# Patient Record
Sex: Male | Born: 1940 | Race: White | Hispanic: No | Smoking: Former smoker
Health system: Southern US, Community
[De-identification: ages and names within clinical notes are randomized; demographics above are authoritative.]

## PROBLEM LIST (undated history)

## (undated) DIAGNOSIS — M8580 Other specified disorders of bone density and structure, unspecified site: Secondary | ICD-10-CM

## (undated) DIAGNOSIS — I10 Essential (primary) hypertension: Secondary | ICD-10-CM

## (undated) DIAGNOSIS — K635 Polyp of colon: Secondary | ICD-10-CM

## (undated) DIAGNOSIS — K219 Gastro-esophageal reflux disease without esophagitis: Secondary | ICD-10-CM

## (undated) DIAGNOSIS — I714 Abdominal aortic aneurysm, without rupture, unspecified: Secondary | ICD-10-CM

## (undated) DIAGNOSIS — T7840XA Allergy, unspecified, initial encounter: Secondary | ICD-10-CM

## (undated) DIAGNOSIS — I2584 Coronary atherosclerosis due to calcified coronary lesion: Secondary | ICD-10-CM

## (undated) DIAGNOSIS — I771 Stricture of artery: Secondary | ICD-10-CM

## (undated) DIAGNOSIS — I2510 Atherosclerotic heart disease of native coronary artery without angina pectoris: Secondary | ICD-10-CM

## (undated) DIAGNOSIS — M1990 Unspecified osteoarthritis, unspecified site: Secondary | ICD-10-CM

## (undated) DIAGNOSIS — L309 Dermatitis, unspecified: Secondary | ICD-10-CM

## (undated) HISTORY — PX: TRANSURETHRAL RESECTION OF PROSTATE: SHX73

## (undated) HISTORY — DX: Dermatitis, unspecified: L30.9

## (undated) HISTORY — DX: Abdominal aortic aneurysm, without rupture, unspecified: I71.40

## (undated) HISTORY — DX: Abdominal aortic aneurysm, without rupture: I71.4

## (undated) HISTORY — DX: Polyp of colon: K63.5

## (undated) HISTORY — DX: Other specified disorders of bone density and structure, unspecified site: M85.80

## (undated) HISTORY — PX: TONSILLECTOMY: SUR1361

## (undated) HISTORY — DX: Gastro-esophageal reflux disease without esophagitis: K21.9

## (undated) HISTORY — DX: Allergy, unspecified, initial encounter: T78.40XA

## (undated) HISTORY — DX: Atherosclerotic heart disease of native coronary artery without angina pectoris: I25.10

## (undated) HISTORY — PX: MASS EXCISION: SHX2000

## (undated) HISTORY — DX: Unspecified osteoarthritis, unspecified site: M19.90

## (undated) HISTORY — DX: Stricture of artery: I77.1

## (undated) HISTORY — PX: CATARACT EXTRACTION: SUR2

## (undated) HISTORY — DX: Essential (primary) hypertension: I10

## (undated) HISTORY — DX: Coronary atherosclerosis due to calcified coronary lesion: I25.84

---

## 2006-06-08 ENCOUNTER — Ambulatory Visit: Payer: Self-pay | Admitting: Gastroenterology

## 2006-06-28 ENCOUNTER — Ambulatory Visit: Payer: Self-pay | Admitting: Gastroenterology

## 2006-06-28 ENCOUNTER — Encounter (INDEPENDENT_AMBULATORY_CARE_PROVIDER_SITE_OTHER): Payer: Self-pay | Admitting: Specialist

## 2009-06-22 ENCOUNTER — Encounter (INDEPENDENT_AMBULATORY_CARE_PROVIDER_SITE_OTHER): Payer: Self-pay | Admitting: *Deleted

## 2010-01-07 ENCOUNTER — Encounter (INDEPENDENT_AMBULATORY_CARE_PROVIDER_SITE_OTHER): Payer: Self-pay | Admitting: *Deleted

## 2010-01-13 ENCOUNTER — Encounter (INDEPENDENT_AMBULATORY_CARE_PROVIDER_SITE_OTHER): Payer: Self-pay

## 2010-01-15 ENCOUNTER — Ambulatory Visit: Payer: Self-pay | Admitting: Gastroenterology

## 2010-01-22 ENCOUNTER — Ambulatory Visit: Payer: Self-pay | Admitting: Gastroenterology

## 2010-01-27 ENCOUNTER — Encounter: Payer: Self-pay | Admitting: Gastroenterology

## 2010-06-20 HISTORY — PX: OTHER SURGICAL HISTORY: SHX169

## 2010-07-20 NOTE — Miscellaneous (Signed)
Summary: Lec previsit  Clinical Lists Changes  Medications: Added new medication of MOVIPREP 100 GM  SOLR (PEG-KCL-NACL-NASULF-NA ASC-C) As per prep instructions. - Signed Rx of MOVIPREP 100 GM  SOLR (PEG-KCL-NACL-NASULF-NA ASC-C) As per prep instructions.;  #1 x 0;  Signed;  Entered by: Ulis Rias RN;  Authorized by: Meryl Dare MD [facility];  Method used: [facility]  [id number]*, [address], [place], [place]  [address], Ph: [phone], Fax: [phone] Observations: Added new observation of NKA: T (01/15/2010 12:54)    Prescriptions: MOVIPREP 100 GM  SOLR (PEG-KCL-NACL-NASULF-NA ASC-C) As per prep instructions.  #1 x 0   Entered by:   Ulis Rias RN   Authorized by:   Meryl Dare MD [facility]   Signed by:   Ulis Rias RN on 01/15/2010   Method used:   [facility]ronically to        [facility]  [phone]* (retail)       [address] [place], [place]  [address]       Ph: [phone]       Fax: [phone]   RxID:   [id number]

## 2010-07-20 NOTE — Procedures (Signed)
Summary: Colonoscopy  Patient: [name] Note: All result statuses are Final unless otherwise noted.  Tests: (1) Colonoscopy (COL)   COL Colonoscopy           DONE     [facility]     [address]     [place], [place]  [address]           COLONOSCOPY PROCEDURE REPORT           PATIENT:  Jeffrey [name], Jeffrey [name]  MR#:  [id number]     BIRTHDATE:  09/17/1940, 68 yrs. old  GENDER:  male     ENDOSCOPIST:  Judie Petit T. 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     MEDICATIONS:   Fentanyl 75 mcg IV, Versed 6 mg IV     DESCRIPTION OF PROCEDURE:   After the risks benefits and     alternatives of the procedure were thoroughly explained, informed     consent was obtained.  Digital rectal exam was performed and     revealed no abnormalities.   The LB PCF-H180AL [id number] endoscope     was introduced through the anus and advanced to the cecum, which     was identified by both the appendix and ileocecal valve, without     limitations.  The quality of the prep was excellent, using     MoviPrep.  The instrument was then slowly withdrawn as the colon     was fully examined.     <<PROCEDUREIMAGES>>     FINDINGS:  A sessile polyp was found in the cecum. It was 5 mm in     size. Polyp was snared without cautery. Retrieval was successful.     A sessile polyp was found in the mid transverse colon. It was 4 mm     in size. The polyp was removed using cold biopsy forceps.  Mild     diverticulosis was found transverse colon to sigmoid colon. This     was otherwise a normal examination of the colon. Retroflexed views     in the rectum revealed no abnormalities.    The time to cecum =     1.75  minutes. The scope was then withdrawn (time =  9.33  min)     from the patient and the procedure  completed.           COMPLICATIONS:  None           ENDOSCOPIC IMPRESSION:     1) 5 mm sessile polyp in the cecum     2) 4 mm sessile polyp in the mid transverse colon     3) Mild diverticulosis transverse colon to sigmoid colon           RECOMMENDATIONS:     1) High fiber diet with liberal     fluid intake     2) Repeat colonoscopy in 5 years     pending pathology review           Malcolm T. Russella Dar, MD, [facility]           n.     eSIGNED:   Venita Lick. Stark at 01/22/2010 10:56 AM  Maikel, [place], [id number]  Note: An exclamation mark (!) indicates a result that was not dispersed into the flowsheet. Document Creation Date: 01/22/2010 10:58 AM _______________________________________________________________________  (1) Order result status: Final Collection or observation date-time: 01/22/2010 10:48 Requested date-time:  Receipt date-time:  Reported date-time:  Referring Physician:   Ordering Physician: Claudette Head [phone]) Specimen Source:  Source: Launa Grill Order Number: [id number] Lab site:   Appended Document: Colonoscopy     Procedures Next Due Date:    Colonoscopy: 01/2015

## 2010-07-20 NOTE — Letter (Signed)
Summary: Patient Notice- Polyp Results  [facility]  [address] [place], [place] [address]   Phone: [phone]  Fax: [phone]        January 27, 2010 MRN: [id number]    [facility] [address] [place], [place]  [address]    Dear Mr. Wimbish,  I am pleased to inform you that the colon polyp(s) removed during your recent colonoscopy was (were) found to be benign (no cancer detected) upon pathologic examination.  I recommend you have a repeat colonoscopy examination in 5 years to look for recurrent polyps, as having colon polyps increases your risk for having recurrent polyps or even colon cancer in the future.  Should you develop new or worsening symptoms of abdominal pain, bowel habit changes or bleeding from the rectum or bowels, please schedule an evaluation with either your primary care physician or with me.  Continue treatment plan as outlined the day of your exam.  Please call us if you are having persistent problems or have questions about your condition that have not been fully answered at this time.  Sincerely,  Meryl Dare MD [facility]  This letter has been electronically signed by your physician.  Appended Document: Patient Notice- Polyp Results letter mailed

## 2010-07-20 NOTE — Letter (Signed)
Summary: Previsit letter  [facility]  [address] [place], [place] [address]   Phone: [phone]  Fax: [phone]       01/07/2010 MRN: [id number]  Jeffrey [name] [address] [place], [place]  [address]  Dear Mr. [name],  Welcome to the Gastroenterology Division at [facility].    You are scheduled to see a nurse for your pre-procedure visit on 01-15-10 at 1:30pm on the 3rd floor at [facility], [address]  We ask that you try to arrive at our office 15 minutes prior to your appointment time to allow for check-in.  Your nurse visit will consist of discussing your medical and surgical history, your immediate family medical history, and your medications.    Please bring a complete list of all your medications or, if you prefer, bring the medication bottles and we will list them.  We will need to be aware of both prescribed and over the counter drugs.  We will need to know exact dosage information as well.  If you are on blood thinners (Coumadin, Plavix, Aggrenox, Ticlid, etc.) please call our office today/prior to your appointment, as we need to consult with your physician about holding your medication.   Please be prepared to read and sign documents such as consent forms, a financial agreement, and acknowledgement forms.  If necessary, and with your consent, a friend or relative is welcome to sit-in on the nurse visit with you.  Please bring your insurance card so that we may make a copy of it.  If your insurance requires a referral to see a specialist, please bring your referral form from your primary care physician.  No co-pay is required for this nurse visit.     If you cannot keep your appointment, please call [phone] to cancel or reschedule prior to your appointment date.  This allows [place] the opportunity to schedule an appointment for another patient in need of care.    Thank you for choosing [facility] for your medical needs.  We  appreciate the opportunity to care for you.  Please visit [place] at our website  to learn more about our practice.                     Sincerely.                                                                                                                   The Gastroenterology Division

## 2010-07-20 NOTE — Letter (Signed)
Summary: [facility] Instructions  [facility]  [address] [place], [place] [address]   Phone: [phone]  Fax: [phone]       JASMON GRAFFAM    [birth year]    MRN: [id number]        Procedure Day /Date: Friday 01-22-10     Arrival Time: 10:00 a.m.     Procedure Time: 11:00 a.m.     Location of Procedure:                    _x_  [facility] (4th Floor)   PREPARATION FOR COLONOSCOPY WITH MOVIPREP   Starting 5 days prior to your procedure  01-17-10 do not eat nuts, seeds, popcorn, corn, beans, peas,  salads, or any raw vegetables.  Do not take any fiber supplements (e.g. Metamucil, Citrucel, and Benefiber).  THE DAY BEFORE YOUR PROCEDURE         DATE:  01-21-10  DAY: Thursday  1.  Drink clear liquids the entire day-NO SOLID FOOD  2.  Do not drink anything colored red or purple.  Avoid juices with pulp.  No orange juice.  3.  Drink at least 64 oz. (8 glasses) of fluid/clear liquids during the day to prevent dehydration and help the prep work efficiently.  CLEAR LIQUIDS INCLUDE: Water Jello Ice Popsicles Tea (sugar ok, no milk/cream) Powdered fruit flavored drinks Coffee (sugar ok, no milk/cream) Gatorade Juice: apple, white grape, white cranberry  Lemonade Clear bullion, consomm, broth Carbonated beverages (any kind) Strained chicken noodle soup Hard Candy                             4.  In the morning, mix first dose of MoviPrep solution:    Empty 1 Pouch A and 1 Pouch B into the disposable container    Add lukewarm drinking water to the top line of the container. Mix to dissolve    Refrigerate (mixed solution should be used within 24 hrs)  5.  Begin drinking the prep at 5:00 p.m. The MoviPrep container is divided by 4 marks.   Every 15 minutes drink the solution down to the next mark (approximately 8 oz) until the full liter is complete.   6.  Follow completed prep with 16 oz of clear liquid of your choice (Nothing red or purple).   Continue to drink clear liquids until bedtime.  7.  Before going to bed, mix second dose of MoviPrep solution:    Empty 1 Pouch A and 1 Pouch B into the disposable container    Add lukewarm drinking water to the top line of the container. Mix to dissolve    Refrigerate  THE DAY OF YOUR PROCEDURE      DATE:  01-22-10  DAY: Friday  Beginning at  6:00 a.m. (5 hours before procedure):         1. Every 15 minutes, drink the solution down to the next mark (approx 8 oz) until the full liter is complete.  2. Follow completed prep with 16 oz. of clear liquid of your choice.    3. You may drink clear liquids until  9:00 a.m. (2 HOURS BEFORE PROCEDURE).   MEDICATION INSTRUCTIONS  Unless otherwise instructed, you should take regular prescription medications with a small sip of water   as early as possible the morning of your procedure.         OTHER INSTRUCTIONS  You will need  a responsible adult at least 70 years of age to accompany you and drive you home.   This person must remain in the waiting room during your procedure.  Wear loose fitting clothing that is easily removed.  Leave jewelry and other valuables at home.  However, you may wish to bring a book to read or  an iPod/MP3 player to listen to music as you wait for your procedure to start.  Remove all body piercing jewelry and leave at home.  Total time from sign-in until discharge is approximately 2-3 hours.  You should go home directly after your procedure and rest.  You can resume normal activities the  day after your procedure.  The day of your procedure you should not:   Drive   Make legal decisions   Operate machinery   Drink alcohol   Return to work  You will receive specific instructions about eating, activities and medications before you leave.    The above instructions have been reviewed and explained to me by   Ulis Rias RN  January 15, 2010 2:04 PM _    I fully understand and can verbalize  these instructions _____________________________ Date _________

## 2010-07-20 NOTE — Letter (Signed)
Summary: Colonoscopy Letter  [facility]  [address] [place], [place] [address]   Phone: [phone]  Fax: [phone]      June 22, 2009 MRN: [id number]   Jeffrey [name] [address] [place], [place]  [address]   Dear [name],   According to your medical record, it is time for you to schedule a Colonoscopy. The American Cancer Society recommends this procedure as a method to detect early colon cancer. Patients with a family history of colon cancer, or a personal history of colon polyps or inflammatory bowel disease are at increased risk.  This letter has beeen generated based on the recommendations made at the time of your procedure. If you feel that in your particular situation this may no longer apply, please contact our office.  Please call our office at [phone] to schedule this appointment or to update your records at your earliest convenience.  Thank you for cooperating with [place] to provide you with the very best care possible.   Sincerely,  Iva Boop, M.D.  [facility] Gastroenterology Division [phone]

## 2015-02-16 ENCOUNTER — Encounter: Payer: Self-pay | Admitting: Gastroenterology

## 2015-07-06 DIAGNOSIS — E785 Hyperlipidemia, unspecified: Secondary | ICD-10-CM | POA: Insufficient documentation

## 2015-07-06 DIAGNOSIS — I10 Essential (primary) hypertension: Secondary | ICD-10-CM | POA: Insufficient documentation

## 2015-07-06 DIAGNOSIS — I714 Abdominal aortic aneurysm, without rupture, unspecified: Secondary | ICD-10-CM | POA: Insufficient documentation

## 2015-07-06 DIAGNOSIS — C4491 Basal cell carcinoma of skin, unspecified: Secondary | ICD-10-CM | POA: Insufficient documentation

## 2015-07-06 DIAGNOSIS — Z86010 Personal history of colonic polyps: Secondary | ICD-10-CM | POA: Insufficient documentation

## 2015-07-17 ENCOUNTER — Other Ambulatory Visit: Payer: Self-pay | Admitting: *Deleted

## 2015-08-03 DIAGNOSIS — L853 Xerosis cutis: Secondary | ICD-10-CM | POA: Diagnosis not present

## 2015-08-03 DIAGNOSIS — L570 Actinic keratosis: Secondary | ICD-10-CM | POA: Diagnosis not present

## 2015-08-03 DIAGNOSIS — Z85828 Personal history of other malignant neoplasm of skin: Secondary | ICD-10-CM | POA: Diagnosis not present

## 2015-08-03 DIAGNOSIS — Z08 Encounter for follow-up examination after completed treatment for malignant neoplasm: Secondary | ICD-10-CM | POA: Diagnosis not present

## 2015-08-07 ENCOUNTER — Encounter: Payer: Self-pay | Admitting: Vascular Surgery

## 2015-08-18 ENCOUNTER — Ambulatory Visit (INDEPENDENT_AMBULATORY_CARE_PROVIDER_SITE_OTHER): Payer: Medicare Other | Admitting: Vascular Surgery

## 2015-08-18 ENCOUNTER — Encounter: Payer: Self-pay | Admitting: Vascular Surgery

## 2015-08-18 VITALS — BP 145/95 | HR 56 | Temp 96.8°F | Resp 16 | Ht 70.0 in | Wt 208.0 lb

## 2015-08-18 DIAGNOSIS — I714 Abdominal aortic aneurysm, without rupture, unspecified: Secondary | ICD-10-CM

## 2015-08-18 NOTE — Progress Notes (Signed)
[facility]ist [facility]  Patient name: Jeffrey [name] MRN: [id number] DOB: 1941-06-19 Sex: male  REASON FOR CONSULT:  followup of abdominal aortic aneurysm  HPI: Jeffrey [name] is a 75 y.o. male, who is  Seen today for evaluation of abdominal aortic aneurysm. He is a very pleasant 75 year old gentleman who is just relocated to [place] area. He was in [facility]. He had the diagnosis of infrarenal abdominal aortic aneurysm made around 2012 with the chest x-ray. Has been followed with serial ultrasounds since that time. I have his study from April 2016 and October 2016. His April 2016 revealed maximal diameter of 4.4 cm in October 2016 maximal diameter 4.6 cm. He has no symptoms referable to his aneurysm. Has no history of cardiac disease. His prior studies suggested some left iliac artery stenosis but he has no claudication symptoms and is very active walking program. Fortunately quit smoking years ago in 1992  Past Medical History  Diagnosis Date  . AAA (abdominal aortic aneurysm) ([facility])   . Arthritis   . Osteopenia   . Eczematous dermatitis   . Coronary artery calcification   . Colon polyps   . Iliac artery stenosis, left ([facility])     History reviewed. No pertinent family history.  SOCIAL HISTORY: Social History   Social History  . Marital Status: Divorced    Spouse Name: N/A  . Number of Children: N/A  . Years of Education: N/A   Occupational History  . Not on file.   Social History Main Topics  . Smoking status: Former Smoker    Types: Cigarettes    Quit date: 08/17/1990  . Smokeless tobacco: Never Used  . Alcohol Use: 0.0 oz/week    0 Standard drinks or equivalent per week  . Drug Use: No  . Sexual Activity: Not on file   Other Topics Concern  . Not on file   Social History Narrative  . No narrative on file    Allergies  Allergen Reactions  . Ketotifen Fumarate Swelling    Eye swelling  . Triamcinolone Rash    HIVES     Current Outpatient Prescriptions  Medication Sig Dispense Refill  . alendronate (FOSAMAX) 70 MG tablet Take by mouth.    . Ascorbic Acid (VITAMIN C) 1000 MG tablet Take 1,000 mg by mouth.    Marland Kitchen aspirin EC 81 MG tablet Take 81 mg by mouth.    Marland Kitchen atorvastatin (LIPITOR) 20 MG tablet Take by mouth.    . Calcium Carb-Cholecalciferol (CALCIUM 500/D) 500-400 MG-UNIT CHEW Chew by mouth.    . Cyanocobalamin (RA VITAMIN B-12 TR) 1000 MCG TBCR Take by mouth.    . fexofenadine (ALLEGRA) 180 MG tablet Take 180 mg by mouth.    . Glucosamine-Chondroitin 500-400 MG CAPS Take 1,000 mg by mouth.    Marland Kitchen ibuprofen (ADVIL,MOTRIN) 200 MG tablet Take 200 mg by mouth.    . losartan (COZAAR) 25 MG tablet Take by mouth.    . Multiple Vitamins-Minerals (CENTRUM ADULTS PO) Take by mouth daily.    . Omega-3 1000 MG CAPS Take 1 g by mouth.    . Prenatal Vit-Fe Fumarate-FA (M-VIT) tablet Take by mouth. Reported on 08/18/2015     No current facility-administered medications for this visit.    REVIEW OF SYSTEMS:  [X]  denotes positive finding, [ ]  denotes negative finding Cardiac  Comments:  Chest pain or chest pressure:    Shortness of breath upon exertion:    Short of breath when lying  flat:    Irregular heart rhythm:        Vascular    Pain in calf, thigh, or hip brought on by ambulation:    Pain in feet at night that wakes you up from your sleep:     Blood clot in your veins: ?x   Leg swelling:         Pulmonary    Oxygen at home:    Productive cough:     Wheezing:         Neurologic    Sudden weakness in arms or legs:     Sudden numbness in arms or legs:     Sudden onset of difficulty speaking or slurred speech:    Temporary loss of vision in one eye:     Problems with dizziness:         Gastrointestinal    Blood in stool:     Vomited blood:         Genitourinary    Burning when urinating:     Blood in urine:        Psychiatric    Major depression:         Hematologic    Bleeding  problems:    Problems with blood clotting too easily:        Skin    Rashes or ulcers: x       Constitutional    Fever or chills:      PHYSICAL EXAM: Filed Vitals:   08/18/15 1311 08/18/15 1315  BP: 154/88 145/95  Pulse: 56 56  Temp: 96.8 F (36 C)   Resp: 16   Height: 5\' 10"  (1.778 m)   Weight: 208 lb (94.348 kg)   SpO2: 99%     GENERAL: The patient is a well-nourished male, in no acute distress. The vital signs are documented above. CARDIAC: There is a regular rate and rhythm.  VASCULAR:  Carotid arteries without bruits bilaterally. 2+ radial 2+ femoral and 2+ posterior tibial pulses. I do not feel popliteal artery aneurysms. PULMONARY: There is good air exchange bilaterally without wheezing or rales. ABDOMEN: Soft and non-tender with normal pitched bowel sounds.  Moderate obesity. No aneurysm palpable MUSCULOSKELETAL: There are no major deformities or cyanosis. NEUROLOGIC: No focal weakness or paresthesias are detected. SKIN: There are no ulcers or rashes noted. PSYCHIATRIC: The patient has a normal affect.  DATA:   reviewed his duplex ultrasound from April and October 2016 with a maximal diameter and October a 4.6 cm AAA  MEDICAL ISSUES:  discussed the significance of this with patient. Explain symptoms of leaking aneurysm. Also explained the recommendation for ongoing 6 month surveillance follow-up. Explained that if he reached the 5-5 and half centimeter range would consider elective repair. We will see him again in 2 months with ultrasound follow-up. He knows  Call 911 and report immediately to [facility] hospital should he have any symptoms   Jeania Nater [facility] [employer]: [phone]

## 2015-08-18 NOTE — Progress Notes (Signed)
Filed Vitals:   08/18/15 1311 08/18/15 1315  BP: 154/88 145/95  Pulse: 56 56  Temp: 96.8 F (36 C)   Resp: 16   Height: 5\' 10"  (1.778 m)   Weight: 208 lb (94.348 kg)   SpO2: 99%

## 2015-08-18 NOTE — Addendum Note (Signed)
Addended by: Dorthula Rue L on: 08/18/2015 02:58 PM   Modules accepted: Orders

## 2015-08-20 ENCOUNTER — Encounter: Payer: Self-pay | Admitting: Internal Medicine

## 2015-08-31 DIAGNOSIS — I10 Essential (primary) hypertension: Secondary | ICD-10-CM | POA: Diagnosis not present

## 2015-08-31 DIAGNOSIS — E785 Hyperlipidemia, unspecified: Secondary | ICD-10-CM | POA: Diagnosis not present

## 2015-08-31 DIAGNOSIS — I714 Abdominal aortic aneurysm, without rupture: Secondary | ICD-10-CM | POA: Diagnosis not present

## 2015-09-14 DIAGNOSIS — K621 Rectal polyp: Secondary | ICD-10-CM | POA: Diagnosis not present

## 2015-09-14 DIAGNOSIS — K5730 Diverticulosis of large intestine without perforation or abscess without bleeding: Secondary | ICD-10-CM | POA: Diagnosis not present

## 2015-09-14 DIAGNOSIS — K649 Unspecified hemorrhoids: Secondary | ICD-10-CM | POA: Diagnosis not present

## 2015-09-14 DIAGNOSIS — D128 Benign neoplasm of rectum: Secondary | ICD-10-CM | POA: Diagnosis not present

## 2015-09-14 DIAGNOSIS — D122 Benign neoplasm of ascending colon: Secondary | ICD-10-CM | POA: Diagnosis not present

## 2015-09-14 DIAGNOSIS — Z86010 Personal history of colonic polyps: Secondary | ICD-10-CM | POA: Diagnosis not present

## 2015-09-30 DIAGNOSIS — Z86010 Personal history of colonic polyps: Secondary | ICD-10-CM | POA: Diagnosis not present

## 2015-09-30 DIAGNOSIS — E785 Hyperlipidemia, unspecified: Secondary | ICD-10-CM | POA: Diagnosis not present

## 2015-09-30 DIAGNOSIS — I10 Essential (primary) hypertension: Secondary | ICD-10-CM | POA: Diagnosis not present

## 2015-09-30 DIAGNOSIS — I714 Abdominal aortic aneurysm, without rupture: Secondary | ICD-10-CM | POA: Diagnosis not present

## 2015-10-22 ENCOUNTER — Encounter: Payer: Self-pay | Admitting: Vascular Surgery

## 2015-10-27 ENCOUNTER — Encounter: Payer: Self-pay | Admitting: Vascular Surgery

## 2015-10-27 ENCOUNTER — Ambulatory Visit (HOSPITAL_COMMUNITY)
Admission: RE | Admit: 2015-10-27 | Discharge: 2015-10-27 | Disposition: A | Discharge status: Home / Self Care | Payer: Medicare Other | Source: Ambulatory Visit | Attending: Vascular Surgery | Admitting: Vascular Surgery

## 2015-10-27 ENCOUNTER — Ambulatory Visit (INDEPENDENT_AMBULATORY_CARE_PROVIDER_SITE_OTHER): Payer: Medicare Other | Admitting: Vascular Surgery

## 2015-10-27 VITALS — BP 166/104 | HR 53 | Temp 97.1°F | Resp 18 | Ht 70.0 in | Wt 210.5 lb

## 2015-10-27 DIAGNOSIS — I714 Abdominal aortic aneurysm, without rupture, unspecified: Secondary | ICD-10-CM

## 2015-10-27 NOTE — Progress Notes (Signed)
Filed Vitals:   10/27/15 1105 10/27/15 1109  BP: 167/96 166/104  Pulse: 53   Temp: 97.1 F (36.2 C)   TempSrc: Oral   Resp: 18   Height: 5\' 10"  (1.778 m)   Weight: 210 lb 8 oz (95.482 kg)   SpO2: 100%

## 2015-10-27 NOTE — Progress Notes (Signed)
**Note Jeffrey-Identified via Obfuscation** Vascular and Vein Specialist of [facility]  Patient name: Jeffrey [name] MRN: [id number] DOB: [birth year] Sex: male  REASON FOR VISIT: Follow-up abdominal aortic aneurysm  HPI: Jeffrey [name] is a 75 y.o. male here today for follow-up of asymptomatic abdominal aortic aneurysm. Seen him several months ago. He had been following his aneurysm and [facility] prior to moving to this area. His last ultrasound was October 2016 which time his aneurysm was 4.6 cm in diameter. He is here today for repeat ultrasound. He has no change in his medical status and has no symptoms to his aneurysm  Past Medical History  Diagnosis Date  . AAA (abdominal aortic aneurysm) ([place])   . Arthritis   . Osteopenia   . Eczematous dermatitis   . Coronary artery calcification   . Colon polyps   . Iliac artery stenosis, left (HCC)     History reviewed. No pertinent family history.  SOCIAL HISTORY: Social History  Substance Use Topics  . Smoking status: Former Smoker    Types: Cigarettes    Quit date: 08/17/1990  . Smokeless tobacco: Never Used  . Alcohol Use: 0.0 oz/week    0 Standard drinks or equivalent per week    Allergies  Allergen Reactions  . Ketotifen Fumarate Swelling    Eye swelling  . Triamcinolone Rash    HIVES    Current Outpatient Prescriptions  Medication Sig Dispense Refill  . alendronate (FOSAMAX) 70 MG tablet Take by mouth.    . Ascorbic Acid (VITAMIN C) 1000 MG tablet Take 1,000 mg by mouth.    Marland Kitchen aspirin EC 81 MG tablet Take 81 mg by mouth.    Marland Kitchen atorvastatin (LIPITOR) 20 MG tablet Take by mouth.    . Calcium Carb-Cholecalciferol (CALCIUM 500/D) 500-400 MG-UNIT CHEW Chew by mouth.    . Cyanocobalamin (RA VITAMIN B-12 TR) 1000 MCG TBCR Take by mouth.    . fexofenadine (ALLEGRA) 180 MG tablet Take 180 mg by mouth.    . Glucosamine-Chondroitin 500-400 MG CAPS Take 1,000 mg by mouth.    Marland Kitchen ibuprofen (ADVIL,MOTRIN) 200 MG tablet Take 200 mg by mouth.    . losartan  (COZAAR) 25 MG tablet Take by mouth.    . Multiple Vitamins-Minerals (CENTRUM ADULTS PO) Take by mouth daily.    . Omega-3 1000 MG CAPS Take 1 g by mouth.    . Prenatal Vit-Fe Fumarate-FA (M-VIT) tablet Take by mouth. Reported on 08/18/2015     No current facility-administered medications for this visit.    REVIEW OF SYSTEMS:  [X]  denotes positive finding, [ ]  denotes negative finding Cardiac  Comments:  Chest pain or chest pressure:    Shortness of breath upon exertion:    Short of breath when lying flat:    Irregular heart rhythm:        Vascular    Pain in calf, thigh, or hip brought on by ambulation:    Pain in feet at night that wakes you up from your sleep:     Blood clot in your veins:    Leg swelling:         Pulmonary    Oxygen at home:    Productive cough:     Wheezing:         Neurologic    Sudden weakness in arms or legs:     Sudden numbness in arms or legs:     Sudden onset of difficulty speaking or slurred speech:    Temporary loss of vision in  one eye:     Problems with dizziness:         Gastrointestinal    Blood in stool:     Vomited blood:         Genitourinary    Burning when urinating:     Blood in urine:        Psychiatric    Major depression:         Hematologic    Bleeding problems:    Problems with blood clotting too easily:        Skin    Rashes or ulcers: x       Constitutional    Fever or chills:      PHYSICAL EXAM: Filed Vitals:   10/27/15 1105 10/27/15 1109  BP: 167/96 166/104  Pulse: 53   Temp: 97.1 F (36.2 C)   TempSrc: Oral   Resp: 18   Height: 5\' 10"  (1.778 m)   Weight: 210 lb 8 oz (95.482 kg)   SpO2: 100%     GENERAL: The patient is a well-nourished male, in no acute distress. The vital signs are documented above. CARDIAC: There is a regular rate and rhythm.  VASCULAR: 2+ radial 2+ posterior tibial pulses bilaterally. Palpable popliteal pulses with no evidence of popliteal or femoral artery aneurysms PULMONARY:  There is good air exchange bilaterally without wheezing or rales. ABDOMEN: Soft and non-tender with normal pitched bowel sounds. Do not palpate an aneurysm MUSCULOSKELETAL: There are no major deformities or cyanosis. NEUROLOGIC: No focal weakness or paresthesias are detected. SKIN: There are no ulcers or rashes noted. PSYCHIATRIC: The patient has a normal affect.  DATA:  Ultrasound today reveals maximal diameter 5.0 cm of his infrarenal aorta  MEDICAL ISSUES: Had long discussion with the patient. Explained that he has had typical slow expansion of his aneurysm size. Explained that our current recommendation would be repair in the 5.5 cm range. Explained that at that point his annual risk of rupture would be approximate 5%. Explained that we could entertain the operative repair currently but feel comfortable waiting for an additional 6 months with repeat study. He is comfortable with this as well. We will see him in 6 months with CT angiogram to further evaluate his aneurysm size and determine if he would be a stent graft candidate. I again reviewed symptoms of aneurysmal disease. He will call [phone] and repeat report immediately to the emergency room should this    Mychal Durio [facility] of [place]: [phone]

## 2015-11-13 NOTE — Addendum Note (Signed)
Addended by: Thresa Ross C on: 11/13/2015 12:06 PM   Modules accepted: Orders

## 2016-03-31 DIAGNOSIS — I714 Abdominal aortic aneurysm, without rupture: Secondary | ICD-10-CM | POA: Diagnosis not present

## 2016-03-31 DIAGNOSIS — I2510 Atherosclerotic heart disease of native coronary artery without angina pectoris: Secondary | ICD-10-CM | POA: Diagnosis not present

## 2016-03-31 DIAGNOSIS — I10 Essential (primary) hypertension: Secondary | ICD-10-CM | POA: Diagnosis not present

## 2016-03-31 DIAGNOSIS — E785 Hyperlipidemia, unspecified: Secondary | ICD-10-CM | POA: Diagnosis not present

## 2016-04-12 ENCOUNTER — Encounter: Payer: Self-pay | Admitting: Vascular Surgery

## 2016-04-26 ENCOUNTER — Ambulatory Visit
Admission: RE | Admit: 2016-04-26 | Discharge: 2016-04-26 | Disposition: A | Discharge status: Home / Self Care | Payer: Medicare Other | Source: Ambulatory Visit | Attending: Vascular Surgery | Admitting: Vascular Surgery

## 2016-04-26 DIAGNOSIS — I714 Abdominal aortic aneurysm, without rupture, unspecified: Secondary | ICD-10-CM

## 2016-04-26 MED ORDER — IOPAMIDOL (ISOVUE-370) INJECTION 76%
75.0000 mL | Freq: Once | INTRAVENOUS | Status: AC | PRN
  Administered 2016-04-26: 75 mL via INTRAVENOUS

## 2016-05-03 ENCOUNTER — Encounter: Payer: Self-pay | Admitting: Vascular Surgery

## 2016-05-03 ENCOUNTER — Ambulatory Visit: Payer: Medicare Other | Admitting: Vascular Surgery

## 2016-05-04 ENCOUNTER — Encounter: Payer: Self-pay | Admitting: Vascular Surgery

## 2016-05-10 ENCOUNTER — Encounter: Payer: Self-pay | Admitting: Vascular Surgery

## 2016-05-10 ENCOUNTER — Ambulatory Visit (INDEPENDENT_AMBULATORY_CARE_PROVIDER_SITE_OTHER): Payer: Medicare Other | Admitting: Vascular Surgery

## 2016-05-10 VITALS — BP 140/82 | HR 56 | Temp 97.2°F | Resp 16 | Ht 70.0 in | Wt 212.0 lb

## 2016-05-10 DIAGNOSIS — I714 Abdominal aortic aneurysm, without rupture, unspecified: Secondary | ICD-10-CM

## 2016-05-10 NOTE — Progress Notes (Signed)
HISTORY AND PHYSICAL     CC:  F/u AAA with CT Referring Provider:  Red Christians, MD  HPI: This is a 75 y.o. male who moved to [place] from [place].  Prior to his move, his aneurysm was being followed there.  He saw Dr. Donnetta Hutching in May 2017 at which time the aneurysm measured 5.0cm. At the last visit, he was scheduled for a baseline CTA in 6 months.  He states he has not had any abdominal pain or any new back pain.  His medical status is unchanged from his previous visit.    He is on a statin for cholesterol management.  He takes a daily aspirin.  He is on an ARB for blood pressure control.   Past Medical History:  Diagnosis Date  . AAA (abdominal aortic aneurysm) ([place])   . Arthritis   . Colon polyps   . Coronary artery calcification   . Eczematous dermatitis   . Iliac artery stenosis, left ([place])   . Osteopenia     Past Surgical History:  Procedure Laterality Date  . CATARACT EXTRACTION Bilateral   . TONSILLECTOMY    . TRANSURETHRAL RESECTION OF PROSTATE      Allergies  Allergen Reactions  . Ketotifen Fumarate Swelling    Eye swelling  . Triamcinolone Rash    HIVES    Current Outpatient Prescriptions  Medication Sig Dispense Refill  . alendronate (FOSAMAX) 70 MG tablet Take by mouth.    . Ascorbic Acid (VITAMIN C) 1000 MG tablet Take 1,000 mg by mouth.    Marland Kitchen aspirin EC 81 MG tablet Take 81 mg by mouth.    Marland Kitchen atorvastatin (LIPITOR) 20 MG tablet Take by mouth.    . Calcium Carb-Cholecalciferol (CALCIUM 500/D) 500-400 MG-UNIT CHEW Chew by mouth.    . Cyanocobalamin (RA VITAMIN B-12 TR) 1000 MCG TBCR Take by mouth.    . fexofenadine (ALLEGRA) 180 MG tablet Take 180 mg by mouth.    . Glucosamine-Chondroitin 500-400 MG CAPS Take 1,000 mg by mouth.    Marland Kitchen ibuprofen (ADVIL,MOTRIN) 200 MG tablet Take 200 mg by mouth.    . losartan (COZAAR) 25 MG tablet Take by mouth.    . Multiple Vitamins-Minerals (CENTRUM ADULTS PO) Take by mouth daily.    . Omega-3 1000 MG CAPS Take 1  g by mouth.     No current facility-administered medications for this visit.     No family history on file.  Social History   Social History  . Marital status: Divorced    Spouse name: N/A  . Number of children: N/A  . Years of education: N/A   Occupational History  . Not on file.   Social History Main Topics  . Smoking status: Former Smoker    Types: Cigarettes    Quit date: 08/17/1990  . Smokeless tobacco: Never Used  . Alcohol use 0.0 oz/week  . Drug use: No  . Sexual activity: Not on file   Other Topics Concern  . Not on file   Social History Narrative  . No narrative on file     REVIEW OF SYSTEMS:   [X]  denotes positive finding, [ ]  denotes negative finding Cardiac  Comments:  Chest pain or chest pressure:    Shortness of breath upon exertion:    Short of breath when lying flat:    Irregular heart rhythm:        Vascular    Pain in calf, thigh, or hip brought on by ambulation:  Pain in feet at night that wakes you up from your sleep:     Blood clot in your veins:    Leg swelling:         Pulmonary    Oxygen at home:    Productive cough:     Wheezing:         Neurologic    Sudden weakness in arms or legs:     Sudden numbness in arms or legs:     Sudden onset of difficulty speaking or slurred speech:    Temporary loss of vision in one eye:     Problems with dizziness:         Gastrointestinal    Blood in stool:     Vomited blood:         Genitourinary    Burning when urinating:     Blood in urine:        Psychiatric    Major depression:         Hematologic    Bleeding problems:    Problems with blood clotting too easily:        Skin    Rashes or ulcers:        Constitutional    Fever or chills:      PHYSICAL EXAMINATION:  Vitals:   05/10/16 1524  BP: 140/82  Pulse: (!) 56  Resp: 16  Temp: 97.2 F (36.2 C)   Body mass index is 30.42 kg/m.  General:  WDWN in NAD; vital signs documented above Gait: Not observed HENT:  WNL, normocephalic Pulmonary: normal non-labored breathing , without Rales, rhonchi,  wheezing Cardiac: regular HR, without  Murmurs, rubs or gallops; without carotid bruits Abdomen: soft, NT, no masses Skin: without rashes Vascular Exam/Pulses:  Right Left  Radial 2+ (normal) 2+ (normal)  Ulnar Unable to palpate  Unable to palpate   Femoral 2+ (normal) 2+ (normal)  DP 2+ (normal) 2+ (normal)  PT Unable to palpate  Unable to palpate    Extremities: without ischemic changes, without Gangrene , without cellulitis; without open wounds;  Musculoskeletal: no muscle wasting or atrophy  Neurologic: A&O X 3;  No focal weakness or paresthesias are detected Psychiatric:  The pt has Normal affect.   Non-Invasive Vascular Imaging:   CTA 04/26/16: IMPRESSION: VASCULAR  1. Fusiform bilobed infrarenal abdominal aortic aneurysm measuring up to 4.9 cm in maximal diameter. 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 Aortic aneurysm NOS (ICD10-I71.9); Aortic Atherosclerosis (ICD10-170.0). Recommend followup by abdomen and pelvis CTA in 6 months, and vascular surgery referral/consultation if not already obtained. This recommendation follows ACR consensus guidelines: White Paper of the ACR Incidental Findings Committee II on Vascular Findings. J Am Coll Radiol 2013; 10:789-794. 2. Variant hepatic arterial anatomy incidentally noted.  NON-VASCULAR  1. No acute abnormality within the abdomen or pelvis. 2. Colonic diverticular disease without CT evidence of active inflammation. 3. Incidentally noted duodenum diverticulum at the junction of the D3 and D4 segments.   Pt meds includes: Statin:  Yes.   Beta Blocker:  No. Aspirin:  Yes.   ACEI:  No. ARB:  Yes.   CCB use:  No Other Antiplatelet/Anticoagulant:  No   ASSESSMENT/PLAN:: 75 y.o. male with asymptomatic AAA   -the size of the aneurysm is essentially unchanged.  He is asymptomatic.   -Dr. Donnetta Hutching  discussed with the pt that with the size of the aneurysm, his risk of rupture is ~ 5%. -by CT scan, the pt is not an  optimal candidate for endovascular repair of his aneurysm and would most likely require an open repair.  This was discussed with the pt in detail by Dr. Donnetta Hutching.   -the pt understands that if he gets sharp abdominal or back pain that is sudden, he should call 911 and be transferred to [facility] immediately.   -we will see him again in 6 months with an ultrasound.  He will call us sooner if he needs.   Leontine Locket, PA-C [facility] [phone]  Clinic MD:  Pt seen and examined in conjunction with Dr. Donnetta Hutching..  I have examined the patient, reviewed and agree with above. Patient has a borderline length of aortic neck below his lowest renal which is left renal for stent grafting. Would not recommend treatment at this time anyway with is 4.9 cm aneurysm. Discussed this in great deal with patient. He does have a lot of questions regarding his options and treatment and all these were answered to his satisfaction. We will see him again in 6 months with ultrasound follow-up  Curt Jews, MD 05/10/2016 4:59 PM

## 2016-05-11 NOTE — Addendum Note (Signed)
Addended by: Lianne Cure A on: 05/11/2016 02:33 PM   Modules accepted: Orders

## 2016-08-04 DIAGNOSIS — D485 Neoplasm of uncertain behavior of skin: Secondary | ICD-10-CM | POA: Diagnosis not present

## 2016-08-04 DIAGNOSIS — L820 Inflamed seborrheic keratosis: Secondary | ICD-10-CM | POA: Diagnosis not present

## 2016-08-04 DIAGNOSIS — L570 Actinic keratosis: Secondary | ICD-10-CM | POA: Diagnosis not present

## 2016-08-04 DIAGNOSIS — L821 Other seborrheic keratosis: Secondary | ICD-10-CM | POA: Diagnosis not present

## 2016-09-29 DIAGNOSIS — I10 Essential (primary) hypertension: Secondary | ICD-10-CM | POA: Diagnosis not present

## 2016-09-29 DIAGNOSIS — E785 Hyperlipidemia, unspecified: Secondary | ICD-10-CM | POA: Diagnosis not present

## 2016-09-29 DIAGNOSIS — I714 Abdominal aortic aneurysm, without rupture: Secondary | ICD-10-CM | POA: Diagnosis not present

## 2016-11-04 ENCOUNTER — Encounter: Payer: Self-pay | Admitting: Vascular Surgery

## 2016-11-15 ENCOUNTER — Ambulatory Visit (HOSPITAL_COMMUNITY)
Admission: RE | Admit: 2016-11-15 | Discharge: 2016-11-15 | Disposition: A | Discharge status: Home / Self Care | Payer: Medicare Other | Source: Ambulatory Visit | Attending: Vascular Surgery | Admitting: Vascular Surgery

## 2016-11-15 ENCOUNTER — Encounter: Payer: Self-pay | Admitting: Vascular Surgery

## 2016-11-15 ENCOUNTER — Ambulatory Visit (INDEPENDENT_AMBULATORY_CARE_PROVIDER_SITE_OTHER): Payer: Medicare Other | Admitting: Vascular Surgery

## 2016-11-15 VITALS — BP 152/90 | HR 97 | Temp 97.0°F | Resp 16 | Ht 70.0 in | Wt 214.0 lb

## 2016-11-15 DIAGNOSIS — I714 Abdominal aortic aneurysm, without rupture, unspecified: Secondary | ICD-10-CM

## 2016-11-15 NOTE — Progress Notes (Signed)
Vascular and Vein Specialist of [facility]  Patient name: Jeffrey [name] MRN: [id number] DOB: Oct 29, 1940 Sex: male  REASON FOR VISIT: Follow-up abdominal aortic aneurysm  HPI: Jeffrey [name] is a 76 y.o. male here today for follow-up. He continues to be in good health. He has had no new major medical difficulties. Specifically no cardiac issues and no symptoms referable to his aneurysm.  Past Medical History:  Diagnosis Date  . AAA (abdominal aortic aneurysm) ([place])   . Arthritis   . Colon polyps   . Coronary artery calcification   . Eczematous dermatitis   . Iliac artery stenosis, left ([place])   . Osteopenia     History reviewed. No pertinent family history.  SOCIAL HISTORY: Social History  Substance Use Topics  . Smoking status: Former Smoker    Types: Cigarettes    Quit date: 08/17/1990  . Smokeless tobacco: Never Used  . Alcohol use 0.0 oz/week    Allergies  Allergen Reactions  . Ketotifen Fumarate Swelling    Eye swelling  . Triamcinolone Rash    HIVES    Current Outpatient Prescriptions  Medication Sig Dispense Refill  . alendronate (FOSAMAX) 70 MG tablet Take by mouth.    . Ascorbic Acid (VITAMIN C) 1000 MG tablet Take 1,000 mg by mouth.    Marland Kitchen aspirin EC 81 MG tablet Take 81 mg by mouth.    Marland Kitchen atorvastatin (LIPITOR) 20 MG tablet Take by mouth.    . Calcium Carb-Cholecalciferol (CALCIUM 500/D) 500-400 MG-UNIT CHEW Chew by mouth.    . Cyanocobalamin (RA VITAMIN B-12 TR) 1000 MCG TBCR Take by mouth.    . fexofenadine (ALLEGRA) 180 MG tablet Take 180 mg by mouth.    . Glucosamine-Chondroitin 500-400 MG CAPS Take 1,000 mg by mouth.    Marland Kitchen ibuprofen (ADVIL,MOTRIN) 200 MG tablet Take 200 mg by mouth.    . losartan (COZAAR) 25 MG tablet Take by mouth.    . Multiple Vitamins-Minerals (CENTRUM ADULTS PO) Take by mouth daily.    . Omega-3 1000 MG CAPS Take 1 g by mouth.     No current facility-administered medications for this visit.      REVIEW OF SYSTEMS:  [X]  denotes positive finding, [ ]  denotes negative finding Cardiac  Comments:  Chest pain or chest pressure:    Shortness of breath upon exertion:    Short of breath when lying flat:    Irregular heart rhythm:        Vascular    Pain in calf, thigh, or hip brought on by ambulation:    Pain in feet at night that wakes you up from your sleep:     Blood clot in your veins:    Leg swelling:           PHYSICAL EXAM: Vitals:   11/15/16 1052 11/15/16 1053  BP: (!) 156/94 (!) 152/90  Pulse: 97   Resp: 16   Temp: 97 F (36.1 C)   SpO2: 97%   Weight: 214 lb (97.1 kg)   Height: 5\' 10"  (1.778 m)     GENERAL: The patient is a well-nourished male, in no acute distress. The vital signs are documented above. CARDIOVASCULAR: 2+ radial 2+ femoral 2+ popliteal and 2+ dorsalis pedis pulses with no evidence of peripheral artery aneurysms. Carotid arteries without bruits bilaterally Abdomen soft with moderate obesity and no aneurysm palpable PULMONARY: There is good air exchange  MUSCULOSKELETAL: There are no major deformities or cyanosis. NEUROLOGIC: No focal weakness or paresthesias are  detected. SKIN: There are no ulcers or rashes noted. PSYCHIATRIC: The patient has a normal affect.  DATA:  Ultrasound today reveals maximal diameter of 4.74 cm. This is comparable to a CT scan showing 4.9 cm in November 2017.  MEDICAL ISSUES: Stable aneurysm size with no evidence of growth. Have recommended 6 month surveillance with ultrasound. Did review symptoms of leaking aneurysm he knows to report immediately to the emergency room should this occur. Explained this is very unlikely over 6 month interval with the size of his aneurysm. By CT scan he does not appear to be a candidate for standard stent graft repair. We will continue surveillance.    Rosetta Posner, MD FACS [facility] of [facility] Tel [phone] Pager [phone]

## 2016-11-22 ENCOUNTER — Encounter: Payer: Self-pay | Admitting: Vascular Surgery

## 2016-11-25 NOTE — Addendum Note (Signed)
Addended by: Lianne Cure A on: 11/25/2016 09:40 AM   Modules accepted: Orders

## 2017-02-24 ENCOUNTER — Encounter: Payer: Self-pay | Admitting: Family Medicine

## 2017-02-24 ENCOUNTER — Ambulatory Visit (INDEPENDENT_AMBULATORY_CARE_PROVIDER_SITE_OTHER): Payer: Medicare Other | Admitting: Family Medicine

## 2017-02-24 VITALS — BP 150/100 | HR 105 | Ht 68.25 in | Wt 213.0 lb

## 2017-02-24 DIAGNOSIS — I10 Essential (primary) hypertension: Secondary | ICD-10-CM | POA: Diagnosis not present

## 2017-02-24 DIAGNOSIS — M8580 Other specified disorders of bone density and structure, unspecified site: Secondary | ICD-10-CM

## 2017-02-24 DIAGNOSIS — Z114 Encounter for screening for human immunodeficiency virus [HIV]: Secondary | ICD-10-CM | POA: Diagnosis not present

## 2017-02-24 DIAGNOSIS — E785 Hyperlipidemia, unspecified: Secondary | ICD-10-CM | POA: Diagnosis not present

## 2017-02-24 DIAGNOSIS — H6121 Impacted cerumen, right ear: Secondary | ICD-10-CM

## 2017-02-24 MED ORDER — ALENDRONATE SODIUM 70 MG PO TABS: 70.0000 mg | ORAL_TABLET | ORAL | 1 refills | Status: DC

## 2017-02-24 NOTE — Progress Notes (Signed)
Subjective:   Jeffrey [name] is a 76 y.o. male who presents for Medicare Annual/Subsequent preventive examination.  Review of Systems:  No ROS.  Medicare Wellness Visit. Additional risk factors are reflected in the social history.  Cardiac Risk Factors include: advanced age (>16men, >30 women);male gender;hypertension;dyslipidemia     Objective:    Vitals: BP (!) 160/90 (BP Location: Left Arm, Patient Position: Sitting, Cuff Size: Normal)   Pulse (!) 55   Resp 16   Ht 5\' 10"  (1.778 m)   Wt 216 lb 6.4 oz (98.2 kg)   SpO2 96%   BMI 31.05 kg/m   Body mass index is 31.05 kg/m.  Tobacco History  Smoking Status  . Former Smoker  . Packs/day: 1.00  . Years: 32.00  . Types: Cigarettes  . Quit date: 08/17/1990  Smokeless Tobacco  . Never Used     Counseling given: Not Answered   Past Medical History:  Diagnosis Date  . AAA (abdominal aortic aneurysm) ([place])   . Allergy   . Arthritis   . Colon polyps   . Coronary artery calcification   . Eczematous dermatitis   . GERD (gastroesophageal reflux disease)   . Hypertension   . Iliac artery stenosis, left ([place])   . Osteopenia    Past Surgical History:  Procedure Laterality Date  . Basal Skin Cancer  2012  . CATARACT EXTRACTION Bilateral   . TONSILLECTOMY    . TRANSURETHRAL RESECTION OF PROSTATE     Family History  Problem Relation Age of Onset  . Diabetes Mother   . Hypertension Mother   . Stroke Mother   . Alcohol abuse Father   . Heart attack Father   . Heart disease Father   . Hypertension Father   . Cancer Sister   . Drug abuse Sister   . Hyperlipidemia Sister   . Cancer Brother   . Asthma Brother   . Arthritis Brother   . Hypertension Son    History  Sexual Activity  . Sexual activity: Yes  . Partners: Female    Outpatient Encounter Prescriptions as of 02/27/2017  Medication Sig  . alendronate (FOSAMAX) 70 MG tablet Take 1 tablet (70 mg total) by mouth once a week. (Patient taking differently:  Take 70 mg by mouth once a week. )  . Ascorbic Acid (VITAMIN C) 500 MG CAPS Take 500 mg by mouth daily.   Marland Kitchen aspirin EC 81 MG tablet Take 81 mg by mouth daily.   Marland Kitchen atorvastatin (LIPITOR) 20 MG tablet Take 20 mg by mouth daily at 6 PM.   . Calcium Carbonate-Vitamin D (CALCIUM PLUS VITAMIN D PO) Take 600 mg by mouth every other day. Take 315 mg of calcium and Vitamin d 250 units dialy   . Cyanocobalamin (RA VITAMIN B-12 TR) 1000 MCG TBCR Take 1,000 mcg by mouth daily.   . fexofenadine (ALLEGRA) 180 MG tablet Take 180 mg by mouth as needed.   . Glucosamine-Chondroitin 500-400 MG CAPS Take 2,000 mg by mouth daily.   Marland Kitchen ibuprofen (ADVIL,MOTRIN) 200 MG tablet Take 200 mg by mouth daily.   Marland Kitchen losartan (COZAAR) 25 MG tablet Take 25 mg by mouth daily.   . Multiple Vitamins-Minerals (CENTRUM ADULTS PO) Take by mouth daily.  . Omega-3 1000 MG CAPS Take 3,600 mg by mouth daily.    No facility-administered encounter medications on file as of 02/27/2017.     Activities of Daily Living In your present state of health, do you have any difficulty performing  the following activities: 02/27/2017  Hearing? N  Vision? N  Difficulty concentrating or making decisions? N  Walking or climbing stairs? N  Dressing or bathing? N  Doing errands, shopping? N  Preparing Food and eating ? N  Using the Toilet? N  In the past six months, have you accidently leaked urine? N  Do you have problems with loss of bowel control? N  Managing your Medications? N  Managing your Finances? N  Housekeeping or managing your Housekeeping? N  Some recent data might be hidden    Patient Care Team: Vivi Barrack, MD as PCP - General (Family Medicine) Rolley Sims as Consulting Physician (Ophthalmology) Chevis Pretty as Consulting Physician (Dentistry)   Assessment:    Physical assessment deferred to PCP.  Exercise Activities and Dietary recommendations Current Exercise Habits: Home exercise routine, Type of exercise: walking,  Time (Minutes): 60, Frequency (Times/Week): 7, Weekly Exercise (Minutes/Week): 420, Intensity: Mild, Exercise limited by: None identified  Eats once/day. Eats salad with olives, beets and crackers 3-4 x/week. 2-4x/week he eats pinto beans and tortilla.Snacks on apples throughout day. 2x/week he eats out. Drinks 4 glasses water/day. 2 glasses iced tea. Discussed increasing number of meals/day and increasing water intake.   Goals    . Weight (lb) < 200 lb (90.7 kg)      Fall Risk Fall Risk  02/27/2017 02/24/2017  Falls in the past year? Yes -  Number falls in past yr: 1 1  Injury with Fall? No No  Follow up Education provided -   Depression Screen PHQ 2/9 Scores 02/27/2017 02/24/2017  PHQ - 2 Score 0 0    Cognitive Function MMSE - Mini Mental State Exam 02/27/2017  Orientation to time 5  Orientation to Place 5  Registration 3  Attention/ Calculation 3  Recall 2  Language- name 2 objects 2  Language- repeat 1  Language- follow 3 step command 3  Language- read & follow direction 1  Write a sentence 1  Copy design 1  Total score 27        Immunization History  Administered Date(s) Administered  . Pneumococcal Conjugate-13 09/24/2014   Screening Tests Health Maintenance  Topic Date Due  . TETANUS/TDAP  02/08/1960  . PNA vac Low Risk Adult (2 of 2 - PPSV23) 09/24/2015  . INFLUENZA VACCINE  02/24/2018 (Originally 01/18/2017)      Plan:   Follow up with PCP as directed. Pt will monitor his BP and call to make an appt with PCP if his blood pressure is remaining high.  I have personally reviewed and noted the following in the patient's chart:   . Medical and social history . Use of alcohol, tobacco or illicit drugs  . Current medications and supplements . Functional ability and status . Nutritional status . Physical activity . Advanced directives . List of other physicians . Vitals . Screenings to include cognitive, depression, and falls . Referrals and appointments  In  addition, I have reviewed and discussed with patient certain preventive protocols, quality metrics, and best practice recommendations. A written personalized care plan for preventive services as well as general preventive health recommendations were provided to patient.     Ree Edman, RN  02/27/2017

## 2017-02-24 NOTE — Progress Notes (Signed)
Pre visit review using our clinic review tool, if applicable. No additional management support is needed unless otherwise documented below in the visit note. 

## 2017-02-24 NOTE — Assessment & Plan Note (Signed)
Continue atorvastatin. Check lipid panel today.  

## 2017-02-24 NOTE — Patient Instructions (Signed)
Try using flonase for a few weeks to see if it helps with your ears.  We will check blood work today.  Come back soon for your annual wellness visit with Cassie.  Take care,  Dr [name]

## 2017-02-24 NOTE — Progress Notes (Signed)
PCP notes:   Health maintenance: PPSV23 - pt believes he is up to date on all vaccines. He is going to get in touch with [facility] and send a message through [place] with the dates of his vaccinations. Tdap  Abnormal screenings: None.   Patient concerns: None   Nurse concerns: Blood pressure was elevated during visit. Pt is going to monitor his BP over the next few days and call and schedule an appt with PCP if it remains elevated.   Next PCP appt: 03/08/2018.

## 2017-02-24 NOTE — Assessment & Plan Note (Signed)
Continue fosamax. Will need repeat DEXA soon.

## 2017-02-24 NOTE — Progress Notes (Signed)
Subjective:  Jeffrey [name] is a 76 y.o. male who presents today with a chief complaint of HTN and to establish care.  HPI:  Right Sided Hearing Loss / Ear Fullness Started several months ago. Initially thought that it had a wax impacted. Saw his PCP at the time who told him to start mucinex. He has been on this for the past several months and it has not helped. Has been prescribed allegra for eczema in the past, but has not been on it recently.   Hypertension, Chronic Problem, Uncontrolled, New Problem for this provider BP Readings from Last 3 Encounters:  02/24/17 (!) 150/100  11/15/16 (!) 152/90  05/10/16 140/82   Home BP monitoring: 120s/70-80s Current Medications: losartan 25mg  daily, compliant without side effects. Additional History: Long standing history - first diagnosed about 8 years ago. Has steadily been increasing  ROS: Denies any chest pain, shortness of breath, dyspnea on exertion, leg edema.   HLD Currently on atorvastatin 20mg  daily. Tolerating well without side effects.  Osteopenia Diagnosed 5-6 years ago. Was prescribed fosamax but is no longer taking it. Has not had a follow up DEXA scan.   ROS: Per HPI, otherwise a 14 point review of systems was performed and was negative  PMH:  The following were reviewed and entered/updated in epic: Past Medical History:  Diagnosis Date  . AAA (abdominal aortic aneurysm) ([place])   . Allergy   . Arthritis   . Colon polyps   . Coronary artery calcification   . Eczematous dermatitis   . GERD (gastroesophageal reflux disease)   . Hypertension   . Iliac artery stenosis, left ([place])   . Osteopenia    Patient Active Problem List   Diagnosis Date Noted  . Osteopenia 02/24/2017  . Abdominal aortic aneurysm (AAA) without rupture ([place]) 07/06/2015  . Basal cell carcinoma 07/06/2015  . Essential (primary) hypertension 07/06/2015  . History of colon polyps 07/06/2015  . HLD (hyperlipidemia) 07/06/2015   Past Surgical  History:  Procedure Laterality Date  . Basal Skin Cancer  2012  . CATARACT EXTRACTION Bilateral   . TONSILLECTOMY    . TRANSURETHRAL RESECTION OF PROSTATE      Family History  Problem Relation Age of Onset  . Diabetes Mother   . Hypertension Mother   . Stroke Mother   . Alcohol abuse Father   . Heart attack Father   . Heart disease Father   . Hypertension Father   . Cancer Sister   . Drug abuse Sister   . Hyperlipidemia Sister   . Cancer Brother   . Asthma Brother   . Arthritis Brother   . Hypertension Son     Medications- reviewed and updated Current Outpatient Prescriptions  Medication Sig Dispense Refill  . alendronate (FOSAMAX) 70 MG tablet Take 1 tablet (70 mg total) by mouth once a week. 30 tablet 1  . Ascorbic Acid (VITAMIN C) 1000 MG tablet Take 500 mg by mouth.     Marland Kitchen aspirin EC 81 MG tablet Take 81 mg by mouth.    Marland Kitchen atorvastatin (LIPITOR) 20 MG tablet Take 20 mg by mouth daily at 6 PM.     . Calcium Carbonate-Vitamin D (CALCIUM PLUS VITAMIN D PO) Take by mouth. Take 315 mg of calcium and Vitamin d 250 units dialy    . Cyanocobalamin (RA VITAMIN B-12 TR) 1000 MCG TBCR Take 1,000 mcg by mouth.     . fexofenadine (ALLEGRA) 180 MG tablet Take 180 mg by mouth.    Marland Kitchen  Glucosamine-Chondroitin 500-400 MG CAPS Take 2,000 mg by mouth.     Marland Kitchen ibuprofen (ADVIL,MOTRIN) 200 MG tablet Take 200 mg by mouth.    . losartan (COZAAR) 25 MG tablet Take 25 mg by mouth daily.     . Multiple Vitamins-Minerals (CENTRUM ADULTS PO) Take by mouth daily.    . Omega-3 1000 MG CAPS Take 3,600 mg by mouth.      No current facility-administered medications for this visit.     Allergies-reviewed and updated Allergies  Allergen Reactions  . Ketotifen Fumarate Swelling    Eye swelling  . Triamcinolone Rash    HIVES    Social History   Social History  . Marital status: Divorced    Spouse name: N/A  . Number of children: 1  . Years of education: N/A   Occupational History  . Retired  [employer]   Social History Main Topics  . Smoking status: Former Smoker    Types: Cigarettes    Quit date: 08/17/1990  . Smokeless tobacco: Never Used  . Alcohol use Yes     Comment: Weekly   . Drug use: No  . Sexual activity: Yes    Partners: Female   Other Topics Concern  . None   Social History Narrative  . None    Objective:  Physical Exam: BP (!) 150/100   Pulse (!) 105   Ht 5' 8.25" (1.734 m)   Wt 213 lb (96.6 kg)   SpO2 98%   BMI 32.15 kg/m   Gen: NAD, resting comfortably HEENT: TMs obscured by cerumen bilaterally.  CV: RRR with no murmurs appreciated Pulm: NWOB, CTAB with no crackles, wheezes, or rhonchi GI: Normal bowel sounds present. Soft, Nontender, Nondistended. MSK: no edema, cyanosis, or clubbing noted Skin: warm, dry Neuro: grossly normal, moves all extremities Psych: Normal affect and thought content   Cerumen Removal Cerumen was removed with irrigation successfully. TMs visualized after irrigation with clear effusion.   Assessment/Plan:  Essential (primary) hypertension Slightly above goal today. 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 His home logs are usually within range. No adjustments today. Follow up within the next month for his annual wellness visit and recheck then. Check CMET today.   HLD (hyperlipidemia) Continue atorvastatin. Check lipid panel today.   Osteopenia Continue fosamax. Will need repeat DEXA soon.   Impacted Cerumen Successfully irrigated today with resolution of symptoms. Some clear effusion noted after irrigation - recommended trial of OTC intranasal steroids.  Preventative Healthcare Flu shot declined. Check A1c today. Follow up within the next month for AWV.   Algis Greenhouse. [name], MD 02/24/2017 2:48 PM

## 2017-02-24 NOTE — Assessment & Plan Note (Addendum)
Slightly above goal today. 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 His home logs are usually within range. No adjustments today. Follow up within the next month for his annual wellness visit and recheck then. Check CMET today.

## 2017-02-25 ENCOUNTER — Encounter: Payer: Self-pay | Admitting: Family Medicine

## 2017-02-25 LAB — CBC
HCT: 38.5 % (ref 38.5–50.0)
Hemoglobin: 13.2 g/dL (ref 13.2–17.1)
MCH: 33.6 pg — ABNORMAL HIGH (ref 27.0–33.0)
MCHC: 34.3 g/dL (ref 32.0–36.0)
MCV: 98 fL (ref 80.0–100.0)
MPV: 9.8 fL (ref 7.5–12.5)
Platelets: 220 10*3/uL (ref 140–400)
RBC: 3.93 10*6/uL — AB (ref 4.20–5.80)
RDW: 13 % (ref 11.0–15.0)
WBC: 7 10*3/uL (ref 3.8–10.8)

## 2017-02-25 LAB — COMPREHENSIVE METABOLIC PANEL
AG RATIO: 1.6 (calc) (ref 1.0–2.5)
ALBUMIN MSPROF: 4.5 g/dL (ref 3.6–5.1)
ALT: 21 U/L (ref 9–46)
AST: 25 U/L (ref 10–35)
Alkaline phosphatase (APISO): 55 U/L (ref 40–115)
BILIRUBIN TOTAL: 0.8 mg/dL (ref 0.2–1.2)
BUN: 10 mg/dL (ref 7–25)
CALCIUM: 9.6 mg/dL (ref 8.6–10.3)
CO2: 27 mmol/L (ref 20–32)
Chloride: 100 mmol/L (ref 98–110)
Creat: 0.91 mg/dL (ref 0.70–1.18)
GLUCOSE: 86 mg/dL (ref 65–99)
Globulin: 2.9 g/dL (calc) (ref 1.9–3.7)
POTASSIUM: 4.4 mmol/L (ref 3.5–5.3)
Sodium: 136 mmol/L (ref 135–146)
TOTAL PROTEIN: 7.4 g/dL (ref 6.1–8.1)

## 2017-02-25 LAB — LIPID PANEL
Cholesterol: 134 mg/dL (ref <200)
HDL: 72 mg/dL (ref >40)
LDL Cholesterol (Calc): 46 mg/dL (calc)
Non-HDL Cholesterol (Calc): 62 mg/dL (calc) (ref <130)
Total CHOL/HDL Ratio: 1.9 (calc) (ref <5.0)
Triglycerides: 77 mg/dL (ref <150)

## 2017-02-25 LAB — HEMOGLOBIN A1C
EAG (MMOL/L): 5 (calc)
Hgb A1c MFr Bld: 4.8 % of total Hgb (ref <5.7)
MEAN PLASMA GLUCOSE: 91 (calc)

## 2017-02-25 LAB — HIV ANTIBODY (ROUTINE TESTING W REFLEX): HIV: NONREACTIVE

## 2017-02-27 ENCOUNTER — Encounter: Payer: Self-pay | Admitting: *Deleted

## 2017-02-27 ENCOUNTER — Ambulatory Visit (INDEPENDENT_AMBULATORY_CARE_PROVIDER_SITE_OTHER): Payer: Medicare Other | Admitting: *Deleted

## 2017-02-27 VITALS — BP 160/90 | HR 55 | Resp 16 | Ht 70.0 in | Wt 216.4 lb

## 2017-02-27 DIAGNOSIS — Z0000 Encounter for general adult medical examination without abnormal findings: Secondary | ICD-10-CM

## 2017-02-27 NOTE — Patient Instructions (Addendum)
Jeffrey [name] ,  Ask [facility] the date of your PPSV23 and Tetanus vaccinations. You can send a message through [place] with those dates.   Please schedule a visit with Dr [name] if you notice your blood pressure is trending high at home.   Thank you for taking time to come for your Medicare Wellness Visit. I appreciate your ongoing commitment to your health goals. Please review the following plan we discussed and let me know if I can assist you in the future.   These are the goals we discussed: Goals    . Weight (lb) < 200 lb (90.7 kg)       This is a list of the screening recommended for you and due dates:  Health Maintenance  Topic Date Due  . Tetanus Vaccine  02[birth year]08/1960  . Pneumonia vaccines (2 of 2 - PPSV23) 09/24/2015  . Flu Shot  02/24/2018*  *Topic was postponed. The date shown is not the original due date.   Preventive Care for Adults  A healthy lifestyle and preventive care can promote health and wellness. Preventive health guidelines for adults include the following key practices.  . A routine yearly physical is a good way to check with your health care provider about your health and preventive screening. It is a chance to share any concerns and updates on your health and to receive a thorough exam.  . Visit your dentist for a routine exam and preventive care every 6 months. Brush your teeth twice a day and floss once a day. Good oral hygiene prevents tooth decay and gum disease.  . The frequency of eye exams is based on your age, health, family medical history, use  of contact lenses, and other factors. Follow your health care provider's ecommendations for frequency of eye exams.  . Eat a healthy diet. Foods like vegetables, fruits, whole grains, low-fat dairy products, and lean protein foods contain the nutrients you need without too many calories. Decrease your intake of foods high in solid fats, added sugars, and salt. Eat the right amount of calories for you. Get  information about a proper diet from your health care provider, if necessary.  . Regular physical exercise is one of the most important things you can do for your health. Most adults should get at least 150 minutes of moderate-intensity exercise (any activity that increases your heart rate and causes you to sweat) each week. In addition, most adults need muscle-strengthening exercises on 2 or more days a week.  Silver Sneakers may be a benefit available to you. To determine eligibility, you may visit the website: www.silversneakers.com or contact program at (825)717-4007 Mon-Fri between 8AM-8PM.   . Maintain a healthy weight. The body mass index (BMI) is a screening tool to identify possible weight problems. It provides an estimate of body fat based on height and weight. Your health care provider can find your BMI and can help you achieve or maintain a healthy weight.   For adults 20 years and older: ? A BMI below 18.5 is considered underweight. ? A BMI of 18.5 to 24.9 is normal. ? A BMI of 25 to 29.9 is considered overweight. ? A BMI of 30 and above is considered obese.   . Maintain normal blood lipids and cholesterol levels by exercising and minimizing your intake of saturated fat. Eat a balanced diet with plenty of fruit and vegetables. Blood tests for lipids and cholesterol should begin at age 4 and be repeated every 5 years. If your lipid  or cholesterol levels are high, you are over 50, or you are at high risk for heart disease, you may need your cholesterol levels checked more frequently. Ongoing high lipid and cholesterol levels should be treated with medicines if diet and exercise are not working.  . If you smoke, find out from your health care provider how to quit. If you do not use tobacco, please do not start.  . If you choose to drink alcohol, please do not consume more than 2 drinks per day. One drink is considered to be 12 ounces (355 mL) of beer, 5 ounces (148 mL) of wine, or 1.5  ounces (44 mL) of liquor.  . If you are 32-46 years old, ask your health care provider if you should take aspirin to prevent strokes.  . Use sunscreen. Apply sunscreen liberally and repeatedly throughout the day. You should seek shade when your shadow is shorter than you. Protect yourself by wearing long sleeves, pants, a wide-brimmed hat, and sunglasses year round, whenever you are outdoors.  . Once a month, do a whole body skin exam, using a mirror to look at the skin on your back. Tell your health care provider of new moles, moles that have irregular borders, moles that are larger than a pencil eraser, or moles that have changed in shape or color.

## 2017-02-28 ENCOUNTER — Encounter: Payer: Self-pay | Admitting: Family Medicine

## 2017-03-02 ENCOUNTER — Other Ambulatory Visit: Payer: Self-pay

## 2017-03-02 MED ORDER — LOSARTAN POTASSIUM 25 MG PO TABS: 25.0000 mg | ORAL_TABLET | Freq: Two times a day (BID) | ORAL | 5 refills | Status: DC

## 2017-03-02 NOTE — Progress Notes (Signed)
I have reviewed the patient's Medicare Annual Wellness Visit and agree with the assessment and plan.

## 2017-03-02 NOTE — Telephone Encounter (Signed)
Please see message regarding blood pressure.

## 2017-03-06 ENCOUNTER — Telehealth: Payer: Self-pay | Admitting: Family Medicine

## 2017-03-06 NOTE — Telephone Encounter (Signed)
ROI faxed to [facility]

## 2017-03-09 DIAGNOSIS — L821 Other seborrheic keratosis: Secondary | ICD-10-CM | POA: Diagnosis not present

## 2017-03-09 DIAGNOSIS — L570 Actinic keratosis: Secondary | ICD-10-CM | POA: Diagnosis not present

## 2017-03-09 DIAGNOSIS — L814 Other melanin hyperpigmentation: Secondary | ICD-10-CM | POA: Diagnosis not present

## 2017-04-07 ENCOUNTER — Encounter: Payer: Self-pay | Admitting: Family Medicine

## 2017-04-26 ENCOUNTER — Ambulatory Visit: Payer: Medicare Other | Admitting: Family Medicine

## 2017-04-26 ENCOUNTER — Encounter: Payer: Self-pay | Admitting: Family Medicine

## 2017-04-26 VITALS — BP 160/90 | HR 63 | Ht 70.0 in | Wt 207.8 lb

## 2017-04-26 DIAGNOSIS — Z23 Encounter for immunization: Secondary | ICD-10-CM | POA: Diagnosis not present

## 2017-04-26 DIAGNOSIS — J329 Chronic sinusitis, unspecified: Secondary | ICD-10-CM | POA: Diagnosis not present

## 2017-04-26 DIAGNOSIS — I10 Essential (primary) hypertension: Secondary | ICD-10-CM | POA: Diagnosis not present

## 2017-04-26 NOTE — Patient Instructions (Signed)
No changes today.   Check your blood pressure a few times per week. Make sure you are seated for 5-10 minutes before checking.  Come back to see me in 4-6 months, or sooner as needed.  Take care,  Dr [name]

## 2017-04-26 NOTE — Assessment & Plan Note (Signed)
Doing well and improving on Flonase.  Will continue.  Also continue Allegra.

## 2017-04-26 NOTE — Assessment & Plan Note (Signed)
Again, patient's blood pressure is elevated while in office.  He has extensive logs of his home blood pressures all of which are consistently lower than 140/90.  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  Given his well-controlled pressures at home, do not need to escalate his dose today.  Continue losartan 25 mg twice daily.  Follow-up in approximately 6 months.  Return precautions reviewed.

## 2017-04-26 NOTE — Progress Notes (Signed)
    Subjective:  Jeffrey [name] is a 76 y.o. male who presents today with a chief complaint of hypertension follow-up.   HPI:  Hypertension, Chronic Problem, Stable BP Readings from Last 3 Encounters:  04/26/17 (!) 160/90  02/27/17 (!) 160/90  02/24/17 (!) 150/100   Home BP monitoring: 130s-140s over 80s Current Medications: Losartan 25 mg daily, compliant without side effects. Interim History: Patient has been checking his blood pressure at home.  Usually checks it twice daily.  States that his first reading is usually high.  He takes this immediately after walking down a flight of stairs.  ROS: Denies any chest pain, shortness of breath, dyspnea on exertion, leg edema.    Right Ear Fullness/sinusitis, chronic problem, improving Patient started on Flonase 2 months ago and has noticed significant improvement in symptoms.  No longer having to blow his nose.  Less nasal drainage.  Less sinus pressure.  Also noticed that his hearing is improved.  Is also taking Allegra as needed.  ROS: Per HPI  PMH: Smoking history reviewed.  Former smoker.  Objective:  Physical Exam: BP (!) 160/90   Pulse 63   Ht 5\' 10"  (1.778 m)   Wt 207 lb 12.8 oz (94.3 kg)   SpO2 98%   BMI 29.82 kg/m   Gen: NAD, resting comfortably HEENT: Right TM with clear effusion.  Left TM clear.  Oropharynx clear.  Nasal mucosa slightly erythematous.  No lymphadenopathy. CV: RRR with no murmurs appreciated Pulm: NWOB, CTAB with no crackles, wheezes, or rhonchi  Assessment/Plan:  Essential (primary) hypertension Again, patient's blood pressure is elevated while in office.  He has extensive logs of his home blood pressures all of which are consistently lower than 140/90.  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  Given his well-controlled pressures at home, do not need to escalate his dose today.  Continue losartan 25 mg twice daily.  Follow-up in approximately 6 months.  Return precautions reviewed.  Sinusitis Doing  well and improving on Flonase.  Will continue.  Also continue Allegra.  Preventative healthcare Flu shot given today.  Pneumonia vaccine also given.  Algis Greenhouse. [name], MD 04/27/2017 8:06 AM

## 2017-05-23 ENCOUNTER — Other Ambulatory Visit: Payer: Self-pay

## 2017-05-23 ENCOUNTER — Encounter: Payer: Self-pay | Admitting: Family

## 2017-05-23 ENCOUNTER — Ambulatory Visit (HOSPITAL_COMMUNITY)
Admission: RE | Admit: 2017-05-23 | Discharge: 2017-05-23 | Disposition: A | Discharge status: Home / Self Care | Payer: Medicare Other | Source: Ambulatory Visit | Attending: Vascular Surgery | Admitting: Vascular Surgery

## 2017-05-23 ENCOUNTER — Ambulatory Visit (INDEPENDENT_AMBULATORY_CARE_PROVIDER_SITE_OTHER): Payer: Medicare Other | Admitting: Family

## 2017-05-23 VITALS — BP 152/103 | HR 57 | Temp 97.5°F | Resp 18 | Ht 70.0 in | Wt 208.0 lb

## 2017-05-23 DIAGNOSIS — I714 Abdominal aortic aneurysm, without rupture, unspecified: Secondary | ICD-10-CM

## 2017-05-23 DIAGNOSIS — Z87891 Personal history of nicotine dependence: Secondary | ICD-10-CM | POA: Diagnosis not present

## 2017-05-23 NOTE — Progress Notes (Signed)
[facility]    [facility]: Follow up Abdominal Aortic Aneurysm  History of Present Illness  Jeffrey [name] is a 76 y.o. (1940/08/17) male here today for follow-up. He continues to be in good health. He has had no new major medical difficulties. Specifically no cardiac issues and no symptoms referable to his aneurysm.  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   Dr. Donnetta Hutching last evaluated pt on 11-15-16. At that time aneurysm size was stable with no evidence of growth; Dr. Donnetta Hutching recommended 6 month surveillance with ultrasound, reviewed symptoms of leaking aneurysm he knows to report immediately to the emergency room should this occur. Explained this is very unlikely over 6 month interval with the size of his aneurysm. By CT scan he does not appear to be a candidate for standard stent graft repair, CT scan showing 4.9 cm in November 2017. We will continue surveillance.  The patient denies claudication in legs with walking. The patient denies history of stroke or TIA symptoms.  He checks his blood pressure at home twice/week: runs about 122/80.   Pt Diabetic: No Pt smoker: former smoker, quit in 1992, smoked x 32 years   Past Medical History:  Diagnosis Date  . AAA (abdominal aortic aneurysm) ([place])   . Allergy   . Arthritis   . Colon polyps   . Coronary artery calcification   . Eczematous dermatitis   . GERD (gastroesophageal reflux disease)   . Hypertension   . Iliac artery stenosis, left ([place])   . Osteopenia    Past Surgical History:  Procedure Laterality Date  . Basal Skin Cancer  2012  . CATARACT EXTRACTION Bilateral   . TONSILLECTOMY    . TRANSURETHRAL RESECTION OF PROSTATE     Social History Social History   Socioeconomic History  . Marital status: Divorced    Spouse name: Not on file  . Number of children: 1  . Years of education: Not on  file  . Highest education level: Not on file  Social Needs  . Financial resource strain: Not on file  . Food insecurity - worry: Not on file  . Food insecurity - inability: Not on file  . Transportation needs - medical: Not on file  . Transportation needs - non-medical: Not on file  Occupational History  . Occupation: Retired    Fish farm manager: [employer]  Tobacco Use  . Smoking status: Former Smoker    Packs/day: 1.00    Years: 32.00    Pack years: 32.00    Types: Cigarettes    Last attempt to quit: 08/17/1990    Years since quitting: 26.7  . Smokeless tobacco: Never Used  Substance and Sexual Activity  . Alcohol use: Yes    Comment: Occassionally, sometimes daily sometimes weekly.   . Drug use: No  . Sexual activity: Yes    Partners: Female  Other Topics Concern  . Not on file  Social History Narrative  . Not on file   Family History Family History  Problem Relation Age of Onset  . Diabetes Mother   . Hypertension Mother   . Stroke Mother   . Alcohol abuse Father   . Heart attack Father   . Heart disease Father   . Hypertension Father   . Cancer Sister   . Drug abuse Sister   . Hyperlipidemia Sister   .  Cancer Brother   . Asthma Brother   . Arthritis Brother   . Hypertension Son     Current Outpatient Medications on File Prior to Visit  Medication Sig Dispense Refill  . alendronate (FOSAMAX) 70 MG tablet Take 1 tablet (70 mg total) by mouth once a week. (Patient taking differently: Take 70 mg by mouth once a week. ) 30 tablet 1  . Ascorbic Acid (VITAMIN C) 1000 MG tablet Take 1,000 mg by mouth.    Marland Kitchen aspirin EC 81 MG tablet Take 81 mg by mouth daily.     Marland Kitchen atorvastatin (LIPITOR) 20 MG tablet Take 20 mg by mouth daily at 6 PM.     . Calcium Carb-Cholecalciferol 600-800 MG-UNIT CHEW Chew by mouth.    . Cyanocobalamin (RA VITAMIN B-12 TR) 1000 MCG TBCR Take 1,000 mcg by mouth daily.     . fexofenadine (ALLEGRA) 180 MG tablet Take 180 mg by mouth as needed.     .  Glucosamine-Chondroitin 500-400 MG CAPS Take 2,000 mg by mouth daily.     Marland Kitchen ibuprofen (ADVIL,MOTRIN) 200 MG tablet Take 200 mg by mouth daily.     . IRON PO Take by mouth.    . losartan (COZAAR) 25 MG tablet Take 1 tablet (25 mg total) by mouth 2 (two) times daily. 60 tablet 5  . Multiple Vitamins-Minerals (MULTIVITAMIN ADULT PO) Take by mouth.    . Omega-3 1000 MG CAPS Take 3,600 mg by mouth daily.     . DOCOSAHEXAENOIC ACID PO Take 1 g by mouth.    . Multiple Vitamins-Minerals (CENTRUM ADULTS PO) Take by mouth daily.     No current facility-administered medications on file prior to visit.    Allergies  Allergen Reactions  . Ketotifen Fumarate Swelling    Eye swelling  . Triamcinolone Rash    HIVES    ROS: See HPI for pertinent positives and negatives.  Physical Examination  Vitals:   05/23/17 1045 05/23/17 1052  BP: (!) 152/96 (!) 152/103  Pulse: (!) 54 (!) 57  Resp: 18   Temp: (!) 97.5 F (36.4 C)   TempSrc: Oral   SpO2: 99%   Weight: 208 lb (94.3 kg)   Height: 5\' 10"  (1.778 m)    Body mass index is 29.84 kg/m.  General: A&O x 3, WD, male.  Pulmonary: Sym exp, respirations are non labored, good air movement in all fields, CTAB, no rales, rhonchi, or wheezing.  Cardiac: Regular rhythm, bradycardic (not on a beta blocker), no detected murmur.   Carotid Bruits Right Left   Negative Negative    Adominal aortic pulse is not palpable Radial pulses are 2+ palpable                          VASCULAR EXAM:                                                                                                         LE Pulses Right Left       FEMORAL  2+ palpable  2+ palpable        POPLITEAL  not palpable   not palpable       POSTERIOR TIBIAL  2+ palpable   2+ palpable        DORSALIS PEDIS      ANTERIOR TIBIAL 2+ palpable  2+ palpable      Gastrointestinal: soft, NTND, -G/R, - HSM, - masses palpated, - CVAT B.  Musculoskeletal: M/S 5/5 throughout, Extremities  without ischemic changes.  Skin: No rashes, no ulcers, no cellulitis.    Neurologic: CN 2-12 intact, Pain and light touch intact in extremities are intact except, Motor exam as listed above.   DATA  AAA Duplex (05/23/2017):  Previous size: 4.6 cm by CT (Date: November 2017)  Current size:  5.0 cm (Date: 05-23-17); Right CIA: 1.4 cm; Left CIA: 1.0 cm.  Medical Decision Making  The patient is a 76 y.o. male who presents with asymptomatic AAA with no signficant increase in size: from 4.9 cm by CT in November 2017 to 5.0 cm.   Based on this patient's exam and diagnostic studies, and after discussing with Dr. Donnetta Hutching, the patient will follow up in 6 months with the following studies: AAA duplex.  Consideration for repair of AAA would be made when the size is 5.5 cm, growth > 1 cm/yr, and symptomatic status.        The patient was given information about AAA including signs, symptoms, treatment, and how to minimize the risk of enlargement and rupture of aneurysms.    I emphasized the importance of maximal medical management including strict control of blood pressure, blood glucose, and lipid levels, antiplatelet agents, obtaining regular exercise, and continued cessation of smoking.   The patient was advised to call 911 should the patient experience sudden onset abdominal or back pain.   Thank you for allowing [place] to participate in this patient's care.  Clemon Chambers, RN, MSN, FNP-C [facility] [place] Office: [phone]  Clinic Physician: Early  05/23/2017, 11:17 AM

## 2017-05-23 NOTE — Patient Instructions (Signed)
Abdominal Aortic Aneurysm Blood pumps away from the heart through tubes (blood vessels) called arteries. Aneurysms are weak or damaged places in the wall of an artery. It bulges out like a balloon. An abdominal aortic aneurysm happens in the main artery of the body (aorta). It can burst or tear, causing bleeding inside the body. This is an emergency. It needs treatment right away. What are the causes? The exact cause is unknown. Things that could cause this problem include:  Fat and other substances building up in the lining of a tube.  Swelling of the walls of a blood vessel.  Certain tissue diseases.  Belly (abdominal) trauma.  An infection in the main artery of the body.  What increases the risk? There are things that make it more likely for you to have an aneurysm. These include:  Being over the age of 76 years old.  Having high blood pressure (hypertension).  Being a male.  Being white.  Being very overweight (obese).  Having a family history of aneurysm.  Using tobacco products.  What are the signs or symptoms? Symptoms depend on the size of the aneurysm and how fast it grows. There may not be symptoms. If symptoms occur, they can include:  Pain (belly, side, lower back, or groin).  Feeling full after eating a small amount of food.  Feeling sick to your stomach (nauseous), throwing up (vomiting), or both.  Feeling a lump in your belly that feels like it is beating (pulsating).  Feeling like you will pass out (faint).  How is this treated?  Medicine to control blood pressure and pain.  Imaging tests to see if the aneurysm gets bigger.  Surgery. How is this prevented? To lessen your chance of getting this condition:  Stop smoking. Stop chewing tobacco.  Limit or avoid alcohol.  Keep your blood pressure, blood sugar, and cholesterol within normal limits.  Eat less salt.  Eat foods low in saturated fats and cholesterol. These are found in animal and  whole dairy products.  Eat more fiber. Fiber is found in whole grains, vegetables, and fruits.  Keep a healthy weight.  Stay active and exercise often.  This information is not intended to replace advice given to you by your health care provider. Make sure you discuss any questions you have with your health care provider. Document Released: 10/01/2012 Document Revised: 11/12/2015 Document Reviewed: 07/06/2012 Elsevier Interactive Patient Education  2017 [employer]vier Inc.  

## 2017-05-31 ENCOUNTER — Encounter: Payer: Self-pay | Admitting: Family

## 2017-06-02 NOTE — Addendum Note (Signed)
Addended by: Lianne Cure A on: 06/02/2017 03:15 PM   Modules accepted: Orders

## 2017-07-15 ENCOUNTER — Encounter: Payer: Self-pay | Admitting: Family Medicine

## 2017-07-17 ENCOUNTER — Other Ambulatory Visit: Payer: Self-pay

## 2017-07-17 MED ORDER — ATORVASTATIN CALCIUM 20 MG PO TABS: 20.0000 mg | ORAL_TABLET | Freq: Every day | ORAL | 1 refills | Status: DC

## 2017-08-05 ENCOUNTER — Other Ambulatory Visit: Payer: Self-pay | Admitting: Family Medicine

## 2017-08-10 ENCOUNTER — Other Ambulatory Visit: Payer: Self-pay

## 2017-08-10 ENCOUNTER — Encounter: Payer: Self-pay | Admitting: Family Medicine

## 2017-08-10 DIAGNOSIS — Z85828 Personal history of other malignant neoplasm of skin: Secondary | ICD-10-CM

## 2017-09-18 ENCOUNTER — Other Ambulatory Visit: Payer: Self-pay | Admitting: Family Medicine

## 2017-09-19 DIAGNOSIS — L570 Actinic keratosis: Secondary | ICD-10-CM | POA: Diagnosis not present

## 2017-09-19 DIAGNOSIS — Z85828 Personal history of other malignant neoplasm of skin: Secondary | ICD-10-CM | POA: Diagnosis not present

## 2017-09-19 DIAGNOSIS — D229 Melanocytic nevi, unspecified: Secondary | ICD-10-CM | POA: Diagnosis not present

## 2017-10-11 ENCOUNTER — Other Ambulatory Visit: Payer: Self-pay | Admitting: Family Medicine

## 2017-11-07 ENCOUNTER — Other Ambulatory Visit: Payer: Self-pay

## 2017-11-07 ENCOUNTER — Other Ambulatory Visit: Payer: Self-pay | Admitting: Family Medicine

## 2017-11-21 ENCOUNTER — Encounter: Payer: Self-pay | Admitting: Family

## 2017-11-21 ENCOUNTER — Ambulatory Visit: Payer: Medicare Other | Admitting: Family

## 2017-11-21 ENCOUNTER — Ambulatory Visit (HOSPITAL_COMMUNITY)
Admission: RE | Admit: 2017-11-21 | Discharge: 2017-11-21 | Disposition: A | Discharge status: Home / Self Care | Payer: Medicare Other | Source: Ambulatory Visit | Attending: Family | Admitting: Family

## 2017-11-21 ENCOUNTER — Other Ambulatory Visit: Payer: Self-pay

## 2017-11-21 VITALS — BP 150/91 | HR 57 | Temp 97.5°F | Resp 20 | Ht 70.0 in | Wt 215.6 lb

## 2017-11-21 DIAGNOSIS — Z87891 Personal history of nicotine dependence: Secondary | ICD-10-CM

## 2017-11-21 DIAGNOSIS — I714 Abdominal aortic aneurysm, without rupture, unspecified: Secondary | ICD-10-CM

## 2017-11-21 DIAGNOSIS — I713 Abdominal aortic aneurysm, ruptured, unspecified: Secondary | ICD-10-CM

## 2017-11-21 NOTE — Patient Instructions (Addendum)
Abdominal Aortic Aneurysm Blood pumps away from the heart through tubes (blood vessels) called arteries. Aneurysms are weak or damaged places in the wall of an artery. It bulges out like a balloon. An abdominal aortic aneurysm happens in the main artery of the body (aorta). It can burst or tear, causing bleeding inside the body. This is an emergency. It needs treatment right away. What are the causes? The exact cause is unknown. Things that could cause this problem include:  Fat and other substances building up in the lining of a tube.  Swelling of the walls of a blood vessel.  Certain tissue diseases.  Belly (abdominal) trauma.  An infection in the main artery of the body.  What increases the risk? There are things that make it more likely for you to have an aneurysm. These include:  Being over the age of 77 years old.  Having high blood pressure (hypertension).  Being a male.  Being white.  Being very overweight (obese).  Having a family history of aneurysm.  Using tobacco products.  What are the signs or symptoms? Symptoms depend on the size of the aneurysm and how fast it grows. There may not be symptoms. If symptoms occur, they can include:  Pain (belly, side, lower back, or groin).  Feeling full after eating a small amount of food.  Feeling sick to your stomach (nauseous), throwing up (vomiting), or both.  Feeling a lump in your belly that feels like it is beating (pulsating).  Feeling like you will pass out (faint).  How is this treated?  Medicine to control blood pressure and pain.  Imaging tests to see if the aneurysm gets bigger.  Surgery. How is this prevented? To lessen your chance of getting this condition:  Stop smoking. Stop chewing tobacco.  Limit or avoid alcohol.  Keep your blood pressure, blood sugar, and cholesterol within normal limits.  Eat less salt.  Eat foods low in saturated fats and cholesterol. These are found in animal and  whole dairy products.  Eat more fiber. Fiber is found in whole grains, vegetables, and fruits.  Keep a healthy weight.  Stay active and exercise often.  This information is not intended to replace advice given to you by your health care provider. Make sure you discuss any questions you have with your health care provider. Document Released: 10/01/2012 Document Revised: 11/12/2015 Document Reviewed: 07/06/2012 Elsevier Interactive Patient Education  2017 Elsevier Inc.  

## 2017-11-21 NOTE — Progress Notes (Signed)
[facility] [place]   CC: Follow up Abdominal Aortic Aneurysm  History of Present Illness  Jeffrey [name] is a 77 y.o. ([birth year]) male here today for follow-up. He continues to be in good health. He has had no new major medical difficulties. Specifically no cardiac issues and no symptoms referable to his aneurysm.  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   Dr. Donnetta Hutching last evaluated pt on 11-15-16. At that time aneurysm size was stable with no evidence of growth; Dr. Donnetta Hutching recommended 6 month surveillance with ultrasound, reviewed symptoms of leaking aneurysm he knows to report immediately to the emergency room should this occur. Explained this is very unlikely over 6 month interval with the size of his aneurysm. By CT scan he does not appear to be a candidate for standard stent graft repair, CT scan showing 4.9 cm in November 2017. We will continue surveillance.  The patient denies claudication in legs with walking. The patient denies history of stroke or TIA symptoms.  He checks his blood pressure at home twice/week: runs about 120's/60's.   0.91 serum creatinine is last result on file, 02-24-17.  Pt Diabetic: No Pt smoker: former smoker, quit in 1992, smoked x 32 years    Past Medical History:  Diagnosis Date  . AAA (abdominal aortic aneurysm) ([place])   . Allergy   . Arthritis   . Colon polyps   . Coronary artery calcification   . Eczematous dermatitis   . GERD (gastroesophageal reflux disease)   . Hypertension   . Iliac artery stenosis, left ([place])   . Osteopenia    Past Surgical History:  Procedure Laterality Date  . Basal Skin Cancer  2012  . CATARACT EXTRACTION Bilateral   . TONSILLECTOMY    . TRANSURETHRAL RESECTION OF PROSTATE     Social History Social History   Socioeconomic History  . Marital status: Divorced    Spouse name:  Not on file  . Number of children: 1  . Years of education: Not on file  . Highest education level: Not on file  Occupational History  . Occupation: Retired    Fish farm manager: [employer]  . Financial resource strain: Not on file  . Food insecurity:    Worry: Not on file    Inability: Not on file  . Transportation needs:    Medical: Not on file    Non-medical: Not on file  Tobacco Use  . Smoking status: Former Smoker    Packs/day: 1.00    Years: 32.00    Pack years: 32.00    Types: Cigarettes    Last attempt to quit: 08/17/1990    Years since quitting: 27.2  . Smokeless tobacco: Never Used  Substance and Sexual Activity  . Alcohol use: Yes    Comment: Occassionally, sometimes daily sometimes weekly.   . Drug use: No  . Sexual activity: Yes    Partners: Female  Lifestyle  . Physical activity:    Days per week: Not on file    Minutes per session: Not on file  . Stress: Not on file  Relationships  . Social connections:    Talks on phone: Not on file    Gets together: Not on file    Attends religious service: Not on file    Active member of club or organization: Not on file  Attends meetings of clubs or organizations: Not on file    Relationship status: Not on file  . Intimate partner violence:    Fear of current or ex partner: Not on file    Emotionally abused: Not on file    Physically abused: Not on file    Forced sexual activity: Not on file  Other Topics Concern  . Not on file  Social History Narrative  . Not on file   Family History Family History  Problem Relation Age of Onset  . Diabetes Mother   . Hypertension Mother   . Stroke Mother   . Alcohol abuse Father   . Heart attack Father   . Heart disease Father   . Hypertension Father   . Cancer Sister   . Drug abuse Sister   . Hyperlipidemia Sister   . Cancer Brother   . Asthma Brother   . Arthritis Brother   . Hypertension Son     Current Outpatient Medications on File Prior to  Visit  Medication Sig Dispense Refill  . alendronate (FOSAMAX) 70 MG tablet TAKE 1 TABLET BY MOUTH EVERY WEEK 12 tablet 0  . Ascorbic Acid (VITAMIN C) 1000 MG tablet Take 1,000 mg by mouth.    Marland Kitchen aspirin EC 81 MG tablet Take 81 mg by mouth daily.     Marland Kitchen atorvastatin (LIPITOR) 20 MG tablet Take 1 tablet (20 mg total) by mouth daily at 6 PM. 90 tablet 1  . Cyanocobalamin (RA VITAMIN B-12 TR) 1000 MCG TBCR Take 1,000 mcg by mouth daily.     . DOCOSAHEXAENOIC ACID PO Take 1 g by mouth.    . Glucosamine-Chondroitin 500-400 MG CAPS Take 2,000 mg by mouth daily.     Marland Kitchen ibuprofen (ADVIL,MOTRIN) 200 MG tablet Take 200 mg by mouth daily.     . IRON PO Take by mouth.    . losartan (COZAAR) 50 MG tablet Take 100 mg by mouth 2 (two) times daily.    . Multiple Vitamins-Minerals (CENTRUM ADULTS PO) Take by mouth daily.    . Multiple Vitamins-Minerals (MULTIVITAMIN ADULT PO) Take by mouth.    . Omega-3 1000 MG CAPS Take 3,600 mg by mouth daily.     . fexofenadine (ALLEGRA) 180 MG tablet Take 180 mg by mouth as needed.      No current facility-administered medications on file prior to visit.    Allergies  Allergen Reactions  . Ketotifen Fumarate Swelling    Eye swelling  . Triamcinolone Rash    HIVES    ROS: See HPI for pertinent positives and negatives.  Physical Examination  Vitals:   11/21/17 1049 11/21/17 1051  BP: (!) 162/95 (!) 150/91  Pulse: (!) 57   Resp: 20   Temp: (!) 97.5 F (36.4 C)   TempSrc: Oral   SpO2: 100%   Weight: 215 lb 9.6 oz (97.8 kg)   Height: 5\' 10"  (1.778 m)    Body mass index is 30.94 kg/m.  General: A&O x 3, WD obese male. HEENT: WNL, no gross abnormalities  Pulmonary: Sym exp, respirations are non labored, good air movement in all fields, CTAB, no rales, rhonchi, or wheezing. Cardiac: Regular rhythm, bradycardic (not on a beta blocker), no detected murmur.  Carotid Bruits Right Left   Negative Negative    Adominal aortic pulse is not palpable Radial  pulses are 2+ palpable  VASCULAR EXAM:                                                                                                                                           LE Pulses Right Left       FEMORAL  2+ palpable  2+ palpable        POPLITEAL  not palpable   not palpable       POSTERIOR TIBIAL  2+ palpable   2+ palpable        DORSALIS PEDIS      ANTERIOR TIBIAL 2+ palpable  2+ palpable     Gastrointestinal: soft, NTND, -G/R, - HSM, - masses palpated, - CVAT B. Musculoskeletal: M/S 5/5 throughout, Extremities without ischemic changes. Skin: No rashes, no ulcers, no cellulitis.   Neurologic: CN 2-12 intact, Pain and light touch intact in extremities are intact except, Motor exam as listed above. Psychiatric: Normal thought content, mood appropriate to clinical situation.    DATA  AAA Duplex:  Previous size: :5.0 cm  (Date: 05-23-17); Right CIA: 1.4 cm; Left CIA: 1.0 cm.   Current size:  5.3 cm( 11/21/17); Right CIA: 1.3 cm; Left CIA: 1.2 cm. 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   Technically difficult exam: air/bowel gas/obesity.   Medical Decision Making  The patient is a 77 y.o. male who presents with asymptomatic AAA with increase in size to 5.3 cm today from 5.0 cm on 05-23-17, based on limited visualization.  0.91 serum creatinine is last result on file, 02-24-17.    Based on this patient's exam and diagnostic studies, the patient will follow up in 2-4 weeks with the following studies: CTA abd/pelvis, see Dr. Donnetta Hutching afterward.  Consideration for repair of AAA would be made when the size is 5.0 cm, growth > 1 cm/yr, and symptomatic status.        The patient was given information about AAA including signs, symptoms, treatment, and how to minimize the risk of enlargement and rupture of aneurysms.    I emphasized the importance of maximal medical management including strict control of blood pressure, blood glucose,  and lipid levels, antiplatelet agents, obtaining regular exercise, and continued  cessation of smoking.   The patient was advised to call 911 should the patient experience sudden onset abdominal or back pain.   Thank you for allowing [place] to participate in this patient's care.  Clemon Chambers, RN, MSN, FNP-C [facility] [place] Office: [phone]  Clinic Physician: Scot Dock on call  11/21/2017, 11:07 AM

## 2017-12-05 ENCOUNTER — Other Ambulatory Visit (HOSPITAL_COMMUNITY): Payer: Medicare Other

## 2017-12-05 ENCOUNTER — Ambulatory Visit: Payer: Medicare Other | Admitting: Family

## 2017-12-26 ENCOUNTER — Encounter: Payer: Self-pay | Admitting: Vascular Surgery

## 2017-12-26 ENCOUNTER — Ambulatory Visit (INDEPENDENT_AMBULATORY_CARE_PROVIDER_SITE_OTHER): Payer: Medicare Other | Admitting: Vascular Surgery

## 2017-12-26 ENCOUNTER — Other Ambulatory Visit: Payer: Self-pay

## 2017-12-26 ENCOUNTER — Ambulatory Visit
Admission: RE | Admit: 2017-12-26 | Discharge: 2017-12-26 | Disposition: A | Discharge status: Home / Self Care | Payer: Medicare Other | Source: Ambulatory Visit | Attending: Vascular Surgery | Admitting: Vascular Surgery

## 2017-12-26 VITALS — BP 147/91 | HR 63 | Temp 97.7°F | Resp 20 | Ht 70.0 in | Wt 215.0 lb

## 2017-12-26 DIAGNOSIS — I713 Abdominal aortic aneurysm, ruptured, unspecified: Secondary | ICD-10-CM

## 2017-12-26 DIAGNOSIS — I714 Abdominal aortic aneurysm, without rupture: Secondary | ICD-10-CM | POA: Diagnosis not present

## 2017-12-26 MED ORDER — IOPAMIDOL (ISOVUE-370) INJECTION 76%
75.0000 mL | Freq: Once | INTRAVENOUS | Status: AC | PRN
  Administered 2017-12-26: 75 mL via INTRAVENOUS

## 2017-12-26 NOTE — Progress Notes (Signed)
[facility]  Patient name: Jeffrey [name] MRN: [id number] DOB: [birth year] Sex: male  REASON FOR VISIT: Need follow-up of infrarenal abdominal aortic aneurysm  HPI: Jeffrey [name] is a 77 y.o. male today for continued follow-up of abdominal aortic aneurysm.  His recent ultrasound showed some progression in size and he is here today for CT follow-up.  He has no symptoms referable to his aneurysm.  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  No coronary disease.  Past Medical History:  Diagnosis Date  . AAA (abdominal aortic aneurysm) ([place])   . Allergy   . Arthritis   . Colon polyps   . Coronary artery calcification   . Eczematous dermatitis   . GERD (gastroesophageal reflux disease)   . Hypertension   . Iliac artery stenosis, left ([place])   . Osteopenia     Family History  Problem Relation Age of Onset  . Diabetes Mother   . Hypertension Mother   . Stroke Mother   . Alcohol abuse Father   . Heart attack Father   . Heart disease Father   . Hypertension Father   . Cancer Sister   . Drug abuse Sister   . Hyperlipidemia Sister   . Cancer Brother   . Asthma Brother   . Arthritis Brother   . Hypertension Son     SOCIAL HISTORY: Social History   Tobacco Use  . Smoking status: Former Smoker    Packs/day: 1.00    Years: 32.00    Pack years: 32.00    Types: Cigarettes    Last attempt to quit: 08/17/1990    Years since quitting: 27.3  . Smokeless tobacco: Never Used  Substance Use Topics  . Alcohol use: Yes    Comment: Occassionally, sometimes daily sometimes weekly.     Allergies  Allergen Reactions  . Ketotifen Fumarate Swelling    Eye swelling  . Triamcinolone Rash    HIVES    Current Outpatient Medications  Medication Sig Dispense Refill  . alendronate (FOSAMAX) 70 MG tablet TAKE 1 TABLET BY MOUTH EVERY WEEK 12 tablet 0  . Ascorbic Acid (VITAMIN C)  1000 MG tablet Take 1,000 mg by mouth.    Marland Kitchen aspirin EC 81 MG tablet Take 81 mg by mouth daily.     Marland Kitchen atorvastatin (LIPITOR) 20 MG tablet Take 1 tablet (20 mg total) by mouth daily at 6 PM. 90 tablet 1  . Cyanocobalamin (RA VITAMIN B-12 TR) 1000 MCG TBCR Take 1,000 mcg by mouth daily.     . DOCOSAHEXAENOIC ACID PO Take 1 g by mouth.    . fexofenadine (ALLEGRA) 180 MG tablet Take 180 mg by mouth as needed.     . Glucosamine-Chondroitin 500-400 MG CAPS Take 2,000 mg by mouth daily.     Marland Kitchen ibuprofen (ADVIL,MOTRIN) 200 MG tablet Take 200 mg by mouth daily.     . IRON PO Take by mouth.    . losartan (COZAAR) 50 MG tablet Take 100 mg by mouth 2 (two) times daily.    . Multiple Vitamins-Minerals (CENTRUM ADULTS PO) Take by mouth daily.    . Multiple Vitamins-Minerals (MULTIVITAMIN ADULT PO) Take by mouth.    . Omega-3 1000 MG CAPS Take 3,600 mg by mouth daily.      No current facility-administered medications for this visit.     REVIEW OF SYSTEMS:  [X]  denotes positive finding, [ ]  denotes negative finding Cardiac  Comments:  Chest pain or chest pressure:    Shortness of breath upon exertion:    Short of breath when lying flat:    Irregular heart rhythm:        Vascular    Pain in calf, thigh, or hip brought on by ambulation:    Pain in feet at night that wakes you up from your sleep:     Blood clot in your veins:    Leg swelling:           PHYSICAL EXAM: Vitals:   12/26/17 1502  BP: (!) 147/91  Pulse: 63  Resp: 20  Temp: 97.7 F (36.5 C)  TempSrc: Oral  SpO2: 94%  Weight: 215 lb (97.5 kg)  Height: 5\' 10"  (1.778 m)    GENERAL: The patient is a well-nourished male, in no acute distress. The vital signs are documented above. CARDIOVASCULAR: 2+ radial and 2+ femoral pulses.  Abdomen with moderate obesity and no aneurysm palpable PULMONARY: There is good air exchange  MUSCULOSKELETAL: There are no major deformities or cyanosis. NEUROLOGIC: No focal weakness or paresthesias are  detected. SKIN: There are no ulcers or rashes noted. PSYCHIATRIC: The patient has a normal affect.  DATA:  CT scan today shows maximal diameter of 5.5 cm infrarenal bilobed aneurysm.  It does appear that his infrarenal neck is too short for stent grafting without adjuvants.  His left renal artery is the lowest and he does have somewhat of a reverse taper below this.    MEDICAL ISSUES: Gust this at length with the patient explaining that he is at the threshold of recommending surgery with a 5.5 cm aneurysm.  I will review his films with a [facility] stent graft representatives to discuss possible stent graft repair versus open repair.  We will continue the discussion with patient following this.  And that his current risk for rupture is approximately 5% annually with a 5-1/2 cm abdominal aortic aneurysm.  Splane survival would only be estimated at 20% should he have ruptured    Rosetta Posner, MD [facility] Tel [phone] Pager [phone]

## 2017-12-28 ENCOUNTER — Other Ambulatory Visit: Payer: Self-pay

## 2017-12-28 DIAGNOSIS — I713 Abdominal aortic aneurysm, ruptured, unspecified: Secondary | ICD-10-CM

## 2017-12-28 DIAGNOSIS — I714 Abdominal aortic aneurysm, without rupture, unspecified: Secondary | ICD-10-CM

## 2018-01-06 ENCOUNTER — Other Ambulatory Visit: Payer: Self-pay | Admitting: Family Medicine

## 2018-01-11 ENCOUNTER — Telehealth: Payer: Self-pay | Admitting: Vascular Surgery

## 2018-01-11 NOTE — Telephone Encounter (Signed)
sch appt spk to pt 01/29/18 9am f/u MD

## 2018-01-28 ENCOUNTER — Other Ambulatory Visit: Payer: Self-pay | Admitting: Family Medicine

## 2018-01-29 ENCOUNTER — Ambulatory Visit (INDEPENDENT_AMBULATORY_CARE_PROVIDER_SITE_OTHER)
Admission: RE | Admit: 2018-01-29 | Discharge: 2018-01-29 | Disposition: A | Discharge status: Home / Self Care | Payer: Medicare Other | Source: Ambulatory Visit | Attending: Surgery | Admitting: Surgery

## 2018-01-29 ENCOUNTER — Telehealth: Payer: Self-pay

## 2018-01-29 ENCOUNTER — Encounter: Payer: Self-pay | Admitting: Surgery

## 2018-01-29 ENCOUNTER — Ambulatory Visit (INDEPENDENT_AMBULATORY_CARE_PROVIDER_SITE_OTHER): Payer: Medicare Other | Admitting: Surgery

## 2018-01-29 ENCOUNTER — Other Ambulatory Visit: Payer: Self-pay

## 2018-01-29 ENCOUNTER — Ambulatory Visit (HOSPITAL_COMMUNITY)
Admission: RE | Admit: 2018-01-29 | Discharge: 2018-01-29 | Disposition: A | Discharge status: Home / Self Care | Payer: Medicare Other | Source: Ambulatory Visit | Attending: Surgery | Admitting: Surgery

## 2018-01-29 VITALS — BP 178/96 | HR 61 | Resp 20 | Ht 70.0 in | Wt 216.3 lb

## 2018-01-29 DIAGNOSIS — I739 Peripheral vascular disease, unspecified: Secondary | ICD-10-CM

## 2018-01-29 DIAGNOSIS — I714 Abdominal aortic aneurysm, without rupture, unspecified: Secondary | ICD-10-CM

## 2018-01-29 NOTE — Progress Notes (Signed)
[facility] of [facility]  Patient name: Jeffrey [name] MRN: [id number] DOB: 12-29-1940 Sex: male   REASON FOR VISIT:    AAA  HISOTRY OF PRESENT ILLNESS:    Jeffrey [name] is a 77 y.o. 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  He continues to be pain-free.  He continues to take a statin for hypercholesterolemia.  He is on an ARB for hypertension.  He does take an aspirin.  He is a former smoker.   PAST MEDICAL HISTORY:   Past Medical History:  Diagnosis Date  . AAA (abdominal aortic aneurysm) ([place])   . Allergy   . Arthritis   . Colon polyps   . Coronary artery calcification   . Eczematous dermatitis   . GERD (gastroesophageal reflux disease)   . Hypertension   . Iliac artery stenosis, left ([place])   . Osteopenia      FAMILY HISTORY:   Family History  Problem Relation Age of Onset  . Diabetes Mother   . Hypertension Mother   . Stroke Mother   . Alcohol abuse Father   . Heart attack Father   . Heart disease Father   . Hypertension Father   . Cancer Sister   . Drug abuse Sister   . Hyperlipidemia Sister   . Cancer Brother   . Asthma Brother   . Arthritis Brother   . Hypertension Son     SOCIAL HISTORY:   Social History   Tobacco Use  . Smoking status: Former Smoker    Packs/day: 1.00    Years: 32.00    Pack years: 32.00    Types: Cigarettes    Last attempt to quit: 08/17/1990    Years since quitting: 27.4  . Smokeless tobacco: Never Used  Substance Use Topics  . Alcohol use: Yes    Comment: Occassionally, sometimes daily sometimes weekly.      ALLERGIES:   Allergies  Allergen Reactions  . Ketotifen Fumarate Swelling    Eye swelling  . Triamcinolone Rash    HIVES     CURRENT MEDICATIONS:   Current Outpatient Medications  Medication Sig Dispense Refill  . alendronate (FOSAMAX) 70 MG tablet TAKE 1 TABLET BY MOUTH EVERY WEEK  12 tablet 0  . Ascorbic Acid (VITAMIN C) 1000 MG tablet Take 1,000 mg by mouth.    Marland Kitchen aspirin EC 81 MG tablet Take 81 mg by mouth daily.     Marland Kitchen atorvastatin (LIPITOR) 20 MG tablet TAKE ONE TABLET BY MOUTH AT 6PM 90 tablet 0  . Cyanocobalamin (RA VITAMIN B-12 TR) 1000 MCG TBCR Take 1,000 mcg by mouth daily.     . DOCOSAHEXAENOIC ACID PO Take 1 g by mouth.    . fexofenadine (ALLEGRA) 180 MG tablet Take 180 mg by mouth as needed.     . Glucosamine-Chondroitin 500-400 MG CAPS Take 2,000 mg by mouth daily.     Marland Kitchen ibuprofen (ADVIL,MOTRIN) 200 MG tablet Take 200 mg by mouth daily.     . IRON PO Take by mouth.    . losartan (COZAAR) 50 MG tablet Take 100 mg by mouth 2 (two) times daily.    . Multiple Vitamins-Minerals (CENTRUM ADULTS PO) Take by mouth daily.    . Multiple Vitamins-Minerals (MULTIVITAMIN ADULT PO) Take by mouth.    . Omega-3 1000 MG CAPS Take 3,600 mg by mouth daily.      No current facility-administered medications for this visit.  REVIEW OF SYSTEMS:   [X]  denotes positive finding, [ ]  denotes negative finding Cardiac  Comments:  Chest pain or chest pressure:    Shortness of breath upon exertion:    Short of breath when lying flat:    Irregular heart rhythm:        Vascular    Pain in calf, thigh, or hip brought on by ambulation:    Pain in feet at night that wakes you up from your sleep:     Blood clot in your veins:    Leg swelling:         Pulmonary    Oxygen at home:    Productive cough:     Wheezing:         Neurologic    Sudden weakness in arms or legs:     Sudden numbness in arms or legs:     Sudden onset of difficulty speaking or slurred speech:    Temporary loss of vision in one eye:     Problems with dizziness:         Gastrointestinal    Blood in stool:     Vomited blood:         Genitourinary    Burning when urinating:     Blood in urine:        Psychiatric    Major depression:         Hematologic    Bleeding problems:    Problems with  blood clotting too easily:        Skin    Rashes or ulcers:        Constitutional    Fever or chills:      PHYSICAL EXAM:   Vitals:   01/29/18 0853  BP: (!) 178/96  Pulse: 61  Resp: 20  SpO2: 97%  Weight: 216 lb 4.8 oz (98.1 kg)  Height: 5\' 10"  (1.778 m)    GENERAL: The patient is a well-nourished male, in no acute distress. The vital signs are documented above. CARDIAC: There is a regular rate and rhythm.  VASCULAR: I could not palpate pedal pulses. PULMONARY: Non-labored respirations ABDOMEN: Soft and non-tender with normal pitched bowel sounds.  MUSCULOSKELETAL: There are no major deformities or cyanosis. NEUROLOGIC: No focal weakness or paresthesias are detected. SKIN: There are no ulcers or rashes noted. PSYCHIATRIC: The patient has a normal affect.  STUDIES:   I have reviewed his CT scan which shows a 5.5 cm juxtarenal abdominal aortic aneurysm.  I spent 1.5 hours on Tera Recon creating his fenestrated stent graft.  MEDICAL ISSUES:   AAA: We discussed that his aneurysm could could be treated either with open repair or fenestrated endovascular repair.  I went over the details of each operation.  Ultimately he felt more comfortable proceeding with a endovascular repair.  Specific to this operation I discussed the risk of renal injury, intestinal ischemia, and lower extremity ischemia.  All of his questions were answered.  I am going to get carotid duplex, ABIs and popliteal duplex for preoperative imaging.  He will also be referred to cardiology for formal clearance.  He was placed on the schedule once his device has been received.    [name], MD [facility] of [facility] [phone] Pager [phone]

## 2018-01-29 NOTE — Telephone Encounter (Signed)
Primary Cardiologist: [facility] Pre-operative Risk Assessment    Request for surgical clearance:  1. What type of surgery is being performed? [place]  2. When is this surgery scheduled? TBD   3. What type of clearance is required (medical clearance vs. Pharmacy clearance to hold med vs. Both)?Cardiac clearance  4. Are there any medications that need to be held prior to surgery and how long? N/A  5. Practice name and name of physician performing surgery? [facility]/ Dr.Brabham  6. What is your office phone number [phone]   7.   What is your office fax number [phone]  8.   Anesthesia type (None, local, MAC, general) ? Not listed   Jeffrey [name] 01/29/2018, 4:56 PM  _________________________________________________________________   (provider comments below)

## 2018-01-30 ENCOUNTER — Telehealth: Payer: Self-pay | Admitting: Cardiology

## 2018-01-30 NOTE — Telephone Encounter (Signed)
   Primary Cardiologist:No primary care provider on file.  Chart reviewed as part of pre-operative protocol coverage. Because of Jarquez Mestre past medical history he/she will require an office visit in order to better assess preoperative cardiovascular risk.  Pre-op covering staff: - Please schedule appointment and call patient to inform them. - Please contact requesting surgeon's office via preferred method (i.e, phone, fax) to inform them of need for appointment prior to surgery.  Kerin Ransom, PA-C  01/30/2018, 3:54 PM

## 2018-01-30 NOTE — Telephone Encounter (Signed)
Received a Surgical  Procedures Clearance from [facility] on 01/30/18, Appt 02/02/18 @ 10:00AM.[place]

## 2018-01-30 NOTE — Telephone Encounter (Signed)
Patient already has an appt Friday 02/02/2018 with Dr Harrell Gave; patient voiced understanding.

## 2018-01-31 ENCOUNTER — Other Ambulatory Visit: Payer: Self-pay | Admitting: *Deleted

## 2018-01-31 NOTE — Progress Notes (Signed)
Patient called and instructed to be at [facility] admitting department at 6:30 am on 03/21/18 for surgery. NPO past MN night prior and to expect a call and follow the detailed instructions received from the hospital for this surgery.Instructed to continue ASA.  Verbalized understanding.

## 2018-02-02 ENCOUNTER — Encounter: Payer: Self-pay | Admitting: Cardiology

## 2018-02-02 ENCOUNTER — Ambulatory Visit: Payer: Medicare Other | Admitting: Cardiology

## 2018-02-02 VITALS — BP 170/98 | HR 57 | Ht 70.0 in | Wt 213.2 lb

## 2018-02-02 DIAGNOSIS — Z7189 Other specified counseling: Secondary | ICD-10-CM

## 2018-02-02 DIAGNOSIS — I10 Essential (primary) hypertension: Secondary | ICD-10-CM

## 2018-02-02 DIAGNOSIS — I714 Abdominal aortic aneurysm, without rupture, unspecified: Secondary | ICD-10-CM

## 2018-02-02 DIAGNOSIS — Z79899 Other long term (current) drug therapy: Secondary | ICD-10-CM

## 2018-02-02 DIAGNOSIS — Z01818 Encounter for other preprocedural examination: Secondary | ICD-10-CM

## 2018-02-02 MED ORDER — ATORVASTATIN CALCIUM 40 MG PO TABS: 40.0000 mg | ORAL_TABLET | Freq: Every day | ORAL | 3 refills | Status: DC

## 2018-02-02 NOTE — Progress Notes (Signed)
Cardiology Office Note:    Date:  02/02/2018   ID:  Jeffrey [name], DOB 12/27/1940, MRN [id number]  PCP:  Vivi Barrack, MD  Cardiologist:  Buford Dresser, MD PhD  Referring MD: Vivi Barrack, MD   Chief complaint: preoperative cardiovascular risk assessment  History of Present Illness:    Jeffrey [name] is a 77 y.o. male with a hx of AAA, hypertension, coronary artery calcification who is seen as a new consult at the request of Dr. [name] for preoperative evaluation prior to vascular surgery (FEVAR, Dr. Trula Slade). Surgery planned for October 2nd.   Patient information: Has never been told that he has any issues with the heart. Never has had chest pain. Short of breath only with significant exercise, beyond his usual. Walks two miles daily, sometimes up to 3 or 4 miles. Had to stop running 7-8 years ago because of arthritis. Can climb stairs routinely, has a two story townhouse and climbs two flights of stairs multiple times per day. Has no limiting shortness of breath with his routine exertion.   Checks blood pressure at home, varies somewhat. Last week 114/78, yesterday it was 130/82. Resting heart rate in the mid 50s. His blood pressure tends to be high in the office and lower at home. He has had his cuff measured at the office and reports that it is the same as the office cuff.   Never been told and never felt an irregular heartbeat. Overall no chest pain, palpitations, syncope, PND, orthopnea, LE edema.   Quit smoking 27 years ago. Overall in good health, no concerns.  Past Medical History:  Diagnosis Date  . AAA (abdominal aortic aneurysm) ([place])   . Allergy   . Arthritis   . Colon polyps   . Coronary artery calcification   . Eczematous dermatitis   . GERD (gastroesophageal reflux disease)   . Hypertension   . Iliac artery stenosis, left ([place])   . Osteopenia     Past Surgical History:  Procedure Laterality Date  . Basal Skin Cancer  2012  . CATARACT EXTRACTION  Bilateral   . TONSILLECTOMY    . TRANSURETHRAL RESECTION OF PROSTATE      Current Medications: Current Outpatient Medications on File Prior to Visit  Medication Sig  . alendronate (FOSAMAX) 70 MG tablet TAKE 1 TABLET BY MOUTH EVERY WEEK  . Ascorbic Acid (VITAMIN C) 1000 MG tablet Take 500 mg by mouth.   Marland Kitchen aspirin EC 81 MG tablet Take 81 mg by mouth daily.   . Cyanocobalamin (RA VITAMIN B-12 TR) 1000 MCG TBCR Take 1,000 mcg by mouth daily.   . fexofenadine (ALLEGRA) 180 MG tablet Take 180 mg by mouth as needed.   . Glucosamine-Chondroitin 500-400 MG CAPS Take 2,000 mg by mouth daily.   Marland Kitchen ibuprofen (ADVIL,MOTRIN) 200 MG tablet Take 200 mg by mouth daily.   . IRON PO Take 30 mg by mouth once a week.   . losartan (COZAAR) 25 MG tablet Take 25 mg by mouth 2 (two) times daily.  . Multiple Vitamins-Minerals (CENTRUM ADULTS PO) Take by mouth daily.  . Omega-3 1000 MG CAPS Take 3,000 mg by mouth daily.    No current facility-administered medications on file prior to visit.      Allergies:   Ketotifen fumarate and Triamcinolone   Social History   Socioeconomic History  . Marital status: Divorced    Spouse name: Not on file  . Number of children: 1  . Years of education: Not on file  .  Highest education level: Not on file  Occupational History  . Occupation: Retired    Fish farm manager: [employer]  . Financial resource strain: Not on file  . Food insecurity:    Worry: Not on file    Inability: Not on file  . Transportation needs:    Medical: Not on file    Non-medical: Not on file  Tobacco Use  . Smoking status: Former Smoker    Packs/day: 1.00    Years: 32.00    Pack years: 32.00    Types: Cigarettes    Last attempt to quit: 08/17/1990    Years since quitting: 27.4  . Smokeless tobacco: Never Used  Substance and Sexual Activity  . Alcohol use: Yes    Comment: Occassionally, sometimes daily sometimes weekly.   . Drug use: No  . Sexual activity: Yes     Partners: Female  Lifestyle  . Physical activity:    Days per week: Not on file    Minutes per session: Not on file  . Stress: Not on file  Relationships  . Social connections:    Talks on phone: Not on file    Gets together: Not on file    Attends religious service: Not on file    Active member of club or organization: Not on file    Attends meetings of clubs or organizations: Not on file    Relationship status: Not on file  Other Topics Concern  . Not on file  Social History Narrative  . Not on file     Family History: The patient's family history includes Alcohol abuse in his father; Arthritis in his brother; Asthma in his brother; Cancer in his brother and sister; Diabetes in his mother; Drug abuse in his sister; Heart attack in his father; Heart disease in his father; Hyperlipidemia in his sister; Hypertension in his father, mother, and son; Stroke in his mother.  ROS:   Please see the history of present illness.  Additional pertinent ROS: Review of Systems  Constitutional: Negative for chills, fever and malaise/fatigue.  HENT: Negative for ear pain and hearing loss.   Eyes: Negative for blurred vision and pain.  Respiratory: Negative for cough, shortness of breath and wheezing.   Cardiovascular: Negative for chest pain, palpitations, orthopnea, claudication, leg swelling and PND.  Gastrointestinal: Negative for abdominal pain, blood in stool and melena.  Genitourinary: Negative for dysuria and hematuria.  Musculoskeletal: Positive for joint pain. Negative for falls.  Skin: Negative for rash.  Neurological: Negative for focal weakness and loss of consciousness.  Endo/Heme/Allergies: Does not bruise/bleed easily.   EKGs/Labs/Other Studies Reviewed:    The following studies were reviewed today: Prior notes. No prior ECGs, stress tests, or echo in the system.  EKG:  EKG is ordered today.  The ekg ordered today demonstrates sinus bradycardia.  Recent Labs: 02/24/2017: ALT  21; BUN 10; Creat 0.91; Hemoglobin 13.2; Platelets 220; Potassium 4.4; Sodium 136  Recent Lipid Panel    Component Value Date/Time   CHOL 134 02/24/2017 1445   TRIG 77 02/24/2017 1445   HDL 72 02/24/2017 1445   CHOLHDL 1.9 02/24/2017 1445   LDLCALC 46 02/24/2017 1445    Physical Exam:    VS:  BP (!) 170/98 (BP Location: Left Arm)   Pulse (!) 57   Ht 5\' 10"  (1.778 m)   Wt 213 lb 3.2 oz (96.7 kg)   BMI 30.59 kg/m     Wt Readings from Last 3 Encounters:  02/02/18 213 lb 3.2 oz (96.7 kg)  01/29/18 216 lb 4.8 oz (98.1 kg)  12/26/17 215 lb (97.5 kg)     GEN: Well nourished, well developed in no acute distress HEENT: Normal NECK: No JVD; No carotid bruits LYMPHATICS: No lymphadenopathy CARDIAC: regular rhythm, normal S1 and S2, no murmurs, rubs, gallops. Radial and DP pulses 2+ bilaterally. RESPIRATORY:  Clear to auscultation without rales, wheezing or rhonchi  ABDOMEN: Soft, non-tender, non-distended MUSCULOSKELETAL:  No edema; No deformity  SKIN: Warm and dry NEUROLOGIC:  Alert and oriented x 3 PSYCHIATRIC:  Normal affect   ASSESSMENT:    1. Pre-op examination   2. Essential hypertension   3. Counseling on health promotion and disease prevention   4. Medication management   5. AAA (abdominal aortic aneurysm) without rupture ([facility])   6. Elevated blood pressure reading in office with white coat syndrome, with diagnosis of hypertension    PLAN:    1. Preoperative cardiovascular risk assessment before endovascular stent graft repair of abdominal aortic aneurysm His RCRI risk is 1 pt, only for high risk vascular surgery. He has no patient specific cardiovascular risk factors.  According to ACC/AHA guidelines, no further cardiovascular testing needed.  The patient may proceed to surgery at acceptable risk.    2. Hypertension: has been following this with Dr. [name] extensively. Has home logs, has had his BP machine tested. Home readings are well controlled, and he is  consistently elevated in the office. This is consistent with white coat hypertension. Will not make any changes at this time.  3. Cardiovascular risk evaluation and counseling:  Hypertension: as discussed above  Diabetes: none. A1c 4.8%  Prior CAD: asymptomatic, no history of diagnosed CAD. Has calcification on imaging  PAD: does have AAA, so he qualifies for secondary prevention. Given this, despite his low cholesterol numbers, he should be on a high intensity statin. He tolerated 20 mg atorvastatin without issue. Will increase to 40 mg dosing.  Lipids: on atorva 20 mg, LDL 46, HDL 72, TG 77. Excellent control. There is no disadvantage to lowering LDL even with good control, so given risk noted above would increase to high intensity statin  Tobacco: former smoker  Plan for follow up: annually for secondary prevention  Medication Adjustments/Labs and Tests Ordered: Current medicines are reviewed at length with the patient today.  Concerns regarding medicines are outlined above.  Orders Placed This Encounter  Procedures  . EKG 12-Lead   Meds ordered this encounter  Medications  . atorvastatin (LIPITOR) 40 MG tablet    Sig: Take 1 tablet (40 mg total) by mouth daily at 6 PM.    Dispense:  90 tablet    Refill:  3    Patient Instructions  Medication Instructions: Your physician recommends that you make the following changes to your medication.  Increase: Atorvastatin 40 mg every evening  If you need a refill on your cardiac medications before your next appointment, please call your pharmacy.   Labwork: None  Procedures/Testing: None  Follow-Up: Your physician wants you to follow-up in 1 year with Dr. Harrell Gave. You will receive a reminder letter in the mail two months in advance. If you don't receive a letter, please call our office at [phone] to schedule this follow-up appointment.   Special Instructions:    Thank you for choosing [facility] at [facility]!!         Signed, Buford Dresser, MD PhD 02/02/2018 12:46 PM    [place]

## 2018-02-02 NOTE — Patient Instructions (Signed)
Medication Instructions: Your physician recommends that you make the following changes to your medication.  Increase: Atorvastatin 40 mg every evening  If you need a refill on your cardiac medications before your next appointment, please call your pharmacy.   Labwork: None  Procedures/Testing: None  Follow-Up: Your physician wants you to follow-up in 1 year with Dr. Harrell Gave. You will receive a reminder letter in the mail two months in advance. If you don't receive a letter, please call our office at [phone] to schedule this follow-up appointment.   Special Instructions:    Thank you for choosing [facility] at [facility]!!

## 2018-02-03 ENCOUNTER — Other Ambulatory Visit: Payer: Self-pay | Admitting: Family Medicine

## 2018-02-13 ENCOUNTER — Telehealth: Payer: Self-pay

## 2018-02-13 NOTE — Telephone Encounter (Signed)
Primary Cardiologist: Dr. [facility] Pre-operative Risk Assessment    Request for surgical clearance:  1. What type of surgery is being performed? FEVAR    2. When is this surgery scheduled? TBD   3. What type of clearance is required (medical clearance vs. Pharmacy clearance to hold med vs. Both)?  Cardiac  4. Are there any medications that need to be held prior to surgery and how long? Not listed   5. Practice name and name of physician performing surgery? [facility] - Dr. Trula Slade  6. What is your office phone number [phone]    7.   What is your office fax number [phone] attn: Jacqlyn Larsen  8.   Anesthesia type (None, local, MAC, general) ? Not listed   Lamar Laundry 02/13/2018, 8:29 AM  _________________________________________________________________   (provider comments below)

## 2018-02-14 NOTE — Telephone Encounter (Signed)
   Primary Cardiologist: Buford Dresser, MD  Chart reviewed as part of pre-operative protocol coverage.   He was recently seen by Dr. Harrell Gave who evaluated him.  Jeffrey [name] would be at acceptable risk for the planned procedure without further cardiovascular testing.   I will route this recommendation to the requesting party via Epic fax function and remove from pre-op pool.  Please call with questions.  Rosaria Ferries, PA-C 02/14/2018, 5:36 PM  5.  [facility] - Dr. Trula Slade  6. What is your office phone number [phone]    7.   What is your office fax number [phone] attn: Jacqlyn Larsen

## 2018-02-28 ENCOUNTER — Ambulatory Visit (INDEPENDENT_AMBULATORY_CARE_PROVIDER_SITE_OTHER): Payer: Medicare Other

## 2018-02-28 VITALS — BP 128/94 | HR 57 | Ht 70.0 in | Wt 210.6 lb

## 2018-02-28 DIAGNOSIS — Z0000 Encounter for general adult medical examination without abnormal findings: Secondary | ICD-10-CM | POA: Diagnosis not present

## 2018-02-28 NOTE — Patient Instructions (Signed)
Jeffrey [name] , Thank you for taking time to come for your Medicare Wellness Visit. I appreciate your ongoing commitment to your health goals. Please review the following plan we discussed and let me know if I can assist you in the future.   These are the goals we discussed: Goals    . Weight (lb) < 200 lb (90.7 kg)       This is a list of the screening recommended for you and due dates:  Health Maintenance  Topic Date Due  . Tetanus Vaccine  02/08/1960  . Flu Shot  05/20/2018*  . Pneumonia vaccines  Completed  *Topic was postponed. The date shown is not the original due date.   Preventive Care for Adults  A healthy lifestyle and preventive care can promote health and wellness. Preventive health guidelines for adults include the following key practices.  . A routine yearly physical is a good way to check with your health care provider about your health and preventive screening. It is a chance to share any concerns and updates on your health and to receive a thorough exam.  . Visit your dentist for a routine exam and preventive care every 6 months. Brush your teeth twice a day and floss once a day. Good oral hygiene prevents tooth decay and gum disease.  . The frequency of eye exams is based on your age, health, family medical history, use  of contact lenses, and other factors. Follow your health care provider's recommendations for frequency of eye exams.  . Eat a healthy diet. Foods like vegetables, fruits, whole grains, low-fat dairy products, and lean protein foods contain the nutrients you need without too many calories. Decrease your intake of foods high in solid fats, added sugars, and salt. Eat the right amount of calories for you. Get information about a proper diet from your health care provider, if necessary.  . Regular physical exercise is one of the most important things you can do for your health. Most adults should get at least 150 minutes of moderate-intensity exercise (any  activity that increases your heart rate and causes you to sweat) each week. In addition, most adults need muscle-strengthening exercises on 2 or more days a week.  Silver Sneakers may be a benefit available to you. To determine eligibility, you may visit the website: www.silversneakers.com or contact program at (662)206-3056 Mon-Fri between 8AM-8PM.   . Maintain a healthy weight. The body mass index (BMI) is a screening tool to identify possible weight problems. It provides an estimate of body fat based on height and weight. Your health care provider can find your BMI and can help you achieve or maintain a healthy weight.   For adults 20 years and older: ? A BMI below 18.5 is considered underweight. ? A BMI of 18.5 to 24.9 is normal. ? A BMI of 25 to 29.9 is considered overweight. ? A BMI of 30 and above is considered obese.   . Maintain normal blood lipids and cholesterol levels by exercising and minimizing your intake of saturated fat. Eat a balanced diet with plenty of fruit and vegetables. Blood tests for lipids and cholesterol should begin at age 66 and be repeated every 5 years. If your lipid or cholesterol levels are high, you are over 50, or you are at high risk for heart disease, you may need your cholesterol levels checked more frequently. Ongoing high lipid and cholesterol levels should be treated with medicines if diet and exercise are not working.  . If  you smoke, find out from your health care provider how to quit. If you do not use tobacco, please do not start.  . If you choose to drink alcohol, please do not consume more than 2 drinks per day. One drink is considered to be 12 ounces (355 mL) of beer, 5 ounces (148 mL) of wine, or 1.5 ounces (44 mL) of liquor.  . If you are 88-64 years old, ask your health care provider if you should take aspirin to prevent strokes.  . Use sunscreen. Apply sunscreen liberally and repeatedly throughout the day. You should seek shade when your  shadow is shorter than you. Protect yourself by wearing long sleeves, pants, a wide-brimmed hat, and sunglasses year round, whenever you are outdoors.  . Once a month, do a whole body skin exam, using a mirror to look at the skin on your back. Tell your health care provider of new moles, moles that have irregular borders, moles that are larger than a pencil eraser, or moles that have changed in shape or color.

## 2018-02-28 NOTE — Progress Notes (Signed)
Subjective:   Jeffrey [name] is a 77 y.o. male who presents for Medicare Annual/Subsequent preventive examination.  Review of Systems:  No ROS.  Medicare Wellness Visit. Additional risk factors are reflected in the social history. Cardiac Risk Factors include: male gender;advanced age (>60men, >48 women)  Patient lives in a two story town home. He is single. His son and the sons family lives near by. His brother lives near by. Patient [place], walking, reading, hanging out with family, enjoys movies. Member of the [employer].  Patient goes to bed around 10-11pm. Gets up 2-3 times a night to go to the bathroom. Wears a mouth guard for teeth grinding. Patient gets up around 8-8:30. Feels rested when he wakes up. Objective:    Vitals: BP (!) 128/94 (BP Location: Left Arm, Patient Position: Sitting, Cuff Size: Large)   Pulse (!) 57   Ht 5\' 10"  (1.778 m)   Wt 210 lb 9.6 oz (95.5 kg)   SpO2 97%   BMI 30.22 kg/m   Body mass index is 30.22 kg/m.  Advanced Directives 02/28/2018 12/26/2017 02/27/2017 05/10/2016 10/27/2015 08/18/2015  Does Patient Have a Medical Advance Directive? No No No No No No  Would patient like information on creating a medical advance directive? Yes (MAU/Ambulatory/Procedural Areas - Information given) - Yes (MAU/Ambulatory/Procedural Areas - Information given) - - Yes - Educational materials given   Per the order of the physician Briscoe Deutscher, DO, and with the consent of the patient 20 minutes was spent discussing Advanced Directives. Patient was provided with the Advanced Directive packet here in the office. We reviewed over [place] and [employer]. Instructions provided for how to complete forms, including witness signatures and having the forms notarized. Instructed patient to provide office with a copy to be scanned in to chart upon completion. Patient verbalized understanding Tobacco Social History   Tobacco Use  Smoking  Status Former Smoker  . Packs/day: 1.00  . Years: 32.00  . Pack years: 32.00  . Types: Cigarettes  . Last attempt to quit: 08/17/1990  . Years since quitting: 27.5  Smokeless Tobacco Never Used     Counseling given: Not Answered     Past Medical History:  Diagnosis Date  . AAA (abdominal aortic aneurysm) ([place])   . Allergy   . Arthritis   . Colon polyps   . Coronary artery calcification   . Eczematous dermatitis   . GERD (gastroesophageal reflux disease)   . Hypertension   . Iliac artery stenosis, left ([place])   . Osteopenia    Past Surgical History:  Procedure Laterality Date  . Basal Skin Cancer  2012  . CATARACT EXTRACTION Bilateral   . TONSILLECTOMY    . TRANSURETHRAL RESECTION OF PROSTATE     Family History  Problem Relation Age of Onset  . Diabetes Mother   . Hypertension Mother   . Stroke Mother   . Alcohol abuse Father   . Heart attack Father   . Heart disease Father   . Hypertension Father   . Cancer Sister   . Hyperlipidemia Sister   . Pancreatic cancer Sister   . Pancreatic cancer Brother   . Hypertension Son    Social History   Socioeconomic History  . Marital status: Divorced    Spouse name: Not on file  . Number of children: 1  . Years of education: Not on file  . Highest education level: Not on file  Occupational History  . Occupation:  Retired    Fish farm manager: [employer]  . Financial resource strain: Not on file  . Food insecurity:    Worry: Not on file    Inability: Not on file  . Transportation needs:    Medical: Not on file    Non-medical: Not on file  Tobacco Use  . Smoking status: Former Smoker    Packs/day: 1.00    Years: 32.00    Pack years: 32.00    Types: Cigarettes    Last attempt to quit: 08/17/1990    Years since quitting: 27.5  . Smokeless tobacco: Never Used  Substance and Sexual Activity  . Alcohol use: Yes    Comment: Occassionally, sometimes daily sometimes weekly.   . Drug use: No  . Sexual  activity: Yes    Partners: Female  Lifestyle  . Physical activity:    Days per week: Not on file    Minutes per session: Not on file  . Stress: Not on file  Relationships  . Social connections:    Talks on phone: Not on file    Gets together: Not on file    Attends religious service: Not on file    Active member of club or organization: Not on file    Attends meetings of clubs or organizations: Not on file    Relationship status: Not on file  Other Topics Concern  . Not on file  Social History Narrative  . Not on file    Outpatient Encounter Medications as of 02/28/2018  Medication Sig  . alendronate (FOSAMAX) 70 MG tablet TAKE 1 TABLET BY MOUTH EVERY WEEK  . Ascorbic Acid (VITAMIN C) 1000 MG tablet Take 500 mg by mouth.   Marland Kitchen aspirin EC 81 MG tablet Take 81 mg by mouth daily.   Marland Kitchen atorvastatin (LIPITOR) 40 MG tablet Take 1 tablet (40 mg total) by mouth daily at 6 PM.  . Cyanocobalamin (RA VITAMIN B-12 TR) 1000 MCG TBCR Take 1,000 mcg by mouth daily.   . fexofenadine (ALLEGRA) 180 MG tablet Take 180 mg by mouth as needed.   . Glucosamine-Chondroitin 500-400 MG CAPS Take 2,000 mg by mouth daily.   . IRON PO Take 30 mg by mouth once a week.   . losartan (COZAAR) 25 MG tablet TAKE 1 TABLET BY MOUTH TWICE DAILY  . Multiple Vitamins-Minerals (CENTRUM ADULTS PO) Take by mouth daily.  . Omega-3 1000 MG CAPS Take 3,000 mg by mouth daily.   . Calcium Carbonate-Vitamin D (CALCIUM 600+D) 600-200 MG-UNIT TABS calcium  . [DISCONTINUED] ibuprofen (ADVIL,MOTRIN) 200 MG tablet Take 200 mg by mouth daily.   . [DISCONTINUED] losartan (COZAAR) 25 MG tablet Take 25 mg by mouth 2 (two) times daily.   No facility-administered encounter medications on file as of 02/28/2018.     Activities of Daily Living In your present state of health, do you have any difficulty performing the following activities: 02/28/2018  Hearing? Y  Comment Discussed going to the [place] for hearing test/aids  Vision? N    Difficulty concentrating or making decisions? N  Walking or climbing stairs? N  Dressing or bathing? N  Doing errands, shopping? N  Preparing Food and eating ? N  Using the Toilet? N  In the past six months, have you accidently leaked urine? N  Do you have problems with loss of bowel control? N  Managing your Medications? N  Managing your Finances? N  Housekeeping or managing your Housekeeping? N  Comment Has a house  keeper that comes once a month  Some recent data might be hidden    Patient Care Team: Vivi Barrack, MD as PCP - General (Family Medicine) Buford Dresser, MD as PCP - Cardiology (Cardiology) Chevis Pretty as Consulting Physician (Dentistry)   Assessment:   This is a routine wellness examination for [facility].  Exercise Activities and Dietary recommendations Current Exercise Habits: Structured exercise class([employer]), Type of exercise: walking;strength training/weights, Time (Minutes): 40, Frequency (Times/Week): 7, Weekly Exercise (Minutes/Week): 280, Intensity: Mild, Exercise limited by: None identified   Breakfast: [employer] with coffee (half and half and sweet and low)  Lunch: 3 apples with water to drink  Dinner: salad with lite ranch dressing, water to drink  Occasional candy, not frequent Goals    . Weight (lb) < 200 lb (90.7 kg)       Fall Risk Fall Risk  02/28/2018 02/27/2017 02/24/2017  Falls in the past year? No Yes -  Number falls in past yr: - 1 1  Injury with Fall? - No No  Follow up - Education provided -    Depression Screen PHQ 2/9 Scores 02/28/2018 02/27/2017 02/24/2017  PHQ - 2 Score 0 0 0    Cognitive Function MMSE - Mini Mental State Exam 02/27/2017  Orientation to time 5  Orientation to Place 5  Registration 3  Attention/ Calculation 3  Recall 2  Language- name 2 objects 2  Language- repeat 1  Language- follow 3 step command 3  Language- read & follow direction 1  Write a sentence 1  Copy design 1  Total score  27     6CIT Screen 02/28/2018  What Year? 0 points  What month? 0 points  What time? 0 points  Count back from 20 0 points  Months in reverse 0 points  Repeat phrase 10 points  Total Score 10    Immunization History  Administered Date(s) Administered  . Influenza, High Dose Seasonal PF 04/26/2017  . Pneumococcal Conjugate-13 09/24/2014  . Pneumococcal Polysaccharide-23 04/26/2017      Screening Tests Health Maintenance  Topic Date Due  . TETANUS/TDAP  02/08/1960  . INFLUENZA VACCINE  05/20/2018 (Originally 01/18/2018)  . PNA vac Low Risk Adult  Completed         Plan:    Follow Up with PCP as advised  I have personally reviewed and noted the following in the patient's chart:   . Medical and social history . Use of alcohol, tobacco or illicit drugs  . Current medications and supplements . Functional ability and status . Nutritional status . Physical activity . Advanced directives . List of other physicians . Vitals . Screenings to include cognitive, depression, and falls . Referrals and appointments  In addition, I have reviewed and discussed with patient certain preventive protocols, quality metrics, and best practice recommendations. A written personalized care plan for preventive services as well as general preventive health recommendations were provided to patient.     [place], [place]  3/[id number]/8768

## 2018-02-28 NOTE — Progress Notes (Signed)
PCP notes: Last OV 04/26/2017    Health maintenance: Tdap   Abnormal screenings: Blood pressure and 6CIT. Could not recall phrase.   Patient concerns: Patient brought their home blood pressure readings. I placed on Dr. Ellwood Handler desk for him to review.   Nurse concerns: Patient was unable to remember any of the phrase that was provided for 6CIT. Diastolic blood pressure reading elevated. Patient states he has white coat syndrome. Home readings look lower than office reading.   Next PCP appt: 03/14/2018

## 2018-03-01 ENCOUNTER — Encounter: Payer: Self-pay | Admitting: Family Medicine

## 2018-03-02 NOTE — Progress Notes (Signed)
I have personally reviewed the Medicare Annual Wellness questionnaire and have noted 1. The patient's medical and social history 2. Their use of alcohol, tobacco or illicit drugs 3. Their current medications and supplements 4. The patient's functional ability including ADL's, fall risks, home safety risks and hearing or visual impairment. 5. Diet and physical activities 6. Evidence for depression or mood disorders 7. Reviewed Updated provider list, see scanned forms and CH[facility] Snapshot.   The patients weight, height, BMI and visual acuity have been recorded in the chart I have made referrals, counseling and provided education to the patient based review of the above and I have provided the pt with a written personalized care plan for preventive services.  I have provided the patient with a copy of your personalized plan for preventive services. Instructed to take the time to review along with their updated medication list.  Jeffrey Krahenbuhl, DO  

## 2018-03-08 ENCOUNTER — Encounter: Payer: Medicare Other | Admitting: Family Medicine

## 2018-03-12 NOTE — Pre-Procedure Instructions (Signed)
Couper [place]  03/12/2018      [id number] [address], [address] AT [facility] OF [address] [place] [address] Phone: [phone] Fax: [phone]    Your procedure is scheduled on Wednesday October 2.  Report to [facility] Admitting at 6:30 A.M.  Call this number if you have problems the morning of surgery:  [phone]   Remember:  Do not eat or drink after midnight.    Take these medicines the morning of surgery with A SIP OF WATER:   Acetaminophen (tylenol) if needed Fexofenadine (Allegra) if needed Flonase   7 days prior to surgery STOP taking any Aleve, Naproxen, Ibuprofen, Motrin, Advil, Goody's, BC's, all herbal medications, fish oil, and all vitamins  FOLLOW your surgeon's instructions on whether to stop taking Aspirin.    Do not wear jewelry  Do not wear lotions, powders, or colognes, or deodorant.  Do not shave 48 hours prior to surgery.  Men may shave face and neck.  Do not bring valuables to the hospital.  [facility] is not responsible for any belongings or valuables.  Contacts, dentures or bridgework may not be worn into surgery.  Leave your suitcase in the car.  After surgery it may be brought to your room.  For patients admitted to the hospital, discharge time will be determined by your treatment team.  Patients discharged the day of surgery will not be allowed to drive home.   Special instructions:   [place]- Preparing For Surgery  Before surgery, you can play an important role. Because skin is not sterile, your skin needs to be as free of germs as possible. You can reduce the number of germs on your skin by washing with CHG (chlorahexidine gluconate) Soap before surgery.  CHG is an antiseptic cleaner which kills germs and bonds with the skin to continue killing germs even after washing.    Oral Hygiene is also important to reduce your risk of infection.  Remember - BRUSH  YOUR TEETH THE MORNING OF SURGERY WITH YOUR REGULAR TOOTHPASTE  Please do not use if you have an allergy to CHG or antibacterial soaps. If your skin becomes reddened/irritated stop using the CHG.  Do not shave (including legs and underarms) for at least 48 hours prior to first CHG shower. It is OK to shave your face.  Please follow these instructions carefully.   1. Shower the NIGHT BEFORE SURGERY and the MORNING OF SURGERY with CHG.   2. If you chose to wash your hair, wash your hair first as usual with your normal shampoo.  3. After you shampoo, rinse your hair and body thoroughly to remove the shampoo.  4. Use CHG as you would any other liquid soap. You can apply CHG directly to the skin and wash gently with a scrungie or a clean washcloth.   5. Apply the CHG Soap to your body ONLY FROM THE NECK DOWN.  Do not use on open wounds or open sores. Avoid contact with your eyes, ears, mouth and genitals (private parts). Wash Face and genitals (private parts)  with your normal soap.  6. Wash thoroughly, paying special attention to the area where your surgery will be performed.  7. Thoroughly rinse your body with warm water from the neck down.  8. DO NOT shower/wash with your normal soap after using and rinsing off the CHG Soap.  9. Pat yourself dry with a CLEAN  TOWEL.  10. Wear CLEAN PAJAMAS to bed the night before surgery, wear comfortable clothes the morning of surgery  11. Place CLEAN SHEETS on your bed the night of your first shower and DO NOT SLEEP WITH PETS.    Day of Surgery:  Do not apply any deodorants/lotions.  Please wear clean clothes to the hospital/surgery center.   Remember to brush your teeth WITH YOUR REGULAR TOOTHPASTE.    Please read over the following fact sheets that you were given. Coughing and Deep Breathing, MRSA Information and Surgical Site Infection Prevention

## 2018-03-13 ENCOUNTER — Encounter (HOSPITAL_COMMUNITY)
Admission: RE | Admit: 2018-03-13 | Discharge: 2018-03-13 | Disposition: A | Discharge status: Home / Self Care | Payer: Medicare Other | Source: Ambulatory Visit | Attending: Surgery | Admitting: Surgery

## 2018-03-13 ENCOUNTER — Encounter (HOSPITAL_COMMUNITY): Payer: Self-pay

## 2018-03-13 ENCOUNTER — Other Ambulatory Visit: Payer: Self-pay

## 2018-03-13 DIAGNOSIS — I2510 Atherosclerotic heart disease of native coronary artery without angina pectoris: Secondary | ICD-10-CM | POA: Insufficient documentation

## 2018-03-13 DIAGNOSIS — Z79899 Other long term (current) drug therapy: Secondary | ICD-10-CM | POA: Insufficient documentation

## 2018-03-13 DIAGNOSIS — I10 Essential (primary) hypertension: Secondary | ICD-10-CM | POA: Diagnosis not present

## 2018-03-13 DIAGNOSIS — K219 Gastro-esophageal reflux disease without esophagitis: Secondary | ICD-10-CM | POA: Diagnosis not present

## 2018-03-13 DIAGNOSIS — M8580 Other specified disorders of bone density and structure, unspecified site: Secondary | ICD-10-CM | POA: Insufficient documentation

## 2018-03-13 DIAGNOSIS — Z7951 Long term (current) use of inhaled steroids: Secondary | ICD-10-CM | POA: Diagnosis not present

## 2018-03-13 DIAGNOSIS — I714 Abdominal aortic aneurysm, without rupture: Secondary | ICD-10-CM | POA: Diagnosis not present

## 2018-03-13 DIAGNOSIS — Z01818 Encounter for other preprocedural examination: Secondary | ICD-10-CM | POA: Insufficient documentation

## 2018-03-13 DIAGNOSIS — Z7982 Long term (current) use of aspirin: Secondary | ICD-10-CM | POA: Insufficient documentation

## 2018-03-13 LAB — CBC
HEMATOCRIT: 39.8 % (ref 39.0–52.0)
Hemoglobin: 13.3 g/dL (ref 13.0–17.0)
MCH: 33.2 pg (ref 26.0–34.0)
MCHC: 33.4 g/dL (ref 30.0–36.0)
MCV: 99.3 fL (ref 78.0–100.0)
Platelets: 183 10*3/uL (ref 150–400)
RBC: 4.01 MIL/uL — ABNORMAL LOW (ref 4.22–5.81)
RDW: 13.3 % (ref 11.5–15.5)
WBC: 7.2 10*3/uL (ref 4.0–10.5)

## 2018-03-13 LAB — COMPREHENSIVE METABOLIC PANEL
ALT: 22 U/L (ref 0–44)
AST: 25 U/L (ref 15–41)
Albumin: 4.1 g/dL (ref 3.5–5.0)
Alkaline Phosphatase: 70 U/L (ref 38–126)
Anion gap: 12 (ref 5–15)
BILIRUBIN TOTAL: 0.9 mg/dL (ref 0.3–1.2)
BUN: 10 mg/dL (ref 8–23)
CO2: 23 mmol/L (ref 22–32)
Calcium: 9.5 mg/dL (ref 8.9–10.3)
Chloride: 101 mmol/L (ref 98–111)
Creatinine, Ser: 0.99 mg/dL (ref 0.61–1.24)
Glucose, Bld: 99 mg/dL (ref 70–99)
POTASSIUM: 4.4 mmol/L (ref 3.5–5.1)
Sodium: 136 mmol/L (ref 135–145)
TOTAL PROTEIN: 7.4 g/dL (ref 6.5–8.1)

## 2018-03-13 LAB — TYPE AND SCREEN
ABO/RH(D): O NEG
ANTIBODY SCREEN: NEGATIVE

## 2018-03-13 LAB — APTT: aPTT: 27 seconds (ref 24–36)

## 2018-03-13 LAB — URINALYSIS, ROUTINE W REFLEX MICROSCOPIC
BILIRUBIN URINE: NEGATIVE
GLUCOSE, UA: NEGATIVE mg/dL
Hgb urine dipstick: NEGATIVE
Ketones, ur: 5 mg/dL — AB
Leukocytes, UA: NEGATIVE
NITRITE: NEGATIVE
PH: 5 (ref 5.0–8.0)
Protein, ur: NEGATIVE mg/dL
Specific Gravity, Urine: 1.024 (ref 1.005–1.030)

## 2018-03-13 LAB — SURGICAL PCR SCREEN
MRSA, PCR: NEGATIVE
Staphylococcus aureus: NEGATIVE

## 2018-03-13 LAB — ABO/RH: ABO/RH(D): O NEG

## 2018-03-13 LAB — PROTIME-INR
INR: 1.04
PROTHROMBIN TIME: 13.5 s (ref 11.4–15.2)

## 2018-03-13 NOTE — Progress Notes (Signed)
PCP - Dimas Chyle, pt has wellness appt scheduled for 03/14/18 Cardiologist - Bridgette Charlesetta Shanks  EKG - 02/02/18 Stress Test - denies ECHO - denies Cardiac Cath - denies  Aspirin Instructions: pt reported that he stopped his ASA yesterday.   Anesthesia review: hx coronary calcifications, sent to anesthesia for review. Pt does have cardiac clearance and denies any cardiac symptoms.   Patient denies shortness of breath, fever, cough and chest pain at PAT appointment   Patient verbalized understanding of instructions that were given to them at the PAT appointment. Patient was also instructed that they will need to review over the PAT instructions again at home before surgery.

## 2018-03-13 NOTE — Progress Notes (Signed)
Anesthesia Chart Review:  Case:  [id number] Date/Time:  03/21/18 0815   Procedure:  ABDOMINAL AORTIC ENDOVASCULAR FENESTRATED STENT GRAFT (N/A )   Anesthesia type:  General   Pre-op diagnosis:  ABDOMINAL AORTIC ANEURYSM   Location:  [facility] OR ROOM 16 / [place] OR   Surgeon:  [name], MD     DISCUSSION: 77yo male former smoker. 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   Cardiac clearance per telephone encounter 02/14/2018 by Rosaria Ferries, PA-C "He was recently seen by Dr. Harrell Gave who evaluated him. Jeffrey [name] would be at acceptable risk for the planned procedure without further cardiovascular testing. "  Also per Dr. Judeth Cornfield note 02/02/2018 "His RCRI risk is 1 pt, only for high risk vascular surgery. He has no patient specific cardiovascular risk factors. According to ACC/AHA guidelines, no further cardiovascular testing needed.  The patient may proceed to surgery at acceptable risk."  Anticipate he can proceed as planned barring acute status change.  VS: BP (!) 146/88   Pulse (!) 59   Temp 36.6 C   Resp 20   Ht 5\' 10"  (1.778 m)   Wt 95.3 kg   SpO2 97%   BMI 30.13 kg/m   PROVIDERS: Vivi Barrack, MD is PCP  Buford Dresser, MD is Cardiologist  LABS: Labs reviewed: Acceptable for surgery. (all labs ordered are listed, but only abnormal results are displayed)  Labs Reviewed  CBC - Abnormal; Notable for the following components:      Result Value   RBC 4.01 (*)    All other components within normal limits  URINALYSIS, ROUTINE W REFLEX MICROSCOPIC - Abnormal; Notable for the following components:   Ketones, ur 5 (*)    All other components within normal limits  SURGICAL PCR SCREEN  APTT  COMPREHENSIVE METABOLIC PANEL  PROTIME-INR  TYPE AND SCREEN  ABO/RH     IMAGES: CT Abd/pelvis  12/26/2017: IMPRESSION: VASCULAR  1. Enlarging bilobed fusiform infrarenal abdominal aortic aneurysm now with a maximal diameter of 5.5 cm compared to 4.9 cm in November of 2017. 2. The origin of the left renal artery is slightly lower than the right. 3. Highly tortuous iliac arteries. 4. Variant hepatic arterial anatomy noted incidentally.  NON-VASCULAR  1. No acute abnormality within the abdomen or pelvis. 2. Colonic diverticular disease without CT evidence of active inflammation. 3. Additional ancillary findings as above without significant interval change.  EKG: 02/02/2018: Sinus brady 57bpm  CV: Carotid US 01/29/2018: Final Interpretation: Right Carotid: Velocities in the right ICA are consistent with a 1-39% stenosis.  Left Carotid: Velocities in the left ICA are consistent with a 1-39% stenosis.  Vertebrals: Bilateral vertebral arteries demonstrate antegrade flow. Subclavians: Normal flow hemodynamics were seen in bilateral subclavian       arteries.  Past Medical History:  Diagnosis Date  . AAA (abdominal aortic aneurysm) ([place])   . Allergy   . Arthritis   . Colon polyps   . Coronary artery calcification   . Eczematous dermatitis   . GERD (gastroesophageal reflux disease)   . Hypertension   . Iliac artery stenosis, left ([place])   . Osteopenia     Past Surgical History:  Procedure Laterality Date  . Basal Skin Cancer  2012  . CATARACT EXTRACTION Bilateral   . TONSILLECTOMY    . TRANSURETHRAL RESECTION OF PROSTATE      MEDICATIONS: .  acetaminophen (TYLENOL) 500 MG tablet  . alendronate (FOSAMAX) 70 MG tablet  . Ascorbic Acid (VITAMIN C) 1000 MG tablet  . aspirin EC 81 MG tablet  . atorvastatin (LIPITOR) 40 MG tablet  . Calcium Carbonate-Vitamin D (CALCIUM 600+D) 600-200 MG-UNIT TABS  . Cyanocobalamin (RA VITAMIN B-12 TR) 1000 MCG TBCR  . Emollient (CERAVE) LOTN  . ferrous sulfate 325 (65 FE) MG tablet  . fexofenadine (ALLEGRA) 180 MG tablet   . fluticasone (FLONASE) 50 MCG/ACT nasal spray  . GLUCOSAMINE-CHONDROITIN PO  . ibuprofen (ADVIL,MOTRIN) 200 MG tablet  . losartan (COZAAR) 25 MG tablet  . Multiple Vitamins-Minerals (CENTRUM ADULTS PO)  . Omega-3 1000 MG CAPS   No current facility-administered medications for this encounter.     Wynonia Musty [facility] Short Stay Center/Anesthesiology Phone [phone] 03/13/2018 4:21 PM

## 2018-03-14 ENCOUNTER — Encounter: Payer: Self-pay | Admitting: Family Medicine

## 2018-03-14 ENCOUNTER — Ambulatory Visit (INDEPENDENT_AMBULATORY_CARE_PROVIDER_SITE_OTHER): Payer: Medicare Other | Admitting: Family Medicine

## 2018-03-14 VITALS — BP 128/74 | HR 60 | Temp 97.8°F | Ht 70.0 in | Wt 207.4 lb

## 2018-03-14 DIAGNOSIS — I10 Essential (primary) hypertension: Secondary | ICD-10-CM

## 2018-03-14 DIAGNOSIS — I714 Abdominal aortic aneurysm, without rupture, unspecified: Secondary | ICD-10-CM

## 2018-03-14 DIAGNOSIS — E785 Hyperlipidemia, unspecified: Secondary | ICD-10-CM

## 2018-03-14 DIAGNOSIS — Z0001 Encounter for general adult medical examination with abnormal findings: Secondary | ICD-10-CM

## 2018-03-14 LAB — CBC
HEMATOCRIT: 38.6 % — AB (ref 39.0–52.0)
HEMOGLOBIN: 13.6 g/dL (ref 13.0–17.0)
MCHC: 35.1 g/dL (ref 30.0–36.0)
MCV: 96.1 fl (ref 78.0–100.0)
Platelets: 198 10*3/uL (ref 150.0–400.0)
RBC: 4.02 Mil/uL — ABNORMAL LOW (ref 4.22–5.81)
RDW: 14 % (ref 11.5–15.5)
WBC: 5.8 10*3/uL (ref 4.0–10.5)

## 2018-03-14 LAB — LIPID PANEL
CHOL/HDL RATIO: 2
Cholesterol: 117 mg/dL (ref 0–200)
HDL: 57 mg/dL (ref >39.00)
LDL Cholesterol: 46 mg/dL (ref 0–99)
NONHDL: 59.85
TRIGLYCERIDES: 71 mg/dL (ref 0.0–149.0)
VLDL: 14.2 mg/dL (ref 0.0–40.0)

## 2018-03-14 LAB — COMPREHENSIVE METABOLIC PANEL
ALT: 19 U/L (ref 0–53)
AST: 21 U/L (ref 0–37)
Albumin: 4.6 g/dL (ref 3.5–5.2)
Alkaline Phosphatase: 65 U/L (ref 39–117)
BILIRUBIN TOTAL: 0.9 mg/dL (ref 0.2–1.2)
BUN: 8 mg/dL (ref 6–23)
CALCIUM: 9.7 mg/dL (ref 8.4–10.5)
CO2: 26 meq/L (ref 19–32)
CREATININE: 0.78 mg/dL (ref 0.40–1.50)
Chloride: 103 mEq/L (ref 96–112)
GFR: 102.56 mL/min (ref >60.00)
GLUCOSE: 92 mg/dL (ref 70–99)
Potassium: 4.8 mEq/L (ref 3.5–5.1)
Sodium: 136 mEq/L (ref 135–145)
TOTAL PROTEIN: 7.8 g/dL (ref 6.0–8.3)

## 2018-03-14 NOTE — Patient Instructions (Addendum)
It was very nice to see you today!  Keep up the good work!  We will check blood work today.  Come back to see me in 1 year for your next physical, or sooner as needed.   Take care, Dr [name]   Preventive Care 77 Years and Older, Male Preventive care refers to lifestyle choices and visits with your health care provider that can promote health and wellness. What does preventive care include?  A yearly physical exam. This is also called an annual well check.  Dental exams once or twice a year.  Routine eye exams. Ask your health care provider how often you should have your eyes checked.  Personal lifestyle choices, including: ? Daily care of your teeth and gums. ? Regular physical activity. ? Eating a healthy diet. ? Avoiding tobacco and drug use. ? Limiting alcohol use. ? Practicing safe sex. ? Taking low doses of aspirin every day. ? Taking vitamin and mineral supplements as recommended by your health care provider. What happens during an annual well check? The services and screenings done by your health care provider during your annual well check will depend on your age, overall health, lifestyle risk factors, and family history of disease. Counseling Your health care provider may ask you questions about your:  Alcohol use.  Tobacco use.  Drug use.  Emotional well-being.  Home and relationship well-being.  Sexual activity.  Eating habits.  History of falls.  Memory and ability to understand (cognition).  Work and work Statistician.  Screening You may have the following tests or measurements:  Height, weight, and BMI.  Blood pressure.  Lipid and cholesterol levels. These may be checked every 5 years, or more frequently if you are over 42 years old.  Skin check.  Lung cancer screening. You may have this screening every year starting at age 28 if you have a 30-pack-year history of smoking and currently smoke or have quit within the past 15  years.  Fecal occult blood test (FOBT) of the stool. You may have this test every year starting at age 14.  Flexible sigmoidoscopy or colonoscopy. You may have a sigmoidoscopy every 5 years or a colonoscopy every 10 years starting at age 72.  Prostate cancer screening. Recommendations will vary depending on your family history and other risks.  Hepatitis C blood test.  Hepatitis B blood test.  Sexually transmitted disease (STD) testing.  Diabetes screening. This is done by checking your blood sugar (glucose) after you have not eaten for a while (fasting). You may have this done every 1-3 years.  Abdominal aortic aneurysm (AAA) screening. You may need this if you are a current or former smoker.  Osteoporosis. You may be screened starting at age 36 if you are at high risk.  Talk with your health care provider about your test results, treatment options, and if necessary, the need for more tests. Vaccines Your health care provider may recommend certain vaccines, such as:  Influenza vaccine. This is recommended every year.  Tetanus, diphtheria, and acellular pertussis (Tdap, Td) vaccine. You may need a Td booster every 10 years.  Varicella vaccine. You may need this if you have not been vaccinated.  Zoster vaccine. You may need this after age 9.  Measles, mumps, and rubella (MMR) vaccine. You may need at least one dose of MMR if you were born in 1957 or later. You may also need a second dose.  Pneumococcal 13-valent conjugate (PCV13) vaccine. One dose is recommended after age 60.  Pneumococcal polysaccharide (PPSV23) vaccine. One dose is recommended after age 41.  Meningococcal vaccine. You may need this if you have certain conditions.  Hepatitis A vaccine. You may need this if you have certain conditions or if you travel or work in places where you may be exposed to hepatitis A.  Hepatitis B vaccine. You may need this if you have certain conditions or if you travel or work in  places where you may be exposed to hepatitis B.  Haemophilus influenzae type b (Hib) vaccine. You may need this if you have certain risk factors.  Talk to your health care provider about which screenings and vaccines you need and how often you need them. This information is not intended to replace advice given to you by your health care provider. Make sure you discuss any questions you have with your health care provider. Document Released: 07/03/2015 Document Revised: 02/24/2016 Document Reviewed: 04/07/2015 Elsevier Interactive Patient Education  [employer].

## 2018-03-14 NOTE — Assessment & Plan Note (Signed)
Continue Lipitor 40 mg daily Check lipid panel today  

## 2018-03-14 NOTE — Progress Notes (Signed)
Subjective:  Jeffrey [name] is a 77 y.o. male who presents today for his annual comprehensive physical exam.    HPI:  He has no acute complaints today.   Lifestyle Diet: No specific diet. Exercise: Tries to walk regularly.  Depression screen PHQ 2/9 02/28/2018  Decreased Interest 0  Down, Depressed, Hopeless 0  PHQ - 2 Score 0    Health Maintenance Due  Topic Date Due  . TETANUS/TDAP  02/08/1960     ROS: Positive for high blood pressure and frequent urination, dry eyes, otherwise a complete review of systems was negative.   PMH:  The following were reviewed and entered/updated in epic: Past Medical History:  Diagnosis Date  . AAA (abdominal aortic aneurysm) ([place])   . Allergy   . Arthritis   . Colon polyps   . Coronary artery calcification   . Eczematous dermatitis   . GERD (gastroesophageal reflux disease)   . Hypertension   . Iliac artery stenosis, left ([place])   . Osteopenia    Patient Active Problem List   Diagnosis Date Noted  . Osteopenia 02/24/2017  . Abdominal aortic aneurysm (AAA) without rupture ([place]) 07/06/2015  . Basal cell carcinoma 07/06/2015  . Essential (primary) hypertension 07/06/2015  . History of colon polyps 07/06/2015  . HLD (hyperlipidemia) 07/06/2015   Past Surgical History:  Procedure Laterality Date  . Basal Skin Cancer  2012  . CATARACT EXTRACTION Bilateral   . TONSILLECTOMY    . TRANSURETHRAL RESECTION OF PROSTATE      Family History  Problem Relation Age of Onset  . Diabetes Mother   . Hypertension Mother   . Stroke Mother   . Alcohol abuse Father   . Heart attack Father   . Heart disease Father   . Hypertension Father   . Cancer Sister   . Hyperlipidemia Sister   . Pancreatic cancer Sister   . Pancreatic cancer Brother   . Hypertension Son     Medications- reviewed and updated Current Outpatient Medications  Medication Sig Dispense Refill  . acetaminophen (TYLENOL) 500 MG tablet Take 1,000 mg by mouth every 6  (six) hours as needed for moderate pain or headache.    . alendronate (FOSAMAX) 70 MG tablet TAKE 1 TABLET BY MOUTH EVERY WEEK (Patient taking differently: Take 70 mg by mouth every Sunday. ) 12 tablet 0  . Ascorbic Acid (VITAMIN C) 1000 MG tablet Take 1,000 mg by mouth daily.     Marland Kitchen aspirin EC 81 MG tablet Take 81 mg by mouth daily.     Marland Kitchen atorvastatin (LIPITOR) 40 MG tablet Take 1 tablet (40 mg total) by mouth daily at 6 PM. 90 tablet 3  . Calcium Carbonate-Vitamin D (CALCIUM 600+D) 600-200 MG-UNIT TABS Take 1 tablet by mouth every other day.     . Cyanocobalamin (RA VITAMIN B-12 TR) 1000 MCG TBCR Take 1,000 mcg by mouth daily.     . Emollient (CERAVE) LOTN Apply 1 application topically daily as needed (after shower care).     . ferrous sulfate 325 (65 FE) MG tablet Take 325 mg by mouth every Wednesday.    . fexofenadine (ALLEGRA) 180 MG tablet Take 180 mg by mouth daily as needed for allergies.     . fluticasone (FLONASE) 50 MCG/ACT nasal spray Place 1 spray into both nostrils every other day.    Marland Kitchen GLUCOSAMINE-CHONDROITIN PO Take 1 tablet by mouth 2 (two) times daily.     Marland Kitchen ibuprofen (ADVIL,MOTRIN) 200 MG tablet Take 200 mg  by mouth every 6 (six) hours as needed for headache or moderate pain.    Marland Kitchen losartan (COZAAR) 25 MG tablet TAKE 1 TABLET BY MOUTH TWICE DAILY (Patient taking differently: Take 25 mg by mouth 2 (two) times daily. ) 180 tablet 0  . Multiple Vitamins-Minerals (CENTRUM ADULTS PO) Take 1 tablet by mouth daily.     . Omega-3 1000 MG CAPS Take 1,000-2,000 mg by mouth See admin instructions. Take 1000 mg by mouth in the morning and take 2000 mg by mouth in the evening after supper     No current facility-administered medications for this visit.     Allergies-reviewed and updated Allergies  Allergen Reactions  . Ketotifen Fumarate Swelling and Other (See Comments)    Eye swelling  . Triamcinolone Hives and Rash    Social History   Socioeconomic History  . Marital status:  Divorced    Spouse name: Not on file  . Number of children: 1  . Years of education: Not on file  . Highest education level: Not on file  Occupational History  . Occupation: Retired    Fish farm manager: [employer]  . Financial resource strain: Not on file  . Food insecurity:    Worry: Not on file    Inability: Not on file  . Transportation needs:    Medical: Not on file    Non-medical: Not on file  Tobacco Use  . Smoking status: Former Smoker    Packs/day: 1.00    Years: 32.00    Pack years: 32.00    Types: Cigarettes    Last attempt to quit: 08/17/1990    Years since quitting: 27.5  . Smokeless tobacco: Never Used  Substance and Sexual Activity  . Alcohol use: Yes    Comment: Occassionally, "when I feel like it"  . Drug use: No  . Sexual activity: Yes    Partners: Female  Lifestyle  . Physical activity:    Days per week: Not on file    Minutes per session: Not on file  . Stress: Not on file  Relationships  . Social connections:    Talks on phone: Not on file    Gets together: Not on file    Attends religious service: Not on file    Active member of club or organization: Not on file    Attends meetings of clubs or organizations: Not on file    Relationship status: Not on file  Other Topics Concern  . Not on file  Social History Narrative  . Not on file    Objective:  Physical Exam: BP 128/74 (BP Location: Left Arm, Patient Position: Sitting, Cuff Size: Normal)   Pulse 60   Temp 97.8 F (36.6 C) (Oral)   Ht 5\' 10"  (1.778 m)   Wt 207 lb 6.4 oz (94.1 kg)   SpO2 97%   BMI 29.76 kg/m   Body mass index is 29.76 kg/m. Wt Readings from Last 3 Encounters:  03/14/18 207 lb 6.4 oz (94.1 kg)  03/13/18 210 lb (95.3 kg)  02/28/18 210 lb 9.6 oz (95.5 kg)   Gen: NAD, resting comfortably HEENT: TMs normal bilaterally. OP clear. No thyromegaly noted.  CV: RRR with no murmurs appreciated Pulm: NWOB, CTAB with no crackles, wheezes, or rhonchi GI: Normal  bowel sounds present. Soft, Nontender, Nondistended. MSK: no edema, cyanosis, or clubbing noted Skin: warm, dry Neuro: CN2-12 grossly intact. Strength 5/5 in upper and lower extremities. Reflexes symmetric and intact bilaterally.  Psych:  Normal affect and thought content  Assessment/Plan:  HLD (hyperlipidemia) Continue Lipitor 40 mg daily.  Check lipid panel today.  Essential (primary) hypertension At goal.  Home readings in the 120s to 130s over 70s to 80s.  Continue losartan 25 mg twice daily.  Check BMET today.  Abdominal aortic aneurysm (AAA) without rupture ([facility]) Symptoms are stable.  Will be undergoing endovascular repair next week.  Preventative Healthcare: Flu shot declined given upcoming surgery.  Check lipid panel today.  Patient Counseling(The following topics were reviewed and/or handout was given):  -Nutrition: Stressed importance of moderation in sodium/caffeine intake, saturated fat and cholesterol, caloric balance, sufficient intake of fresh fruits, vegetables, and fiber.  -Stressed the importance of regular exercise.   -Substance Abuse: Discussed cessation/primary prevention of tobacco, alcohol, or other drug use; driving or other dangerous activities under the influence; availability of treatment for abuse.   -Injury prevention: Discussed safety belts, safety helmets, smoke detector, smoking near bedding or upholstery.   -Sexuality: Discussed sexually transmitted diseases, partner selection, use of condoms, avoidance of unintended pregnancy and contraceptive alternatives.   -Dental health: Discussed importance of regular tooth brushing, flossing, and dental visits.  -Health maintenance and immunizations reviewed. Please refer to Health maintenance section.  Return to care in 1 year for next preventative visit.   Algis Greenhouse. [name], MD 03/14/2018 12:21 PM

## 2018-03-14 NOTE — Assessment & Plan Note (Signed)
Symptoms are stable.  Will be undergoing endovascular repair next week.

## 2018-03-14 NOTE — Assessment & Plan Note (Signed)
At goal.  Home readings in the 120s to 130s over 70s to 80s.  Continue losartan 25 mg twice daily.  Check BMET today.

## 2018-03-16 NOTE — Progress Notes (Signed)
Dr Marigene Ehlers interpretation of your lab work:  All of your blood work is NORMAL. We do not need to make any changes to your treatment plan at this time. We can recheck again next year.    If you have any additional questions, please give [place] a call or send [place] a message through [facility].  Take care, Dr [name]

## 2018-03-20 NOTE — Anesthesia Preprocedure Evaluation (Addendum)
Anesthesia Evaluation  Patient identified by MRN, date of birth, ID band Patient awake    Reviewed: Allergy & Precautions, NPO status , Patient's Chart, lab work & pertinent test results  History of Anesthesia Complications Negative for: history of anesthetic complications  Airway Mallampati: II  TM Distance: >3 FB Neck ROM: Full    Dental no notable dental hx.    Pulmonary neg pulmonary ROS, former smoker,    Pulmonary exam normal        Cardiovascular hypertension, + CAD and + Peripheral Vascular Disease  Normal cardiovascular exam  AAA   Neuro/Psych negative neurological ROS  negative psych ROS   GI/Hepatic Neg liver ROS, GERD  ,  Endo/Other  negative endocrine ROS  Renal/GU negative Renal ROS  negative genitourinary   Musculoskeletal negative musculoskeletal ROS (+)   Abdominal   Peds  Hematology negative hematology ROS (+)   Anesthesia Other Findings   Reproductive/Obstetrics                           Anesthesia Physical Anesthesia Plan  ASA: III  Anesthesia Plan: General   Post-op Pain Management:    Induction: Intravenous  PONV Risk Score and Plan: 2 and Ondansetron, Dexamethasone and Treatment may vary due to age or medical condition  Airway Management Planned: Oral ETT  Additional Equipment: Arterial line  Intra-op Plan:   Post-operative Plan: Extubation in OR  Informed Consent: I have reviewed the patients History and Physical, chart, labs and discussed the procedure including the risks, benefits and alternatives for the proposed anesthesia with the patient or authorized representative who has indicated his/her understanding and acceptance.     Plan Discussed with:   Anesthesia Plan Comments:        Anesthesia Quick Evaluation

## 2018-03-21 ENCOUNTER — Encounter (HOSPITAL_COMMUNITY): Payer: Self-pay

## 2018-03-21 ENCOUNTER — Inpatient Hospital Stay (HOSPITAL_COMMUNITY)
Admission: RE | Admit: 2018-03-21 | Discharge: 2018-03-22 | DRG: 269 | Disposition: A | Discharge status: Home / Self Care | Payer: Medicare Other | Attending: Surgery | Admitting: Surgery

## 2018-03-21 ENCOUNTER — Inpatient Hospital Stay (HOSPITAL_COMMUNITY): Payer: Medicare Other | Admitting: Certified Registered Nurse Anesthetist

## 2018-03-21 ENCOUNTER — Inpatient Hospital Stay (HOSPITAL_COMMUNITY): Payer: Medicare Other | Admitting: Physician Assistant

## 2018-03-21 ENCOUNTER — Encounter (HOSPITAL_COMMUNITY)
Admission: RE | Disposition: A | Discharge status: Home / Self Care | Payer: Self-pay | Source: Ambulatory Visit | Attending: Surgery

## 2018-03-21 ENCOUNTER — Inpatient Hospital Stay (HOSPITAL_COMMUNITY): Payer: Medicare Other

## 2018-03-21 DIAGNOSIS — Z823 Family history of stroke: Secondary | ICD-10-CM | POA: Diagnosis not present

## 2018-03-21 DIAGNOSIS — J984 Other disorders of lung: Secondary | ICD-10-CM | POA: Diagnosis not present

## 2018-03-21 DIAGNOSIS — Z9889 Other specified postprocedural states: Secondary | ICD-10-CM

## 2018-03-21 DIAGNOSIS — Z791 Long term (current) use of non-steroidal anti-inflammatories (NSAID): Secondary | ICD-10-CM | POA: Diagnosis not present

## 2018-03-21 DIAGNOSIS — I2510 Atherosclerotic heart disease of native coronary artery without angina pectoris: Secondary | ICD-10-CM | POA: Diagnosis not present

## 2018-03-21 DIAGNOSIS — I10 Essential (primary) hypertension: Secondary | ICD-10-CM | POA: Diagnosis not present

## 2018-03-21 DIAGNOSIS — Z8249 Family history of ischemic heart disease and other diseases of the circulatory system: Secondary | ICD-10-CM | POA: Diagnosis not present

## 2018-03-21 DIAGNOSIS — Z87891 Personal history of nicotine dependence: Secondary | ICD-10-CM

## 2018-03-21 DIAGNOSIS — I714 Abdominal aortic aneurysm, without rupture, unspecified: Secondary | ICD-10-CM | POA: Diagnosis present

## 2018-03-21 DIAGNOSIS — Z7982 Long term (current) use of aspirin: Secondary | ICD-10-CM | POA: Diagnosis not present

## 2018-03-21 DIAGNOSIS — K219 Gastro-esophageal reflux disease without esophagitis: Secondary | ICD-10-CM | POA: Diagnosis present

## 2018-03-21 DIAGNOSIS — Z833 Family history of diabetes mellitus: Secondary | ICD-10-CM

## 2018-03-21 DIAGNOSIS — Z79899 Other long term (current) drug therapy: Secondary | ICD-10-CM

## 2018-03-21 DIAGNOSIS — Z8679 Personal history of other diseases of the circulatory system: Secondary | ICD-10-CM

## 2018-03-21 DIAGNOSIS — E7800 Pure hypercholesterolemia, unspecified: Secondary | ICD-10-CM | POA: Diagnosis not present

## 2018-03-21 DIAGNOSIS — E785 Hyperlipidemia, unspecified: Secondary | ICD-10-CM | POA: Diagnosis not present

## 2018-03-21 DIAGNOSIS — Z95828 Presence of other vascular implants and grafts: Secondary | ICD-10-CM | POA: Diagnosis not present

## 2018-03-21 HISTORY — PX: ABDOMINAL AORTIC ENDOVASCULAR FENESTRATED STENT GRAFT: SHX6430

## 2018-03-21 LAB — POCT ACTIVATED CLOTTING TIME
ACTIVATED CLOTTING TIME: 197 s
ACTIVATED CLOTTING TIME: 213 s
ACTIVATED CLOTTING TIME: 186 s
Activated Clotting Time: 241 seconds
Activated Clotting Time: 197 seconds

## 2018-03-21 LAB — CREATININE, SERUM
CREATININE: 1.04 mg/dL (ref 0.61–1.24)
GFR calc Af Amer: >60 mL/min (ref >60)

## 2018-03-21 LAB — CBC
HCT: 34.2 % — ABNORMAL LOW (ref 39.0–52.0)
HEMOGLOBIN: 11.4 g/dL — AB (ref 13.0–17.0)
MCH: 33.2 pg (ref 26.0–34.0)
MCHC: 33.3 g/dL (ref 30.0–36.0)
MCV: 99.7 fL (ref 78.0–100.0)
PLATELETS: 125 10*3/uL — AB (ref 150–400)
RBC: 3.43 MIL/uL — ABNORMAL LOW (ref 4.22–5.81)
RDW: 13.6 % (ref 11.5–15.5)
WBC: 10.4 10*3/uL (ref 4.0–10.5)

## 2018-03-21 SURGERY — ABDOMINAL AORTIC ENDOVASCULAR FENESTRATED STENT GRAFT
Anesthesia: General

## 2018-03-21 MED ORDER — SODIUM CHLORIDE 0.9 % IV SOLN: INTRAVENOUS | Status: DC

## 2018-03-21 MED ORDER — POTASSIUM CHLORIDE CRYS ER 20 MEQ PO TBCR: 20.0000 meq | EXTENDED_RELEASE_TABLET | Freq: Every day | ORAL | Status: DC | PRN

## 2018-03-21 MED ORDER — LACTATED RINGERS IV SOLN
INTRAVENOUS | Status: DC | PRN
  Administered 2018-03-21: 08:00:00 via INTRAVENOUS

## 2018-03-21 MED ORDER — DEXAMETHASONE SODIUM PHOSPHATE 10 MG/ML IJ SOLN
INTRAMUSCULAR | Status: DC | PRN
  Administered 2018-03-21: 10 mg via INTRAVENOUS

## 2018-03-21 MED ORDER — ESMOLOL HCL 100 MG/10ML IV SOLN
INTRAVENOUS | Status: AC
  Filled 2018-03-21: qty 10

## 2018-03-21 MED ORDER — LABETALOL HCL 5 MG/ML IV SOLN
INTRAVENOUS | Status: DC | PRN
  Administered 2018-03-21 (×2): 10 mg via INTRAVENOUS

## 2018-03-21 MED ORDER — ACETAMINOPHEN 325 MG PO TABS
325.0000 mg | ORAL_TABLET | ORAL | Status: DC | PRN
  Administered 2018-03-22 (×2): 650 mg via ORAL
  Filled 2018-03-21 (×2): qty 2

## 2018-03-21 MED ORDER — CHLORHEXIDINE GLUCONATE 4 % EX LIQD: 60.0000 mL | Freq: Once | CUTANEOUS | Status: DC

## 2018-03-21 MED ORDER — IODIXANOL 320 MG/ML IV SOLN
INTRAVENOUS | Status: DC | PRN
  Administered 2018-03-21: 150 mL via INTRA_ARTERIAL

## 2018-03-21 MED ORDER — CEFAZOLIN SODIUM-DEXTROSE 2-4 GM/100ML-% IV SOLN
2.0000 g | Freq: Three times a day (TID) | INTRAVENOUS | Status: AC
  Administered 2018-03-21 – 2018-03-22 (×2): 2 g via INTRAVENOUS
  Filled 2018-03-21 (×2): qty 100

## 2018-03-21 MED ORDER — PROPOFOL 10 MG/ML IV BOLUS
INTRAVENOUS | Status: DC | PRN
  Administered 2018-03-21: 90 mg via INTRAVENOUS

## 2018-03-21 MED ORDER — ONDANSETRON HCL 4 MG/2ML IJ SOLN
INTRAMUSCULAR | Status: DC | PRN
  Administered 2018-03-21: 4 mg via INTRAVENOUS

## 2018-03-21 MED ORDER — SODIUM CHLORIDE 0.9 % IV SOLN
INTRAVENOUS | Status: DC | PRN
  Administered 2018-03-21: 1500 mL

## 2018-03-21 MED ORDER — SODIUM CHLORIDE 0.9 % IV SOLN
INTRAVENOUS | Status: AC
  Filled 2018-03-21: qty 1.2

## 2018-03-21 MED ORDER — METOPROLOL TARTRATE 5 MG/5ML IV SOLN: 2.0000 mg | INTRAVENOUS | Status: DC | PRN

## 2018-03-21 MED ORDER — LIDOCAINE 2% (20 MG/ML) 5 ML SYRINGE
INTRAMUSCULAR | Status: DC | PRN
  Administered 2018-03-21: 100 mg via INTRAVENOUS

## 2018-03-21 MED ORDER — ALUM & MAG HYDROXIDE-SIMETH 200-200-20 MG/5ML PO SUSP: 15.0000 mL | ORAL | Status: DC | PRN

## 2018-03-21 MED ORDER — LABETALOL HCL 5 MG/ML IV SOLN
INTRAVENOUS | Status: AC
  Filled 2018-03-21: qty 4

## 2018-03-21 MED ORDER — PROTAMINE SULFATE 10 MG/ML IV SOLN
INTRAVENOUS | Status: DC | PRN
  Administered 2018-03-21: 40 mg via INTRAVENOUS
  Administered 2018-03-21: 10 mg via INTRAVENOUS

## 2018-03-21 MED ORDER — EPHEDRINE 5 MG/ML INJ
INTRAVENOUS | Status: AC
  Filled 2018-03-21: qty 10

## 2018-03-21 MED ORDER — FENTANYL CITRATE (PF) 100 MCG/2ML IJ SOLN
INTRAMUSCULAR | Status: DC | PRN
  Administered 2018-03-21: 50 ug via INTRAVENOUS
  Administered 2018-03-21: 100 ug via INTRAVENOUS
  Administered 2018-03-21 (×3): 50 ug via INTRAVENOUS

## 2018-03-21 MED ORDER — ROCURONIUM BROMIDE 50 MG/5ML IV SOSY
PREFILLED_SYRINGE | INTRAVENOUS | Status: AC
  Filled 2018-03-21: qty 5

## 2018-03-21 MED ORDER — FENTANYL CITRATE (PF) 250 MCG/5ML IJ SOLN
INTRAMUSCULAR | Status: AC
  Filled 2018-03-21: qty 5

## 2018-03-21 MED ORDER — HEPARIN SODIUM (PORCINE) 5000 UNIT/ML IJ SOLN
5000.0000 [IU] | Freq: Three times a day (TID) | INTRAMUSCULAR | Status: DC
  Administered 2018-03-21 – 2018-03-22 (×2): 5000 [IU] via SUBCUTANEOUS
  Filled 2018-03-21 (×2): qty 1

## 2018-03-21 MED ORDER — SODIUM CHLORIDE 0.9 % IV SOLN
INTRAVENOUS | Status: DC | PRN
  Administered 2018-03-21: 25 ug/min via INTRAVENOUS

## 2018-03-21 MED ORDER — SODIUM CHLORIDE 0.9 % IV SOLN
INTRAVENOUS | Status: AC
  Filled 2018-03-21 (×2): qty 1.2

## 2018-03-21 MED ORDER — OXYCODONE HCL 5 MG PO TABS
5.0000 mg | ORAL_TABLET | Freq: Once | ORAL | Status: AC | PRN
  Administered 2018-03-21: 5 mg via ORAL

## 2018-03-21 MED ORDER — EPHEDRINE SULFATE-NACL 50-0.9 MG/10ML-% IV SOSY
PREFILLED_SYRINGE | INTRAVENOUS | Status: DC | PRN
  Administered 2018-03-21: 5 mg via INTRAVENOUS
  Administered 2018-03-21: 10 mg via INTRAVENOUS

## 2018-03-21 MED ORDER — SUGAMMADEX SODIUM 200 MG/2ML IV SOLN
INTRAVENOUS | Status: DC | PRN
  Administered 2018-03-21 (×2): 100 mg via INTRAVENOUS

## 2018-03-21 MED ORDER — OXYCODONE HCL 5 MG PO TABS
ORAL_TABLET | ORAL | Status: AC
  Filled 2018-03-21: qty 1

## 2018-03-21 MED ORDER — ESMOLOL HCL 100 MG/10ML IV SOLN
INTRAVENOUS | Status: DC | PRN
  Administered 2018-03-21: 30 mg via INTRAVENOUS

## 2018-03-21 MED ORDER — CEFAZOLIN SODIUM-DEXTROSE 2-4 GM/100ML-% IV SOLN
2.0000 g | INTRAVENOUS | Status: AC
  Administered 2018-03-21 (×2): 2 g via INTRAVENOUS

## 2018-03-21 MED ORDER — 0.9 % SODIUM CHLORIDE (POUR BTL) OPTIME
TOPICAL | Status: DC | PRN
  Administered 2018-03-21: 1000 mL

## 2018-03-21 MED ORDER — HEPARIN SODIUM (PORCINE) 1000 UNIT/ML IJ SOLN
INTRAMUSCULAR | Status: AC
  Filled 2018-03-21: qty 1

## 2018-03-21 MED ORDER — ASPIRIN EC 81 MG PO TBEC
81.0000 mg | DELAYED_RELEASE_TABLET | Freq: Every day | ORAL | Status: DC
Start: 2018-03-21 — End: 2018-03-22
  Administered 2018-03-21 – 2018-03-22 (×2): 81 mg via ORAL
  Filled 2018-03-21 (×2): qty 1

## 2018-03-21 MED ORDER — LOSARTAN POTASSIUM 25 MG PO TABS
25.0000 mg | ORAL_TABLET | Freq: Two times a day (BID) | ORAL | Status: DC
  Administered 2018-03-21 – 2018-03-22 (×2): 25 mg via ORAL
  Filled 2018-03-21 (×2): qty 1

## 2018-03-21 MED ORDER — SODIUM CHLORIDE 0.9 % IV SOLN
INTRAVENOUS | Status: DC
  Administered 2018-03-21: 17:00:00 via INTRAVENOUS

## 2018-03-21 MED ORDER — CEFAZOLIN SODIUM 1 G IJ SOLR
INTRAMUSCULAR | Status: AC
  Filled 2018-03-21: qty 20

## 2018-03-21 MED ORDER — LIDOCAINE 2% (20 MG/ML) 5 ML SYRINGE
INTRAMUSCULAR | Status: AC
  Filled 2018-03-21: qty 5

## 2018-03-21 MED ORDER — POLYETHYLENE GLYCOL 3350 17 G PO PACK: 17.0000 g | PACK | Freq: Every day | ORAL | Status: DC | PRN

## 2018-03-21 MED ORDER — CALCIUM CARBONATE-VITAMIN D 500-200 MG-UNIT PO TABS
1.0000 | ORAL_TABLET | ORAL | Status: DC
  Administered 2018-03-22: 1 via ORAL
  Filled 2018-03-21: qty 1

## 2018-03-21 MED ORDER — DOCUSATE SODIUM 100 MG PO CAPS
100.0000 mg | ORAL_CAPSULE | Freq: Every day | ORAL | Status: DC
  Administered 2018-03-22: 100 mg via ORAL
  Filled 2018-03-21: qty 1

## 2018-03-21 MED ORDER — PHENOL 1.4 % MT LIQD: 1.0000 | OROMUCOSAL | Status: DC | PRN

## 2018-03-21 MED ORDER — HYDRALAZINE HCL 20 MG/ML IJ SOLN: 5.0000 mg | INTRAMUSCULAR | Status: DC | PRN

## 2018-03-21 MED ORDER — OXYCODONE HCL 5 MG/5ML PO SOLN: 5.0000 mg | Freq: Once | ORAL | Status: AC | PRN

## 2018-03-21 MED ORDER — ONDANSETRON HCL 4 MG/2ML IJ SOLN: 4.0000 mg | Freq: Once | INTRAMUSCULAR | Status: DC | PRN

## 2018-03-21 MED ORDER — MAGNESIUM SULFATE 2 GM/50ML IV SOLN: 2.0000 g | Freq: Every day | INTRAVENOUS | Status: DC | PRN

## 2018-03-21 MED ORDER — HEPARIN SODIUM (PORCINE) 1000 UNIT/ML IJ SOLN
INTRAMUSCULAR | Status: DC | PRN
  Administered 2018-03-21: 9000 [IU] via INTRAVENOUS
  Administered 2018-03-21: 2000 [IU] via INTRAVENOUS
  Administered 2018-03-21: 3000 [IU] via INTRAVENOUS
  Administered 2018-03-21: 1000 [IU] via INTRAVENOUS

## 2018-03-21 MED ORDER — ONDANSETRON HCL 4 MG/2ML IJ SOLN
INTRAMUSCULAR | Status: AC
  Filled 2018-03-21: qty 2

## 2018-03-21 MED ORDER — ROCURONIUM BROMIDE 50 MG/5ML IV SOSY
PREFILLED_SYRINGE | INTRAVENOUS | Status: DC | PRN
  Administered 2018-03-21: 30 mg via INTRAVENOUS
  Administered 2018-03-21 (×3): 20 mg via INTRAVENOUS
  Administered 2018-03-21: 50 mg via INTRAVENOUS
  Administered 2018-03-21: 10 mg via INTRAVENOUS

## 2018-03-21 MED ORDER — FERROUS SULFATE 325 (65 FE) MG PO TABS
325.0000 mg | ORAL_TABLET | ORAL | Status: DC
  Administered 2018-03-21: 325 mg via ORAL
  Filled 2018-03-21: qty 1

## 2018-03-21 MED ORDER — OXYCODONE-ACETAMINOPHEN 5-325 MG PO TABS: 1.0000 | ORAL_TABLET | ORAL | Status: DC | PRN

## 2018-03-21 MED ORDER — ONDANSETRON HCL 4 MG/2ML IJ SOLN: 4.0000 mg | Freq: Four times a day (QID) | INTRAMUSCULAR | Status: DC | PRN

## 2018-03-21 MED ORDER — PHENYLEPHRINE 40 MCG/ML (10ML) SYRINGE FOR IV PUSH (FOR BLOOD PRESSURE SUPPORT)
PREFILLED_SYRINGE | INTRAVENOUS | Status: DC | PRN
  Administered 2018-03-21: 80 ug via INTRAVENOUS
  Administered 2018-03-21: 120 ug via INTRAVENOUS

## 2018-03-21 MED ORDER — SODIUM CHLORIDE 0.9 % IV SOLN: 500.0000 mL | Freq: Once | INTRAVENOUS | Status: DC | PRN

## 2018-03-21 MED ORDER — ACETAMINOPHEN 325 MG RE SUPP: 325.0000 mg | RECTAL | Status: DC | PRN

## 2018-03-21 MED ORDER — PHENYLEPHRINE 40 MCG/ML (10ML) SYRINGE FOR IV PUSH (FOR BLOOD PRESSURE SUPPORT)
PREFILLED_SYRINGE | INTRAVENOUS | Status: AC
  Filled 2018-03-21: qty 10

## 2018-03-21 MED ORDER — IODIXANOL 320 MG/ML IV SOLN
INTRAVENOUS | Status: DC | PRN
  Administered 2018-03-21: 10 mL via INTRA_ARTERIAL

## 2018-03-21 MED ORDER — DEXAMETHASONE SODIUM PHOSPHATE 10 MG/ML IJ SOLN
INTRAMUSCULAR | Status: AC
  Filled 2018-03-21: qty 1

## 2018-03-21 MED ORDER — ATORVASTATIN CALCIUM 40 MG PO TABS
40.0000 mg | ORAL_TABLET | Freq: Every day | ORAL | Status: DC
  Administered 2018-03-21: 40 mg via ORAL
  Filled 2018-03-21: qty 1

## 2018-03-21 MED ORDER — GUAIFENESIN-DM 100-10 MG/5ML PO SYRP: 15.0000 mL | ORAL_SOLUTION | ORAL | Status: DC | PRN

## 2018-03-21 MED ORDER — CALCIUM CARBONATE-VITAMIN D 600-200 MG-UNIT PO TABS: 1.0000 | ORAL_TABLET | ORAL | Status: DC

## 2018-03-21 MED ORDER — MORPHINE SULFATE (PF) 2 MG/ML IV SOLN: 2.0000 mg | INTRAVENOUS | Status: DC | PRN

## 2018-03-21 MED ORDER — FENTANYL CITRATE (PF) 100 MCG/2ML IJ SOLN: 25.0000 ug | INTRAMUSCULAR | Status: DC | PRN

## 2018-03-21 MED ORDER — PROTAMINE SULFATE 10 MG/ML IV SOLN
INTRAVENOUS | Status: AC
  Filled 2018-03-21: qty 5

## 2018-03-21 MED ORDER — CEFAZOLIN SODIUM-DEXTROSE 2-4 GM/100ML-% IV SOLN
INTRAVENOUS | Status: AC
  Filled 2018-03-21: qty 100

## 2018-03-21 MED ORDER — PROPOFOL 10 MG/ML IV BOLUS
INTRAVENOUS | Status: AC
  Filled 2018-03-21: qty 20

## 2018-03-21 MED ORDER — PANTOPRAZOLE SODIUM 40 MG PO TBEC
40.0000 mg | DELAYED_RELEASE_TABLET | Freq: Every day | ORAL | Status: DC
  Administered 2018-03-21 – 2018-03-22 (×2): 40 mg via ORAL
  Filled 2018-03-21 (×2): qty 1

## 2018-03-21 MED ORDER — LABETALOL HCL 5 MG/ML IV SOLN: 10.0000 mg | INTRAVENOUS | Status: DC | PRN

## 2018-03-21 SURGICAL SUPPLY — 88 items
ADH SKN CLS APL DERMABOND .7 (GAUZE/BANDAGES/DRESSINGS) ×2
BAG DECANTER FOR FLEXI CONT (MISCELLANEOUS) IMPLANT
BAG SNAP BAND KOVER 36X36 (MISCELLANEOUS) ×2 IMPLANT
BALLN CODA OCL 2-9.0-35-120-3 (BALLOONS) ×2
BALLOON COD OCL 2-9.0-35-120-3 (BALLOONS) IMPLANT
CANISTER SUCT 3000ML PPV (MISCELLANEOUS) ×2 IMPLANT
CATH ANGIO 5F BER2 65CM (CATHETERS) IMPLANT
CATH BALLN ADVANCE 35LP (CATHETERS) ×1 IMPLANT
CATH BEACON 5 .035 65 KMP TIP (CATHETERS) ×1 IMPLANT
CATH OMNI FLUSH .035X70CM (CATHETERS) IMPLANT
CATH QUICKCROSS SUPP .035X90CM (MICROCATHETER) ×1 IMPLANT
COVER MAYO STAND STRL (DRAPES) ×1 IMPLANT
COVER PROBE W GEL 5X96 (DRAPES) ×2 IMPLANT
COVER WAND RF STERILE (DRAPES) ×2 IMPLANT
DERMABOND ADVANCED (GAUZE/BANDAGES/DRESSINGS) ×2
DERMABOND ADVANCED .7 DNX12 (GAUZE/BANDAGES/DRESSINGS) ×1 IMPLANT
DEVICE CLOSURE PERCLS PRGLD 6F (VASCULAR PRODUCTS) IMPLANT
DEVICE TORQUE H2O (MISCELLANEOUS) ×1 IMPLANT
DEVICE TORQUE KENDALL .025-038 (MISCELLANEOUS) IMPLANT
DRAIN CHANNEL 10F 3/8 F FF (DRAIN) IMPLANT
DRAIN CHANNEL 10M FLAT 3/4 FLT (DRAIN) IMPLANT
DRAPE ZERO GRAVITY STERILE (DRAPES) ×3 IMPLANT
DRSG TEGADERM 2-3/8X2-3/4 SM (GAUZE/BANDAGES/DRESSINGS) ×3 IMPLANT
DRYSEAL FLEXSHEATH 20FR 33CM (SHEATH) ×2
ELECT REM PT RETURN 9FT ADLT (ELECTROSURGICAL) ×4
ELECTRODE REM PT RTRN 9FT ADLT (ELECTROSURGICAL) ×2 IMPLANT
EVACUATOR 3/16  PVC DRAIN (DRAIN)
EVACUATOR 3/16 PVC DRAIN (DRAIN) IMPLANT
EVACUATOR SILICONE 100CC (DRAIN) IMPLANT
GAUZE SPONGE 2X2 8PLY STRL LF (GAUZE/BANDAGES/DRESSINGS) ×2 IMPLANT
GAUZE SPONGE 4X4 16PLY XRAY LF (GAUZE/BANDAGES/DRESSINGS) ×1 IMPLANT
GLOVE BIO SURGEON STRL SZ 6.5 (GLOVE) ×1 IMPLANT
GLOVE BIO SURGEON STRL SZ7.5 (GLOVE) ×1 IMPLANT
GLOVE BIOGEL PI IND STRL 6.5 (GLOVE) IMPLANT
GLOVE BIOGEL PI IND STRL 7.5 (GLOVE) ×1 IMPLANT
GLOVE BIOGEL PI INDICATOR 6.5 (GLOVE) ×3
GLOVE BIOGEL PI INDICATOR 7.5 (GLOVE) ×1
GLOVE SURG SS PI 7.5 STRL IVOR (GLOVE) ×2 IMPLANT
GOWN STRL REUS W/ TWL LRG LVL3 (GOWN DISPOSABLE) ×2 IMPLANT
GOWN STRL REUS W/ TWL XL LVL3 (GOWN DISPOSABLE) ×1 IMPLANT
GOWN STRL REUS W/TWL LRG LVL3 (GOWN DISPOSABLE) ×4
GOWN STRL REUS W/TWL XL LVL3 (GOWN DISPOSABLE) ×2
GRAFT BALLN CATH 65CM (STENTS) IMPLANT
GRAFT FEN DIST EVAR 12X28X91 (Graft) ×1 IMPLANT
GRAFT FEN PROX EVAR 32X124M (Graft) ×1 IMPLANT
GRAFT LEG ILIAC ZSLE-13-39-ZT (Endovascular Graft) ×1 IMPLANT
GRAFT LEG ILIAC ZSLE-13-90-ZT (Endovascular Graft) ×1 IMPLANT
GRAFT LEG ILIAC ZSLE-16-90-ZT (Endovascular Graft) ×1 IMPLANT
GUIDEWIRE ANGLED .035X260CM (WIRE) ×1 IMPLANT
HEMOSTAT SNOW SURGICEL 2X4 (HEMOSTASIS) IMPLANT
KIT BASIN OR (CUSTOM PROCEDURE TRAY) ×2 IMPLANT
KIT ENCORE 26 ADVANTAGE (KITS) ×2 IMPLANT
KIT TURNOVER KIT B (KITS) ×2 IMPLANT
NDL PERC 18GX7CM (NEEDLE) ×1 IMPLANT
NEEDLE PERC 18GX7CM (NEEDLE) ×2 IMPLANT
NS IRRIG 1000ML POUR BTL (IV SOLUTION) ×2 IMPLANT
PACK ENDOVASCULAR (PACKS) ×2 IMPLANT
PAD ARMBOARD 7.5X6 YLW CONV (MISCELLANEOUS) ×4 IMPLANT
PENCIL BUTTON HOLSTER BLD 10FT (ELECTRODE) ×1 IMPLANT
PERCLOSE PROGLIDE 6F (VASCULAR PRODUCTS) ×8
SET MICROPUNCTURE 5F STIFF (MISCELLANEOUS) ×1 IMPLANT
SHEATH AVANTI 11CM 8FR (SHEATH) IMPLANT
SHEATH BRITE TIP 8FR 23CM (SHEATH) ×1 IMPLANT
SHEATH DRYSEAL FLEX 20FR 33CM (SHEATH) IMPLANT
SHEATH GUIDING 7F 55X73X9MM TD (SHEATH) ×1 IMPLANT
SHEATH HIGHFLEX ANSEL 7FR 55CM (SHEATH) ×2 IMPLANT
SHEATH PINNACLE 8F 10CM (SHEATH) ×1 IMPLANT
SHIELD RADPAD SCOOP 12X17 (MISCELLANEOUS) ×6 IMPLANT
SPONGE GAUZE 2X2 STER 10/PKG (GAUZE/BANDAGES/DRESSINGS) ×2
STENT GRAFT BALLN CATH 65CM (STENTS) ×1
STENT VIABAHNBX 6X29X135 (Permanent Stent) ×2 IMPLANT
STOPCOCK MORSE 400PSI 3WAY (MISCELLANEOUS) ×3 IMPLANT
SUT ETHILON 3 0 PS 1 (SUTURE) IMPLANT
SUT PROLENE 5 0 C 1 24 (SUTURE) IMPLANT
SUT SILK 2 0 PERMA HAND 18 BK (SUTURE) ×1 IMPLANT
SUT VIC AB 2-0 CT1 27 (SUTURE)
SUT VIC AB 2-0 CT1 TAPERPNT 27 (SUTURE) IMPLANT
SUT VIC AB 3-0 SH 27 (SUTURE)
SUT VIC AB 3-0 SH 27X BRD (SUTURE) IMPLANT
SUT VICRYL 4-0 PS2 18IN ABS (SUTURE) ×4 IMPLANT
SYR 30ML LL (SYRINGE) IMPLANT
TOWEL GREEN STERILE (TOWEL DISPOSABLE) ×2 IMPLANT
TRAY FOLEY MTR SLVR 16FR STAT (SET/KITS/TRAYS/PACK) ×2 IMPLANT
TUBING HIGH PRESSURE 120CM (CONNECTOR) ×3 IMPLANT
WIRE AMPLATZ SS-J .035X180CM (WIRE) IMPLANT
WIRE BENTSON .035X145CM (WIRE) ×2 IMPLANT
WIRE ROSEN-J .035X260CM (WIRE) ×2 IMPLANT
WIRE STIFF LUNDERQUIST 260MM (WIRE) ×3 IMPLANT

## 2018-03-21 NOTE — Progress Notes (Signed)
Patient to floor in stable condition. A&O, bilateral groins level 2 with guaze dressings clean and dry and no c/ pain. Does state R leg/foot has some numbness but not complete. Bedrest until 1825 and he agrees. Telemetry applied and CCMD notified. Fmaily at bedside.

## 2018-03-21 NOTE — Progress Notes (Signed)
Dr. Trula Slade at bedside to evaluate patients LLE, no new orders given other than RN to continue closely monitor extremity for any changes, gave report to Seth Bake, RN on 4E and relayed the information.   Rowe Pavy, RN

## 2018-03-21 NOTE — Transfer of Care (Signed)
Immediate Anesthesia Transfer of Care Note  Patient: Jeffrey [name]  Procedure(s) Performed: ABDOMINAL AORTIC ENDOVASCULAR FENESTRATED STENT GRAFT (N/A )  Patient Location: PACU  Anesthesia Type:General  Level of Consciousness: awake, alert  and drowsy  Airway & Oxygen Therapy: Patient Spontanous Breathing and Patient connected to face mask oxygen  Post-op Assessment: Report given to RN and Post -op Vital signs reviewed and stable  Post vital signs: Reviewed and stable  Last Vitals:  Vitals Value Taken Time  BP 115/67 03/21/2018  2:40 PM  Temp    Pulse 70 03/21/2018  2:46 PM  Resp 21 03/21/2018  2:46 PM  SpO2 98 % 03/21/2018  2:46 PM  Vitals shown include unvalidated device data.  Last Pain:  Vitals:   03/21/18 0646  TempSrc:   PainSc: 1       Patients Stated Pain Goal: 3 (86/76/19 5093)  Complications: No apparent anesthesia complications

## 2018-03-21 NOTE — Progress Notes (Signed)
Received report from RN; Introduce staff and orient to room; Reviewed care plan; Pt A/o x4, pain 0/10; VSS; IV fluids running; Bed low position and alarm on; Call bell within reach; Will continue to monitor.

## 2018-03-21 NOTE — Progress Notes (Signed)
Pt is experiencing numbness in his LLE that's new, feet are warm, good cap refill, doppler pulses, notified Dr. Trula Slade. Dr. Trula Slade en route to evaluate patient.  Rowe Pavy, RN

## 2018-03-21 NOTE — Op Note (Signed)
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  Tourtuosity of iliacs resulted in inadvertant crossing of the iliac limbs.    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  We discussed open versus fenestrated endovascular repair.  He wanted to proceed with endovascular repair.  The risks and benefits of the procedure with all potential complications were discussed with the patient his family and he wished to proceed.   Devices Used:   COOK  P2-32-124 (R), D 06-16-90, L ZSLE 13x39, 16x90.  R ZSLE 13x90.  Both renals:  VBX 6x29  Procedure:  The patient was identified in the holding area and taken to [place] 16  The patient was then placed supine on the table. general anesthesia was administered.  The patient was prepped and draped in the usual sterile fashion.  A time out was called and antibiotics were administered.  Ultrasound was used to evaluate bilateral common femoral arteries which were widely patent without significant calcification.  A digital ultrasound image was acquired.  A #11 blade was used to make a skin nick.  Bilateral common femoral arteries were then cannulated under ultrasound guidance with a micropuncture needle.  A 018 wire was advanced without resistance and the micropuncture sheath was placed.  A 035 Bentson wire was inserted bilaterally.  The  subcutaneous tracts were dilated with an 8 [place] dilator.  Probe glide devices were placed on the right and left side at 11:00 and 1 o'clock position for pre-closure.  8 French sheaths were placed bilaterally.  The patient was fully heparinized.  Heparin levels were monitored with ACT measurements and redosed appropriately.  Using a Berenstein 2 catheter, with some difficulty I was able to gain access into the distal thoracic aorta on both sides and Lunderquist wires were placed.  55 French sheaths were then placed bilaterally.  At this time the markings from the CT scan overlays were placed on the screen.  The main body device was then prepared on the back table.  This was a Cook P2-32-124 device.  This was oriented outside of the body.  The 52 French sheath on the right was then removed and the main body device was then advanced into the abdominal aorta under fluoroscopic visualization.  A second access into the sheath in the left groin was obtained and over a Bentson wire, a pigtail catheter was placed at the level of L1 and an abdominal aortogram was performed in a LAO oblique.  This located bilateral renal arteries and the superior mesenteric artery.  The device was then deployed, such that the first 2 stents were open.  It appeared that the device on insertion had rotated and therefore had to rotate it clockwise in order to line on the fenestrations.  Once I was satisfied with the orientation of the device, the remaining portion of the main body was deployed.  I then used a Berenstein 2 catheter and a Glidewire to cannulate the graft from the left side.  The catheter was advanced into the main body and a Lunderquist wire was inserted.  The dilator for the 20 French sheath on the left was then reinserted and the 20 French sheath was advanced into the main body.  Next, the dilator was removed and a Oscore 7 [place] deflectable sheath was inserted through the 20 [place] sheath.  I then tried to cannulate the  left renal fenestration, however I was unable to get wire access out of the graft.  I shot a arteriogram which showed no visualization of the renal arteries.  After several other additional unsuccessful attempts at cannulating the left renal fenestration, I attempted to cannulate the right renal fenestration.  After some difficulty, I was able to get the tip of the sheath outside the fenestration with a wire into the descending thoracic aorta.  I performed a contrast injection through the sheath which revealed that the device was rotated posteriorly and was a little high.  I rotated the device and pulled it down some.  After this maneuver, I was able to to deflect the sheath into the right renal artery.  A Glidewire was then advanced out of the renal artery.  A 90 cm quick cross catheter was then advanced over the Glidewire which was then removed and a Rosen wire was placed.  A contrast injection was performed through the sheath which confirmed that I had successfully cannulated the right renal artery.  The Oscor sheath was removed and replaced with a 7 French 55 cm high flex Ansell 1 sheath.  Next, a second access to the 20 [place] sheath was obtained and the Oscor sheath was inserted.  I used this to cannulate the left renal fenestration.  A Glidewire was advanced out into the left renal artery.  A quick cross catheter was advanced over the Glidewire which was removed and exchanged for a Rosen wire.  A contrast injection was performed through the sheath which confirmed I cannulated the left renal artery. The Oscor sheath was removed and replaced with a 7 French 55 cm high flex Ansell sheath.  Next, a 6 x 29 VBX balloon expandable covered stent was advanced out into each renal artery.  Next, the top Was released.  The remaining attachments to the main body were removed and the delivery system was also removed and replaced with a 20 French dry seal sheath.  I then inserted a 32 mm Coda balloon which was used to mold  the top two ceiling stents.  I then deployed both the left and right renal stent with some of the proximal stent hanging out into the aorta.  A 10 x 20 balloon was used to rivet both renal artery stents.  I was unable to rivet the superior side of the stent because of the tortuosity within the iliac arteries were not and able me to deflected superiorly.  I then performed contrast injections through each Ansell 1 sheath which showed widely patent renal arteries bilaterally.  I then selected the distal main body which was a Cook the 06-16-90 device.  The device was oriented outside of the body.  The 20 French sheath in the right groin was then removed and the distal piece was inserted.  I performed a contrast injection through the sheath on the left side after removing the Rosen wires and [employer] 1 sheath.  This located the aortic bifurcation.  The appropriate amount  of seal between the proximal and distal device was obtained.  I then unsheathed the distal body down to the contralateral gate.  I then used a reverse curve catheter to cannulate the contralateral gate.  Because of the tortuosity in the iliac vessels, I was unable to insert a Coda balloon into the contralateral gate to confirm successful cannulation.  Therefore I elected to release the constraining removed on the proximal aspect of the distal device.  This enabled me to advance a Lunderquist wire up into the thoracic aorta.  After this maneuver I was able to advance a Coda balloon into the contralateral gate up into the main body.  The balloon was inflated.  It did mushroom, to confirm successful cannulation.  The image detector was then rotated to a right anterior oblique position and a retrograde injection for the sheath was performed which located the left hypogastric artery.  I did not have enough length to deploy one-piece for the iliac extension and therefore a bridging device was used.  I first inserted a Z SLE 13 x 39 device, followed by a Z SLE  13 x 90 device which was landed just proximal to the left hypogastric artery.  Next, the image detector was rotated to a left anterior oblique position and a retrograde injection was performed locating the right hypogastric artery.  I then deployed the remaining portion of the ipsilateral limb and selected the ipsilateral extension which was a Z SLE 13 x 90 device.  This landed in the distal right common iliac artery.  I then used a 32 mm Coda balloon to mold the device overlap and distal attachment sites.  It was apparent at this time that the contralateral gate had twisted.  I then elected to perform a completion arteriogram.  This showed wide patency of the superior mesenteric artery, bilateral renal arteries and the stents as well as the right limb of the graft in the right hypogastric artery.  There was contrast through the left limb however it was not easily visualized.  I was concerned that the rotation of the iliac limbs had created a stenosis.  I then inserted the Coda balloon and repeated balloon angioplasty of the left limb of the graft.  There did not appear to be any waist.  I then performed an additional arteriogram with the catheter located within the main body.  This time I aspirated on the left limb and there was excellent opacification of both the left and right iliac limb.  There was also pulsatile flow through the sheath in the left groin.  I looked at the graft and multiple views and did not see any significant stenosis within the limbs of the graft.  At this point, I was very happy with the repair.  The stiff wires were exchanged out for Bentson wires.  The sheaths within each groin were removed and the probe glide devices were used to close the arteriotomy site.  Both sites were hemostatic.  There is a palpable pulse within both femoral arteries as well as a brisk Doppler signal.  We also listen to the feet with Doppler signal and there were brisk signals bilaterally.  4-0 Vicryl used to close  the subcutaneous tissue followed by Dermabond.  Next, we performed a Dyna CT scan to get a better look at the limbs of the graft.  With this scan, there was no obvious compression of either limb of the graft.  The patient was then successfully extubated.  He was taken to  recovery in stable condition.  There were no obvious complications.   Disposition: To PACU stable   V. [name], M.D. [facility] Office: [phone] Pager:  [phone]

## 2018-03-21 NOTE — Progress Notes (Signed)
Foley catheter removed with tip intact.

## 2018-03-21 NOTE — Anesthesia Procedure Notes (Addendum)
Procedure Name: Intubation Date/Time: 03/21/2018 8:45 AM Performed by: Trinna Post., CRNA Pre-anesthesia Checklist: Patient identified, Emergency Drugs available, Suction available and Patient being monitored Patient Re-evaluated:Patient Re-evaluated prior to induction Oxygen Delivery Method: Circle System Utilized Preoxygenation: Pre-oxygenation with 100% oxygen Induction Type: IV induction Ventilation: Mask ventilation without difficulty Laryngoscope Size: Mac and 4 Grade View: Grade I Tube type: Oral Tube size: 7.5 mm Number of attempts: 1 Airway Equipment and Method: Stylet and Oral airway Placement Confirmation: ETT inserted through vocal cords under direct vision,  positive ETCO2 and breath sounds checked- equal and bilateral Secured at: 22 cm Tube secured with: Tape Dental Injury: Teeth and Oropharynx as per pre-operative assessment  Comments: Placed by Tia Alert, CRNA

## 2018-03-21 NOTE — H&P (Signed)
[facility]  Patient name: Jeffrey [name]            MRN: [id number]        DOB: 09-10-1940          Sex: male   REASON FOR VISIT:    AAA  HISOTRY OF PRESENT ILLNESS:    Jeffrey [name] is a 77 y.o. 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  He continues to be pain-free.  He continues to take a statin for hypercholesterolemia.  He is on an ARB for hypertension.  He does take an aspirin.  He is a former smoker.   PAST MEDICAL HISTORY:       Past Medical History:  Diagnosis Date  . AAA (abdominal aortic aneurysm) ([place])   . Allergy   . Arthritis   . Colon polyps   . Coronary artery calcification   . Eczematous dermatitis   . GERD (gastroesophageal reflux disease)   . Hypertension   . Iliac artery stenosis, left ([place])   . Osteopenia      FAMILY HISTORY:        Family History  Problem Relation Age of Onset  . Diabetes Mother   . Hypertension Mother   . Stroke Mother   . Alcohol abuse Father   . Heart attack Father   . Heart disease Father   . Hypertension Father   . Cancer Sister   . Drug abuse Sister   . Hyperlipidemia Sister   . Cancer Brother   . Asthma Brother   . Arthritis Brother   . Hypertension Son     SOCIAL HISTORY:   Social History        Tobacco Use  . Smoking status: Former Smoker    Packs/day: 1.00    Years: 32.00    Pack years: 32.00    Types: Cigarettes    Last attempt to quit: 08/17/1990    Years since quitting: 27.4  . Smokeless tobacco: Never Used  Substance Use Topics  . Alcohol use: Yes    Comment: Occassionally, sometimes daily sometimes weekly.      ALLERGIES:        Allergies  Allergen Reactions  . Ketotifen Fumarate Swelling    Eye swelling  . Triamcinolone Rash    HIVES     CURRENT MEDICATIONS:         Current Outpatient Medications    Medication Sig Dispense Refill  . alendronate (FOSAMAX) 70 MG tablet TAKE 1 TABLET BY MOUTH EVERY WEEK 12 tablet 0  . Ascorbic Acid (VITAMIN C) 1000 MG tablet Take 1,000 mg by mouth.    Marland Kitchen aspirin EC 81 MG tablet Take 81 mg by mouth daily.     Marland Kitchen atorvastatin (LIPITOR) 20 MG tablet TAKE ONE TABLET BY MOUTH AT 6PM 90 tablet 0  . Cyanocobalamin (RA VITAMIN B-12 TR) 1000 MCG TBCR Take 1,000 mcg by mouth daily.     . DOCOSAHEXAENOIC ACID PO Take 1 g by mouth.    . fexofenadine (ALLEGRA) 180 MG tablet Take 180 mg by mouth as needed.     . Glucosamine-Chondroitin 500-400 MG CAPS Take 2,000 mg by mouth daily.     Marland Kitchen ibuprofen (ADVIL,MOTRIN) 200 MG tablet Take 200 mg by mouth daily.     . IRON PO Take by mouth.    . losartan (COZAAR) 50 MG tablet Take 100 mg by mouth 2 (two) times daily.    Marland Kitchen  Multiple Vitamins-Minerals (CENTRUM ADULTS PO) Take by mouth daily.    . Multiple Vitamins-Minerals (MULTIVITAMIN ADULT PO) Take by mouth.    . Omega-3 1000 MG CAPS Take 3,600 mg by mouth daily.      No current facility-administered medications for this visit.     REVIEW OF SYSTEMS:   [X]  denotes positive finding, [ ]  denotes negative finding Cardiac  Comments:  Chest pain or chest pressure:    Shortness of breath upon exertion:    Short of breath when lying flat:    Irregular heart rhythm:        Vascular    Pain in calf, thigh, or hip brought on by ambulation:    Pain in feet at night that wakes you up from your sleep:     Blood clot in your veins:    Leg swelling:         Pulmonary    Oxygen at home:    Productive cough:     Wheezing:         Neurologic    Sudden weakness in arms or legs:     Sudden numbness in arms or legs:     Sudden onset of difficulty speaking or slurred speech:    Temporary loss of vision in one eye:     Problems with dizziness:         Gastrointestinal    Blood in stool:     Vomited  blood:         Genitourinary    Burning when urinating:     Blood in urine:        Psychiatric    Major depression:         Hematologic    Bleeding problems:    Problems with blood clotting too easily:        Skin    Rashes or ulcers:        Constitutional    Fever or chills:      PHYSICAL EXAM:      Vitals:   01/29/18 0853  BP: (!) 178/96  Pulse: 61  Resp: 20  SpO2: 97%  Weight: 216 lb 4.8 oz (98.1 kg)  Height: 5\' 10"  (1.778 m)    GENERAL: The patient is a well-nourished male, in no acute distress. The vital signs are documented above. CARDIAC: There is a regular rate and rhythm.  VASCULAR: I could not palpate pedal pulses. PULMONARY: Non-labored respirations ABDOMEN: Soft and non-tender with normal pitched bowel sounds.  MUSCULOSKELETAL: There are no major deformities or cyanosis. NEUROLOGIC: No focal weakness or paresthesias are detected. SKIN: There are no ulcers or rashes noted. PSYCHIATRIC: The patient has a normal affect.  STUDIES:   I have reviewed his CT scan which shows a 5.5 cm juxtarenal abdominal aortic aneurysm.  I spent 1.5 hours on Tera Recon creating his fenestrated stent graft.  MEDICAL ISSUES:   AAA: We discussed that his aneurysm could could be treated either with open repair or fenestrated endovascular repair.  I went over the details of each operation.  Ultimately he felt more comfortable proceeding with a endovascular repair.  Specific to this operation I discussed the risk of renal injury, intestinal ischemia, and lower extremity ischemia.  All of his questions were answered.  I am going to get carotid duplex, ABIs and popliteal duplex for preoperative imaging.  He will also be referred to cardiology for formal clearance.  He was placed on the schedule once his device has been received.  [name], MD [facility] [phone] Pager [phone]   No interval change.  Denies abdominal pain  PE:  CV RRR PULM: CTA ABD: soft  Plan for FEVAR today.  All questions answered   [name]

## 2018-03-21 NOTE — Anesthesia Procedure Notes (Addendum)
Arterial Line Insertion Start/End10/07/2017 7:35 AM, 03/21/2018 7:45 AM Performed by: White, Amedeo Plenty, Immunologist, CRNA  Patient location: Pre-op. Preanesthetic checklist: patient identified, IV checked, site marked, risks and benefits discussed, surgical consent, monitors and equipment checked, pre-op evaluation, timeout performed and anesthesia consent Lidocaine 1% used for infiltration Left, radial was placed Catheter size: 20 G Hand hygiene performed  and maximum sterile barriers used   Attempts: 2 Procedure performed without using ultrasound guided technique. Ultrasound Notes:anatomy identified and no ultrasound evidence of intravascular and/or intraneural injection Following insertion, dressing applied and Biopatch. Post procedure assessment: normal  Patient tolerated the procedure well with no immediate complications.

## 2018-03-21 NOTE — Progress Notes (Signed)
S/p FEVAR  C/O numbness in left foot as if it is asleep Brisk PT and DP signals bilaterally Calf soft without evidence of compartment syndrome  OK to transfer to floor  [employer]

## 2018-03-22 ENCOUNTER — Other Ambulatory Visit: Payer: Self-pay

## 2018-03-22 ENCOUNTER — Encounter (HOSPITAL_COMMUNITY): Payer: Self-pay | Admitting: Surgery

## 2018-03-22 LAB — CBC
HCT: 29.1 % — ABNORMAL LOW (ref 39.0–52.0)
Hemoglobin: 9.8 g/dL — ABNORMAL LOW (ref 13.0–17.0)
MCH: 33.4 pg (ref 26.0–34.0)
MCHC: 33.7 g/dL (ref 30.0–36.0)
MCV: 99.3 fL (ref 78.0–100.0)
PLATELETS: 114 10*3/uL — AB (ref 150–400)
RBC: 2.93 MIL/uL — AB (ref 4.22–5.81)
RDW: 13.5 % (ref 11.5–15.5)
WBC: 11.1 10*3/uL — AB (ref 4.0–10.5)

## 2018-03-22 LAB — BASIC METABOLIC PANEL
Anion gap: 8 (ref 5–15)
BUN: 15 mg/dL (ref 8–23)
CO2: 22 mmol/L (ref 22–32)
Calcium: 8.1 mg/dL — ABNORMAL LOW (ref 8.9–10.3)
Chloride: 104 mmol/L (ref 98–111)
Creatinine, Ser: 1.07 mg/dL (ref 0.61–1.24)
GLUCOSE: 139 mg/dL — AB (ref 70–99)
POTASSIUM: 3.9 mmol/L (ref 3.5–5.1)
Sodium: 134 mmol/L — ABNORMAL LOW (ref 135–145)

## 2018-03-22 LAB — POCT ACTIVATED CLOTTING TIME: ACTIVATED CLOTTING TIME: 230 s

## 2018-03-22 MED ORDER — OXYCODONE-ACETAMINOPHEN 5-325 MG PO TABS: 1.0000 | ORAL_TABLET | Freq: Four times a day (QID) | ORAL | 0 refills | Status: DC | PRN

## 2018-03-22 MED ORDER — CLOPIDOGREL BISULFATE 75 MG PO TABS: 75.0000 mg | ORAL_TABLET | Freq: Every day | ORAL | 2 refills | Status: AC

## 2018-03-22 MED FILL — CLOPIDOGREL 75 MG TABLET: 75 | 30 days supply | Qty: 30 | Fill #0 | Status: TO

## 2018-03-22 NOTE — Progress Notes (Addendum)
  Progress Note    03/22/2018 9:37 AM 1 Day Post-Op  Subjective:  L foot numbness   Vitals:   03/22/18 0430 03/22/18 0827  BP: 129/66 122/74  Pulse: 66 69  Resp: 17 17  Temp: 98.6 F (37 C) 99 F (37.2 C)  SpO2: 99% 100%   Physical Exam: Lungs:  Non labored Incisions:  B groins without hematoma or bleeding Extremities:  Multiphasic PT signals with doppler bilaterally Abdomen:  Soft, nontender Neurologic: A&O  CBC    Component Value Date/Time   WBC 11.1 (H) 03/22/2018 0425   RBC 2.93 (L) 03/22/2018 0425   HGB 9.8 (L) 03/22/2018 0425   HCT 29.1 (L) 03/22/2018 0425   PLT 114 (L) 03/22/2018 0425   MCV 99.3 03/22/2018 0425   MCH 33.4 03/22/2018 0425   MCHC 33.7 03/22/2018 0425   RDW 13.5 03/22/2018 0425    BMET    Component Value Date/Time   NA 134 (L) 03/22/2018 0425   K 3.9 03/22/2018 0425   CL 104 03/22/2018 0425   CO2 22 03/22/2018 0425   GLUCOSE 139 (H) 03/22/2018 0425   BUN 15 03/22/2018 0425   CREATININE 1.07 03/22/2018 0425   CREATININE 0.91 02/24/2017 1445   CALCIUM 8.1 (L) 03/22/2018 0425   GFRNONAA >60 03/22/2018 0425   GFRAA >60 03/22/2018 0425    INR    Component Value Date/Time   INR 1.04 03/13/2018 1322     Intake/Output Summary (Last 24 hours) at 03/22/2018 9833 Last data filed at 03/22/2018 0600 Gross per 24 hour  Intake 3105.93 ml  Output 1720 ml  Net 1385.93 ml     Assessment/Plan:  77 y.o. male is s/p endovascular repair of juxtarenal AAA 1 Day Post-Op   Numbness L foot however perfusing BLE well Foley out and able to void No abd pain; tolerating regular diet Ok for discharge home today Office will arrange follow up with CTA chest/abd/pelvis in 4 weeks Patient also started on 75mg  plavix daily for renal stent patency  Dagoberto Ligas, PA-C [facility] [phone] 03/22/2018 9:37 AM  Still with numbness in left foot.  No motor deficits in either legExcellent bilateral doppler signals in both legs.   Groins and abdomen soft and non-tender.  Tolerating PO.  Ambulating without difficulty.  Voiding without difficulty. Unknown etiology of left leg numbness.  Suspect neuropathic etiology either from hematoma in groin or ischemia given 47F sheath in left iliac for 3 hours.  Suspect this will resolve with time.  OK with discharge.  Patient will call if he has any changes in the symptoms in his left leg.  Discussed tortuosity of iliacs and angulation of left limb with patient.  Starting Plavix for 3 months.  Nita Sickle

## 2018-03-22 NOTE — Care Management Note (Signed)
Case Management Note Marvetta Gibbons RN, BSN Transitions of Care Unit 4E- RN Case Manager [phone]  Patient Details  Name: Jeffrey [name] MRN: [id number] Date of Birth: 05/06/1941  Subjective/Objective:   Pt admitted s/p EVAR                  Action/Plan: PTA pt lived at home, plan to transition back home today, no CM needs noted for transition home.   Expected Discharge Date:  03/22/18               Expected Discharge Plan:  Home/Self Care  In-House Referral:  NA  Discharge planning Services  CM Consult  Post Acute Care Choice:  NA Choice offered to:  NA  DME Arranged:    DME Agency:     HH Arranged:    HH Agency:     Status of Service:  Completed, signed off  If discussed at [place] of Stay Meetings, dates discussed:    Discharge Disposition: home/self care   Additional Comments:  Dawayne Patricia, RN 03/22/2018, 11:04 AM

## 2018-03-22 NOTE — Discharge Instructions (Signed)
   [facility] [place]   Discharge Instructions  Endovascular Aortic Aneurysm Repair  Please refer to the following instructions for your post-procedure care. Your surgeon or Physician Assistant will discuss any changes with you.  Activity  You are encouraged to walk as much as you can. You can slowly return to normal activities but must avoid strenuous activity and heavy lifting until your doctor tells you it's OK. Avoid activities such as vacuuming or swinging a gold club. It is normal to feel tired for several weeks after your surgery. Do not drive until your doctor gives the OK and you are no longer taking prescription pain medications. It is also normal to have difficulty with sleep habits, eating, and bowel movements after surgery. These will go away with time.  Bathing/Showering  You may shower after you go home. If you have an incision, do not soak in a bathtub, hot tub, or swim until the incision heals completely.  Incision Care  Shower every day. Clean your incision with mild soap and water. Pat the area dry with a clean towel. You do not need a bandage unless otherwise instructed. Do not apply any ointments or creams to your incision. If you clothing is irritating, you may cover your incision with a dry gauze pad.  Diet  Resume your normal diet. There are no special food restrictions following this procedure. A low fat/low cholesterol diet is recommended for all patients with vascular disease. In order to heal from your surgery, it is CRITICAL to get adequate nutrition. Your body requires vitamins, minerals, and protein. Vegetables are the best source of vitamins and minerals. Vegetables also provide the perfect balance of protein. Processed food has little nutritional value, so try to avoid this.  Medications  Resume taking all of your medications unless your doctor or nurse practitioner tells you not to. If your incision is causing pain, you may take  over-the-counter pain relievers such as acetaminophen (Tylenol). If you were prescribed a stronger pain medication, please be aware these medications can cause nausea and constipation. Prevent nausea by taking the medication with a snack or meal. Avoid constipation by drinking plenty of fluids and eating foods with a high amount of fiber, such as fruits, vegetables, and grains. Do not take Tylenol if you are taking prescription pain medications.   Follow up  Our office will schedule a follow-up appointment with a C.T. scan 3-4 weeks after your surgery.  Please call us immediately for any of the following conditions  Severe or worsening pain in your legs or feet or in your abdomen back or chest. Increased pain, redness, drainage (pus) from your incision sit. Increased abdominal pain, bloating, nausea, vomiting or persistent diarrhea. Fever of 101 degrees or higher. Swelling in your leg (s),  Reduce your risk of vascular disease  Stop smoking. If you would like help call [facility] at 1-800-QUIT-NOW (1-800-784-8669) or [place] at [phone]. Manage your cholesterol Maintain a desired weight Control your diabetes Keep your blood pressure down  If you have questions, please call the office at [phone].   

## 2018-03-22 NOTE — Anesthesia Postprocedure Evaluation (Signed)
Anesthesia Post Note  Patient: Jeffrey [name]  Procedure(s) Performed: ABDOMINAL AORTIC ENDOVASCULAR FENESTRATED STENT GRAFT ; DYNA CT PERFORMED (N/A )     Patient location during evaluation: PACU Anesthesia Type: General Level of consciousness: awake and alert Pain management: pain level controlled Vital Signs Assessment: post-procedure vital signs reviewed and stable Respiratory status: spontaneous breathing, nonlabored ventilation, respiratory function stable and patient connected to nasal cannula oxygen Cardiovascular status: blood pressure returned to baseline and stable Postop Assessment: no apparent nausea or vomiting Anesthetic complications: no    Last Vitals:  Vitals:   03/22/18 1100 03/22/18 1157  BP:  (!) 141/83  Pulse: 63 68  Resp: 14 18  Temp:  36.5 C  SpO2: 98% 100%    Last Pain:  Vitals:   03/22/18 1157  TempSrc: Oral  PainSc:                  Lidia Collum

## 2018-03-22 NOTE — Progress Notes (Signed)
@  0100 Pt In and Out Cathed per protocol. 410mL clear yellow urine returned without complication. Will continue to monitor for spontaneous void. Procedure observed by fellow RN, Corie Chiquito.

## 2018-03-22 NOTE — Progress Notes (Signed)
IV and telemetry discontinued. Discharge instructions reviewed with patient at this time. All questions answered. Patient given written RX for pain medication.

## 2018-03-22 NOTE — Discharge Summary (Addendum)
EVAR Discharge Summary   Jeffrey [name] 1941/06/06 77 y.o. male  MRN: [id number]  Admission Date: 03/21/2018  Discharge Date: 03/22/18  Physician: [name], MD  Admission Diagnosis: ABDOMINAL AORTIC ANEURYSM  Discharge Day services:    see progress note 03/22/18 Physical Exam: Vitals:   03/22/18 0430 03/22/18 0827  BP: 129/66 122/74  Pulse: 66 69  Resp: 17 17  Temp: 98.6 F (37 C) 99 F (37.2 C)  SpO2: 99% 100%    Hospital Course:  The patient was admitted to the hospital and taken to the operating room on 03/21/2018 and underwent: #1:  Endovascular repair of juxtarenal abdominal aortic aneurysm using device with renal fenestration x 2 and SMA scallop #2:  Bilateral u/s guided femoral artery access #3:  Left renal stent #4:  Right renal stent #5:  Distal extension #6:  Abdominal aortogram and bilateral renal angiograms     The pt tolerated the procedure well and was transported to the PACU in good condition.   POD #1 he is perfusing BLE well despite L foot numbness.  He is tolerating a home diet, ambulating, and voiding without foley.  He will be prescribed daily 75mg  plavix for renal stent patency.  He will also be prescribed 2-3 days of narcotic pain medication for continued post operative pain control.  The remainder of the hospital course consisted of increasing mobilization and increasing intake of solids without difficulty.  Discharge instructions were reviewed with the patient and he voices his understanding.  He will be discharged to home in stable condition.  CBC    Component Value Date/Time   WBC 11.1 (H) 03/22/2018 0425   RBC 2.93 (L) 03/22/2018 0425   HGB 9.8 (L) 03/22/2018 0425   HCT 29.1 (L) 03/22/2018 0425   PLT 114 (L) 03/22/2018 0425   MCV 99.3 03/22/2018 0425   MCH 33.4 03/22/2018 0425   MCHC 33.7 03/22/2018 0425   RDW 13.5 03/22/2018 0425    BMET    Component Value Date/Time   NA 134 (L) 03/22/2018 0425   K 3.9 03/22/2018  0425   CL 104 03/22/2018 0425   CO2 22 03/22/2018 0425   GLUCOSE 139 (H) 03/22/2018 0425   BUN 15 03/22/2018 0425   CREATININE 1.07 03/22/2018 0425   CREATININE 0.91 02/24/2017 1445   CALCIUM 8.1 (L) 03/22/2018 0425   GFRNONAA >60 03/22/2018 0425   GFRAA >60 03/22/2018 0425         Discharge Diagnosis:  ABDOMINAL AORTIC ANEURYSM  Secondary Diagnosis: Patient Active Problem List   Diagnosis Date Noted  . AAA (abdominal aortic aneurysm) ([place]) 03/21/2018  . Osteopenia 02/24/2017  . Abdominal aortic aneurysm (AAA) without rupture ([place]) 07/06/2015  . Basal cell carcinoma 07/06/2015  . Essential (primary) hypertension 07/06/2015  . History of colon polyps 07/06/2015  . HLD (hyperlipidemia) 07/06/2015   Past Medical History:  Diagnosis Date  . AAA (abdominal aortic aneurysm) ([place])   . Allergy   . Arthritis   . Colon polyps   . Coronary artery calcification   . Eczematous dermatitis   . GERD (gastroesophageal reflux disease)   . Hypertension   . Iliac artery stenosis, left ([place])   . Osteopenia      Allergies as of 03/22/2018      Reactions   Ketotifen Fumarate Swelling, Other (See Comments)   Eye swelling   Triamcinolone Hives, Rash      Medication List    TAKE these medications   acetaminophen 500 MG tablet Commonly  known as:  TYLENOL Take 1,000 mg by mouth every 6 (six) hours as needed for moderate pain or headache.   alendronate 70 MG tablet Commonly known as:  FOSAMAX TAKE 1 TABLET BY MOUTH EVERY WEEK What changed:  when to take this   aspirin EC 81 MG tablet Take 81 mg by mouth daily.   atorvastatin 40 MG tablet Commonly known as:  LIPITOR Take 1 tablet (40 mg total) by mouth daily at 6 PM.   CALCIUM 600+D 600-200 MG-UNIT Tabs Generic drug:  Calcium Carbonate-Vitamin D Take 1 tablet by mouth every other day.   CENTRUM ADULTS PO Take 1 tablet by mouth daily.   CERAVE Lotn Apply 1 application topically daily as needed (after shower care).     clopidogrel 75 MG tablet Commonly known as:  PLAVIX Take 1 tablet (75 mg total) by mouth daily.   ferrous sulfate 325 (65 FE) MG tablet Take 325 mg by mouth every Wednesday.   fexofenadine 180 MG tablet Commonly known as:  ALLEGRA Take 180 mg by mouth daily as needed for allergies.   fluticasone 50 MCG/ACT nasal spray Commonly known as:  FLONASE Place 1 spray into both nostrils every other day.   GLUCOSAMINE-CHONDROITIN PO Take 1 tablet by mouth 2 (two) times daily.   ibuprofen 200 MG tablet Commonly known as:  ADVIL,MOTRIN Take 200 mg by mouth every 6 (six) hours as needed for headache or moderate pain.   losartan 25 MG tablet Commonly known as:  COZAAR TAKE 1 TABLET BY MOUTH TWICE DAILY   Omega-3 1000 MG Caps Take 1,000-2,000 mg by mouth See admin instructions. Take 1000 mg by mouth in the morning and take 2000 mg by mouth in the evening after supper   oxyCODONE-acetaminophen 5-325 MG tablet Commonly known as:  PERCOCET/ROXICET Take 1 tablet by mouth every 6 (six) hours as needed for moderate pain.   RA VITAMIN B-12 TR 1000 MCG Tbcr Generic drug:  Cyanocobalamin Take 1,000 mcg by mouth daily.   vitamin C 1000 MG tablet Take 1,000 mg by mouth daily.        [facility]  Discharge Instructions Endovascular Aortic Aneurysm Repair  Please refer to the following instructions for your post-procedure care. Your surgeon or Physician Assistant will discuss any changes with you.  Activity  You are encouraged to walk as much as you can. You can slowly return to normal activities but must avoid strenuous activity and heavy lifting until your doctor tells you it's OK. Avoid activities such as vacuuming or swinging a gold club. It is normal to feel tired for several weeks after your surgery. Do not drive until your doctor gives the OK and you are no longer taking prescription pain medications. It is also normal to have difficulty with sleep  habits, eating, and bowel movements after surgery. These will go away with time.  Bathing/Showering  You may shower after you go home. If you have an incision, do not soak in a bathtub, hot tub, or swim until the incision heals completely.  Incision Care  Shower every day. Clean your incision with mild soap and water. Pat the area dry with a clean towel. You do not need a bandage unless otherwise instructed. Do not apply any ointments or creams to your incision. If you clothing is irritating, you may cover your incision with a dry gauze pad.  Diet  Resume your normal diet. There are no special food restrictions following this procedure. A low  fat/low cholesterol diet is recommended for all patients with vascular disease. In order to heal from your surgery, it is CRITICAL to get adequate nutrition. Your body requires vitamins, minerals, and protein. Vegetables are the best source of vitamins and minerals. Vegetables also provide the perfect balance of protein. Processed food has little nutritional value, so try to avoid this.  Medications  Resume taking all of your medications unless your doctor or Physician Assistnat tells you not to. If your incision is causing pain, you may take over-the-counter pain relievers such as acetaminophen (Tylenol). If you were prescribed a stronger pain medication, please be aware these medications can cause nausea and constipation. Prevent nausea by taking the medication with a snack or meal. Avoid constipation by drinking plenty of fluids and eating foods with a high amount of fiber, such as fruits, vegetables, and grains. Do not take Tylenol if you are taking prescription pain medications.   Follow up  [place] office will schedule a follow-up appointment with a C.T. scan 3-4 weeks after your surgery.  Please call us immediately for any of the following conditions  Severe or worsening pain in your legs or feet or in your abdomen back or chest. Increased pain,  redness, drainage (pus) from your incision sit. Increased abdominal pain, bloating, nausea, vomiting or persistent diarrhea. Fever of 101 degrees or higher. Swelling in your leg (s),  Reduce your risk of vascular disease  .Stop smoking. If you would like help call [facility] at 1-800-QUIT-NOW 407-453-5045) or [place] at 6197876521. .Manage your cholesterol .Maintain a desired weight .Control your diabetes .Keep your blood pressure down  If you have questions, please call the office at [phone].   Disposition: home  Patient's condition: is Good  Follow up: 1. Dr. Trula Slade in 4 weeks with CTA protocol   Dagoberto Ligas, PA-C [facility] [phone] 03/22/2018  9:41 AM   - For VQI Registry use - Post-op:  Time to Extubation: [x]  In OR, [ ]  < 12 hrs, [ ]  12-24 hrs, [ ]  >=24 hrs Vasopressors Req. Post-op: No MI: No., [ ]  Troponin only, [ ]  EKG or Clinical New Arrhythmia: No CHF: No ICU Stay: 0 days Transfusion: No   Complications: Resp failure: No., [ ]  Pneumonia, [ ]  Ventilator Chg in renal function: No., [ ]  Inc. Cr > 0.5, [ ]  Temp. Dialysis,  [ ]  Permanent dialysis Leg ischemia: No., no Surgery needed, [ ]  Yes, Surgery needed,  [ ]  Amputation Bowel ischemia: No., [ ]  Medical Rx, [ ]  Surgical Rx Wound complication: No., [ ]  Superficial separation/infection, [ ]  Return to OR Return to OR: No  Return to OR for bleeding: No Stroke: No., [ ]  Minor, [ ]  Major  Discharge medications: Statin use:  Yes  ASA use:  Yes  Plavix use:  Yes  Beta blocker use:  No  ARB use:  Yes ACEI use:  No CCB use:  No

## 2018-03-27 ENCOUNTER — Other Ambulatory Visit: Payer: Self-pay

## 2018-03-27 DIAGNOSIS — I714 Abdominal aortic aneurysm, without rupture, unspecified: Secondary | ICD-10-CM

## 2018-03-29 ENCOUNTER — Other Ambulatory Visit: Payer: Self-pay

## 2018-03-30 ENCOUNTER — Telehealth: Payer: Self-pay | Admitting: Surgery

## 2018-03-30 NOTE — Telephone Encounter (Signed)
sch appt lvm mld ltr 04/16/18 CTA chest/abd/pelvis 04/23/18 830am f/u MD

## 2018-04-16 ENCOUNTER — Ambulatory Visit
Admission: RE | Admit: 2018-04-16 | Discharge: 2018-04-16 | Disposition: A | Discharge status: Home / Self Care | Payer: Medicare Other | Source: Ambulatory Visit | Attending: Surgery | Admitting: Surgery

## 2018-04-16 DIAGNOSIS — I714 Abdominal aortic aneurysm, without rupture, unspecified: Secondary | ICD-10-CM

## 2018-04-16 DIAGNOSIS — I712 Thoracic aortic aneurysm, without rupture: Secondary | ICD-10-CM | POA: Diagnosis not present

## 2018-04-16 MED ORDER — IOPAMIDOL (ISOVUE-370) INJECTION 76%
75.0000 mL | Freq: Once | INTRAVENOUS | Status: AC | PRN
  Administered 2018-04-16: 75 mL via INTRAVENOUS

## 2018-04-19 ENCOUNTER — Other Ambulatory Visit: Payer: Self-pay | Admitting: Family Medicine

## 2018-04-23 ENCOUNTER — Other Ambulatory Visit: Payer: Self-pay

## 2018-04-23 ENCOUNTER — Ambulatory Visit: Payer: Self-pay | Admitting: Surgery

## 2018-04-23 ENCOUNTER — Encounter: Payer: Self-pay | Admitting: Surgery

## 2018-04-23 VITALS — BP 155/89 | HR 60 | Resp 18 | Ht 70.0 in | Wt 207.3 lb

## 2018-04-23 DIAGNOSIS — I714 Abdominal aortic aneurysm, without rupture, unspecified: Secondary | ICD-10-CM

## 2018-04-23 NOTE — Progress Notes (Signed)
Patient name: Jeffrey [name] MRN: [id number] DOB: [birth year] Sex: male  REASON FOR VISIT:     post op  HISTORY OF PRESENT ILLNESS:   Jeffrey [name] is a 77 y.o. male who returns today for his first post op follow up.  He is status post FEVAR with a scalloped to the superior mesenteric artery and bilateral renal stents on March 21, 2018.  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  He did have some left foot numbness following the procedure which improved with time.  He has no complaints.  The numbness in his left leg resolved after about 4 days.  CURRENT MEDICATIONS:    Current Outpatient Medications  Medication Sig Dispense Refill  . acetaminophen (TYLENOL) 500 MG tablet Take 1,000 mg by mouth every 6 (six) hours as needed for moderate pain or headache.    . alendronate (FOSAMAX) 70 MG tablet Take 1 tablet (70 mg total) by mouth every Sunday. 12 tablet 0  . Ascorbic Acid (VITAMIN C) 1000 MG tablet Take 1,000 mg by mouth daily.     Marland Kitchen aspirin EC 81 MG tablet Take 81 mg by mouth daily.     Marland Kitchen atorvastatin (LIPITOR) 40 MG tablet Take 1 tablet (40 mg total) by mouth daily at 6 PM. 90 tablet 3  . Calcium Carbonate-Vitamin D (CALCIUM 600+D) 600-200 MG-UNIT TABS Take 1 tablet by mouth every other day.     . clopidogrel (PLAVIX) 75 MG tablet Take 1 tablet (75 mg total) by mouth daily. 30 tablet 2  . Cyanocobalamin (RA VITAMIN B-12 TR) 1000 MCG TBCR Take 1,000 mcg by mouth daily.     . Emollient (CERAVE) LOTN Apply 1 application topically daily as needed (after shower care).     . ferrous sulfate 325 (65 FE) MG tablet Take 325 mg by mouth every Wednesday.    . fexofenadine (ALLEGRA) 180 MG tablet Take 180 mg by mouth daily as needed for allergies.     . fluticasone (FLONASE) 50 MCG/ACT nasal spray Place 1 spray into both nostrils every other day.    Marland Kitchen GLUCOSAMINE-CHONDROITIN PO Take 1 tablet by mouth 2 (two) times daily.     Marland Kitchen ibuprofen (ADVIL,MOTRIN) 200  MG tablet Take 200 mg by mouth every 6 (six) hours as needed for headache or moderate pain.    Marland Kitchen losartan (COZAAR) 25 MG tablet TAKE 1 TABLET BY MOUTH TWICE DAILY (Patient taking differently: Take 25 mg by mouth 2 (two) times daily. ) 180 tablet 0  . Multiple Vitamins-Minerals (CENTRUM ADULTS PO) Take 1 tablet by mouth daily.     . Omega-3 1000 MG CAPS Take 1,000-2,000 mg by mouth See admin instructions. Take 1000 mg by mouth in the morning and take 2000 mg by mouth in the evening after supper    . oxyCODONE-acetaminophen (PERCOCET/ROXICET) 5-325 MG tablet Take 1 tablet by mouth every 6 (six) hours as needed for moderate pain. (Patient not taking: Reported on 04/23/2018) 15 tablet 0   No current facility-administered medications for this visit.     REVIEW OF SYSTEMS:   [X]  denotes positive finding, [ ]  denotes negative finding Cardiac  Comments:  Chest pain or chest pressure:    Shortness of breath upon exertion:    Short of breath when lying flat:    Irregular heart rhythm:    Constitutional    Fever or chills:      PHYSICAL EXAM:   Vitals:   04/23/18 0826  BP: (!) 155/89  Pulse:  60  Resp: 18  SpO2: 97%  Weight: 207 lb 4.8 oz (94 kg)  Height: 5\' 10"  (1.778 m)    GENERAL: The patient is a well-nourished male, in no acute distress. The vital signs are documented above. CARDIOVASCULAR: There is a regular rate and rhythm. 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 The excluded aneurysm sac is unchanged in size with no evidence of endoleak on the current CT.  Diameter of the ascending aorta measures 4.1 cm.  MEDICAL ISSUES:   Patient is doing very well at this time.  I will schedule him for follow-up and 6 months with a CT angiogram of the chest abdomen and pelvis.  He does have a a sending aortic aneurysm and will need to be  referred to CT surgery at some point.  I will likely make that referral after his follow-up in 6 months.  We discussed stopping the Plavix after a total of 6 weeks postop.  [name], MD [facility] [phone] Pager [phone]

## 2018-05-01 ENCOUNTER — Other Ambulatory Visit: Payer: Self-pay | Admitting: Family Medicine

## 2018-05-03 ENCOUNTER — Other Ambulatory Visit: Payer: Self-pay | Admitting: Family Medicine

## 2018-05-04 ENCOUNTER — Encounter: Payer: Self-pay | Admitting: Family Medicine

## 2018-05-06 ENCOUNTER — Encounter: Payer: Self-pay | Admitting: Family Medicine

## 2018-06-19 ENCOUNTER — Encounter: Payer: Self-pay | Admitting: Family Medicine

## 2018-07-03 ENCOUNTER — Ambulatory Visit: Payer: Medicare Other | Admitting: Vascular Surgery

## 2018-07-03 ENCOUNTER — Other Ambulatory Visit (HOSPITAL_COMMUNITY): Payer: Medicare Other

## 2018-07-08 ENCOUNTER — Other Ambulatory Visit: Payer: Self-pay | Admitting: Family Medicine

## 2018-07-20 ENCOUNTER — Encounter: Payer: Self-pay | Admitting: Family Medicine

## 2018-09-18 ENCOUNTER — Other Ambulatory Visit: Payer: Self-pay | Admitting: Physician Assistant

## 2018-09-18 DIAGNOSIS — D485 Neoplasm of uncertain behavior of skin: Secondary | ICD-10-CM | POA: Diagnosis not present

## 2018-09-18 DIAGNOSIS — L570 Actinic keratosis: Secondary | ICD-10-CM | POA: Diagnosis not present

## 2018-09-27 ENCOUNTER — Other Ambulatory Visit: Payer: Self-pay | Admitting: Family Medicine

## 2018-10-25 ENCOUNTER — Other Ambulatory Visit: Payer: Self-pay | Admitting: Family Medicine

## 2018-11-19 ENCOUNTER — Other Ambulatory Visit: Payer: Self-pay | Admitting: Surgery

## 2018-11-19 DIAGNOSIS — I714 Abdominal aortic aneurysm, without rupture, unspecified: Secondary | ICD-10-CM

## 2018-12-13 ENCOUNTER — Other Ambulatory Visit: Payer: Self-pay | Admitting: Vascular Surgery

## 2018-12-16 ENCOUNTER — Other Ambulatory Visit: Payer: Self-pay | Admitting: Family Medicine

## 2018-12-17 ENCOUNTER — Ambulatory Visit
Admission: RE | Admit: 2018-12-17 | Discharge: 2018-12-17 | Disposition: A | Discharge status: Home / Self Care | Payer: Medicare Other | Source: Ambulatory Visit | Attending: Vascular Surgery | Admitting: Vascular Surgery

## 2018-12-17 DIAGNOSIS — Z95828 Presence of other vascular implants and grafts: Secondary | ICD-10-CM | POA: Diagnosis not present

## 2018-12-17 DIAGNOSIS — I714 Abdominal aortic aneurysm, without rupture, unspecified: Secondary | ICD-10-CM

## 2018-12-17 DIAGNOSIS — T82858A Stenosis of vascular prosthetic devices, implants and grafts, initial encounter: Secondary | ICD-10-CM | POA: Diagnosis not present

## 2018-12-17 MED ORDER — IOPAMIDOL (ISOVUE-370) INJECTION 76%
75.0000 mL | Freq: Once | INTRAVENOUS | Status: AC | PRN
  Administered 2018-12-17: 75 mL via INTRAVENOUS

## 2018-12-19 ENCOUNTER — Telehealth: Payer: Self-pay | Admitting: *Deleted

## 2018-12-24 ENCOUNTER — Ambulatory Visit (INDEPENDENT_AMBULATORY_CARE_PROVIDER_SITE_OTHER): Payer: Medicare Other | Admitting: Surgery

## 2018-12-24 ENCOUNTER — Encounter: Payer: Self-pay | Admitting: Surgery

## 2018-12-24 ENCOUNTER — Other Ambulatory Visit: Payer: Self-pay

## 2018-12-24 DIAGNOSIS — I714 Abdominal aortic aneurysm, without rupture, unspecified: Secondary | ICD-10-CM

## 2018-12-24 NOTE — Progress Notes (Signed)
[facility]  Patient name: Jeffrey [name] MRN: [id number] DOB: Feb 06, 1941 Sex: male      Virtual Visit via Video Note   This visit type was conducted due to national recommendations for restrictions regarding the COVID-19 Pandemic (e.g. social distancing) in an effort to limit this patient's exposure and mitigate transmission in our community.  Due to his co-morbid illnesses, this patient is at least at moderate risk for complications without adequate follow up.  This format is felt to be most appropriate for this patient at this time.  All issues noted in this document were discussed and addressed.  A limited physical exam was performed with this format.  Please refer to the patient's chart for his consent to telehealth for [facility].   Patient Location: Home Provider Location: Office    REASON FOR APPOINTMENT:    Follow up  HISTORY OF PRESENT ILLNESS:    Massimiliano Rohleder is a 78 y.o. male who returns today for follow up  He is status post FEVAR with a scalloped to the superior mesenteric artery and bilateral renal stents on March 21, 2018.  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  He did have some left foot numbness following the procedure which improved with time.   He continues to take a statin for hypercholesterolemia.  He is on an ARB for hypertension.  He does take an aspirin.  He is a former smoker.    The patient does not have symptoms concerning for COVID-19 infection (fever, chills, cough, or new shortness of breath).   PAST MEDICAL HISTORY    Past Medical History:  Diagnosis Date  . AAA (abdominal aortic aneurysm) ([place])   . Allergy   . Arthritis   . Colon polyps   . Coronary artery calcification   . Eczematous dermatitis   . GERD (gastroesophageal reflux disease)   . Hypertension   . Iliac artery stenosis, left ([place])   . Osteopenia      FAMILY HISTORY   Family History  Problem Relation Age of Onset   . Diabetes Mother   . Hypertension Mother   . Stroke Mother   . Alcohol abuse Father   . Heart attack Father   . Heart disease Father   . Hypertension Father   . Cancer Sister   . Hyperlipidemia Sister   . Pancreatic cancer Sister   . Pancreatic cancer Brother   . Hypertension Son     SOCIAL HISTORY:   Social History   Socioeconomic History  . Marital status: Divorced    Spouse name: Not on file  . Number of children: 1  . Years of education: Not on file  . Highest education level: Not on file  Occupational History  . Occupation: Retired    Fish farm manager: [employer]  . Financial resource strain: Not on file  . Food insecurity    Worry: Not on file    Inability: Not on file  . Transportation needs    Medical: Not on file    Non-medical: Not on file  Tobacco Use  . Smoking status: Former Smoker    Packs/day: 1.00    Years: 32.00    Pack years: 32.00    Types: Cigarettes    Quit date: 08/17/1990    Years since quitting: 28.3  . Smokeless tobacco: Never Used  Substance and Sexual Activity  . Alcohol use:  Yes    Comment: Occassionally, "when I feel like it"  . Drug use: No  . Sexual activity: Yes    Partners: Female  Lifestyle  . Physical activity    Days per week: Not on file    Minutes per session: Not on file  . Stress: Not on file  Relationships  . Social Herbalist on phone: Not on file    Gets together: Not on file    Attends religious service: Not on file    Active member of club or organization: Not on file    Attends meetings of clubs or organizations: Not on file    Relationship status: Not on file  . Intimate partner violence    Fear of current or ex partner: Not on file    Emotionally abused: Not on file    Physically abused: Not on file    Forced sexual activity: Not on file  Other Topics Concern  . Not on file  Social History Narrative  . Not on file    ALLERGIES:    Allergies  Allergen Reactions  .  Ketotifen Fumarate Swelling and Other (See Comments)    Eye swelling  . Triamcinolone Hives and Rash    CURRENT MEDICATIONS:    Current Outpatient Medications  Medication Sig Dispense Refill  . acetaminophen (TYLENOL) 500 MG tablet Take 1,000 mg by mouth every 6 (six) hours as needed for moderate pain or headache.    . alendronate (FOSAMAX) 70 MG tablet TAKE 1 TABLET BY MOUTH WEEKLY 12 tablet 0  . Ascorbic Acid (VITAMIN C) 1000 MG tablet Take 1,000 mg by mouth daily.     Marland Kitchen aspirin EC 81 MG tablet Take 81 mg by mouth daily.     Marland Kitchen atorvastatin (LIPITOR) 20 MG tablet TAKE ONE TABLET BY MOUTH AT 6PM 90 tablet 0  . atorvastatin (LIPITOR) 40 MG tablet Take 1 tablet (40 mg total) by mouth daily at 6 PM. 90 tablet 3  . Calcium Carbonate-Vitamin D (CALCIUM 600+D) 600-200 MG-UNIT TABS Take 1 tablet by mouth every other day.     . Cyanocobalamin (RA VITAMIN B-12 TR) 1000 MCG TBCR Take 1,000 mcg by mouth daily.     . Emollient (CERAVE) LOTN Apply 1 application topically daily as needed (after shower care).     . ferrous sulfate 325 (65 FE) MG tablet Take 325 mg by mouth every Wednesday.    . fexofenadine (ALLEGRA) 180 MG tablet Take 180 mg by mouth daily as needed for allergies.     . fluticasone (FLONASE) 50 MCG/ACT nasal spray Place 1 spray into both nostrils every other day.    Marland Kitchen GLUCOSAMINE-CHONDROITIN PO Take 1 tablet by mouth 2 (two) times daily.     Marland Kitchen ibuprofen (ADVIL,MOTRIN) 200 MG tablet Take 200 mg by mouth every 6 (six) hours as needed for headache or moderate pain.    Marland Kitchen losartan (COZAAR) 25 MG tablet TAKE 1 TABLET(25 MG) BY MOUTH TWICE DAILY 180 tablet 1  . Multiple Vitamins-Minerals (CENTRUM ADULTS PO) Take 1 tablet by mouth daily.     . Omega-3 1000 MG CAPS Take 1,000-2,000 mg by mouth See admin instructions. Take 1000 mg by mouth in the morning and take 2000 mg by mouth in the evening after supper     No current facility-administered medications for this visit.     REVIEW OF  SYSTEMS:   Please see the history of present illness.     All other systems  reviewed and are negative.  PHYSICAL EXAM:   There were no vitals filed for this visit.  GENERAL: The patient is a well-nourished male, in no acute distress.  PULMONARY: Nonlabored respirations ABDOMEN: Soft and non-tender with normal pitched bowel sounds.  PSYCHIATRIC: The patient has a normal affect.   Recent Labs: 03/14/2018: ALT 19 03/22/2018: BUN 15; Creatinine, Ser 1.07; Hemoglobin 9.8; Platelets 114; Potassium 3.9; Sodium 134   Recent Lipid Panel Lab Results  Component Value Date/Time   CHOL 117 03/14/2018 11:50 AM   TRIG 71.0 03/14/2018 11:50 AM   HDL 57.00 03/14/2018 11:50 AM   CHOLHDL 2 03/14/2018 11:50 AM   LDLCALC 46 03/14/2018 11:50 AM   LDLCALC 46 02/24/2017 02:45 PM    Wt Readings from Last 3 Encounters:  04/23/18 207 lb 4.8 oz (94 kg)  03/21/18 200 lb (90.7 kg)  03/14/18 207 lb 6.4 oz (94.1 kg)     STUDIES:   I have reviewed his CTA with the following findings 1. Fusiform infrarenal abdominal aortic aneurysm status post aorto bi-iliac stent graft which remains patent, with slight decreased size of excluded aneurysm sac, with no findings to suggest endoleak on today's examination. 2. Stenosis at the ostium of the left limb of the stent graft. 3. 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 Assessment for potential risk factor modification, dietary therapy or pharmacologic therapy may be warranted, if clinically indicated. 4. Multiple small 2-3 mm pulmonary nodules scattered throughout the lungs bilaterally, stable compared to the prior study from 04/16/2018, strongly favored to represent benign nodules. Attention at time of repeat imaging is recommended to ensure continued stability. 5. Additional incidental findings, as above.  ASSESSMENT and PLAN   AAA: The patient has undergone successful stent grafting of a  juxtarenal aneurysm.  His CT scan today shows that the aneurysm has decreased in size.  There is the possibility of a ostial stenosis at the origin of the left limb of the graft.  I think this is partly secondary to the tortuosity in the pelvis.  Patient is currently not having any leg symptoms.  I have recommended that we repeat his CT scan in 6 months.  I will also get ABIs to make sure they have not changed significantly.  Carotid: No significant preoperative  Cardiac:  There were some cardiac findings on his CTA in addition to a ascending aortic aneurysm.  I will schedule him to follow up with Dr. Harrell Gave for this   Time:   Today, I have spent 21 minutes with the patient with telehealth technology discussing the above problems.        Leia Alf, MD, FACS [facility] [phone] Pager [phone]

## 2018-12-26 ENCOUNTER — Telehealth: Payer: Self-pay | Admitting: Cardiology

## 2018-12-26 NOTE — Telephone Encounter (Signed)
I called pt to confirm his appt on 12-27-18 with Dr Harrell Gave.     1. 3. Confirm Consent - "In the setting of the current Covid19 crisis, you are scheduled for a (phone or video) visit with your provider on (date) at (time).  Just as we do with many in-office visits, in order for you to participate in this visit, we must obtain consent.  If you'd like, I can send this to your mychart (if signed up) or email for you to review.  Otherwise, I can obtain your verbal consent now.  All virtual visits are billed to your insurance company just like a normal visit would be.  By agreeing to a virtual visit, we'd like you to understand that the technology does not allow for your provider to perform an examination, and thus may limit your provider's ability to fully assess your condition. If your provider identifies any concerns that need to be evaluated in person, we will make arrangements to do so.  Finally, though the technology is pretty good, we cannot assure that it will always work on either your or our end, and in the setting of a video visit, we may have to convert it to a phone-only visit.  In either situation, we cannot ensure that we have a secure connection.  Are you willing to proceed?" STAFF: Did the patient verbally acknowledge consent to telehealth visit? Document YES/NO here:  Yes    IF USING DOXIMITY or DOXY.ME - The patient will receive a link just prior to their visit by text.     FULL LENGTH CONSENT FOR TELE-HEALTH VISIT   I hereby voluntarily request, consent and authorize [facility] and its employed or contracted physicians, physician assistants, nurse practitioners or other licensed health care professionals (the Practitioner), to provide me with telemedicine health care services (the Services") as deemed necessary by the treating Practitioner. I acknowledge and consent to receive the Services by the Practitioner via telemedicine. I understand that the telemedicine visit will  involve communicating with the Practitioner through live audiovisual communication technology and the disclosure of certain medical information by electronic transmission. I acknowledge that I have been given the opportunity to request an in-person assessment or other available alternative prior to the telemedicine visit and am voluntarily participating in the telemedicine visit.  I understand that I have the right to withhold or withdraw my consent to the use of telemedicine in the course of my care at any time, without affecting my right to future care or treatment, and that the Practitioner or I may terminate the telemedicine visit at any time. I understand that I have the right to inspect all information obtained and/or recorded in the course of the telemedicine visit and may receive copies of available information for a reasonable fee.  I understand that some of the potential risks of receiving the Services via telemedicine include:   Delay or interruption in medical evaluation due to technological equipment failure or disruption;  Information transmitted may not be sufficient (e.g. poor resolution of images) to allow for appropriate medical decision making by the Practitioner; and/or   In rare instances, security protocols could fail, causing a breach of personal health information.  Furthermore, I acknowledge that it is my responsibility to provide information about my medical history, conditions and care that is complete and accurate to the best of my ability. I acknowledge that Practitioner's advice, recommendations, and/or decision may be based on factors not within their control, such as incomplete or inaccurate data  provided by me or distortions of diagnostic images or specimens that may result from electronic transmissions. I understand that the practice of medicine is not an exact science and that Practitioner makes no warranties or guarantees regarding treatment outcomes. I acknowledge that  I will receive a copy of this consent concurrently upon execution via email to the email address I last provided but may also request a printed copy by calling the office of [facility].    I understand that my insurance will be billed for this visit.   I have read or had this consent read to me.  I understand the contents of this consent, which adequately explains the benefits and risks of the Services being provided via telemedicine.   I have been provided ample opportunity to ask questions regarding this consent and the Services and have had my questions answered to my satisfaction.  I give my informed consent for the services to be provided through the use of telemedicine in my medical care  By participating in this telemedicine visit I agree to the above.

## 2018-12-27 ENCOUNTER — Telehealth (INDEPENDENT_AMBULATORY_CARE_PROVIDER_SITE_OTHER): Payer: Medicare Other | Admitting: Cardiology

## 2018-12-27 ENCOUNTER — Encounter: Payer: Self-pay | Admitting: Cardiology

## 2018-12-27 VITALS — BP 141/82 | HR 61 | Wt 198.0 lb

## 2018-12-27 DIAGNOSIS — I2510 Atherosclerotic heart disease of native coronary artery without angina pectoris: Secondary | ICD-10-CM | POA: Diagnosis not present

## 2018-12-27 DIAGNOSIS — I10 Essential (primary) hypertension: Secondary | ICD-10-CM

## 2018-12-27 DIAGNOSIS — I714 Abdominal aortic aneurysm, without rupture, unspecified: Secondary | ICD-10-CM

## 2018-12-27 DIAGNOSIS — I2584 Coronary atherosclerosis due to calcified coronary lesion: Secondary | ICD-10-CM

## 2018-12-27 DIAGNOSIS — E785 Hyperlipidemia, unspecified: Secondary | ICD-10-CM | POA: Diagnosis not present

## 2018-12-27 DIAGNOSIS — I119 Hypertensive heart disease without heart failure: Secondary | ICD-10-CM

## 2018-12-27 DIAGNOSIS — Z7189 Other specified counseling: Secondary | ICD-10-CM

## 2018-12-27 NOTE — Progress Notes (Signed)
Virtual Visit via Video Note   This visit type was conducted due to national recommendations for restrictions regarding the COVID-19 Pandemic (e.g. social distancing) in an effort to limit this patient's exposure and mitigate transmission in our community.  Due to his co-morbid illnesses, this patient is at least at moderate risk for complications without adequate follow up.  This format is felt to be most appropriate for this patient at this time.  All issues noted in this document were discussed and addressed.  A limited physical exam was performed with this format.  Please refer to the patient's chart for his consent to telehealth for [facility].   Date:  12/27/2018   ID:  Jeffrey [name], DOB 1940-09-30, MRN [id number]  Patient Location: Home Provider Location: Home  PCP:  Vivi Barrack, MD  Cardiologist:  Buford Dresser, MD  Electrophysiologist:  None   Evaluation Performed:  Follow-Up Visit  Chief Complaint:  Follow up  History of Present Illness:    Jeffrey [name] is a 78 y.o. 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 Trula Slade), HLD, HTN, prior tobacco abuse who is seen for follow up at the request of Dr. Trula Slade for discussion of cardiac findings on his CTA.  The patient does not have symptoms concerning for COVID-19 infection (fever, chills, cough, or new shortness of breath).   Today: Doing well, staying safe from coronavirus. Has recovered well post surgery, except for joint pain (see below)  Reviewed his recent CTA with him. Similar to prior imaging, he has calcification in coronary arteries and aorta. He also asked about prior thoracic AA of 4.1 cm noted previously, showed images to him via screenshare, on my measurements has not enlarged.  No chest pain, is able to walk every day without issues. Does some calisthenics/pushups. He is limited by joint pain and not cardiac symptoms   Had been taking ibuprofen, stopped after our  visit in 01/2018. However, his joint pain is much more difficult to manage without it. He tried tylenol but it did not work well. He was walking 3 miles/day but now has severe joint pain after 1/2-1 mile. Not interested in opiate pain meds.   BP: This AM, had been rushing to get his machine, was higher than it had been. Hasn't checked in a long time since this AM, but last time he checked it was 120/70.   Denies chest pain, shortness of breath at rest or with normal exertion. No PND, orthopnea, LE edema or unexpected weight gain. No syncope or palpitations.  Past Medical History:  Diagnosis Date   AAA (abdominal aortic aneurysm) (HCC)    Allergy    Arthritis    Colon polyps    Coronary artery calcification    Eczematous dermatitis    GERD (gastroesophageal reflux disease)    Hypertension    Iliac artery stenosis, left (HCC)    Osteopenia    Past Surgical History:  Procedure Laterality Date   ABDOMINAL AORTIC ENDOVASCULAR FENESTRATED STENT GRAFT N/A 03/21/2018   Procedure: ABDOMINAL AORTIC ENDOVASCULAR FENESTRATED STENT GRAFT ; DYNA CT PERFORMED;  Surgeon: [name], MD;  Location: [facility] OR;  Service: Vascular;  Laterality: N/A;   Basal Skin Cancer  2012   CATARACT EXTRACTION Bilateral    TONSILLECTOMY     TRANSURETHRAL RESECTION OF PROSTATE       Current Meds  Medication Sig   acetaminophen (TYLENOL) 500 MG tablet Take 1,000 mg by mouth every 6 (six) hours  as needed for moderate pain or headache.   alendronate (FOSAMAX) 70 MG tablet TAKE 1 TABLET BY MOUTH WEEKLY   Ascorbic Acid (VITAMIN C) 1000 MG tablet Take 1,000 mg by mouth daily.    aspirin EC 81 MG tablet Take 81 mg by mouth daily.    atorvastatin (LIPITOR) 40 MG tablet Take 1 tablet (40 mg total) by mouth daily at 6 PM.   Calcium Carbonate-Vitamin D (CALCIUM 600+D) 600-200 MG-UNIT TABS Take 1 tablet by mouth every other day.    Cyanocobalamin (RA VITAMIN B-12 TR) 1000 MCG TBCR Take 1,000 mcg by mouth  daily.    Emollient (CERAVE) LOTN Apply 1 application topically daily as needed (after shower care).    ferrous sulfate 325 (65 FE) MG tablet Take 325 mg by mouth every Wednesday.   fexofenadine (ALLEGRA) 180 MG tablet Take 180 mg by mouth daily as needed for allergies.    fluticasone (FLONASE) 50 MCG/ACT nasal spray Place 1 spray into both nostrils every other day.   GLUCOSAMINE-CHONDROITIN PO Take 1 tablet by mouth 2 (two) times daily.    ibuprofen (ADVIL,MOTRIN) 200 MG tablet Take 200 mg by mouth every 6 (six) hours as needed for headache or moderate pain.   losartan (COZAAR) 25 MG tablet TAKE 1 TABLET(25 MG) BY MOUTH TWICE DAILY   Multiple Vitamins-Minerals (CENTRUM ADULTS PO) Take 1 tablet by mouth daily.    Omega-3 1000 MG CAPS Take 1,000-2,000 mg by mouth See admin instructions. Take 1000 mg by mouth in the morning and take 2000 mg by mouth in the evening after supper   [DISCONTINUED] atorvastatin (LIPITOR) 20 MG tablet TAKE ONE TABLET BY MOUTH AT 6PM     Allergies:   Ketotifen fumarate and Triamcinolone   Social History   Tobacco Use   Smoking status: Former Smoker    Packs/day: 1.00    Years: 32.00    Pack years: 32.00    Types: Cigarettes    Quit date: 08/17/1990    Years since quitting: 28.3   Smokeless tobacco: Never Used  Substance Use Topics   Alcohol use: Yes    Comment: Occassionally, "when I feel like it"   Drug use: No     Family Hx: The patient's family history includes Alcohol abuse in his father; Cancer in his sister; Diabetes in his mother; Heart attack in his father; Heart disease in his father; Hyperlipidemia in his sister; Hypertension in his father, mother, and son; Pancreatic cancer in his brother and sister; Stroke in his mother.  ROS:   Please see the history of present illness.    Constitutional: Negative for chills, fever, night sweats, unintentional weight loss  HENT: Negative for ear pain and hearing loss.   Eyes: Negative for loss  of vision and eye pain.  Respiratory: Negative for cough, sputum, wheezing.   Cardiovascular: See HPI. Gastrointestinal: Negative for abdominal pain, melena, and hematochezia.  Genitourinary: Negative for dysuria and hematuria.  Musculoskeletal: Negative for falls and myalgias. Positive for joint pain. Skin: Negative for itching and rash.  Neurological: Negative for focal weakness, focal sensory changes and loss of consciousness.  Endo/Heme/Allergies: Does not bruise/bleed easily.  All other systems reviewed and are negative.   Prior CV studies:   The following studies were reviewed today: CTA 01-04-2019 (cardiac only section)  Cardiovascular: Heart size is normal. There is no significant pericardial fluid, thickening or pericardial calcification. There is aortic atherosclerosis, as well as atherosclerosis of the great vessels of the mediastinum and the coronary  arteries, including calcified atherosclerotic plaque in the left main and left anterior descending coronary arteries.  Labs/Other Tests and Data Reviewed:    EKG:  An ECG dated 02/02/18 was personally reviewed today and demonstrated:  sinus brady  Recent Labs: 03/14/2018: ALT 19 03/22/2018: BUN 15; Creatinine, Ser 1.07; Hemoglobin 9.8; Platelets 114; Potassium 3.9; Sodium 134   Recent Lipid Panel Lab Results  Component Value Date/Time   CHOL 117 03/14/2018 11:50 AM   TRIG 71.0 03/14/2018 11:50 AM   HDL 57.00 03/14/2018 11:50 AM   CHOLHDL 2 03/14/2018 11:50 AM   LDLCALC 46 03/14/2018 11:50 AM   LDLCALC 46 02/24/2017 02:45 PM    Wt Readings from Last 3 Encounters:  12/27/18 198 lb (89.8 kg)  04/23/18 207 lb 4.8 oz (94 kg)  03/21/18 200 lb (90.7 kg)     Objective:    Vital Signs:  BP (!) 141/82    Pulse 61    Wt 198 lb (89.8 kg)    BMI 28.41 kg/m    VITAL SIGNS:  reviewed GEN:  no acute distress EYES:  sclerae anicteric, EOMI - Extraocular Movements Intact RESPIRATORY:  normal respiratory effort, symmetric  expansion CARDIOVASCULAR:  no visible JVD SKIN:  no rash, lesions or ulcers. MUSCULOSKELETAL:  no obvious deformities. NEURO:  alert and oriented x 3, no obvious focal deficit PSYCH:  normal affect  ASSESSMENT & PLAN:    CAD based on calcium from imaging: reviewed pictures with him, including screen sharing and pointing out imaging. Asymptomatic. -goal is aggressive prevention. He is doing well with this, on aspirin and high intensity statin -discussed at length that coronary stents (in a non-acute setting) only treat symptoms, do not prevent MI or prolong life. As he is not currently having symptoms, no indication for invasive testing.  -counseled on red flag symptoms that need immediate medical attention -no beta blocker, resting heart rate usually in the 50s on no meds, even after rushing to get BP cuff was only 61. -counseled on recommendations re: NSAIDs and CAD. His pain is limiting his activity level. He does not get relief from tylenol, has refused narcotics for pain in the past. Recommended he discuss if there are other available pain medication options. Ultimately it is up to him if he wants to stay off NSAIDs, but he is aware of risks if he does take ibuprofen intermittently. -we will plan to follow up yearly for aggressive secondary prevention. Counseled to call and I am happy to see him sooner with any concerns.  Hypertension: has not checked on home BP cuff recently, but had been 120/70 before. Counseled him on how to properly take. He will keep a log and follow up with Dr. [name] at his upcoming appt. -continue losartan 25 mg daily, can uptitrate if needed  Hyperlipidemia: -continue atorvastatin 40 mg -last lipids show excellent control, LDL 46  History of AAA s/p FEVAR and renal stenting -follows with Dr. Trula Slade  Secondary prevention -recommend heart healthy/Mediterranean diet, with whole grains, fruits, vegetable, fish, lean meats, nuts, and olive oil. Limit  salt. -recommend moderate walking, 3-5 times/week for 30-50 minutes each session. Aim for at least 150 minutes.week. Goal should be pace of 3 miles/hours, or walking 1.5 miles in 30 minutes -recommend avoidance of tobacco products. Avoid excess alcohol.  COVID-19 Education: The signs and symptoms of COVID-19 were discussed with the patient and how to seek care for testing (follow up with PCP or arrange E-visit).  The importance of social distancing was discussed  today.  Time:   Today, I have spent 41 minutes with the patient with telehealth technology discussing the above problems.     Medication Adjustments/Labs and Tests Ordered: Current medicines are reviewed at length with the patient today.  Concerns regarding medicines are outlined above.   Patient Instructions  Medication Instructions:  Your Physician recommend you continue on your current medication as directed.    If you need a refill on your cardiac medications before your next appointment, please call your pharmacy.   Lab work: None  Testing/Procedures: None  Follow-Up: At [facility], you and your health needs are our priority.  As part of our continuing mission to provide you with exceptional heart care, we have created designated Provider Care Teams.  These Care Teams include your primary Cardiologist (physician) and Advanced Practice Providers (APPs -  Physician Assistants and Nurse Practitioners) who all work together to provide you with the care you need, when you need it. You will need a follow up appointment in 1 years.  Please call our office 2 months in advance to schedule this appointment.  You may see Buford Dresser, MD or one of the following Advanced Practice Providers on your designated Care Team:   Rosaria Ferries, PA-C  Jory Sims, DNP, ANP      Signed, Buford Dresser, MD  12/27/2018 8:31 PM    [place]

## 2018-12-27 NOTE — Patient Instructions (Signed)

## 2019-01-18 ENCOUNTER — Other Ambulatory Visit: Payer: Self-pay | Admitting: Cardiology

## 2019-01-31 ENCOUNTER — Encounter: Payer: Self-pay | Admitting: Family Medicine

## 2019-03-05 ENCOUNTER — Ambulatory Visit: Payer: Medicare Other | Admitting: Family Medicine

## 2019-03-05 ENCOUNTER — Other Ambulatory Visit: Payer: Self-pay

## 2019-03-05 ENCOUNTER — Ambulatory Visit (INDEPENDENT_AMBULATORY_CARE_PROVIDER_SITE_OTHER): Payer: Medicare Other

## 2019-03-05 VITALS — BP 132/68 | Temp 97.4°F | Ht 70.0 in | Wt 201.2 lb

## 2019-03-05 DIAGNOSIS — Z0000 Encounter for general adult medical examination without abnormal findings: Secondary | ICD-10-CM | POA: Diagnosis not present

## 2019-03-05 NOTE — Progress Notes (Signed)
Subjective:   Jeffrey [name] is a 78 y.o. male who presents for Medicare Annual/Subsequent preventive examination.  Review of Systems:   Cardiac Risk Factors include: advanced age (>57men, >15 women);male gender;hypertension     Objective:    Vitals: BP 132/68 (BP Location: Left Arm, Patient Position: Sitting, Cuff Size: Normal)   Temp (!) 97.4 F (36.3 C) (Temporal)   Ht 5\' 10"  (1.778 m)   Wt 201 lb 3.2 oz (91.3 kg)   BMI 28.87 kg/m   Body mass index is 28.87 kg/m.  Advanced Directives 03/05/2019 12/24/2018 04/23/2018 03/21/2018 03/13/2018 02/28/2018 12/26/2017  Does Patient Have a Medical Advance Directive? No No No No No No No  Would patient like information on creating a medical advance directive? Yes (MAU/Ambulatory/Procedural Areas - Information given) No - Patient declined No - Patient declined No - Patient declined No - Patient declined Yes (MAU/Ambulatory/Procedural Areas - Information given) -    Tobacco Social History   Tobacco Use  Smoking Status Former Smoker  . Packs/day: 1.00  . Years: 32.00  . Pack years: 32.00  . Types: Cigarettes  . Quit date: 08/17/1990  . Years since quitting: 28.5  Smokeless Tobacco Never Used     Counseling given: Not Answered   Clinical Intake:  Pre-visit preparation completed: Yes  Pain : No/denies pain     Diabetes: No  How often do you need to have someone help you when you read instructions, pamphlets, or other written materials from your doctor or pharmacy?: 1 - Never  Interpreter Needed?: No  Information entered by :: Denman George LPN  Past Medical History:  Diagnosis Date  . AAA (abdominal aortic aneurysm) ([place])   . Allergy   . Arthritis   . Colon polyps   . Coronary artery calcification   . Eczematous dermatitis   . GERD (gastroesophageal reflux disease)   . Hypertension   . Iliac artery stenosis, left ([place])   . Osteopenia    Past Surgical History:  Procedure Laterality Date  . ABDOMINAL AORTIC  ENDOVASCULAR FENESTRATED STENT GRAFT N/A 03/21/2018   Procedure: ABDOMINAL AORTIC ENDOVASCULAR FENESTRATED STENT GRAFT ; [place] [facility] PERFORMED;  Surgeon: [name], MD;  Location: [facility];  Service: Vascular;  Laterality: N/A;  . Basal Skin Cancer  2012  . CATARACT EXTRACTION Bilateral   . TONSILLECTOMY    . TRANSURETHRAL RESECTION OF PROSTATE     Family History  Problem Relation Age of Onset  . Diabetes Mother   . Hypertension Mother   . Stroke Mother   . Alcohol abuse Father   . Heart attack Father   . Heart disease Father   . Hypertension Father   . Cancer Sister   . Hyperlipidemia Sister   . Pancreatic cancer Sister   . Pancreatic cancer Brother   . Hypertension Son    Social History   Socioeconomic History  . Marital status: Divorced    Spouse name: Not on file  . Number of children: 1  . Years of education: Not on file  . Highest education level: Not on file  Occupational History  . Occupation: Retired    Fish farm manager: [employer]  . Financial resource strain: Not on file  . Food insecurity    Worry: Not on file    Inability: Not on file  . Transportation needs    Medical: Not on file    Non-medical: Not on file  Tobacco Use  . Smoking status: Former Smoker  Packs/day: 1.00    Years: 32.00    Pack years: 32.00    Types: Cigarettes    Quit date: 08/17/1990    Years since quitting: 28.5  . Smokeless tobacco: Never Used  Substance and Sexual Activity  . Alcohol use: Yes    Comment: Occassionally, "when I feel like it"  . Drug use: No  . Sexual activity: Yes    Partners: Female  Lifestyle  . Physical activity    Days per week: Not on file    Minutes per session: Not on file  . Stress: Not on file  Relationships  . Social Herbalist on phone: Not on file    Gets together: Not on file    Attends religious service: Not on file    Active member of club or organization: Not on file    Attends meetings of clubs or organizations:  Not on file    Relationship status: Not on file  Other Topics Concern  . Not on file  Social History Narrative  . Not on file    Outpatient Encounter Medications as of 03/05/2019  Medication Sig  . acetaminophen (TYLENOL) 500 MG tablet Take 1,000 mg by mouth every 6 (six) hours as needed for moderate pain or headache.  . alendronate (FOSAMAX) 70 MG tablet TAKE 1 TABLET BY MOUTH WEEKLY  . Ascorbic Acid (VITAMIN C) 1000 MG tablet Take 1,000 mg by mouth daily.   Marland Kitchen aspirin EC 81 MG tablet Take 81 mg by mouth daily.   Marland Kitchen atorvastatin (LIPITOR) 40 MG tablet TAKE 1 TABLET(40 MG) BY MOUTH DAILY AT 6 PM  . Calcium Carbonate-Vitamin D (CALCIUM 600+D) 600-200 MG-UNIT TABS Take 1 tablet by mouth every other day.   . Cyanocobalamin (RA VITAMIN B-12 TR) 1000 MCG TBCR Take 1,000 mcg by mouth daily.   . Emollient (CERAVE) LOTN Apply 1 application topically daily as needed (after shower care).   . ferrous sulfate 325 (65 FE) MG tablet Take 325 mg by mouth every Wednesday.  . fexofenadine (ALLEGRA) 180 MG tablet Take 180 mg by mouth daily as needed for allergies.   . fluticasone (FLONASE) 50 MCG/ACT nasal spray Place 1 spray into both nostrils every other day.  Marland Kitchen GLUCOSAMINE-CHONDROITIN PO Take 1 tablet by mouth 2 (two) times daily.   Marland Kitchen ibuprofen (ADVIL,MOTRIN) 200 MG tablet Take 200 mg by mouth every 6 (six) hours as needed for headache or moderate pain.  Marland Kitchen losartan (COZAAR) 25 MG tablet TAKE 1 TABLET(25 MG) BY MOUTH TWICE DAILY  . Multiple Vitamins-Minerals (CENTRUM ADULTS PO) Take 1 tablet by mouth daily.   . Omega-3 1000 MG CAPS Take 1,000-2,000 mg by mouth See admin instructions. Take 1000 mg by mouth in the morning and take 2000 mg by mouth in the evening after supper   No facility-administered encounter medications on file as of 03/05/2019.     Activities of Daily Living In your present state of health, do you have any difficulty performing the following activities: 03/05/2019 03/21/2018  Hearing? 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 N N  Difficulty concentrating or making decisions? N N  Walking or climbing stairs? N N  Dressing or bathing? N N  Doing errands, shopping? N N  Preparing Food and eating ? N -  Using the Toilet? N -  In the past six months, have you accidently leaked urine? N -  Do you have problems with loss of bowel  control? N -  Managing your Medications? N -  Managing your Finances? N -  Housekeeping or managing your Housekeeping? N -  Some recent data might be hidden    Patient Care Team: Vivi Barrack, MD as PCP - General (Family Medicine) Buford Dresser, MD as PCP - Cardiology (Cardiology) Chevis Pretty as Consulting Physician (Dentistry) [name], MD as Consulting Physician (Vascular Surgery)   Assessment:   This is a routine wellness examination for [facility].  Exercise Activities and Dietary recommendations Current Exercise Habits: The patient does not participate in regular exercise at present;Home exercise routine, Type of exercise: walking, Time (Minutes): 30, Frequency (Times/Week): 5, Weekly Exercise (Minutes/Week): 150, Intensity: Mild  Goals    . Weight (lb) < 200 lb (90.7 kg)       Fall Risk Fall Risk  03/05/2019 02/28/2018 02/27/2017 02/24/2017  Falls in the past year? 0 No Yes -  Number falls in past yr: 0 - 1 1  Injury with Fall? 0 - No No  Follow up Education provided;Falls evaluation completed - Education provided -   Is the patient's home free of loose throw rugs in walkways, pet beds, electrical cords, etc?   yes      Grab bars in the bathroom? yes      Handrails on the stairs?   yes      Adequate lighting?   yes  Timed Get Up and Go Performed: completed and within normal timeframe; no gait abnormalities noted    Depression Screen PHQ 2/9 Scores 03/05/2019 02/28/2018 02/27/2017 02/24/2017  PHQ - 2 Score 0 0 0 0    Cognitive Function MMSE - Mini Mental State Exam 02/27/2017  Orientation to  time 5  Orientation to Place 5  Registration 3  Attention/ Calculation 3  Recall 2  Language- name 2 objects 2  Language- repeat 1  Language- follow 3 step command 3  Language- read & follow direction 1  Write a sentence 1  Copy design 1  Total score 27     6CIT Screen 03/05/2019 02/28/2018  What Year? 0 points 0 points  What month? 0 points 0 points  What time? 0 points 0 points  Count back from 20 0 points 0 points  Months in reverse 0 points 0 points  Repeat phrase 2 points 10 points  Total Score 2 10    Immunization History  Administered Date(s) Administered  . Influenza, High Dose Seasonal PF 04/26/2017, 05/05/2018  . Pneumococcal Conjugate-13 09/24/2014  . Pneumococcal Polysaccharide-23 04/26/2017  . Tdap 05/05/2018    Qualifies for Shingles Vaccine? Discussed and patient will check with pharmacy for coverage.  Patient education handout provided    Screening Tests Health Maintenance  Topic Date Due  . INFLUENZA VACCINE  01/19/2019  . TETANUS/TDAP  05/05/2028  . PNA vac Low Risk Adult  Completed   Cancer Screenings: Lung: Low Dose CT Chest recommended if Age 63-80 years, 30 pack-year currently smoking OR have quit w/in 15years. Patient does not qualify. Colorectal: not indicated     Plan:    I have personally reviewed and addressed the Medicare Annual Wellness questionnaire and have noted the following in the patient's chart:  A. Medical and social history B. Use of alcohol, tobacco or illicit drugs  C. Current medications and supplements D. Functional ability and status E.  Nutritional status F.  Physical activity G. Advance directives H. List of other physicians I.  Hospitalizations, surgeries, and ER visits in previous 12 months J.  Vitals K. Screenings such as hearing and vision if needed, cognitive and depression L. Referrals, records requested, and appointments- none    In addition, I have reviewed and discussed with patient certain preventive  protocols, quality metrics, and best practice recommendations. A written personalized care plan for preventive services as well as general preventive health recommendations were provided to patient.   Signed,  Denman George, LPN  Nurse Health Advisor   Nurse Notes: Patient is currently having problems with vertigo.  States that he has had this before and usually subsides with lying down.  Did state that he vomited this am with episode.  Thinks that he got up from bed to quickly.  He will call back if symptoms do not subside.  Patient education provided on [place] [employer].

## 2019-03-05 NOTE — Progress Notes (Signed)
I have personally reviewed the Medicare Annual Wellness Visit and agree with the assessment and plan.  Algis Greenhouse. [name], MD 03/05/2019 1:07 PM

## 2019-03-05 NOTE — Patient Instructions (Addendum)
Jeffrey [name] , Thank you for taking time to come for your Medicare Wellness Visit. I appreciate your ongoing commitment to your health goals. Please review the following plan we discussed and let me know if I can assist you in the future.   Screening recommendations/referrals: Colorectal Screening: not indicated   Vision and Dental Exams: Recommended annual ophthalmology exams for early detection of glaucoma and other disorders of the eye Recommended annual dental exams for proper oral hygiene  Vaccinations: Influenza vaccine: will get at next visit  Pneumococcal vaccine: up to date; last 04/26/17 Tdap vaccine:up to date; last 05/05/18 Shingles vaccine: Please call your insurance company to determine your out of pocket expense for the Shingrix vaccine. You may receive this vaccine at your local pharmacy.  Advanced directives: Advance directives discussed with you today. I have provided a copy for you to complete at home and have notarized. Once this is complete please bring a copy in to our office so we can scan it into your chart.  Goals: Recommend to try DASH diet and reduce sodium intake   Next appointment: Please schedule your Annual Wellness Visit with your Nurse Health Advisor in one year.  Preventive Care 78 Years and Older, Male Preventive care refers to lifestyle choices and visits with your health care provider that can promote health and wellness. What does preventive care include?  A yearly physical exam. This is also called an annual well check.  Dental exams once or twice a year.  Routine eye exams. Ask your health care provider how often you should have your eyes checked.  Personal lifestyle choices, including:  Daily care of your teeth and gums.  Regular physical activity.  Eating a healthy diet.  Avoiding tobacco and drug use.  Limiting alcohol use.  Practicing safe sex.  Taking low doses of aspirin every day if recommended by your health care provider..   Taking vitamin and mineral supplements as recommended by your health care provider. What happens during an annual well check? The services and screenings done by your health care provider during your annual well check will depend on your age, overall health, lifestyle risk factors, and family history of disease. Counseling  Your health care provider may ask you questions about your:  Alcohol use.  Tobacco use.  Drug use.  Emotional well-being.  Home and relationship well-being.  Sexual activity.  Eating habits.  History of falls.  Memory and ability to understand (cognition).  Work and work Statistician. Screening  You may have the following tests or measurements:  Height, weight, and BMI.  Blood pressure.  Lipid and cholesterol levels. These may be checked every 5 years, or more frequently if you are over 35 years old.  Skin check.  Lung cancer screening. You may have this screening every year starting at age 78 if you have a 30-pack-year history of smoking and currently smoke or have quit within the past 15 years.  Fecal occult blood test (FOBT) of the stool. You may have this test every year starting at age 78.  Flexible sigmoidoscopy or colonoscopy. You may have a sigmoidoscopy every 5 years or a colonoscopy every 10 years starting at age 26.  Prostate cancer screening. Recommendations will vary depending on your family history and other risks.  Hepatitis C blood test.  Hepatitis B blood test.  Sexually transmitted disease (STD) testing.  Diabetes screening. This is done by checking your blood sugar (glucose) after you have not eaten for a while (fasting). You may have  this done every 1-3 years.  Abdominal aortic aneurysm (AAA) screening. You may need this if you are a current or former smoker.  Osteoporosis. You may be screened starting at age 48 if you are at high risk. Talk with your health care provider about your test results, treatment options, and if  necessary, the need for more tests. Vaccines  Your health care provider may recommend certain vaccines, such as:  Influenza vaccine. This is recommended every year.  Tetanus, diphtheria, and acellular pertussis (Tdap, Td) vaccine. You may need a Td booster every 10 years.  Zoster vaccine. You may need this after age 83.  Pneumococcal 13-valent conjugate (PCV13) vaccine. One dose is recommended after age 78.  Pneumococcal polysaccharide (PPSV23) vaccine. One dose is recommended after age 78. Talk to your health care provider about which screenings and vaccines you need and how often you need them. This information is not intended to replace advice given to you by your health care provider. Make sure you discuss any questions you have with your health care provider. Document Released: 07/03/2015 Document Revised: 02/24/2016 Document Reviewed: 04/07/2015 Elsevier Interactive Patient Education  2017 [place] Prevention in the Home Falls can cause injuries. They can happen to people of all ages. There are many things you can do to make your home safe and to help prevent falls. What can I do on the outside of my home?  Regularly fix the edges of walkways and driveways and fix any cracks.  Remove anything that might make you trip as you walk through a door, such as a raised step or threshold.  Trim any bushes or trees on the path to your home.  Use bright outdoor lighting.  Clear any walking paths of anything that might make someone trip, such as rocks or tools.  Regularly check to see if handrails are loose or broken. Make sure that both sides of any steps have handrails.  Any raised decks and porches should have guardrails on the edges.  Have any leaves, snow, or ice cleared regularly.  Use sand or salt on walking paths during winter.  Clean up any spills in your garage right away. This includes oil or grease spills. What can I do in the bathroom?  Use night lights.   Install grab bars by the toilet and in the tub and shower. Do not use towel bars as grab bars.  Use non-skid mats or decals in the tub or shower.  If you need to sit down in the shower, use a plastic, non-slip stool.  Keep the floor dry. Clean up any water that spills on the floor as soon as it happens.  Remove soap buildup in the tub or shower regularly.  Attach bath mats securely with double-sided non-slip rug tape.  Do not have throw rugs and other things on the floor that can make you trip. What can I do in the bedroom?  Use night lights.  Make sure that you have a light by your bed that is easy to reach.  Do not use any sheets or blankets that are too big for your bed. They should not hang down onto the floor.  Have a firm chair that has side arms. You can use this for support while you get dressed.  Do not have throw rugs and other things on the floor that can make you trip. What can I do in the kitchen?  Clean up any spills right away.  Avoid walking on wet floors.  Keep items that you use a lot in easy-to-reach places.  If you need to reach something above you, use a strong step stool that has a grab bar.  Keep electrical cords out of the way.  Do not use floor polish or wax that makes floors slippery. If you must use wax, use non-skid floor wax.  Do not have throw rugs and other things on the floor that can make you trip. What can I do with my stairs?  Do not leave any items on the stairs.  Make sure that there are handrails on both sides of the stairs and use them. Fix handrails that are broken or loose. Make sure that handrails are as long as the stairways.  Check any carpeting to make sure that it is firmly attached to the stairs. Fix any carpet that is loose or worn.  Avoid having throw rugs at the top or bottom of the stairs. If you do have throw rugs, attach them to the floor with carpet tape.  Make sure that you have a light switch at the top of the  stairs and the bottom of the stairs. If you do not have them, ask someone to add them for you. What else can I do to help prevent falls?  Wear shoes that:  Do not have high heels.  Have rubber bottoms.  Are comfortable and fit you well.  Are closed at the toe. Do not wear sandals.  If you use a stepladder:  Make sure that it is fully opened. Do not climb a closed stepladder.  Make sure that both sides of the stepladder are locked into place.  Ask someone to hold it for you, if possible.  Clearly mark and make sure that you can see:  Any grab bars or handrails.  First and last steps.  Where the edge of each step is.  Use tools that help you move around (mobility aids) if they are needed. These include:  Canes.  Walkers.  Scooters.  Crutches.  Turn on the lights when you go into a dark area. Replace any light bulbs as soon as they burn out.  Set up your furniture so you have a clear path. Avoid moving your furniture around.  If any of your floors are uneven, fix them.  If there are any pets around you, be aware of where they are.  Review your medicines with your doctor. Some medicines can make you feel dizzy. This can increase your chance of falling. Ask your doctor what other things that you can do to help prevent falls. This information is not intended to replace advice given to you by your health care provider. Make sure you discuss any questions you have with your health care provider. Document Released: 04/02/2009 Document Revised: 11/12/2015 Document Reviewed: 07/11/2014 Elsevier Interactive Patient Education  2017 [employer].

## 2019-03-08 ENCOUNTER — Other Ambulatory Visit: Payer: Self-pay | Admitting: Family Medicine

## 2019-03-10 ENCOUNTER — Encounter: Payer: Self-pay | Admitting: Family Medicine

## 2019-03-18 ENCOUNTER — Encounter: Payer: Medicare Other | Admitting: Family Medicine

## 2019-03-26 ENCOUNTER — Ambulatory Visit (INDEPENDENT_AMBULATORY_CARE_PROVIDER_SITE_OTHER): Payer: Medicare Other | Admitting: Family Medicine

## 2019-03-26 ENCOUNTER — Encounter: Payer: Self-pay | Admitting: Family Medicine

## 2019-03-26 ENCOUNTER — Other Ambulatory Visit: Payer: Self-pay

## 2019-03-26 VITALS — BP 175/82 | HR 58 | Temp 98.5°F | Ht 70.0 in | Wt 199.2 lb

## 2019-03-26 DIAGNOSIS — Z23 Encounter for immunization: Secondary | ICD-10-CM | POA: Diagnosis not present

## 2019-03-26 DIAGNOSIS — L570 Actinic keratosis: Secondary | ICD-10-CM | POA: Diagnosis not present

## 2019-03-26 DIAGNOSIS — H9192 Unspecified hearing loss, left ear: Secondary | ICD-10-CM

## 2019-03-26 DIAGNOSIS — Z6828 Body mass index (BMI) 28.0-28.9, adult: Secondary | ICD-10-CM

## 2019-03-26 DIAGNOSIS — R42 Dizziness and giddiness: Secondary | ICD-10-CM

## 2019-03-26 DIAGNOSIS — I714 Abdominal aortic aneurysm, without rupture, unspecified: Secondary | ICD-10-CM

## 2019-03-26 DIAGNOSIS — M8580 Other specified disorders of bone density and structure, unspecified site: Secondary | ICD-10-CM

## 2019-03-26 DIAGNOSIS — E785 Hyperlipidemia, unspecified: Secondary | ICD-10-CM

## 2019-03-26 DIAGNOSIS — Z0001 Encounter for general adult medical examination with abnormal findings: Secondary | ICD-10-CM | POA: Diagnosis not present

## 2019-03-26 DIAGNOSIS — R739 Hyperglycemia, unspecified: Secondary | ICD-10-CM

## 2019-03-26 DIAGNOSIS — E663 Overweight: Secondary | ICD-10-CM

## 2019-03-26 DIAGNOSIS — I10 Essential (primary) hypertension: Secondary | ICD-10-CM

## 2019-03-26 DIAGNOSIS — M79605 Pain in left leg: Secondary | ICD-10-CM | POA: Diagnosis not present

## 2019-03-26 LAB — CBC
HCT: 35.9 % — ABNORMAL LOW (ref 39.0–52.0)
Hemoglobin: 12.2 g/dL — ABNORMAL LOW (ref 13.0–17.0)
MCHC: 34 g/dL (ref 30.0–36.0)
MCV: 94.6 fl (ref 78.0–100.0)
Platelets: 206 10*3/uL (ref 150.0–400.0)
RBC: 3.8 Mil/uL — ABNORMAL LOW (ref 4.22–5.81)
RDW: 14.5 % (ref 11.5–15.5)
WBC: 7.1 10*3/uL (ref 4.0–10.5)

## 2019-03-26 LAB — COMPREHENSIVE METABOLIC PANEL
ALT: 15 U/L (ref 0–53)
AST: 18 U/L (ref 0–37)
Albumin: 4.5 g/dL (ref 3.5–5.2)
Alkaline Phosphatase: 91 U/L (ref 39–117)
BUN: 13 mg/dL (ref 6–23)
CO2: 26 mEq/L (ref 19–32)
Calcium: 9.7 mg/dL (ref 8.4–10.5)
Chloride: 98 mEq/L (ref 96–112)
Creatinine, Ser: 1 mg/dL (ref 0.40–1.50)
GFR: 72.24 mL/min (ref >60.00)
Glucose, Bld: 93 mg/dL (ref 70–99)
Potassium: 4.3 mEq/L (ref 3.5–5.1)
Sodium: 133 mEq/L — ABNORMAL LOW (ref 135–145)
Total Bilirubin: 0.8 mg/dL (ref 0.2–1.2)
Total Protein: 7.7 g/dL (ref 6.0–8.3)

## 2019-03-26 LAB — HEMOGLOBIN A1C: Hgb A1c MFr Bld: 5.2 % (ref 4.6–6.5)

## 2019-03-26 LAB — TSH: TSH: 1.5 u[IU]/mL (ref 0.35–4.50)

## 2019-03-26 LAB — LIPID PANEL
Cholesterol: 116 mg/dL (ref 0–200)
HDL: 53.9 mg/dL (ref >39.00)
LDL Cholesterol: 50 mg/dL (ref 0–99)
NonHDL: 62.54
Total CHOL/HDL Ratio: 2
Triglycerides: 62 mg/dL (ref 0.0–149.0)
VLDL: 12.4 mg/dL (ref 0.0–40.0)

## 2019-03-26 NOTE — Patient Instructions (Signed)
It was very nice to see you today!  We froze the spots on your face, legs, and arms today.  We will check blood work today.  We will give you a flu vaccine today.  Please follow-up with Dr. Trula Slade soon regarding your leg.  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  Please schedule your bone density scan soon.   Come back see me in 1 year for your next physical, or sooner if needed  Take care, Dr [name]  Please try these tips to maintain a healthy lifestyle:   Eat at least 3 REAL meals and 1-2 snacks per day.  Aim for no more than 5 hours between eating.  If you eat breakfast, please do so within one hour of getting up.    Obtain twice as many fruits/vegetables as protein or carbohydrate foods for both lunch and dinner. (Half of each meal should be fruits/vegetables, one quarter protein, and one quarter starchy carbs)   Cut down on sweet beverages. This includes juice, soda, and sweet tea.    Exercise at least 150 minutes every week.    Preventive Care 3 Years and Older, Male Preventive care refers to lifestyle choices and visits with your health care provider that can promote health and wellness. This includes:  A yearly physical exam. This is also called an annual well check.  Regular dental and eye exams.  Immunizations.  Screening for certain conditions.  Healthy lifestyle choices, such as diet and exercise. What can I expect for my preventive care visit? Physical exam Your health care provider will check:  Height and weight. These may be used to calculate body mass index (BMI), which is a measurement that tells if you are at a healthy weight.  Heart rate and blood pressure.  Your skin for abnormal spots. Counseling Your health care provider may ask you questions about:  Alcohol, tobacco, and drug use.  Emotional well-being.  Home and relationship well-being.  Sexual activity.   Eating habits.  History of falls.  Memory and ability to understand (cognition).  Work and work Statistician. What immunizations do I need?  Influenza (flu) vaccine  This is recommended every year. Tetanus, diphtheria, and pertussis (Tdap) vaccine  You may need a Td booster every 10 years. Varicella (chickenpox) vaccine  You may need this vaccine if you have not already been vaccinated. Zoster (shingles) vaccine  You may need this after age 24. Pneumococcal conjugate (PCV13) vaccine  One dose is recommended after age 98. Pneumococcal polysaccharide (PPSV23) vaccine  One dose is recommended after age 24. Measles, mumps, and rubella (MMR) vaccine  You may need at least one dose of MMR if you were born in 1957 or later. You may also need a second dose. Meningococcal conjugate (MenACWY) vaccine  You may need this if you have certain conditions. Hepatitis A vaccine  You may need this if you have certain conditions or if you travel or work in places where you may be exposed to hepatitis A. Hepatitis B vaccine  You may need this if you have certain conditions or if you travel or work in places where you may be exposed to hepatitis B. Haemophilus influenzae type b (Hib) vaccine  You may need this if you have certain conditions. You may receive vaccines as individual doses or as more than one vaccine together in one shot (combination vaccines). Talk with your health care provider about the risks  and benefits of combination vaccines. What tests do I need? Blood tests  Lipid and cholesterol levels. These may be checked every 5 years, or more frequently depending on your overall health.  Hepatitis C test.  Hepatitis B test. Screening  Lung cancer screening. You may have this screening every year starting at age 55 if you have a 30-pack-year history of smoking and currently smoke or have quit within the past 15 years.  Colorectal cancer screening. All adults should have this  screening starting at age 26 and continuing until age 6. Your health care provider may recommend screening at age 40 if you are at increased risk. You will have tests every 1-10 years, depending on your results and the type of screening test.  Prostate cancer screening. Recommendations will vary depending on your family history and other risks.  Diabetes screening. This is done by checking your blood sugar (glucose) after you have not eaten for a while (fasting). You may have this done every 1-3 years.  Abdominal aortic aneurysm (AAA) screening. You may need this if you are a current or former smoker.  Sexually transmitted disease (STD) testing. Follow these instructions at home: Eating and drinking  Eat a diet that includes fresh fruits and vegetables, whole grains, lean protein, and low-fat dairy products. Limit your intake of foods with high amounts of sugar, saturated fats, and salt.  Take vitamin and mineral supplements as recommended by your health care provider.  Do not drink alcohol if your health care provider tells you not to drink.  If you drink alcohol: ? Limit how much you have to 0-2 drinks a day. ? Be aware of how much alcohol is in your drink. In the [place] one drink equals one 12 oz bottle of beer (355 mL), one 5 oz glass of wine (148 mL), or one 1 oz glass of hard liquor (44 mL). Lifestyle  Take daily care of your teeth and gums.  Stay active. Exercise for at least 30 minutes on 5 or more days each week.  Do not use any products that contain nicotine or tobacco, such as cigarettes, e-cigarettes, and chewing tobacco. If you need help quitting, ask your health care provider.  If you are sexually active, practice safe sex. Use a condom or other form of protection to prevent STIs (sexually transmitted infections).  Talk with your health care provider about taking a low-dose aspirin or statin. What's next?  Visit your health care provider once a year for a well check  visit.  Ask your health care provider how often you should have your eyes and teeth checked.  Stay up to date on all vaccines. This information is not intended to replace advice given to you by your health care provider. Make sure you discuss any questions you have with your health care provider. Document Released: 07/03/2015 Document Revised: 05/31/2018 Document Reviewed: 05/31/2018 Elsevier Patient Education  2020 [employer].

## 2019-03-26 NOTE — Assessment & Plan Note (Signed)
Repeat DEXA scan.  Depending on results, will likely be able to stop Fosamax as he has been on this for at least 7 to 8 years.

## 2019-03-26 NOTE — Assessment & Plan Note (Signed)
Continue Lipitor 40 mg daily.  Check lipid panel.

## 2019-03-26 NOTE — Progress Notes (Signed)
Chief Complaint:  Jeffrey [name] is a 78 y.o. male who presents today for his annual comprehensive physical exam.    Assessment/Plan:  HLD (hyperlipidemia) Continue Lipitor 40 mg daily.  Check lipid panel.  Essential hypertension Above goal though home readings typically at goal.  We will continue losartan 25 mg twice daily.  Continue home blood pressure monitoring with goal 140/90 or lower.  Abdominal aortic aneurysm (AAA) without rupture ([place]) Continue management per vascular surgery.  Osteopenia Repeat DEXA scan.  Depending on results, will likely be able to stop Fosamax as he has been on this for at least 7 to 8 years.  Hyperglycemia Check A1c.   Vertigo / Hearing Loss No red flags.  Possible Mnire's disease.  Will place referral to ENT.  Left leg pain History consistent with claudication.  No abnormalities on exam.  Advised him to follow-up with vascular surgery soon.  Actinic keratosis Cryotherapy applied today.  See below procedure note.  Body mass index is 28.59 kg/m. / Overweight    Preventative Healthcare: Due for colon cancer screening at age 38.  Will get flu vaccine today.  Check CBC, C met, TSH, and lipid panel.  Patient Counseling(The following topics were reviewed and/or handout was given):  -Nutrition: Stressed importance of moderation in sodium/caffeine intake, saturated fat and cholesterol, caloric balance, sufficient intake of fresh fruits, vegetables, and fiber.  -Stressed the importance of regular exercise.   -Substance Abuse: Discussed cessation/primary prevention of tobacco, alcohol, or other drug use; driving or other dangerous activities under the influence; availability of treatment for abuse.   -Injury prevention: Discussed safety belts, safety helmets, smoke detector, smoking near bedding or upholstery.   -Sexuality: Discussed sexually transmitted diseases, partner selection, use of condoms, avoidance of unintended pregnancy and contraceptive  alternatives.   -Dental health: Discussed importance of regular tooth brushing, flossing, and dental visits.  -Health maintenance and immunizations reviewed. Please refer to Health maintenance section.  Return to care in 1 year for next preventative visit.     Subjective:  HPI:  Patient has a few new complaints.  He has been having some intermittent vertigo issues for the past several months.  They will typically come in waves.  Does not currently have any symptoms.  Describes a room spinning sensation that usually subsides after a few minutes.  He has had some associated hearing loss on the left with this as well.  No weakness or numbness.  Had had some nausea.  No vomiting.  He is also having some left leg pain.  This started occurring about 3 weeks ago.  He notices that when he walks for long distance he will have a burning sensation in his left posterior calf.  This subsides with rest for a few minutes.  Pain is reproducible whenever he walks for long distances.  His stable, chronic medical conditions are outlined below:   #Essential hypertension - On losartan 50 mg daily tolerating well - Home BPs usually in the 130s/80s - ROS: No reported chest pain or shortness of breath  #Dyslipidemia - On atorvastatin 3m daily and tolerating well - ROS: No myalgias   # Osteopenia - On fosamax 764mweekly and tolerating well  Lifestyle Diet: No specific diet. Exercise: Tries to walk regularly.   Depression screen PHQ 2/9 03/05/2019  Decreased Interest 0  Down, Depressed, Hopeless 0  PHQ - 2 Score 0    Health Maintenance Due  Topic Date Due  . INFLUENZA VACCINE  01/19/2019  ROS: Per HPI, otherwise a complete review of systems was negative.   PMH:  The following were reviewed and entered/updated in epic: Past Medical History:  Diagnosis Date  . AAA (abdominal aortic aneurysm) ([place])   . Allergy   . Arthritis   . Colon polyps   . Coronary artery calcification   .  Eczematous dermatitis   . GERD (gastroesophageal reflux disease)   . Hypertension   . Iliac artery stenosis, left ([place])   . Osteopenia    Patient Active Problem List   Diagnosis Date Noted  . Coronary artery disease due to calcified coronary lesion 12/27/2018  . AAA (abdominal aortic aneurysm) ([place]) 03/21/2018  . Osteopenia 02/24/2017  . Abdominal aortic aneurysm (AAA) without rupture ([place]) 07/06/2015  . Basal cell carcinoma 07/06/2015  . Essential hypertension 07/06/2015  . History of colon polyps 07/06/2015  . HLD (hyperlipidemia) 07/06/2015   Past Surgical History:  Procedure Laterality Date  . ABDOMINAL AORTIC ENDOVASCULAR FENESTRATED STENT GRAFT N/A 03/21/2018   Procedure: ABDOMINAL AORTIC ENDOVASCULAR FENESTRATED STENT GRAFT ; [facility] PERFORMED;  Surgeon: [name], MD;  Location: [facility];  Service: Vascular;  Laterality: N/A;  . Basal Skin Cancer  2012  . CATARACT EXTRACTION Bilateral   . TONSILLECTOMY    . TRANSURETHRAL RESECTION OF PROSTATE      Family History  Problem Relation Age of Onset  . Diabetes Mother   . Hypertension Mother   . Stroke Mother   . Alcohol abuse Father   . Heart attack Father   . Heart disease Father   . Hypertension Father   . Cancer Sister   . Hyperlipidemia Sister   . Pancreatic cancer Sister   . Pancreatic cancer Brother   . Hypertension Son     Medications- reviewed and updated Current Outpatient Medications  Medication Sig Dispense Refill  . acetaminophen (TYLENOL) 500 MG tablet Take 1,000 mg by mouth every 6 (six) hours as needed for moderate pain or headache.    . alendronate (FOSAMAX) 70 MG tablet TAKE 1 TABLET BY MOUTH WEEKLY 12 tablet 0  . Ascorbic Acid (VITAMIN C) 1000 MG tablet Take 1,000 mg by mouth daily.     Marland Kitchen aspirin EC 81 MG tablet Take 81 mg by mouth daily.     Marland Kitchen atorvastatin (LIPITOR) 40 MG tablet TAKE 1 TABLET(40 MG) BY MOUTH DAILY AT 6 PM 90 tablet 3  . Calcium Carbonate-Vitamin D (CALCIUM 600+D) 600-200  MG-UNIT TABS Take 1 tablet by mouth every other day.     . Cyanocobalamin (RA VITAMIN B-12 TR) 1000 MCG TBCR Take 1,000 mcg by mouth daily.     . Emollient (CERAVE) LOTN Apply 1 application topically daily as needed (after shower care).     . ferrous sulfate 325 (65 FE) MG tablet Take 325 mg by mouth every Wednesday.    . fexofenadine (ALLEGRA) 180 MG tablet Take 180 mg by mouth daily as needed for allergies.     . fluticasone (FLONASE) 50 MCG/ACT nasal spray Place 1 spray into both nostrils every other day.    Marland Kitchen GLUCOSAMINE-CHONDROITIN PO Take 1 tablet by mouth 2 (two) times daily.     Marland Kitchen ibuprofen (ADVIL,MOTRIN) 200 MG tablet Take 200 mg by mouth every 6 (six) hours as needed for headache or moderate pain.    Marland Kitchen losartan (COZAAR) 25 MG tablet TAKE 1 TABLET(25 MG) BY MOUTH TWICE DAILY 180 tablet 1  . Multiple Vitamins-Minerals (CENTRUM ADULTS PO) Take 1 tablet by mouth daily.     Marland Kitchen  Omega-3 1000 MG CAPS Take 1,000-2,000 mg by mouth See admin instructions. Take 1000 mg by mouth in the morning and take 2000 mg by mouth in the evening after supper     No current facility-administered medications for this visit.     Allergies-reviewed and updated Allergies  Allergen Reactions  . Ketotifen Fumarate Swelling and Other (See Comments)    Eye swelling  . Triamcinolone Hives and Rash    Social History   Socioeconomic History  . Marital status: Divorced    Spouse name: Not on file  . Number of children: 1  . Years of education: Not on file  . Highest education level: Not on file  Occupational History  . Occupation: Retired    Fish farm manager: [employer]  . Financial resource strain: Not on file  . Food insecurity    Worry: Not on file    Inability: Not on file  . Transportation needs    Medical: Not on file    Non-medical: Not on file  Tobacco Use  . Smoking status: Former Smoker    Packs/day: 1.00    Years: 32.00    Pack years: 32.00    Types: Cigarettes    Quit date:  08/17/1990    Years since quitting: 28.6  . Smokeless tobacco: Never Used  Substance and Sexual Activity  . Alcohol use: Yes    Comment: Occassionally, "when I feel like it"  . Drug use: No  . Sexual activity: Yes    Partners: Female  Lifestyle  . Physical activity    Days per week: Not on file    Minutes per session: Not on file  . Stress: Not on file  Relationships  . Social Herbalist on phone: Not on file    Gets together: Not on file    Attends religious service: Not on file    Active member of club or organization: Not on file    Attends meetings of clubs or organizations: Not on file    Relationship status: Not on file  Other Topics Concern  . Not on file  Social History Narrative  . Not on file        Objective:  Physical Exam: BP (!) 175/82   Pulse (!) 58   Temp 98.5 F (36.9 C)   Ht '5\' 10"'  (1.778 m)   Wt 199 lb 4 oz (90.4 kg)   SpO2 99%   BMI 28.59 kg/m   Body mass index is 28.59 kg/m. Wt Readings from Last 3 Encounters:  03/26/19 199 lb 4 oz (90.4 kg)  03/05/19 201 lb 3.2 oz (91.3 kg)  12/27/18 198 lb (89.8 kg)   Gen: NAD, resting comfortably HEENT: TMs normal bilaterally. OP clear. No thyromegaly noted.  CV: RRR with no murmurs appreciated Pulm: NWOB, CTAB with no crackles, wheezes, or rhonchi GI: Normal bowel sounds present. Soft, Nontender, Nondistended. MSK: no edema, cyanosis, or clubbing noted Skin: warm, dry.  Several scattered hyperkeratotic lesions on face, lower extremities, and legs Neuro: CN2-12 grossly intact. Strength 5/5 in upper and lower extremities. Reflexes symmetric and intact bilaterally.  Psych: Normal affect and thought content  Failed hearing test on left.   Cryotherapy Procedure Note  Pre-operative Diagnosis: Actinic keratosis  Locations: Face, arms, legs  Indications: Therapeutic  Procedure Details  Patient informed of risks (permanent scarring, infection, light or dark discoloration, bleeding,  infection, weakness, numbness and recurrence of the lesion) and benefits of the procedure and  verbal informed consent obtained.  The areas described above are treated with liquid nitrogen therapy, frozen until ice ball extended 3 mm beyond lesion, allowed to thaw, and treated again. A total of 5 lesions were treated. The patient tolerated procedure well.  The patient was instructed on post-op care, warned that there may be blister formation, redness and pain. Recommend OTC analgesia as needed for pain.  Condition: Stable  Complications: none.       Algis Greenhouse. [name], MD 03/26/2019 2:05 PM

## 2019-03-26 NOTE — Assessment & Plan Note (Signed)
Above goal though home readings typically at goal.  We will continue losartan 25 mg twice daily.  Continue home blood pressure monitoring with goal 140/90 or lower.

## 2019-03-26 NOTE — Addendum Note (Signed)
Addended by: Loralyn Freshwater on: 03/26/2019 02:09 PM   Modules accepted: Orders

## 2019-03-26 NOTE — Assessment & Plan Note (Signed)
Continue management per vascular surgery. °

## 2019-03-27 DIAGNOSIS — H6123 Impacted cerumen, bilateral: Secondary | ICD-10-CM | POA: Diagnosis not present

## 2019-03-27 DIAGNOSIS — H9313 Tinnitus, bilateral: Secondary | ICD-10-CM | POA: Diagnosis not present

## 2019-03-27 DIAGNOSIS — R42 Dizziness and giddiness: Secondary | ICD-10-CM | POA: Diagnosis not present

## 2019-03-27 DIAGNOSIS — H903 Sensorineural hearing loss, bilateral: Secondary | ICD-10-CM | POA: Diagnosis not present

## 2019-03-28 NOTE — Progress Notes (Signed)
Dr Marigene Ehlers interpretation of your lab work:  Good news! Your blood work is all stable compared to last year. Keep up the good work and we can recheck labs in a year.    If you have any additional questions, please give [place] a call or send [place] a message through [facility] [place].  Take care, Dr [name]

## 2019-03-29 ENCOUNTER — Telehealth: Payer: Self-pay

## 2019-03-29 NOTE — Telephone Encounter (Signed)
Copied from [place] [id number]. Topic: General - Inquiry >> Mar 28, 2019  5:03 PM Percell Belt A wrote: Reason for CRM: pt returned call about labs after 5 .  He stated he an be reached after 10 am tomorrow.

## 2019-03-29 NOTE — Telephone Encounter (Signed)
Patient notified

## 2019-04-03 DIAGNOSIS — H903 Sensorineural hearing loss, bilateral: Secondary | ICD-10-CM | POA: Diagnosis not present

## 2019-04-03 DIAGNOSIS — R42 Dizziness and giddiness: Secondary | ICD-10-CM | POA: Diagnosis not present

## 2019-04-12 DIAGNOSIS — R42 Dizziness and giddiness: Secondary | ICD-10-CM | POA: Diagnosis not present

## 2019-04-24 ENCOUNTER — Other Ambulatory Visit: Payer: Self-pay

## 2019-04-24 MED ORDER — LOSARTAN POTASSIUM 25 MG PO TABS: ORAL_TABLET | ORAL | 1 refills | Status: DC

## 2019-05-15 ENCOUNTER — Encounter: Payer: Self-pay | Admitting: Family Medicine

## 2019-06-27 ENCOUNTER — Other Ambulatory Visit: Payer: Self-pay

## 2019-06-27 DIAGNOSIS — I714 Abdominal aortic aneurysm, without rupture, unspecified: Secondary | ICD-10-CM

## 2019-06-27 DIAGNOSIS — I712 Thoracic aortic aneurysm, without rupture, unspecified: Secondary | ICD-10-CM

## 2019-07-01 ENCOUNTER — Ambulatory Visit: Payer: Medicare Other | Attending: Internal Medicine

## 2019-07-01 DIAGNOSIS — Z23 Encounter for immunization: Secondary | ICD-10-CM

## 2019-07-01 NOTE — Progress Notes (Signed)
   Covid-19 Vaccination Clinic  Name:  Jeffrey [name]    MRN: [id number] DOB: 1940-11-24  07/01/2019  Mr. Duca was observed post Covid-19 immunization for 15 minutes without incidence. He was provided with Vaccine Information Sheet and instruction to access the V-Safe system.   Mr. Whitty was instructed to call 911 with any severe reactions post vaccine: Marland Kitchen Difficulty breathing  . Swelling of your face and throat  . A fast heartbeat  . A bad rash all over your body  . Dizziness and weakness

## 2019-07-10 ENCOUNTER — Other Ambulatory Visit: Payer: Self-pay

## 2019-07-10 ENCOUNTER — Ambulatory Visit
Admission: RE | Admit: 2019-07-10 | Discharge: 2019-07-10 | Disposition: A | Discharge status: Home / Self Care | Payer: Medicare Other | Source: Ambulatory Visit | Attending: Surgery | Admitting: Surgery

## 2019-07-10 DIAGNOSIS — I714 Abdominal aortic aneurysm, without rupture, unspecified: Secondary | ICD-10-CM

## 2019-07-10 DIAGNOSIS — I712 Thoracic aortic aneurysm, without rupture, unspecified: Secondary | ICD-10-CM

## 2019-07-10 DIAGNOSIS — I745 Embolism and thrombosis of iliac artery: Secondary | ICD-10-CM | POA: Diagnosis not present

## 2019-07-10 DIAGNOSIS — I700 Atherosclerosis of aorta: Secondary | ICD-10-CM | POA: Diagnosis not present

## 2019-07-10 MED ORDER — IOPAMIDOL (ISOVUE-370) INJECTION 76%
75.0000 mL | Freq: Once | INTRAVENOUS | Status: AC | PRN
  Administered 2019-07-10: 75 mL via INTRAVENOUS

## 2019-07-12 ENCOUNTER — Other Ambulatory Visit: Payer: Self-pay

## 2019-07-12 ENCOUNTER — Telehealth (HOSPITAL_COMMUNITY): Payer: Self-pay

## 2019-07-12 DIAGNOSIS — I739 Peripheral vascular disease, unspecified: Secondary | ICD-10-CM

## 2019-07-12 NOTE — Telephone Encounter (Signed)
The above patient or their representative was contacted and gave the following answers to these questions:         Do you have any of the following symptoms?    NO  Fever                    Cough                   Shortness of breath  Do  you have any of the following other symptoms?    muscle pain         vomiting,        diarrhea        rash         weakness        red eye        abdominal pain         bruising          bruising or bleeding              joint pain           severe headache    Have you been in contact with someone who was or has been sick in the past 2 weeks?  NO  Yes                 Unsure                         Unable to assess   Does the person that you were in contact with have any of the following symptoms?   Cough         shortness of breath           muscle pain         vomiting,            diarrhea            rash            weakness           fever            red eye           abdominal pain           bruising  or  bleeding                joint pain                severe headache                 COMMENTS OR ACTION PLAN FOR THIS PATIENT:        ALL QUESTIONS WERE ANSWERED/PT HAS HAD 1ST INJECTION/[facility]

## 2019-07-15 ENCOUNTER — Encounter: Payer: Self-pay | Admitting: Surgery

## 2019-07-15 ENCOUNTER — Ambulatory Visit (HOSPITAL_COMMUNITY)
Admission: RE | Admit: 2019-07-15 | Discharge: 2019-07-15 | Disposition: A | Discharge status: Home / Self Care | Payer: Medicare Other | Source: Ambulatory Visit | Attending: Surgery | Admitting: Surgery

## 2019-07-15 ENCOUNTER — Ambulatory Visit (INDEPENDENT_AMBULATORY_CARE_PROVIDER_SITE_OTHER): Payer: Medicare Other | Admitting: Surgery

## 2019-07-15 ENCOUNTER — Other Ambulatory Visit: Payer: Self-pay

## 2019-07-15 VITALS — Wt 199.0 lb

## 2019-07-15 DIAGNOSIS — I739 Peripheral vascular disease, unspecified: Secondary | ICD-10-CM

## 2019-07-15 DIAGNOSIS — I714 Abdominal aortic aneurysm, without rupture, unspecified: Secondary | ICD-10-CM

## 2019-07-15 NOTE — Progress Notes (Signed)
[facility]  Patient name: Jeffrey [name] MRN: [id number] DOB: 10-27-1940 Sex: male      Virtual Visit via Telephone Note   This visit type was conducted due to national recommendations for restrictions regarding the COVID-19 Pandemic (e.g. social distancing) in an effort to limit this patient's exposure and mitigate transmission in our community.  Due to his co-morbid illnesses, this patient is at least at moderate risk for complications without adequate follow up.  This format is felt to be most appropriate for this patient at this time.  The patient did not have access to video technology/had technical difficulties with video requiring transitioning to audio format only (telephone).  All issues noted in this document were discussed and addressed.  No physical exam could be performed with this format.    Patient Location: Home Provider Location: Office    REASON FOR APPOINTMENT:    Follow up  HISTORY OF PRESENT ILLNESS:    Jeffrey [name] a 79 y.o.malewho returns today for follow up He is status post FEVAR with a scalloped to the superior mesenteric artery and bilateral renal stents on March 21, 2018. 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 He did have some left foot numbness following the procedure which improved with time.  He continues to take a statin for hypercholesterolemia. He is on an ARB for hypertension. He does take an aspirin. He is a former smoker. He has now begun having left leg cramping with walking.  He denies any rest pain or open wounds  The patient does not have symptoms concerning for COVID-19 infection (fever, chills, cough, or new shortness of breath).   PAST MEDICAL HISTORY    Past Medical History:  Diagnosis Date  . AAA (abdominal aortic aneurysm) ([place])   . Allergy   . Arthritis   . Colon polyps   . Coronary artery calcification   . Eczematous dermatitis   . GERD (gastroesophageal  reflux disease)   . Hypertension   . Iliac artery stenosis, left ([place])   . Osteopenia      FAMILY HISTORY   Family History  Problem Relation Age of Onset  . Diabetes Mother   . Hypertension Mother   . Stroke Mother   . Alcohol abuse Father   . Heart attack Father   . Heart disease Father   . Hypertension Father   . Cancer Sister   . Hyperlipidemia Sister   . Pancreatic cancer Sister   . Pancreatic cancer Brother   . Hypertension Son     SOCIAL HISTORY:   Social History   Socioeconomic History  . Marital status: Divorced    Spouse name: Not on file  . Number of children: 1  . Years of education: Not on file  . Highest education level: Not on file  Occupational History  . Occupation: Retired    Fish farm manager: [employer]  Tobacco Use  . Smoking status: Former Smoker    Packs/day: 1.00    Years: 32.00    Pack years: 32.00    Types: Cigarettes    Quit date: 08/17/1990    Years since quitting: 28.9  . Smokeless tobacco: Never Used  Substance and Sexual Activity  . Alcohol use: Yes    Comment: Occassionally, "when I feel like it"  . Drug use: No  . Sexual activity: Yes    Partners: Female  Other Topics Concern  . Not  on file  Social History Narrative  . Not on file   Social Determinants of Health   Financial Resource Strain:   . Difficulty of Paying Living Expenses: Not on file  Food Insecurity:   . Worried About Charity fundraiser in the Last Year: Not on file  . Ran Out of Food in the Last Year: Not on file  Transportation Needs:   . Lack of Transportation (Medical): Not on file  . Lack of Transportation (Non-Medical): Not on file  Physical Activity:   . Days of Exercise per Week: Not on file  . Minutes of Exercise per Session: Not on file  Stress:   . Feeling of Stress : Not on file  Social Connections:   . Frequency of Communication with Friends and Family: Not on file  . Frequency of Social Gatherings with Friends and Family: Not on file  .  Attends Religious Services: Not on file  . Active Member of Clubs or Organizations: Not on file  . Attends Archivist Meetings: Not on file  . Marital Status: Not on file  Intimate Partner Violence:   . Fear of Current or Ex-Partner: Not on file  . Emotionally Abused: Not on file  . Physically Abused: Not on file  . Sexually Abused: Not on file    ALLERGIES:    Allergies  Allergen Reactions  . Ketotifen Fumarate Swelling and Other (See Comments)    Eye swelling  . Triamcinolone Hives and Rash    CURRENT MEDICATIONS:    Current Outpatient Medications  Medication Sig Dispense Refill  . acetaminophen (TYLENOL) 500 MG tablet Take 1,000 mg by mouth every 6 (six) hours as needed for moderate pain or headache.    . alendronate (FOSAMAX) 70 MG tablet TAKE 1 TABLET BY MOUTH WEEKLY 12 tablet 0  . Ascorbic Acid (VITAMIN C) 1000 MG tablet Take 1,000 mg by mouth daily.     Marland Kitchen aspirin EC 81 MG tablet Take 81 mg by mouth daily.     Marland Kitchen atorvastatin (LIPITOR) 40 MG tablet TAKE 1 TABLET(40 MG) BY MOUTH DAILY AT 6 PM 90 tablet 3  . Calcium Carbonate-Vitamin D (CALCIUM 600+D) 600-200 MG-UNIT TABS Take 1 tablet by mouth every other day.     . Cyanocobalamin (RA VITAMIN B-12 TR) 1000 MCG TBCR Take 1,000 mcg by mouth daily.     . Emollient (CERAVE) LOTN Apply 1 application topically daily as needed (after shower care).     . ferrous sulfate 325 (65 FE) MG tablet Take 325 mg by mouth every Wednesday.    . fexofenadine (ALLEGRA) 180 MG tablet Take 180 mg by mouth daily as needed for allergies.     . fluticasone (FLONASE) 50 MCG/ACT nasal spray Place 1 spray into both nostrils every other day.    Marland Kitchen GLUCOSAMINE-CHONDROITIN PO Take 1 tablet by mouth 2 (two) times daily.     Marland Kitchen ibuprofen (ADVIL,MOTRIN) 200 MG tablet Take 200 mg by mouth every 6 (six) hours as needed for headache or moderate pain.    Marland Kitchen losartan (COZAAR) 25 MG tablet TAKE 1 TABLET(25 MG) BY MOUTH TWICE DAILY 180 tablet 1  . Multiple  Vitamins-Minerals (CENTRUM ADULTS PO) Take 1 tablet by mouth daily.     . Omega-3 1000 MG CAPS Take 1,000-2,000 mg by mouth See admin instructions. Take 1000 mg by mouth in the morning and take 2000 mg by mouth in the evening after supper     No current facility-administered medications for  this visit.    REVIEW OF SYSTEMS:   Please see the history of present illness.     All other systems reviewed and are negative.  PHYSICAL EXAM:   Vitals:   07/15/19 1340  Weight: 199 lb (90.3 kg)    GENERAL: The patient is in no acute distress.  PULMONARY: Nonlabored breathing NEUROLOGIC: No slurred   Recent Labs: 03/26/2019: ALT 15; BUN 13; Creatinine, Ser 1.00; Hemoglobin 12.2; Platelets 206.0; Potassium 4.3; Sodium 133; TSH 1.50   Recent Lipid Panel Lab Results  Component Value Date/Time   CHOL 116 03/26/2019 02:03 PM   TRIG 62.0 03/26/2019 02:03 PM   HDL 53.90 03/26/2019 02:03 PM   CHOLHDL 2 03/26/2019 02:03 PM   LDLCALC 50 03/26/2019 02:03 PM   LDLCALC 46 02/24/2017 02:45 PM    Wt Readings from Last 3 Encounters:  07/15/19 199 lb (90.3 kg)  03/26/19 199 lb 4 oz (90.4 kg)  03/05/19 201 lb 3.2 oz (91.3 kg)     STUDIES:   I have reviewed the following CTA:  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  Redemonstration of endovascular repair of abdominal aortic aneurysm with cook fenestrated device. No evidence of type 1 or type 2 endoleak  ASSESSMENT and PLAN   AAA:  Maximum aortic diameter is 4.8 cm.  Bilateral renal stents are widely patent as is the superior mesenteric artery.  Unfortunately, he has had interval occlusion of the left limb likely secondary to the tortuosity and kinking of the graft.  He is relatively stable.  He does get claudication-like symptoms after walking approximately 300 feet.  At this time, this has not been extremely lifestyle limiting.  I  have recommended an exercise program to see if we can mitigate his claudication symptoms and avoid femoral-femoral crossover bypass graft.  He is scheduled to follow-up with me in 6 months with a ultrasound of his renal arteries and the aneurysm.    Time:   Today, I have spent 22 minutes with the patient with telehealth technology discussing the above problems.      Leia Alf, MD, FACS [facility] [phone] Pager [phone]

## 2019-07-16 ENCOUNTER — Other Ambulatory Visit: Payer: Self-pay | Admitting: *Deleted

## 2019-07-16 DIAGNOSIS — I713 Abdominal aortic aneurysm, ruptured, unspecified: Secondary | ICD-10-CM

## 2019-07-16 DIAGNOSIS — Z9889 Other specified postprocedural states: Secondary | ICD-10-CM

## 2019-07-16 DIAGNOSIS — I739 Peripheral vascular disease, unspecified: Secondary | ICD-10-CM

## 2019-07-22 ENCOUNTER — Ambulatory Visit: Payer: Medicare Other | Attending: Internal Medicine

## 2019-07-22 ENCOUNTER — Ambulatory Visit: Payer: Medicare Other

## 2019-07-22 DIAGNOSIS — Z23 Encounter for immunization: Secondary | ICD-10-CM | POA: Insufficient documentation

## 2019-07-22 NOTE — Progress Notes (Signed)
   Covid-19 Vaccination Clinic  Name:  Jeffrey [name]    MRN: [id number] DOB: 1940-10-10  07/22/2019  [name] was observed post Covid-19 immunization for 15 minutes without incidence. He was provided with Vaccine Information Sheet and instruction to access the V-Safe system.   [name] was instructed to call 911 with any severe reactions post vaccine: Marland Kitchen Difficulty breathing  . Swelling of your face and throat  . A fast heartbeat  . A bad rash all over your body  . Dizziness and weakness    Immunizations Administered    Name Date Dose VIS Date Route   Pfizer COVID-19 Vaccine 07/22/2019 11:02 AM 0.3 mL 05/31/2019 Intramuscular   Manufacturer: [place]   Lot: [id number]   [place]: [id number]

## 2019-10-17 ENCOUNTER — Other Ambulatory Visit: Payer: Self-pay | Admitting: Family Medicine

## 2019-11-08 ENCOUNTER — Ambulatory Visit: Payer: Medicare Other | Admitting: Physician Assistant

## 2019-11-08 ENCOUNTER — Other Ambulatory Visit: Payer: Self-pay

## 2019-11-08 ENCOUNTER — Telehealth: Payer: Self-pay | Admitting: Physician Assistant

## 2019-11-08 DIAGNOSIS — L570 Actinic keratosis: Secondary | ICD-10-CM | POA: Diagnosis not present

## 2019-11-08 DIAGNOSIS — B079 Viral wart, unspecified: Secondary | ICD-10-CM

## 2019-11-08 DIAGNOSIS — B078 Other viral warts: Secondary | ICD-10-CM | POA: Diagnosis not present

## 2019-11-08 DIAGNOSIS — C44519 Basal cell carcinoma of skin of other part of trunk: Secondary | ICD-10-CM

## 2019-11-08 DIAGNOSIS — Z1283 Encounter for screening for malignant neoplasm of skin: Secondary | ICD-10-CM | POA: Diagnosis not present

## 2019-11-08 NOTE — Patient Instructions (Signed)

## 2019-11-08 NOTE — Telephone Encounter (Signed)
Patient came in for appointment today and paid a $25.00 copay by credit card.Marland Kitchen He needs a receipt with diagnosis, cost, and shows that he paid the #25.00.  Chart # [id number]

## 2019-11-10 ENCOUNTER — Encounter: Payer: Self-pay | Admitting: Physician Assistant

## 2019-11-12 ENCOUNTER — Encounter: Payer: Self-pay | Admitting: Physician Assistant

## 2019-11-12 NOTE — Progress Notes (Addendum)
Follow-Up Visit   Subjective  Jeffrey [name] is a 79 y.o. male who presents for the following: Annual Exam (Concerns left abdomen spot fell off but never healed, does bleed x months. left sideburn patient pulled it off and it bled too. x couple weeks. ).  The following portions of the chart were reviewed this encounter and updated as appropriate: Tobacco  Allergies  Meds  Problems  Med Hx  Surg Hx  Fam Hx      Objective  Well appearing patient in no apparent distress; mood and affect are within normal limits.  A full examination was performed including scalp, head, eyes, ears, nose, lips, neck, chest, axillae, abdomen, back, buttocks, bilateral upper extremities, bilateral lower extremities, hands, feet, fingers, toes, fingernails, and toenails. All findings within normal limits unless otherwise noted below.  Objective  Head - to toe: No DN  Objective  Head - Anterior (Face), Left Upper Back, Right Upper Back: Erythematous patches with gritty scale.  Objective  Left Abdomen (side) - Upper: Hyperkeratotic scale with pink base  Txpbx- cx1 cautery 1.3 cm 8 cm to SK     Objective  Verrucous papule-- Discussed viral etiology and contagion.   Mid Frontal Scalp: Hyperkeratotic scale with pink base  8.9 to upper ear junction Txpbx cx1 cautery- 1.2 cm   Images    Assessment & Plan  Screening exam for skin cancer Head - to toe  Skin exams  AK (actinic keratosis) (3) Head - Anterior (Face); Left Upper Back; Right Upper Back  Destruction of lesion - Head - Anterior (Face), Left Upper Back, Right Upper Back Complexity: simple   Destruction method: cryotherapy   Informed consent: discussed and consent obtained   Timeout:  patient name, date of birth, surgical site, and procedure verified Lesion destroyed using liquid nitrogen: Yes   Cryotherapy cycles:  1 Outcome: patient tolerated procedure well with no complications   Post-procedure details: wound care  instructions given    BCC (basal cell carcinoma), trunk Left Abdomen (side) - Upper  Skin / nail biopsy Type of biopsy: tangential   Informed consent: discussed and consent obtained   Timeout: patient name, date of birth, surgical site, and procedure verified   Anesthesia: the lesion was anesthetized in a standard fashion   Anesthetic:  1% lidocaine w/ epinephrine 1-100,000 local infiltration Instrument used: flexible razor blade   Hemostasis achieved with: aluminum chloride and electrodesiccation   Outcome: patient tolerated procedure well   Post-procedure details: wound care instructions given    Destruction of lesion Complexity: simple   Destruction method: electrodesiccation and curettage   Informed consent: discussed and consent obtained   Timeout:  patient name, date of birth, surgical site, and procedure verified Anesthesia: the lesion was anesthetized in a standard fashion   Anesthetic:  1% lidocaine w/ epinephrine 1-100,000 local infiltration Curettage performed in three different directions: Yes   Electrodesiccation performed over the curetted area: Yes   Curettage cycles:  3 Lesion length (cm):  1.3 Lesion width (cm):  1.3 Margin per side (cm):  0.1 Final wound size (cm):  1.5 Hemostasis achieved with:  aluminum chloride Outcome: patient tolerated procedure well with no complications   Post-procedure details: wound care instructions given    Specimen 2 - Surgical pathology Differential Diagnosis: scc Check Margins: No txpbx  Verruca Mid Frontal Scalp  Skin / nail biopsy - Mid Frontal Scalp Type of biopsy: tangential   Informed consent: discussed and consent obtained   Timeout: patient name, date of  birth, surgical site, and procedure verified   Anesthesia: the lesion was anesthetized in a standard fashion   Anesthetic:  1% lidocaine w/ epinephrine 1-100,000 local infiltration Instrument used: flexible razor blade   Hemostasis achieved with: aluminum  chloride and electrodesiccation   Outcome: patient tolerated procedure well   Post-procedure details: wound care instructions given    Destruction of lesion - Mid Frontal Scalp Complexity: simple   Destruction method: electrodesiccation and curettage   Informed consent: discussed and consent obtained   Timeout:  patient name, date of birth, surgical site, and procedure verified Anesthesia: the lesion was anesthetized in a standard fashion   Anesthetic:  1% lidocaine w/ epinephrine 1-100,000 local infiltration Curettage performed in three different directions: Yes   Curettage cycles:  3 Lesion length (cm):  1.2 Lesion width (cm):  1.2 Margin per side (cm):  0.1 Final wound size (cm):  1.4 Hemostasis achieved with:  aluminum chloride Outcome: patient tolerated procedure well with no complications   Post-procedure details: wound care instructions given    Specimen 1 - Surgical pathology Differential Diagnosis: scc Check Margins: No txpbx

## 2019-11-19 ENCOUNTER — Telehealth: Payer: Self-pay | Admitting: Physician Assistant

## 2019-11-19 NOTE — Telephone Encounter (Signed)
I spoke with patient regarding his wound.  It was doing much better. In addition I went over his pathology report. Lesion was treated at time of the visit. Follow up as needed.

## 2020-01-15 ENCOUNTER — Other Ambulatory Visit: Payer: Self-pay | Admitting: Family Medicine

## 2020-01-15 ENCOUNTER — Other Ambulatory Visit: Payer: Self-pay | Admitting: Cardiology

## 2020-02-03 ENCOUNTER — Other Ambulatory Visit: Payer: Self-pay

## 2020-02-03 ENCOUNTER — Ambulatory Visit: Payer: Medicare Other | Admitting: Cardiology

## 2020-02-03 ENCOUNTER — Encounter: Payer: Self-pay | Admitting: Cardiology

## 2020-02-03 VITALS — BP 160/98 | HR 54 | Temp 97.1°F | Ht 69.0 in | Wt 197.0 lb

## 2020-02-03 DIAGNOSIS — E785 Hyperlipidemia, unspecified: Secondary | ICD-10-CM

## 2020-02-03 DIAGNOSIS — I714 Abdominal aortic aneurysm, without rupture, unspecified: Secondary | ICD-10-CM

## 2020-02-03 DIAGNOSIS — Z7189 Other specified counseling: Secondary | ICD-10-CM

## 2020-02-03 DIAGNOSIS — I2510 Atherosclerotic heart disease of native coronary artery without angina pectoris: Secondary | ICD-10-CM | POA: Diagnosis not present

## 2020-02-03 DIAGNOSIS — I739 Peripheral vascular disease, unspecified: Secondary | ICD-10-CM

## 2020-02-03 DIAGNOSIS — I10 Essential (primary) hypertension: Secondary | ICD-10-CM | POA: Diagnosis not present

## 2020-02-03 MED ORDER — LOSARTAN POTASSIUM 25 MG PO TABS: ORAL_TABLET | ORAL | 3 refills | Status: DC

## 2020-02-03 MED ORDER — AMLODIPINE BESYLATE 5 MG PO TABS: 5.0000 mg | ORAL_TABLET | Freq: Every day | ORAL | 3 refills | Status: DC

## 2020-02-03 NOTE — Progress Notes (Signed)
Cardiology Office Note:    Date:  02/03/2020   ID:  Jeffrey [name], DOB [birth year], MRN [id number]  PCP:  Vivi Barrack, MD  Cardiologist:  Buford Dresser, MD PhD  Referring MD: Vivi Barrack, MD   Chief complaint: follow up  History of Present Illness:    Jeffrey [name] is a 79 y.o. male with a hx of AAA s/p EVAR by Dr. Trula Slade, PAD, hypertension, coronary artery calcification who is seen for follow up today. I initially met him 02/02/18 as a new consult at the request of Dr. [name] for preoperative evaluation prior to vascular surgery.   Today: Three concerns today: -CT angio 07/10/19 noted that his AAA repair is doing well, but there is occlusion of the left iliac limb. Had ABI, mild left lower extremity disease noted. Has impacted his ability to walk, but able to walk at least 1 mile/day, sometimes more. Has chronic orthopedic pain, hard for him to differentiate orthopedic pain from PAD pain.  Brings a log of BP with him today.  Denies chest pain, shortness of breath at rest or with normal exertion. No PND, orthopnea, LE edema or unexpected weight gain. No syncope or palpitations. Since about June 20201, BP has been more elevated that usual. Range 139-171/78-108.  Doesn't have any symptoms with elevated blood pressure. Went up to 75 mg losartan daily in 09/2019 and BP remains elevated.   Discussed thiazide, amlodipine, and beta blocker today. Would avoid beta blocker due to resting bradycardia, would be cautious about thiazide given prior hyponatremia and already having frequent urination.   Has appt with Dr. [name] in early October.   Past Medical History:  Diagnosis Date  . AAA (abdominal aortic aneurysm) ([place])   . Allergy   . Arthritis   . Colon polyps   . Coronary artery calcification   . Eczematous dermatitis   . GERD (gastroesophageal reflux disease)   . Hypertension   . Iliac artery stenosis, left ([place])   . Osteopenia     Past Surgical History:  Procedure  Laterality Date  . ABDOMINAL AORTIC ENDOVASCULAR FENESTRATED STENT GRAFT N/A 03/21/2018   Procedure: ABDOMINAL AORTIC ENDOVASCULAR FENESTRATED STENT GRAFT ; [place] CT PERFORMED;  Surgeon: [name], MD;  Location: [facility];  Service: Vascular;  Laterality: N/A;  . Basal Skin Cancer  2012  . CATARACT EXTRACTION Bilateral   . TONSILLECTOMY    . TRANSURETHRAL RESECTION OF PROSTATE      Current Medications: Current Outpatient Medications on File Prior to Visit  Medication Sig  . acetaminophen (TYLENOL) 500 MG tablet Take 1,000 mg by mouth every 6 (six) hours as needed for moderate pain or headache.  . Ascorbic Acid (VITAMIN C) 1000 MG tablet Take 1,000 mg by mouth daily.   Marland Kitchen aspirin EC 81 MG tablet Take 81 mg by mouth daily.   Marland Kitchen atorvastatin (LIPITOR) 40 MG tablet TAKE 1 TABLET(40 MG) BY MOUTH DAILY AT 6 PM  . Calcium Carbonate-Vitamin D (CALCIUM 600+D) 600-200 MG-UNIT TABS Take 1 tablet by mouth every other day.   . Cyanocobalamin (RA VITAMIN B-12 TR) 1000 MCG TBCR Take 1,000 mcg by mouth daily.   . Emollient (CERAVE) LOTN Apply 1 application topically daily as needed (after shower care).   . ferrous sulfate 325 (65 FE) MG tablet Take 325 mg by mouth every Wednesday.  . fexofenadine (ALLEGRA) 180 MG tablet Take 180 mg by mouth daily as needed for allergies.   . fluticasone (FLONASE) 50 MCG/ACT nasal spray Place 1  spray into both nostrils every other day.  Marland Kitchen GLUCOSAMINE-CHONDROITIN PO Take 1 tablet by mouth 2 (two) times daily.   Marland Kitchen ibuprofen (ADVIL,MOTRIN) 200 MG tablet Take 200 mg by mouth every 6 (six) hours as needed for headache or moderate pain.  Marland Kitchen losartan (COZAAR) 25 MG tablet TAKE 1 TABLET(25 MG) BY MOUTH TWICE DAILY  . Multiple Vitamins-Minerals (CENTRUM ADULTS PO) Take 1 tablet by mouth daily.   . Omega-3 1000 MG CAPS Take 1,000-2,000 mg by mouth See admin instructions. Take 1000 mg by mouth in the morning and take 2000 mg by mouth in the evening after supper   No current  facility-administered medications on file prior to visit.     Allergies:   Ketotifen fumarate and Triamcinolone   Social History   Tobacco Use  . Smoking status: Former Smoker    Packs/day: 1.00    Years: 32.00    Pack years: 32.00    Types: Cigarettes    Quit date: 08/17/1990    Years since quitting: 29.4  . Smokeless tobacco: Never Used  Vaping Use  . Vaping Use: Never used  Substance Use Topics  . Alcohol use: Yes    Comment: Occassionally, "when I feel like it"  . Drug use: No    Family History: The patient's family history includes Alcohol abuse in his father; Cancer in his sister; Diabetes in his mother; Heart attack in his father; Heart disease in his father; Hyperlipidemia in his sister; Hypertension in his father, mother, and son; Pancreatic cancer in his brother and sister; Stroke in his mother.  ROS:   Please see the history of present illness.  Additional pertinent ROS:  EKGs/Labs/Other Studies Reviewed:    The following studies were reviewed today: Prior notes. No prior ECGs, stress tests, or echo in the system.  EKG:  EKG is ordered today.  The ekg ordered today demonstrates sinus bradycardia at 54 bpm  Recent Labs: 03/26/2019: ALT 15; BUN 13; Creatinine, Ser 1.00; Hemoglobin 12.2; Platelets 206.0; Potassium 4.3; Sodium 133; TSH 1.50  Recent Lipid Panel    Component Value Date/Time   CHOL 116 03/26/2019 1403   TRIG 62.0 03/26/2019 1403   HDL 53.90 03/26/2019 1403   CHOLHDL 2 03/26/2019 1403   VLDL 12.4 03/26/2019 1403   LDLCALC 50 03/26/2019 1403   LDLCALC 46 02/24/2017 1445    Physical Exam:    VS:  BP (!) 160/98   Pulse (!) 54   Temp (!) 97.1 F (36.2 C)   Ht '5\' 9"'  (1.753 m)   Wt 197 lb (89.4 kg)   SpO2 98%   BMI 29.09 kg/m     Wt Readings from Last 3 Encounters:  02/03/20 197 lb (89.4 kg)  07/15/19 199 lb (90.3 kg)  03/26/19 199 lb 4 oz (90.4 kg)    GEN: Well nourished, well developed in no acute distress HEENT: Normal, moist mucous  membranes NECK: No JVD CARDIAC: regular rhythm, normal S1 and S2, no rubs or gallops. No murmur. VASCULAR: Radial pulses 2+ bilaterally. No carotid bruits. Lower extremities warm, difficult to palpate pulses today RESPIRATORY:  Clear to auscultation without rales, wheezing or rhonchi  ABDOMEN: Soft, non-tender, non-distended MUSCULOSKELETAL:  Ambulates independently SKIN: Warm and dry, no edema NEUROLOGIC:  Alert and oriented x 3. No focal neuro deficits noted. PSYCHIATRIC:  Normal affect   ASSESSMENT:    1. Essential hypertension   2. PVD (peripheral vascular disease) ([place])   3. Coronary artery calcification seen on CT scan  4. Abdominal aortic aneurysm (AAA) without rupture ([place])   5. Hyperlipidemia, unspecified hyperlipidemia type   6. Cardiac risk counseling   7. Counseling on health promotion and disease prevention    PLAN:    AAA s/p FEVAR and bilateral renal stents 2019: followed by Dr. Trula Slade -continue aspirin, statin -needs improved blood pressure control, see below  Claudication vs. Other leg pain: difficult to palpate distal pulses today. Has upcoming appt with Dr. Trula Slade. Encouraging that he is able to walk a mild daily  Hypertension:  -was taking 25 mg losartan BID -will increase to 50 mg losartan in the AM and keep 25 mg in the PM -will also start amlodipine 5 mg daily -discussed how BP control is crucial given his AAA history  Coronary calcification on imaging Hyperlipidemia -LDL goal <70 give AAA, coronary calcification -tolerating high intensity statin, atorvastatin 40 mg daily  Cardiovascular risk evaluation and prevention counseling: -recommend heart healthy/Mediterranean diet, with whole grains, fruits, vegetable, fish, lean meats, nuts, and olive oil. Limit salt. -recommend moderate walking, 3-5 times/week for 30-50 minutes each session. Aim for at least 150 minutes.week. Goal should be pace of 3 miles/hours, or walking 1.5 miles in 30  minutes -recommend avoidance of tobacco products. Avoid excess alcohol.  Plan for follow up: annually for secondary prevention. Has upcoming appts with both Dr. Trula Slade and Dr. [name]. He will contact me if home blood pressure numbers drop consistently low  Medication Adjustments/Labs and Tests Ordered: Current medicines are reviewed at length with the patient today.  Concerns regarding medicines are outlined above.  Orders Placed This Encounter  Procedures  . Basic metabolic panel  . Lipid panel  . EKG 12-Lead   Meds ordered this encounter  Medications  . amLODipine (NORVASC) 5 MG tablet    Sig: Take 1 tablet (5 mg total) by mouth daily.    Dispense:  90 tablet    Refill:  3  . losartan (COZAAR) 25 MG tablet    Sig: Take 50 mg in the morning and 25 mg in the evening every day.    Dispense:  270 tablet    Refill:  3    Patient Instructions  Medication Instructions:  Start Amlodipine 5 mg daily  *If you need a refill on your cardiac medications before your next appointment, please call your pharmacy*   Lab Work: Your physician recommends that you return for lab work today ( BMP, Fasting Lipid)    Testing/Procedures: None   Follow-Up: At [facility], you and your health needs are our priority.  As part of our continuing mission to provide you with exceptional heart care, we have created designated Provider Care Teams.  These Care Teams include your primary Cardiologist (physician) and Advanced Practice Providers (APPs -  Physician Assistants and Nurse Practitioners) who all work together to provide you with the care you need, when you need it.  We recommend signing up for the patient portal called "MyChart".  Sign up information is provided on this After Visit Summary.  MyChart is used to connect with patients for Virtual Visits (Telemedicine).  Patients are able to view lab/test results, encounter notes, upcoming appointments, etc.  Non-urgent messages can be sent to  your provider as well.   To learn more about what you can do with MyChart, go to [email or web address].    Your next appointment:   1 year(s)  The format for your next appointment:   In Person  Provider:   Buford Dresser, MD  Signed, Buford Dresser, MD PhD 02/03/2020    [place]

## 2020-02-03 NOTE — Patient Instructions (Signed)
Medication Instructions:  Start Amlodipine 5 mg daily  *If you need a refill on your cardiac medications before your next appointment, please call your pharmacy*   Lab Work: Your physician recommends that you return for lab work today ( BMP, Fasting Lipid)    Testing/Procedures: None   Follow-Up: At [facility], you and your health needs are our priority.  As part of our continuing mission to provide you with exceptional heart care, we have created designated Provider Care Teams.  These Care Teams include your primary Cardiologist (physician) and Advanced Practice Providers (APPs -  Physician Assistants and Nurse Practitioners) who all work together to provide you with the care you need, when you need it.  We recommend signing up for the patient portal called "MyChart".  Sign up information is provided on this After Visit Summary.  MyChart is used to connect with patients for Virtual Visits (Telemedicine).  Patients are able to view lab/test results, encounter notes, upcoming appointments, etc.  Non-urgent messages can be sent to your provider as well.   To learn more about what you can do with MyChart, go to [email or web address].    Your next appointment:   1 year(s)  The format for your next appointment:   In Person  Provider:   Buford Dresser, MD

## 2020-02-04 LAB — LIPID PANEL
Chol/HDL Ratio: 2 ratio (ref 0.0–5.0)
Cholesterol, Total: 117 mg/dL (ref 100–199)
HDL: 60 mg/dL
LDL Chol Calc (NIH): 44 mg/dL (ref 0–99)
Triglycerides: 61 mg/dL (ref 0–149)
VLDL Cholesterol Cal: 13 mg/dL (ref 5–40)

## 2020-02-04 LAB — BASIC METABOLIC PANEL
BUN/Creatinine Ratio: 9 — ABNORMAL LOW (ref 10–24)
BUN: 10 mg/dL (ref 8–27)
CO2: 23 mmol/L (ref 20–29)
Calcium: 9.2 mg/dL (ref 8.6–10.2)
Chloride: 97 mmol/L (ref 96–106)
Creatinine, Ser: 1.09 mg/dL (ref 0.76–1.27)
GFR calc Af Amer: 75 mL/min/{1.73_m2} (ref >59)
GFR calc non Af Amer: 65 mL/min/{1.73_m2} (ref >59)
Glucose: 86 mg/dL (ref 65–99)
Potassium: 5.3 mmol/L — ABNORMAL HIGH (ref 3.5–5.2)
Sodium: 135 mmol/L (ref 134–144)

## 2020-03-09 ENCOUNTER — Ambulatory Visit (INDEPENDENT_AMBULATORY_CARE_PROVIDER_SITE_OTHER)
Admission: RE | Admit: 2020-03-09 | Discharge: 2020-03-09 | Disposition: A | Discharge status: Home / Self Care | Payer: Medicare Other | Source: Ambulatory Visit | Attending: Surgery | Admitting: Surgery

## 2020-03-09 ENCOUNTER — Ambulatory Visit (HOSPITAL_COMMUNITY)
Admission: RE | Admit: 2020-03-09 | Discharge: 2020-03-09 | Disposition: A | Discharge status: Home / Self Care | Payer: Medicare Other | Source: Ambulatory Visit | Attending: Surgery | Admitting: Surgery

## 2020-03-09 ENCOUNTER — Other Ambulatory Visit: Payer: Self-pay

## 2020-03-09 ENCOUNTER — Ambulatory Visit (INDEPENDENT_AMBULATORY_CARE_PROVIDER_SITE_OTHER): Payer: Medicare Other | Admitting: Surgery

## 2020-03-09 ENCOUNTER — Encounter: Payer: Self-pay | Admitting: Surgery

## 2020-03-09 VITALS — BP 133/85 | HR 64 | Temp 97.9°F | Resp 20 | Ht 69.0 in | Wt 189.0 lb

## 2020-03-09 DIAGNOSIS — I739 Peripheral vascular disease, unspecified: Secondary | ICD-10-CM | POA: Diagnosis not present

## 2020-03-09 DIAGNOSIS — I714 Abdominal aortic aneurysm, without rupture, unspecified: Secondary | ICD-10-CM

## 2020-03-09 DIAGNOSIS — I713 Abdominal aortic aneurysm, ruptured, unspecified: Secondary | ICD-10-CM

## 2020-03-09 DIAGNOSIS — Z9889 Other specified postprocedural states: Secondary | ICD-10-CM | POA: Insufficient documentation

## 2020-03-09 NOTE — Progress Notes (Signed)
[facility]ist [facility]  Patient name: Gumaro Brightbill MRN: [id number] DOB: 09-08-1940 Sex: male   REASON FOR VISIT:    Follow up  [place] ILLNESS:    Kostantinos Tallman a 79 y.o.malewho returns today forfollow upHe is status post FEVAR with a scalloped to the superior mesenteric artery and bilateral renal stents on March 21, 2018. 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 He did have some left foot numbness following the procedure which improved with time.  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  He was relatively asymptomatic with mild claudication at 300 feet.  We started a exercise program.  He is now walking a mile a day and states that his activity is limited more by his back pain than his legs  He continues to take a statin for hypercholesterolemia. He is on an ARB for hypertension. He does take an aspirin. He is a former smoker. He has now begun having left leg cramping with walking.  He denies any rest pain or open wounds  PAST MEDICAL HISTORY:   Past Medical History:  Diagnosis Date  . AAA (abdominal aortic aneurysm) ([place])   . Allergy   . Arthritis   . Colon polyps   . Coronary artery calcification   . Eczematous dermatitis   . GERD (gastroesophageal reflux disease)   . Hypertension   . Iliac artery stenosis, left ([place])   . Osteopenia      FAMILY HISTORY:   Family History  Problem Relation Age of Onset  . Diabetes Mother   . Hypertension Mother   . Stroke Mother   . Alcohol abuse Father   . Heart attack Father   . Heart disease Father   . Hypertension Father   . Cancer Sister   . Hyperlipidemia Sister   . Pancreatic cancer Sister   . Pancreatic cancer Brother   . Hypertension Son     SOCIAL HISTORY:   Social History   Tobacco Use  . Smoking status: Former Smoker    Packs/day: 1.00    Years: 32.00    Pack years: 32.00     Types: Cigarettes    Quit date: 08/17/1990    Years since quitting: 29.5  . Smokeless tobacco: Never Used  Substance Use Topics  . Alcohol use: Yes    Comment: Occassionally, "when I feel like it"     ALLERGIES:   Allergies  Allergen Reactions  . Ketotifen Fumarate Swelling and Other (See Comments)    Eye swelling  . Triamcinolone Hives and Rash     CURRENT MEDICATIONS:   Current Outpatient Medications  Medication Sig Dispense Refill  . acetaminophen (TYLENOL) 500 MG tablet Take 1,000 mg by mouth every 6 (six) hours as needed for moderate pain or headache.    Marland Kitchen amLODipine (NORVASC) 5 MG tablet Take 1 tablet (5 mg total) by mouth daily. 90 tablet 3  . Ascorbic Acid (VITAMIN C) 1000 MG tablet Take 1,000 mg by mouth daily.     Marland Kitchen aspirin EC 81 MG tablet Take 81 mg by mouth daily.     Marland Kitchen atorvastatin (LIPITOR) 40 MG tablet TAKE 1 TABLET(40 MG) BY MOUTH DAILY AT 6 PM 90 tablet 2  . Calcium Carbonate-Vitamin D (CALCIUM 600+D) 600-200 MG-UNIT TABS Take 1 tablet by mouth every other day.     . Cyanocobalamin (RA VITAMIN B-12 TR) 1000 MCG TBCR Take 1,000 mcg by mouth  daily.     . Emollient (CERAVE) LOTN Apply 1 application topically daily as needed (after shower care).     . ferrous sulfate 325 (65 FE) MG tablet Take 325 mg by mouth every Wednesday.    . fexofenadine (ALLEGRA) 180 MG tablet Take 180 mg by mouth daily as needed for allergies.     . fluticasone (FLONASE) 50 MCG/ACT nasal spray Place 1 spray into both nostrils every other day.    Marland Kitchen GLUCOSAMINE-CHONDROITIN PO Take 1 tablet by mouth 2 (two) times daily.     Marland Kitchen ibuprofen (ADVIL,MOTRIN) 200 MG tablet Take 200 mg by mouth every 6 (six) hours as needed for headache or moderate pain.    Marland Kitchen losartan (COZAAR) 25 MG tablet Take 50 mg in the morning and 25 mg in the evening every day. 270 tablet 3  . Multiple Vitamins-Minerals (CENTRUM ADULTS PO) Take 1 tablet by mouth daily.     . Omega-3 1000 MG CAPS Take 1,000-2,000 mg by mouth See  admin instructions. Take 1000 mg by mouth in the morning and take 2000 mg by mouth in the evening after supper     No current facility-administered medications for this visit.    REVIEW OF SYSTEMS:   [X]  denotes positive finding, [ ]  denotes negative finding Cardiac  Comments:  Chest pain or chest pressure:    Shortness of breath upon exertion:    Short of breath when lying flat:    Irregular heart rhythm:        Vascular    Pain in calf, thigh, or hip brought on by ambulation: x   Pain in feet at night that wakes you up from your sleep:     Blood clot in your veins:    Leg swelling:         Pulmonary    Oxygen at home:    Productive cough:     Wheezing:         Neurologic    Sudden weakness in arms or legs:     Sudden numbness in arms or legs:     Sudden onset of difficulty speaking or slurred speech:    Temporary loss of vision in one eye:     Problems with dizziness:         Gastrointestinal    Blood in stool:     Vomited blood:         Genitourinary    Burning when urinating:     Blood in urine:        Psychiatric    Major depression:         Hematologic    Bleeding problems:    Problems with blood clotting too easily:        Skin    Rashes or ulcers:        Constitutional    Fever or chills:      PHYSICAL EXAM:   Vitals:   03/09/20 0919  BP: 133/85  Pulse: 64  Resp: 20  Temp: 97.9 F (36.6 C)  SpO2: 98%  Weight: 189 lb (85.7 kg)  Height: 5\' 9"  (1.753 m)    GENERAL: The patient is a well-nourished male, in no acute distress. The vital signs are documented above. CARDIAC: There is a regular rate and rhythm.  VASCULAR: Both lower extremities are warm and well-perfused.  Palpable radial pulses bilaterally PULMONARY: Non-labored respirations ABDOMEN: Soft and non-tender  MUSCULOSKELETAL: There are no major deformities or cyanosis. NEUROLOGIC: No focal weakness or  paresthesias are detected. SKIN: There are no ulcers or rashes  noted. PSYCHIATRIC: The patient has a normal affect.  STUDIES:   I have reviewed the following studies: EVAR:  Max diameter - 4.1 cm Renal: No evidence of renal artery stenosis, normal kidney size bilaterally +-------+-----------+-----------+------------+------------+  ABI/TBIToday's ABIToday's TBIPrevious ABIPrevious TBI  +-------+-----------+-----------+------------+------------+  Right 1.12    0.71    1.22    0.78      +-------+-----------+-----------+------------+------------+  Left  0.78    0.29    0.81    0.46      +-------+-----------+-----------+------------+------------+  MEDICAL ISSUES:   AAA: Maximum aortic diameter is down to 4.1 cm.  Both renal artery stents are widely patent.  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  In fact he has increased his walking distance to where he is now walking over a mile per day.  He wants to increase this to over 2 miles per day.  His walking is limited more by his back pain that his leg pain.  He will continue with his statin and aspirin.  I have him scheduled for surveillance images in 1 year.    Leia Alf, MD, FACS [facility] [phone] Pager [phone]

## 2020-03-29 ENCOUNTER — Encounter: Payer: Self-pay | Admitting: Cardiology

## 2020-04-02 ENCOUNTER — Encounter: Payer: Self-pay | Admitting: Family Medicine

## 2020-04-02 DIAGNOSIS — Z0120 Encounter for dental examination and cleaning without abnormal findings: Secondary | ICD-10-CM | POA: Diagnosis not present

## 2020-04-06 ENCOUNTER — Ambulatory Visit (INDEPENDENT_AMBULATORY_CARE_PROVIDER_SITE_OTHER): Payer: Medicare Other

## 2020-04-06 DIAGNOSIS — Z0000 Encounter for general adult medical examination without abnormal findings: Secondary | ICD-10-CM | POA: Diagnosis not present

## 2020-04-06 NOTE — Patient Instructions (Signed)
Mr. Jeffrey [name] , Thank you for taking time to come for your Medicare Wellness Visit. I appreciate your ongoing commitment to your health goals. Please review the following plan we discussed and let me know if I can assist you in the future.   Screening recommendations/referrals: Colonoscopy: No longer required  Recommended yearly ophthalmology/optometry visit for glaucoma screening and checkup Recommended yearly dental visit for hygiene and checkup  Vaccinations: Influenza vaccine: Pt wants to receive on  04/09/20 office visit  Pneumococcal vaccine: Up to date Tdap vaccine: Up to date Shingles vaccine: Completed  9/9 & 05/14/19 Covid-19: Completed 1/11 & 07/22/19  Advanced directives: Copy will be left at front desk for pick up at 04/09/20 appt   Conditions/risks identified: Stay Healthy   Next appointment: Follow up in one year for your annual wellness visit.   Preventive Care 79 Years and Older, Male Preventive care refers to lifestyle choices and visits with your health care provider that can promote health and wellness. What does preventive care include?  A yearly physical exam. This is also called an annual well check.  Dental exams once or twice a year.  Routine eye exams. Ask your health care provider how often you should have your eyes checked.  Personal lifestyle choices, including:  Daily care of your teeth and gums.  Regular physical activity.  Eating a healthy diet.  Avoiding tobacco and drug use.  Limiting alcohol use.  Practicing safe sex.  Taking low doses of aspirin every day.  Taking vitamin and mineral supplements as recommended by your health care provider. What happens during an annual well check? The services and screenings done by your health care provider during your annual well check will depend on your age, overall health, lifestyle risk factors, and family history of disease. Counseling  Your health care provider may ask you questions about  your:  Alcohol use.  Tobacco use.  Drug use.  Emotional well-being.  Home and relationship well-being.  Sexual activity.  Eating habits.  History of falls.  Memory and ability to understand (cognition).  Work and work Statistician. Screening  You may have the following tests or measurements:  Height, weight, and BMI.  Blood pressure.  Lipid and cholesterol levels. These may be checked every 5 years, or more frequently if you are over 72 years old.  Skin check.  Lung cancer screening. You may have this screening every year starting at age 27 if you have a 30-pack-year history of smoking and currently smoke or have quit within the past 15 years.  Fecal occult blood test (FOBT) of the stool. You may have this test every year starting at age 42.  Flexible sigmoidoscopy or colonoscopy. You may have a sigmoidoscopy every 5 years or a colonoscopy every 10 years starting at age 73.  Prostate cancer screening. Recommendations will vary depending on your family history and other risks.  Hepatitis C blood test.  Hepatitis B blood test.  Sexually transmitted disease (STD) testing.  Diabetes screening. This is done by checking your blood sugar (glucose) after you have not eaten for a while (fasting). You may have this done every 1-3 years.  Abdominal aortic aneurysm (AAA) screening. You may need this if you are a current or former smoker.  Osteoporosis. You may be screened starting at age 33 if you are at high risk. Talk with your health care provider about your test results, treatment options, and if necessary, the need for more tests. Vaccines  Your health care provider may recommend  certain vaccines, such as:  Influenza vaccine. This is recommended every year.  Tetanus, diphtheria, and acellular pertussis (Tdap, Td) vaccine. You may need a Td booster every 10 years.  Zoster vaccine. You may need this after age 40.  Pneumococcal 13-valent conjugate (PCV13) vaccine.  One dose is recommended after age 73.  Pneumococcal polysaccharide (PPSV23) vaccine. One dose is recommended after age 47. Talk to your health care provider about which screenings and vaccines you need and how often you need them. This information is not intended to replace advice given to you by your health care provider. Make sure you discuss any questions you have with your health care provider. Document Released: 07/03/2015 Document Revised: 02/24/2016 Document Reviewed: 04/07/2015 Elsevier Interactive Patient Education  2017 [place] Prevention in the Home Falls can cause injuries. They can happen to people of all ages. There are many things you can do to make your home safe and to help prevent falls. What can I do on the outside of my home?  Regularly fix the edges of walkways and driveways and fix any cracks.  Remove anything that might make you trip as you walk through a door, such as a raised step or threshold.  Trim any bushes or trees on the path to your home.  Use bright outdoor lighting.  Clear any walking paths of anything that might make someone trip, such as rocks or tools.  Regularly check to see if handrails are loose or broken. Make sure that both sides of any steps have handrails.  Any raised decks and porches should have guardrails on the edges.  Have any leaves, snow, or ice cleared regularly.  Use sand or salt on walking paths during winter.  Clean up any spills in your garage right away. This includes oil or grease spills. What can I do in the bathroom?  Use night lights.  Install grab bars by the toilet and in the tub and shower. Do not use towel bars as grab bars.  Use non-skid mats or decals in the tub or shower.  If you need to sit down in the shower, use a plastic, non-slip stool.  Keep the floor dry. Clean up any water that spills on the floor as soon as it happens.  Remove soap buildup in the tub or shower regularly.  Attach bath  mats securely with double-sided non-slip rug tape.  Do not have throw rugs and other things on the floor that can make you trip. What can I do in the bedroom?  Use night lights.  Make sure that you have a light by your bed that is easy to reach.  Do not use any sheets or blankets that are too big for your bed. They should not hang down onto the floor.  Have a firm chair that has side arms. You can use this for support while you get dressed.  Do not have throw rugs and other things on the floor that can make you trip. What can I do in the kitchen?  Clean up any spills right away.  Avoid walking on wet floors.  Keep items that you use a lot in easy-to-reach places.  If you need to reach something above you, use a strong step stool that has a grab bar.  Keep electrical cords out of the way.  Do not use floor polish or wax that makes floors slippery. If you must use wax, use non-skid floor wax.  Do not have throw rugs and other  things on the floor that can make you trip. What can I do with my stairs?  Do not leave any items on the stairs.  Make sure that there are handrails on both sides of the stairs and use them. Fix handrails that are broken or loose. Make sure that handrails are as long as the stairways.  Check any carpeting to make sure that it is firmly attached to the stairs. Fix any carpet that is loose or worn.  Avoid having throw rugs at the top or bottom of the stairs. If you do have throw rugs, attach them to the floor with carpet tape.  Make sure that you have a light switch at the top of the stairs and the bottom of the stairs. If you do not have them, ask someone to add them for you. What else can I do to help prevent falls?  Wear shoes that:  Do not have high heels.  Have rubber bottoms.  Are comfortable and fit you well.  Are closed at the toe. Do not wear sandals.  If you use a stepladder:  Make sure that it is fully opened. Do not climb a closed  stepladder.  Make sure that both sides of the stepladder are locked into place.  Ask someone to hold it for you, if possible.  Clearly mark and make sure that you can see:  Any grab bars or handrails.  First and last steps.  Where the edge of each step is.  Use tools that help you move around (mobility aids) if they are needed. These include:  Canes.  Walkers.  Scooters.  Crutches.  Turn on the lights when you go into a dark area. Replace any light bulbs as soon as they burn out.  Set up your furniture so you have a clear path. Avoid moving your furniture around.  If any of your floors are uneven, fix them.  If there are any pets around you, be aware of where they are.  Review your medicines with your doctor. Some medicines can make you feel dizzy. This can increase your chance of falling. Ask your doctor what other things that you can do to help prevent falls. This information is not intended to replace advice given to you by your health care provider. Make sure you discuss any questions you have with your health care provider. Document Released: 04/02/2009 Document Revised: 11/12/2015 Document Reviewed: 07/11/2014 Elsevier Interactive Patient Education  2017 [employer].

## 2020-04-06 NOTE — Progress Notes (Signed)
Virtual Visit via Telephone Note  I connected with  [name] on 04/06/20 at  1:45 PM EDT by telephone and verified that I am speaking with the correct person using two identifiers.  Medicare Annual Wellness visit completed telephonically due to Covid-19 pandemic.   Persons participating in this call: This Health Coach and this patient.   Location: Patient: Home  Provider: Office   I discussed the limitations, risks, security and privacy concerns of performing an evaluation and management service by telephone and the availability of in person appointments. The patient expressed understanding and agreed to proceed.  Unable to perform video visit due to video visit attempted and failed and/or patient does not have video capability.   Some vital signs may be absent or patient reported.   Willette Brace, LPN    Subjective:   Jeffrey [name] is a 79 y.o. male who presents for Medicare Annual/Subsequent preventive examination.  Review of Systems     Cardiac Risk Factors include: hypertension;dyslipidemia;male gender     Objective:    There were no vitals filed for this visit. There is no height or weight on file to calculate BMI.  Advanced Directives 04/06/2020 03/05/2019 12/24/2018 04/23/2018 03/21/2018 03/13/2018 02/28/2018  Does Patient Have a Medical Advance Directive? No No No No No No No  Would patient like information on creating a medical advance directive? Yes (MAU/Ambulatory/Procedural Areas - Information given) Yes (MAU/Ambulatory/Procedural Areas - Information given) No - Patient declined No - Patient declined No - Patient declined No - Patient declined Yes (MAU/Ambulatory/Procedural Areas - Information given)    Current Medications (verified) Outpatient Encounter Medications as of 04/06/2020  Medication Sig  . acetaminophen (TYLENOL) 500 MG tablet Take 1,000 mg by mouth every 6 (six) hours as needed for moderate pain or headache.  Marland Kitchen amLODipine (NORVASC) 5 MG tablet  Take 1 tablet (5 mg total) by mouth daily.  . Ascorbic Acid (VITAMIN C) 1000 MG tablet Take 1,000 mg by mouth daily.   Marland Kitchen aspirin EC 81 MG tablet Take 81 mg by mouth daily.   Marland Kitchen atorvastatin (LIPITOR) 40 MG tablet TAKE 1 TABLET(40 MG) BY MOUTH DAILY AT 6 PM  . Calcium Carbonate-Vitamin D (CALCIUM 600+D) 600-200 MG-UNIT TABS Take 1 tablet by mouth every other day.   . Cyanocobalamin (RA VITAMIN B-12 TR) 1000 MCG TBCR Take 1,000 mcg by mouth daily.   . Emollient (CERAVE) LOTN Apply 1 application topically daily as needed (after shower care).   . ferrous sulfate 325 (65 FE) MG tablet Take 325 mg by mouth every Wednesday.  . fluticasone (FLONASE) 50 MCG/ACT nasal spray Place 1 spray into both nostrils every other day.  Marland Kitchen GLUCOSAMINE-CHONDROITIN PO Take 1 tablet by mouth 2 (two) times daily.   Marland Kitchen ibuprofen (ADVIL,MOTRIN) 200 MG tablet Take 200 mg by mouth every 6 (six) hours as needed for headache or moderate pain.  Marland Kitchen losartan (COZAAR) 25 MG tablet Take 50 mg in the morning and 25 mg in the evening every day.  . Multiple Vitamins-Minerals (CENTRUM ADULTS PO) Take 1 tablet by mouth daily.   . Omega-3 1000 MG CAPS Take 1,000-2,000 mg by mouth See admin instructions. Take 1000 mg by mouth in the morning and take 2000 mg by mouth in the evening after supper  . fexofenadine (ALLEGRA) 180 MG tablet Take 180 mg by mouth daily as needed for allergies.  (Patient not taking: Reported on 04/06/2020)   No facility-administered encounter medications on file as of 04/06/2020.    Allergies (  verified) Ketotifen fumarate, Mixed ragweed, and Triamcinolone   History: Past Medical History:  Diagnosis Date  . AAA (abdominal aortic aneurysm) ([place])   . Allergy   . Arthritis   . Colon polyps   . Coronary artery calcification   . Eczematous dermatitis   . GERD (gastroesophageal reflux disease)   . Hypertension   . Iliac artery stenosis, left ([place])   . Osteopenia    Past Surgical History:  Procedure Laterality  Date  . ABDOMINAL AORTIC ENDOVASCULAR FENESTRATED STENT GRAFT N/A 03/21/2018   Procedure: ABDOMINAL AORTIC ENDOVASCULAR FENESTRATED STENT GRAFT ; [place] CT PERFORMED;  Surgeon: [name], MD;  Location: [facility];  Service: Vascular;  Laterality: N/A;  . Basal Skin Cancer  2012  . CATARACT EXTRACTION Bilateral   . TONSILLECTOMY    . TRANSURETHRAL RESECTION OF PROSTATE     Family History  Problem Relation Age of Onset  . Diabetes Mother   . Hypertension Mother   . Stroke Mother   . Alcohol abuse Father   . Heart attack Father   . Heart disease Father   . Hypertension Father   . Cancer Sister   . Hyperlipidemia Sister   . Pancreatic cancer Sister   . Pancreatic cancer Brother   . Hypertension Son    Social History   Socioeconomic History  . Marital status: Divorced    Spouse name: Not on file  . Number of children: 1  . Years of education: Not on file  . Highest education level: Not on file  Occupational History  . Occupation: Retired    Fish farm manager: [employer]  Tobacco Use  . Smoking status: Former Smoker    Packs/day: 1.00    Years: 32.00    Pack years: 32.00    Types: Cigarettes    Quit date: 08/17/1990    Years since quitting: 29.6  . Smokeless tobacco: Never Used  Vaping Use  . Vaping Use: Never used  Substance and Sexual Activity  . Alcohol use: Yes    Comment: Occassionally, "when I feel like it"  . Drug use: No  . Sexual activity: Yes    Partners: Female  Other Topics Concern  . Not on file  Social History Narrative  . Not on file   Social Determinants of Health   Financial Resource Strain: Low Risk   . Difficulty of Paying Living Expenses: Not hard at all  Food Insecurity: No Food Insecurity  . Worried About Charity fundraiser in the Last Year: Never true  . Ran Out of Food in the Last Year: Never true  Transportation Needs: No Transportation Needs  . Lack of Transportation (Medical): No  . Lack of Transportation (Non-Medical): No  Physical  Activity: Insufficiently Active  . Days of Exercise per Week: 5 days  . Minutes of Exercise per Session: 20 min  Stress: No Stress Concern Present  . Feeling of Stress : Not at all  Social Connections: Moderately Isolated  . Frequency of Communication with Friends and Family: Once a week  . Frequency of Social Gatherings with Friends and Family: Twice a week  . Attends Religious Services: Never  . Active Member of Clubs or Organizations: Yes  . Attends Archivist Meetings: 1 to 4 times per year  . Marital Status: Divorced    Tobacco Counseling Counseling given: Not Answered   Clinical Intake:     Pain : No/denies pain     BMI - recorded: 27.91 Nutritional Status: BMI  25 -29 Overweight Diabetes: No  How often do you need to have someone help you when you read instructions, pamphlets, or other written materials from your doctor or pharmacy?: 1 - Never  Diabetic?  Interpreter Needed?: No  Information entered by :: Charlott Rakes ,LPN   Activities of Daily Living In your present state of health, do you have any difficulty performing the following activities: 04/06/2020  Hearing? Y  Comment left ear mild loss  Vision? N  Difficulty concentrating or making decisions? N  Walking or climbing stairs? N  Dressing or bathing? N  Doing errands, shopping? N  Preparing Food and eating ? N  Using the Toilet? N  In the past six months, have you accidently leaked urine? N  Do you have problems with loss of bowel control? N  Managing your Medications? N  Managing your Finances? N  Housekeeping or managing your Housekeeping? N  Some recent data might be hidden    Patient Care Team: Vivi Barrack, MD as PCP - General (Family Medicine) Buford Dresser, MD as PCP - Cardiology (Cardiology) Chevis Pretty as Consulting Physician (Dentistry) [name], MD as Consulting Physician (Vascular Surgery) Warren Danes, PA-C as Physician Assistant  (Dermatology)  Indicate any recent Medical Services you may have received from other than [facility] providers in the past year (date may be approximate).     Assessment:   This is a routine wellness examination for [facility].  Hearing/Vision screen  Hearing Screening   125Hz  250Hz  500Hz  1000Hz  2000Hz  3000Hz  4000Hz  6000Hz  8000Hz   Right ear:           Left ear:           Comments: Pt states left side loss only   Vision Screening Comments: Pt follows up annually with Dr Alois Cliche  Dietary issues and exercise activities discussed: Current Exercise Habits: Home exercise routine, Type of exercise: walking, Time (Minutes): 25  Goals    . Patient Stated    . Weight (lb) < 200 lb (90.7 kg)      Depression Screen PHQ 2/9 Scores 04/06/2020 03/05/2019 02/28/2018 02/27/2017 02/24/2017  PHQ - 2 Score 0 0 0 0 0    Fall Risk Fall Risk  04/06/2020 03/05/2019 02/28/2018 02/27/2017 02/24/2017  Falls in the past year? 0 0 No Yes -  Number falls in past yr: 0 0 - 1 1  Injury with Fall? 0 0 - No No  Risk for fall due to : Impaired balance/gait;Impaired mobility;Impaired vision - - - -  Follow up Falls prevention discussed Education provided;Falls evaluation completed - Education provided -    Any stairs in or around the home? Yes  If so, are there any without handrails? No  Home free of loose throw rugs in walkways, pet beds, electrical cords, etc? Yes  Adequate lighting in your home to reduce risk of falls? Yes   ASSISTIVE DEVICES UTILIZED TO PREVENT FALLS:  Life alert? No  Use of a cane, walker or w/c? No  Grab bars in the bathroom? Yes  Shower chair or bench in shower? Yes  Elevated toilet seat or a handicapped toilet? No   TIMED UP AND GO:  Was the test performed? No     Cognitive Function: MMSE - Mini Mental State Exam 02/27/2017  Orientation to time 5  Orientation to Place 5  Registration 3  Attention/ Calculation 3  Recall 2  Language- name 2 objects 2  Language- repeat 1  Language-  follow 3  step command 3  Language- read & follow direction 1  Write a sentence 1  Copy design 1  Total score 27     6CIT Screen 04/06/2020 03/05/2019 02/28/2018  What Year? 0 points 0 points 0 points  What month? 0 points 0 points 0 points  What time? - 0 points 0 points  Count back from 20 0 points 0 points 0 points  Months in reverse 0 points 0 points 0 points  Repeat phrase 0 points 2 points 10 points  Total Score - 2 10    Immunizations Immunization History  Administered Date(s) Administered  . Fluad Quad(high Dose 65+) 03/26/2019  . Influenza, High Dose Seasonal PF 04/26/2017, 05/05/2018  . PFIZER SARS-COV-2 Vaccination 07/01/2019, 07/22/2019  . Pneumococcal Conjugate-13 09/24/2014  . Pneumococcal Polysaccharide-23 04/26/2017  . Tdap 05/05/2018  . Zoster Recombinat (Shingrix) 03/09/2019, 05/14/2019    TDAP status: Up to date Flu Vaccine status: Declined, Education has been provided regarding the importance of this vaccine but patient still declined. Advised may receive this vaccine at local pharmacy or Health Dept. Aware to provide a copy of the vaccination record if obtained from local pharmacy or Health Dept. Verbalized acceptance and understanding. pt stated he want Flu vaccine at appt 04/09/20 office visit  Pneumococcal vaccine status: Up to date Covid-19 vaccine status: Completed vaccines  Qualifies for Shingles Vaccine? Yes   Zostavax completed Yes   Shingrix Completed?: Yes  Screening Tests Health Maintenance  Topic Date Due  . Hepatitis C Screening  Never done  . INFLUENZA VACCINE  01/19/2020  . TETANUS/TDAP  05/05/2028  . COVID-19 Vaccine  Completed  . PNA vac Low Risk Adult  Completed    Health Maintenance  Health Maintenance Due  Topic Date Due  . Hepatitis C Screening  Never done  . INFLUENZA VACCINE  01/19/2020    Colorectal cancer screening: No longer required.     Additional Screening:  Hepatitis C Screening: does qualify  Vision  Screening: Recommended annual ophthalmology exams for early detection of glaucoma and other disorders of the eye. Is the patient up to date with their annual eye exam?  Yes  Who is the provider or what is the name of the office in which the patient attends annual eye exams? Dr Alois Cliche    Dental Screening: Recommended annual dental exams for proper oral hygiene  Community Resource Referral / Chronic Care Management: CRR required this visit?  No   CCM required this visit?  No      Plan:     I have personally reviewed and noted the following in the patient's chart:   . Medical and social history . Use of alcohol, tobacco or illicit drugs  . Current medications and supplements . Functional ability and status . Nutritional status . Physical activity . Advanced directives . List of other physicians . Hospitalizations, surgeries, and ER visits in previous 12 months . Vitals . Screenings to include cognitive, depression, and falls . Referrals and appointments  In addition, I have reviewed and discussed with patient certain preventive protocols, quality metrics, and best practice recommendations. A written personalized care plan for preventive services as well as general preventive health recommendations were provided to patient.     Willette Brace, LPN   [id number]   Nurse Notes: Pt wants to receive Flu vaccine on 04/09/20 Office visit, pt states he has been concerned with BP and will bring in Log of Blood pressures. Pt states he was recently placed on Amlodipine  and notice it has been effect. However wanted to discuss with Dr [name] as well.

## 2020-04-07 ENCOUNTER — Encounter: Payer: Self-pay | Admitting: Family Medicine

## 2020-04-07 ENCOUNTER — Telehealth: Payer: Self-pay

## 2020-04-09 ENCOUNTER — Other Ambulatory Visit: Payer: Self-pay

## 2020-04-09 ENCOUNTER — Encounter: Payer: Self-pay | Admitting: Family Medicine

## 2020-04-09 ENCOUNTER — Ambulatory Visit (INDEPENDENT_AMBULATORY_CARE_PROVIDER_SITE_OTHER): Payer: Medicare Other | Admitting: Family Medicine

## 2020-04-09 VITALS — BP 136/69 | HR 60 | Temp 98.5°F | Ht 69.0 in | Wt 192.6 lb

## 2020-04-09 DIAGNOSIS — Z0001 Encounter for general adult medical examination with abnormal findings: Secondary | ICD-10-CM

## 2020-04-09 DIAGNOSIS — E785 Hyperlipidemia, unspecified: Secondary | ICD-10-CM

## 2020-04-09 DIAGNOSIS — R739 Hyperglycemia, unspecified: Secondary | ICD-10-CM

## 2020-04-09 DIAGNOSIS — I10 Essential (primary) hypertension: Secondary | ICD-10-CM | POA: Diagnosis not present

## 2020-04-09 DIAGNOSIS — Z23 Encounter for immunization: Secondary | ICD-10-CM

## 2020-04-09 DIAGNOSIS — M542 Cervicalgia: Secondary | ICD-10-CM

## 2020-04-09 DIAGNOSIS — G8929 Other chronic pain: Secondary | ICD-10-CM | POA: Insufficient documentation

## 2020-04-09 NOTE — Assessment & Plan Note (Signed)
Longstanding problem.  [place] out of tree about 15 years ago has had chronic issues since then.  Was told that he had "no disc space location the past."  Does not want to have surgery no symptoms continue to be problematic.  Will place referral to sports medicine for further evaluation/management.

## 2020-04-09 NOTE — Assessment & Plan Note (Signed)
Lipitor 40 mg daily.  Check lipid panel today.

## 2020-04-09 NOTE — Assessment & Plan Note (Signed)
Check A1c. 

## 2020-04-09 NOTE — Patient Instructions (Signed)
It was very nice to see you today!  We will check blood work today.  We have your flu vaccine today.  I will place a referral for you to see the sports medicine physicians for your neck.  I will see back in year.  Please come back to see me sooner if needed.  Take care, Dr [name]  Please try these tips to maintain a healthy lifestyle:   Eat at least 3 REAL meals and 1-2 snacks per day.  Aim for no more than 5 hours between eating.  If you eat breakfast, please do so within one hour of getting up.    Each meal should contain half fruits/vegetables, one quarter protein, and one quarter carbs (no bigger than a computer mouse)   Cut down on sweet beverages. This includes juice, soda, and sweet tea.     Drink at least 1 glass of water with each meal and aim for at least 8 glasses per day   Exercise at least 150 minutes every week.    Preventive Care 79 Years and Older, Male Preventive care refers to lifestyle choices and visits with your health care provider that can promote health and wellness. This includes:  A yearly physical exam. This is also called an annual well check.  Regular dental and eye exams.  Immunizations.  Screening for certain conditions.  Healthy lifestyle choices, such as diet and exercise. What can I expect for my preventive care visit? Physical exam Your health care provider will check:  Height and weight. These may be used to calculate body mass index (BMI), which is a measurement that tells if you are at a healthy weight.  Heart rate and blood pressure.  Your skin for abnormal spots. Counseling Your health care provider may ask you questions about:  Alcohol, tobacco, and drug use.  Emotional well-being.  Home and relationship well-being.  Sexual activity.  Eating habits.  History of falls.  Memory and ability to understand (cognition).  Work and work Statistician. What immunizations do I need?  Influenza (flu) vaccine  This  is recommended every year. Tetanus, diphtheria, and pertussis (Tdap) vaccine  You may need a Td booster every 10 years. Varicella (chickenpox) vaccine  You may need this vaccine if you have not already been vaccinated. Zoster (shingles) vaccine  You may need this after age 79. Pneumococcal conjugate (PCV13) vaccine  One dose is recommended after age 79. Pneumococcal polysaccharide (PPSV23) vaccine  One dose is recommended after age 79. Measles, mumps, and rubella (MMR) vaccine  You may need at least one dose of MMR if you were born in 1957 or later. You may also need a second dose. Meningococcal conjugate (MenACWY) vaccine  You may need this if you have certain conditions. Hepatitis A vaccine  You may need this if you have certain conditions or if you travel or work in places where you may be exposed to hepatitis A. Hepatitis B vaccine  You may need this if you have certain conditions or if you travel or work in places where you may be exposed to hepatitis B. Haemophilus influenzae type b (Hib) vaccine  You may need this if you have certain conditions. You may receive vaccines as individual doses or as more than one vaccine together in one shot (combination vaccines). Talk with your health care provider about the risks and benefits of combination vaccines. What tests do I need? Blood tests  Lipid and cholesterol levels. These may be checked every 5 years, or more  frequently depending on your overall health.  Hepatitis C test.  Hepatitis B test. Screening  Lung cancer screening. You may have this screening every year starting at age 79 if you have a 30-pack-year history of smoking and currently smoke or have quit within the past 15 years.  Colorectal cancer screening. All adults should have this screening starting at age 79 and continuing until age 79. Your health care provider may recommend screening at age 79 if you are at increased risk. You will have tests every 1-10  years, depending on your results and the type of screening test.  Prostate cancer screening. Recommendations will vary depending on your family history and other risks.  Diabetes screening. This is done by checking your blood sugar (glucose) after you have not eaten for a while (fasting). You may have this done every 1-3 years.  Abdominal aortic aneurysm (AAA) screening. You may need this if you are a current or former smoker.  Sexually transmitted disease (STD) testing. Follow these instructions at home: Eating and drinking  Eat a diet that includes fresh fruits and vegetables, whole grains, lean protein, and low-fat dairy products. Limit your intake of foods with high amounts of sugar, saturated fats, and salt.  Take vitamin and mineral supplements as recommended by your health care provider.  Do not drink alcohol if your health care provider tells you not to drink.  If you drink alcohol: ? Limit how much you have to 0-2 drinks a day. ? Be aware of how much alcohol is in your drink. In the [place] one drink equals one 12 oz bottle of beer (355 mL), one 5 oz glass of wine (148 mL), or one 1 oz glass of hard liquor (44 mL). Lifestyle  Take daily care of your teeth and gums.  Stay active. Exercise for at least 30 minutes on 5 or more days each week.  Do not use any products that contain nicotine or tobacco, such as cigarettes, e-cigarettes, and chewing tobacco. If you need help quitting, ask your health care provider.  If you are sexually active, practice safe sex. Use a condom or other form of protection to prevent STIs (sexually transmitted infections).  Talk with your health care provider about taking a low-dose aspirin or statin. What's next?  Visit your health care provider once a year for a well check visit.  Ask your health care provider how often you should have your eyes and teeth checked.  Stay up to date on all vaccines. This information is not intended to replace  advice given to you by your health care provider. Make sure you discuss any questions you have with your health care provider. Document Revised: 05/31/2018 Document Reviewed: 05/31/2018 Elsevier Patient Education  2020 [employer].

## 2020-04-09 NOTE — Assessment & Plan Note (Signed)
At goal.  Continue losartan 75 mg total daily and amlodipine 5 mg daily.

## 2020-04-09 NOTE — Progress Notes (Signed)
Chief Complaint:  Jeffrey [name] is a 79 y.o. male who presents today for his annual comprehensive physical exam.    Assessment/Plan:  Chronic Problems Addressed Today: HLD (hyperlipidemia) Lipitor 40 mg daily.  Check lipid panel today.  Essential hypertension At goal.  Continue losartan 75 mg total daily and amlodipine 5 mg daily.  Chronic neck pain Longstanding problem.  [place] out of tree about 15 years ago has had chronic issues since then.  Was told that he had "no disc space location the past."  Does not want to have surgery no symptoms continue to be problematic.  Will place referral to sports medicine for further evaluation/management.  Hyperglycemia Check A1c.  Preventative Healthcare: Flu vaccine given today.  Due for colonoscopy next year.  Check CBC, CMET, TSH, lipid panel.  Up-to-date on Covid vaccine.  Patient Counseling(The following topics were reviewed and/or handout was given):  -Nutrition: Stressed importance of moderation in sodium/caffeine intake, saturated fat and cholesterol, caloric balance, sufficient intake of fresh fruits, vegetables, and fiber.  -Stressed the importance of regular exercise.   -Substance Abuse: Discussed cessation/primary prevention of tobacco, alcohol, or other drug use; driving or other dangerous activities under the influence; availability of treatment for abuse.   -Injury prevention: Discussed safety belts, safety helmets, smoke detector, smoking near bedding or upholstery.   -Sexuality: Discussed sexually transmitted diseases, partner selection, use of condoms, avoidance of unintended pregnancy and contraceptive alternatives.   -Dental health: Discussed importance of regular tooth brushing, flossing, and dental visits.  -Health maintenance and immunizations reviewed. Please refer to Health maintenance section.  Return to care in 1 year for next preventative visit.     Subjective:  HPI:  He has no acute complaints today.    Lifestyle Diet: Balanced. Plenty of fruits and vegetables.  Exercise: Likes walking.   Depression screen PHQ 2/9 04/06/2020  Decreased Interest 0  Down, Depressed, Hopeless 0  PHQ - 2 Score 0    Health Maintenance Due  Topic Date Due   Hepatitis C Screening  Never done     ROS: Per HPI, otherwise a complete review of systems was negative.   PMH:  The following were reviewed and entered/updated in epic: Past Medical History:  Diagnosis Date   AAA (abdominal aortic aneurysm) (HCC)    Allergy    Arthritis    Colon polyps    Coronary artery calcification    Eczematous dermatitis    GERD (gastroesophageal reflux disease)    Hypertension    Iliac artery stenosis, left (HCC)    Osteopenia    Patient Active Problem List   Diagnosis Date Noted   Hyperglycemia 04/09/2020   Chronic neck pain 04/09/2020   Coronary artery disease due to calcified coronary lesion 12/27/2018   Osteopenia 02/24/2017   Abdominal aortic aneurysm (AAA) without rupture ([place]) 07/06/2015   Basal cell carcinoma 07/06/2015   Essential hypertension 07/06/2015   History of colon polyps 07/06/2015   HLD (hyperlipidemia) 07/06/2015   Past Surgical History:  Procedure Laterality Date   ABDOMINAL AORTIC ENDOVASCULAR FENESTRATED STENT GRAFT N/A 03/21/2018   Procedure: ABDOMINAL AORTIC ENDOVASCULAR FENESTRATED STENT GRAFT ; [place] [facility] PERFORMED;  Surgeon: [name], MD;  Location: [facility] OR;  Service: Vascular;  Laterality: N/A;   Basal Skin Cancer  2012   CATARACT EXTRACTION Bilateral    TONSILLECTOMY     TRANSURETHRAL RESECTION OF PROSTATE      Family History  Problem Relation Age of Onset   Diabetes Mother  Hypertension Mother    Stroke Mother    Alcohol abuse Father    Heart attack Father    Heart disease Father    Hypertension Father    Cancer Sister    Hyperlipidemia Sister    Pancreatic cancer Sister    Pancreatic cancer Brother    Hypertension Son      Medications- reviewed and updated Current Outpatient Medications  Medication Sig Dispense Refill   acetaminophen (TYLENOL) 500 MG tablet Take 1,000 mg by mouth every 6 (six) hours as needed for moderate pain or headache.     amLODipine (NORVASC) 5 MG tablet Take 1 tablet (5 mg total) by mouth daily. 90 tablet 3   Ascorbic Acid (VITAMIN C) 1000 MG tablet Take 1,000 mg by mouth daily.      aspirin EC 81 MG tablet Take 81 mg by mouth daily.      atorvastatin (LIPITOR) 40 MG tablet TAKE 1 TABLET(40 MG) BY MOUTH DAILY AT 6 PM 90 tablet 2   Calcium Carbonate-Vitamin D (CALCIUM 600+D) 600-200 MG-UNIT TABS Take 1 tablet by mouth every other day.      Cyanocobalamin (RA VITAMIN B-12 TR) 1000 MCG TBCR Take 1,000 mcg by mouth daily.      Emollient (CERAVE) LOTN Apply 1 application topically daily as needed (after shower care).      ferrous sulfate 325 (65 FE) MG tablet Take 325 mg by mouth every Wednesday.     fexofenadine (ALLEGRA) 180 MG tablet Take 180 mg by mouth daily as needed for allergies.      fluticasone (FLONASE) 50 MCG/ACT nasal spray Place 1 spray into both nostrils every other day.     GLUCOSAMINE-CHONDROITIN PO Take 1 tablet by mouth 2 (two) times daily.      ibuprofen (ADVIL,MOTRIN) 200 MG tablet Take 200 mg by mouth every 6 (six) hours as needed for headache or moderate pain.     losartan (COZAAR) 25 MG tablet Take 50 mg in the morning and 25 mg in the evening every day. 270 tablet 3   Multiple Vitamins-Minerals (CENTRUM ADULTS PO) Take 1 tablet by mouth daily.      Omega-3 1000 MG CAPS Take 1,000-2,000 mg by mouth See admin instructions. Take 1000 mg by mouth in the morning and take 2000 mg by mouth in the evening after supper     No current facility-administered medications for this visit.    Allergies-reviewed and updated Allergies  Allergen Reactions   Ketotifen Fumarate Swelling and Other (See Comments)    Eye swelling   Mixed Ragweed     Hay fever    Triamcinolone Hives and Rash    Social History   Socioeconomic History   Marital status: Divorced    Spouse name: Not on file   Number of children: 1   Years of education: Not on file   Highest education level: Not on file  Occupational History   Occupation: Retired    Fish farm manager: [employer] [place]  Tobacco Use   Smoking status: Former Smoker    Packs/day: 1.00    Years: 32.00    Pack years: 32.00    Types: Cigarettes    Quit date: 08/17/1990    Years since quitting: 29.6   Smokeless tobacco: Never Used  Vaping Use   Vaping Use: Never used  Substance and Sexual Activity   Alcohol use: Yes    Comment: Occassionally, "when I feel like it"   Drug use: No   Sexual activity: Yes  Partners: Female  Other Topics Concern   Not on file  Social History Narrative   Not on file   Social Determinants of Health   Financial Resource Strain: Low Risk    Difficulty of Paying Living Expenses: Not hard at all  Food Insecurity: No Food Insecurity   Worried About Charity fundraiser in the Last Year: Never true   Arboriculturist in the Last Year: Never true  Transportation Needs: No Transportation Needs   Lack of Transportation (Medical): No   Lack of Transportation (Non-Medical): No  Physical Activity: Insufficiently Active   Days of Exercise per Week: 5 days   Minutes of Exercise per Session: 20 min  Stress: No Stress Concern Present   Feeling of Stress : Not at all  Social Connections: Moderately Isolated   Frequency of Communication with Friends and Family: Once a week   Frequency of Social Gatherings with Friends and Family: Twice a week   Attends Religious Services: Never   Marine scientist or Organizations: Yes   Attends Music therapist: 1 to 4 times per year   Marital Status: Divorced        Objective:  Physical Exam: BP 136/69    Pulse 60    Temp 98.5 F (36.9 C) (Temporal)    Ht 5\' 9"  (1.753 m)    Wt 192 lb 9.6 oz (87.4  kg)    SpO2 100%    BMI 28.44 kg/m   Body mass index is 28.44 kg/m. Wt Readings from Last 3 Encounters:  04/09/20 192 lb 9.6 oz (87.4 kg)  03/09/20 189 lb (85.7 kg)  02/03/20 197 lb (89.4 kg)   Gen: NAD, resting comfortably HEENT: TMs normal bilaterally. OP clear. No thyromegaly noted.  CV: RRR with no murmurs appreciated Pulm: NWOB, CTAB with no crackles, wheezes, or rhonchi GI: Normal bowel sounds present. Soft, Nontender, Nondistended. MSK: no edema, cyanosis, or clubbing noted Skin: warm, dry Neuro: CN2-12 grossly intact. Strength 5/5 in upper and lower extremities. Reflexes symmetric and intact bilaterally.  Psych: Normal affect and thought content     Andrya Roppolo M. [name], MD 04/09/2020 2:10 PM

## 2020-04-10 LAB — COMPREHENSIVE METABOLIC PANEL
AG Ratio: 1.4 (calc) (ref 1.0–2.5)
ALT: 16 U/L (ref 9–46)
AST: 21 U/L (ref 10–35)
Albumin: 4.4 g/dL (ref 3.6–5.1)
Alkaline phosphatase (APISO): 90 U/L (ref 35–144)
BUN/Creatinine Ratio: 10 (calc) (ref 6–22)
BUN: 12 mg/dL (ref 7–25)
CO2: 24 mmol/L (ref 20–32)
Calcium: 9.5 mg/dL (ref 8.6–10.3)
Chloride: 97 mmol/L — ABNORMAL LOW (ref 98–110)
Creat: 1.2 mg/dL — ABNORMAL HIGH (ref 0.70–1.18)
Globulin: 3.2 g/dL (calc) (ref 1.9–3.7)
Glucose, Bld: 94 mg/dL (ref 65–99)
Potassium: 5.3 mmol/L (ref 3.5–5.3)
Sodium: 130 mmol/L — ABNORMAL LOW (ref 135–146)
Total Bilirubin: 0.7 mg/dL (ref 0.2–1.2)
Total Protein: 7.6 g/dL (ref 6.1–8.1)

## 2020-04-10 LAB — CBC
HCT: 31.9 % — ABNORMAL LOW (ref 38.5–50.0)
Hemoglobin: 11.2 g/dL — ABNORMAL LOW (ref 13.2–17.1)
MCH: 33.3 pg — ABNORMAL HIGH (ref 27.0–33.0)
MCHC: 35.1 g/dL (ref 32.0–36.0)
MCV: 94.9 fL (ref 80.0–100.0)
MPV: 10 fL (ref 7.5–12.5)
Platelets: 189 10*3/uL (ref 140–400)
RBC: 3.36 10*6/uL — ABNORMAL LOW (ref 4.20–5.80)
RDW: 13.8 % (ref 11.0–15.0)
WBC: 7.5 10*3/uL (ref 3.8–10.8)

## 2020-04-10 LAB — LIPID PANEL
Cholesterol: 123 mg/dL (ref <200)
HDL: 68 mg/dL (ref >=40)
LDL Cholesterol (Calc): 43 mg/dL (calc)
Non-HDL Cholesterol (Calc): 55 mg/dL (calc) (ref <130)
Total CHOL/HDL Ratio: 1.8 (calc) (ref <5.0)
Triglycerides: 50 mg/dL (ref <150)

## 2020-04-10 LAB — HEMOGLOBIN A1C
Hgb A1c MFr Bld: 4.7 % of total Hgb (ref <5.7)
Mean Plasma Glucose: 88 (calc)
eAG (mmol/L): 4.9 (calc)

## 2020-04-10 LAB — TSH: TSH: 1.54 mIU/L (ref 0.40–4.50)

## 2020-04-10 NOTE — Progress Notes (Signed)
Please inform patient of the following:  Kidney and sodium numbers off slightly and he is anemic. Everything else is NORMAL. Would like for him to come back to recheck a few labs. Please place order for BMET, CBC, ferritin and TIBC, and B12.  Algis Greenhouse. [name], MD 04/10/2020 11:38 AM

## 2020-04-13 ENCOUNTER — Ambulatory Visit (INDEPENDENT_AMBULATORY_CARE_PROVIDER_SITE_OTHER): Payer: Medicare Other

## 2020-04-13 ENCOUNTER — Encounter: Payer: Self-pay | Admitting: Family Medicine

## 2020-04-13 ENCOUNTER — Other Ambulatory Visit: Payer: Self-pay | Admitting: Family Medicine

## 2020-04-13 ENCOUNTER — Ambulatory Visit: Payer: Medicare Other | Admitting: Family Medicine

## 2020-04-13 ENCOUNTER — Other Ambulatory Visit: Payer: Self-pay

## 2020-04-13 VITALS — BP 160/80 | HR 60 | Ht 69.0 in | Wt 194.8 lb

## 2020-04-13 DIAGNOSIS — I2510 Atherosclerotic heart disease of native coronary artery without angina pectoris: Secondary | ICD-10-CM | POA: Diagnosis not present

## 2020-04-13 DIAGNOSIS — G8929 Other chronic pain: Secondary | ICD-10-CM | POA: Diagnosis not present

## 2020-04-13 DIAGNOSIS — I10 Essential (primary) hypertension: Secondary | ICD-10-CM

## 2020-04-13 DIAGNOSIS — I714 Abdominal aortic aneurysm, without rupture, unspecified: Secondary | ICD-10-CM

## 2020-04-13 DIAGNOSIS — M545 Low back pain, unspecified: Secondary | ICD-10-CM

## 2020-04-13 DIAGNOSIS — M542 Cervicalgia: Secondary | ICD-10-CM | POA: Diagnosis not present

## 2020-04-13 DIAGNOSIS — M546 Pain in thoracic spine: Secondary | ICD-10-CM | POA: Diagnosis not present

## 2020-04-13 DIAGNOSIS — I2584 Coronary atherosclerosis due to calcified coronary lesion: Secondary | ICD-10-CM

## 2020-04-13 NOTE — Telephone Encounter (Signed)
LAST APPOINTMENT DATE: 04/09/2020   NEXT APPOINTMENT DATE: Visit date not found    LAST REFILL: 02/03/2020  QTY: 270  REF: 3  Last refill by Buford Dresser, MD

## 2020-04-13 NOTE — Progress Notes (Signed)
    Subjective:    CC: Neck pain  I, Wendy Poet, LAT, ATC, am serving as scribe for Dr. Lynne Leader.  HPI: Pt is a 79 y/o male presenting w/ c/o chronic neck pain after falling out of a tree 15 years ago.  He is also reporting pain extending down into his lumbar spine and intermittent L knee pain.  UE/LE numbness/tingling: yes in his L LE in his L thigh that will travel distally to his L lower leg due to a twist in a stent he had for an AAA Aggravating factors: walking; pain at all times;  Treatments tried: IBU; Tylenol arthritis  Pertinent review of Systems: No fevers or chills  Relevant historical information: Aortic aneurysm and CAD.  Status post aortic stent   Objective:    Vitals:   04/13/20 1237  BP: (!) 160/80  Pulse: 60  SpO2: 99%   General: Well Developed, well nourished, and in no acute distress.   MSK: C-spine and T-spine: Thoracic kyphosis and cervical lordosis present. Nontender midline T-spine and C-spine.  Tender palpation bilateral cervical paraspinal musculature. Decreased cervical range of motion to extension otherwise range of motion normal. Upper extremity strength reflexes and sensation equal normal throughout.   Lab and Radiology Results  X-ray images C-spine obtained today personally individually interpreted. 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  Diffuse facet DJD.  No acute fractures. Await formal radiology review  X-ray images of thoracic and lumbar spine and hip and pelvis obtained as part of CT angiogram abdomen and chest on July 10, 2019 personally independently interpreted today.  Dominant findings are chronic unchanged compression fracture at T6 and T7 with thoracic kyphosis.   Impression and Recommendations:    Assessment and Plan: 79 y.o. male with pain predominantly in cervical spine and some pain in thoracic spine paraspinal musculature.  Pain due to multifactorial cause including degenerative disc disease and facet DJD.   Additionally patient has muscle dysfunction.  He should be a good candidate for trial of physical therapy to improve his muscle function and reduce spasm and pain.  Plan to refer to PT.  Also recommend heating pad and TENS unit.  Discussed safety of prescription strength over-the-counter medications such as ibuprofen and Aleve.  Recommend avoiding high dose frequent doses of NSAIDs due to CVD risk.  Recheck back with me in about 6 weeks.  Return sooner if needed.  Precautions reviewed.Marland Kitchen  PDMP not reviewed this encounter. Orders Placed This Encounter  Procedures  . DG Cervical Spine 2 or 3 views    Standing Status:   Future    Number of Occurrences:   1    Standing Expiration Date:   04/13/2021    Order Specific Question:   Reason for Exam (SYMPTOM  OR DIAGNOSIS REQUIRED)    Answer:   eval neck pain    Order Specific Question:   Preferred imaging location?    Answer:   [facility]  . Ambulatory referral to Physical Therapy    Referral Priority:   Routine    Referral Type:   Physical Medicine    Referral Reason:   Specialty Services Required    Requested Specialty:   Physical Therapy   No orders of the defined types were placed in this encounter.   Discussed warning signs or symptoms. Please see discharge instructions. Patient expresses understanding.   The above documentation has been reviewed and is accurate and complete Lynne Leader, M.D.

## 2020-04-13 NOTE — Patient Instructions (Addendum)
Thank you for coming in today.  I've referred you to Physical Therapy.  Let us know if you don't hear from them in one week.  Please get an Xray today before you leave  Recheck in about 6 weeks.  Let me know sooner if you have a problem.   Goal less pain and more ability to do more is reasonable.    TENS UNIT: This is helpful for muscle pain and spasm.   Search and Purchase a TENS 7000 2nd edition at  www.[email or web address].com or [email or web address] It should be less than $30.     TENS unit instructions: Do not shower or bathe with the unit on . Turn the unit off before removing electrodes or batteries . If the electrodes lose stickiness add a drop of water to the electrodes after they are disconnected from the unit and place on plastic sheet. If you continued to have difficulty, call the TENS unit company to purchase more electrodes. . Do not apply lotion on the skin area prior to use. Make sure the skin is clean and dry as this will help prolong the life of the electrodes. . After use, always check skin for unusual red areas, rash or other skin difficulties. If there are any skin problems, does not apply electrodes to the same area. . Never remove the electrodes from the unit by pulling the wires. . Do not use the TENS unit or electrodes other than as directed. . Do not change electrode placement without consultating your therapist or physician. Marland Kitchen Keep 2 fingers with between each electrode. . Wear time ratio is 2:1, on to off times.    For example on for 30 minutes off for 15 minutes and then on for 30 minutes off for 15 minutes

## 2020-04-14 ENCOUNTER — Other Ambulatory Visit: Payer: Self-pay

## 2020-04-14 DIAGNOSIS — D649 Anemia, unspecified: Secondary | ICD-10-CM

## 2020-04-14 DIAGNOSIS — E538 Deficiency of other specified B group vitamins: Secondary | ICD-10-CM

## 2020-04-15 NOTE — Progress Notes (Signed)
X-ray cervical spine shows significant arthritis changes. Radiology can also see calcifications of the carotid artery.  Your vascular surgeon has assessed this with an ultrasound in 2019.

## 2020-04-22 ENCOUNTER — Other Ambulatory Visit: Payer: Self-pay

## 2020-04-22 ENCOUNTER — Encounter: Payer: Self-pay | Admitting: Physical Therapy

## 2020-04-22 ENCOUNTER — Ambulatory Visit: Payer: Medicare Other | Attending: Family Medicine | Admitting: Physical Therapy

## 2020-04-22 DIAGNOSIS — M542 Cervicalgia: Secondary | ICD-10-CM

## 2020-04-22 DIAGNOSIS — M545 Low back pain, unspecified: Secondary | ICD-10-CM | POA: Diagnosis not present

## 2020-04-22 DIAGNOSIS — R262 Difficulty in walking, not elsewhere classified: Secondary | ICD-10-CM

## 2020-04-22 DIAGNOSIS — G8929 Other chronic pain: Secondary | ICD-10-CM | POA: Insufficient documentation

## 2020-04-22 NOTE — Patient Instructions (Signed)
Access Code: [email or web address] URL: [email or web address] Date: 04/22/2020 Prepared by: Amador Cunas  Exercises Supine Bridge - 1 x daily - 7 x weekly - 3 sets - 10 reps Supine Lower Trunk Rotation - 1 x daily - 7 x weekly - 3 sets - 10 reps - 5 sec hold Sit to Stand without Arm Support - 1 x daily - 7 x weekly - 3 sets - 10 reps Seated Upper Trapezius Stretch - 1 x daily - 7 x weekly - 3 sets - 2 reps - 20-30 sec hold Seated Levator Scapulae Stretch - 1 x daily - 7 x weekly - 3 sets - 2 reps - 20-30 sec hold Seated Scapular Retraction - 1 x daily - 7 x weekly - 3 sets - 10 reps - 3 sec hold Shoulder External Rotation and Scapular Retraction with Resistance - 1 x daily - 7 x weekly - 3 sets - 10 reps Corner Pec Major Stretch - 1 x daily - 7 x weekly - 3 sets - 2 reps - 20-30 sec hold

## 2020-04-22 NOTE — Therapy (Signed)
[place]. [place], [place], [address] Phone: [phone]   Fax:  [phone]  Physical Therapy Evaluation  Patient Details  Name: Jeffrey [name] MRN: [id number] Date of Birth: 09-Dec-1940 Referring Provider (PT): Marikay Alar Date: 04/22/2020   PT End of Session - 04/22/20 1430    Visit Number 1    Date for PT Re-Evaluation 06/22/20    PT Start Time 1351    PT Stop Time 1430    PT Time Calculation (min) 39 min    Activity Tolerance Patient tolerated treatment well    Behavior During Therapy [facility] for tasks assessed/performed           Past Medical History:  Diagnosis Date  . AAA (abdominal aortic aneurysm) ([place])   . Allergy   . Arthritis   . Colon polyps   . Coronary artery calcification   . Eczematous dermatitis   . GERD (gastroesophageal reflux disease)   . Hypertension   . Iliac artery stenosis, left ([place])   . Osteopenia     Past Surgical History:  Procedure Laterality Date  . ABDOMINAL AORTIC ENDOVASCULAR FENESTRATED STENT GRAFT N/A 03/21/2018   Procedure: ABDOMINAL AORTIC ENDOVASCULAR FENESTRATED STENT GRAFT ; [place] CT PERFORMED;  Surgeon: [name], MD;  Location: [facility] [place];  Service: Vascular;  Laterality: N/A;  . Basal Skin Cancer  2012  . CATARACT EXTRACTION Bilateral   . TONSILLECTOMY    . TRANSURETHRAL RESECTION OF PROSTATE      There were no vitals filed for this visit.    Subjective Assessment - 04/22/20 1350    Subjective Pt reports chronic cervical pain for the past few decades worsening over the past few years. Pt also reports persistent LBP after falling out of a tree 15 years ago which led to chronic LBP. Pt reports constant neck pain and LBP. Pt denies radiating pain and N/T. Pt reports occasional L leg pain related to hardening of arteries.    Pertinent History abdominal aortic aneurysm with stent, HTN    Limitations Standing;Walking;Lifting    Diagnostic tests xrays     Currently in Pain? Yes    Pain Score 8     Pain Location Neck    Pain Orientation Mid    Pain Descriptors / Indicators Aching;Burning;Sharp    Pain Type Chronic pain    Pain Onset More than a month ago    Pain Frequency Constant    Aggravating Factors  prolonged sitting, end range ROM    Pain Relieving Factors moving neck around, ice, TENs unit    Multiple Pain Sites Yes    Pain Score 6    Pain Location Back    Pain Orientation Lower;Mid    Pain Descriptors / Indicators Aching;Burning;Sharp    Pain Type Chronic pain    Pain Onset More than a month ago    Pain Frequency Constant    Aggravating Factors  prolonged sitting, walking    Pain Relieving Factors rest, stretching              [facility] PT Assessment - 04/22/20 0001      Assessment   Medical Diagnosis cervicalgia and LBP    Referring Provider (PT) Georgina Snell    Next MD Visit 05/25/2020    Prior Therapy none      Precautions   Precautions None      Restrictions   Weight Bearing Restrictions No      Balance Screen  Has the patient fallen in the past 6 months No    Has the patient had a decrease in activity level because of a fear of falling?  No    Is the patient reluctant to leave their home because of a fear of falling?  No      Home Environment   Additional Comments stairs; reports no difficulty with stairs      Prior Function   Level of Independence Independent    Vocation Retired    Leisure walking   reports used to do 5 miles/day now does 1 mile in parts     Functional Tests   Functional tests Sit to Stand      Sit to Stand   Comments rises w/o UEs; some difficulty with eccentric control      Posture/Postural Control   Posture/Postural Control Postural limitations    Postural Limitations Rounded Shoulders;Forward head;Increased thoracic kyphosis    Posture Comments stifness throughout thoracic spine and increased thoracic kyphosis      ROM / Strength   AROM / PROM / Strength AROM;Strength      AROM    Overall AROM Comments cervical ROM 50% limited with pain at end range flexion/lat flexion/rotation; lumbar AROM 25% limited       Strength   Overall Strength Comments BLE 5/5; core weakness       Flexibility   Soft Tissue Assessment /Muscle Length yes    Hamstrings tight    Piriformis tight      Palpation   Palpation comment tender to palpation over central spinous processes of thoracic and cervical spine      Transfers   Five time sit to stand comments  WFL; some difficulty with eccentric control                       Objective measurements completed on examination: See above findings.       [facility] Adult PT Treatment/Exercise - 04/22/20 0001      Exercises   Exercises Lumbar;Neck      Neck Exercises: Theraband   Scapula Retraction 10 reps      Lumbar Exercises: Stretches   Lower Trunk Rotation 5 reps;10 seconds      Lumbar Exercises: Supine   Bridge 10 reps;3 seconds      Neck Exercises: Stretches   Upper Trapezius Stretch Right;Left;1 rep;30 seconds    Levator Stretch Right;Left;1 rep;30 seconds    Corner Stretch 1 rep;30 seconds                  PT Education - 04/22/20 1430    Education Details Pt educated on POC and HEP    Person(s) Educated Patient    Methods Explanation;Demonstration;Handout    Comprehension Verbalized understanding;Returned demonstration            PT Short Term Goals - 04/22/20 1711      PT SHORT TERM GOAL #1   Title Pt will be I with initial HEP    Time 2    Period Weeks    Status New    Target Date 05/06/20             PT Long Term Goals - 04/22/20 1711      PT LONG TERM GOAL #1   Title Pt will be I with advanced HEP    Time 8    Period Weeks    Status New    Target Date 06/17/20  PT LONG TERM GOAL #2   Title Pt will report 50% reduction in LBP and cervical pain    Time 8    Period Weeks    Status New    Target Date 06/17/20      PT LONG TERM GOAL #3   Title Pt will report able to  walk >/= 1 mile with no increase in LBP/cervical pain    Time 8    Period Weeks    Status New    Target Date 06/17/20                  Plan - 04/22/20 1430    Clinical Impression Statement Pt presents to clinic with reports of chronic LBP and chronic cervical pain. Pt denies radiating pain or N/T. Pt demos limited cervical and lumbar ROM, stiffness/hypomobility of cervical/thoracolumbar spine, LE flexibility deficits, FHP and rounded shoulders, and weakness of core/scap stabilizers. Pt was very active walking up to 5 miles per day but now cannot walk 1 mile without frequent rest breaks d/t LBP and cervical pain; pt would like to return to PLOF. Pt would benefit from skilled PT to address the above impairments.    Personal Factors and Comorbidities Comorbidity 2    Comorbidities HTN, abdominal aortic aneurysm    Examination-Activity Limitations Locomotion Level;Lift;Stand;Stairs    Stability/Clinical Decision Making Stable/Uncomplicated    Clinical Decision Making Low    Rehab Potential Good    PT Frequency 2x / week    PT Duration 8 weeks    PT Treatment/Interventions ADLs/Self Care Home Management;Electrical Stimulation;Iontophoresis 4mg /ml Dexamethasone;Moist Heat;Neuromuscular re-education;Therapeutic exercise;Therapeutic activities;Gait training;Stair training;Patient/family education;Manual techniques;Dry needling    PT Next Visit Plan scap stab, cervical ex's, core strength, lumbar ROM/flexibility    PT Home Exercise Plan see pt instructions    Consulted and Agree with Plan of Care Patient           Patient will benefit from skilled therapeutic intervention in order to improve the following deficits and impairments:  Abnormal gait, Difficulty walking, Decreased endurance, Increased muscle spasms, Decreased activity tolerance, Pain, Hypomobility, Impaired flexibility, Decreased strength, Postural dysfunction  Visit Diagnosis: Chronic bilateral low back pain without  sciatica  Difficulty in walking, not elsewhere classified  Cervicalgia     Problem List Patient Active Problem List   Diagnosis Date Noted  . Hyperglycemia 04/09/2020  . Chronic neck pain 04/09/2020  . Coronary artery disease due to calcified coronary lesion 12/27/2018  . Osteopenia 02/24/2017  . Abdominal aortic aneurysm (AAA) without rupture ([place]) 07/06/2015  . Basal cell carcinoma 07/06/2015  . Essential hypertension 07/06/2015  . History of colon polyps 07/06/2015  . HLD (hyperlipidemia) 07/06/2015   Amador Cunas, PT, DPT Donald Prose Kru Allman 04/22/2020, 5:13 PM  [address] [place], [place], [address] Phone: [phone]   Fax:  [phone]  Name: Nichael Ehly MRN: [id number] Date of Birth: [birth year]

## 2020-04-24 ENCOUNTER — Other Ambulatory Visit (HOSPITAL_BASED_OUTPATIENT_CLINIC_OR_DEPARTMENT_OTHER): Payer: Self-pay | Admitting: Internal Medicine

## 2020-04-24 ENCOUNTER — Ambulatory Visit: Payer: Medicare Other | Attending: Internal Medicine

## 2020-04-24 DIAGNOSIS — Z23 Encounter for immunization: Secondary | ICD-10-CM

## 2020-04-24 NOTE — Progress Notes (Signed)
   Covid-19 Vaccination Clinic  Name:  Jeffrey [name]    MRN: [id number] DOB: [birth year]  04/24/2020  [name] was observed post Covid-19 immunization for 15 minutes without incident. He was provided with Vaccine Information Sheet and instruction to access the V-Safe system.   Vaccinated by Unknown Foley  [name] was instructed to call 911 with any severe reactions post vaccine: Marland Kitchen Difficulty breathing  . Swelling of face and throat  . A fast heartbeat  . A bad rash all over body  . Dizziness and weakness

## 2020-04-28 ENCOUNTER — Encounter: Payer: Self-pay | Admitting: Physical Therapy

## 2020-04-28 ENCOUNTER — Other Ambulatory Visit: Payer: Self-pay

## 2020-04-28 ENCOUNTER — Ambulatory Visit: Payer: Medicare Other | Admitting: Physical Therapy

## 2020-04-28 DIAGNOSIS — M545 Low back pain, unspecified: Secondary | ICD-10-CM

## 2020-04-28 DIAGNOSIS — R262 Difficulty in walking, not elsewhere classified: Secondary | ICD-10-CM

## 2020-04-28 DIAGNOSIS — M542 Cervicalgia: Secondary | ICD-10-CM | POA: Diagnosis not present

## 2020-04-28 DIAGNOSIS — G8929 Other chronic pain: Secondary | ICD-10-CM | POA: Diagnosis not present

## 2020-04-28 NOTE — Therapy (Signed)
[place]. [place], [place], [address] Phone: [phone]   Fax:  [phone]  Physical Therapy Treatment  Patient Details  Name: Jeffrey [name] MRN: [id number] Date of Birth: [birth year] Referring Provider (PT): Marikay Alar Date: 04/28/2020   PT End of Session - 04/28/20 1228    Visit Number 2    Date for PT Re-Evaluation 06/22/20    PT Start Time 0350    PT Stop Time 1230    PT Time Calculation (min) 45 min    Activity Tolerance Patient tolerated treatment well    Behavior During Therapy [facility] for tasks assessed/performed           Past Medical History:  Diagnosis Date  . AAA (abdominal aortic aneurysm) ([place])   . Allergy   . Arthritis   . Colon polyps   . Coronary artery calcification   . Eczematous dermatitis   . GERD (gastroesophageal reflux disease)   . Hypertension   . Iliac artery stenosis, left ([place])   . Osteopenia     Past Surgical History:  Procedure Laterality Date  . ABDOMINAL AORTIC ENDOVASCULAR FENESTRATED STENT GRAFT N/A 03/21/2018   Procedure: ABDOMINAL AORTIC ENDOVASCULAR FENESTRATED STENT GRAFT ; [place] CT PERFORMED;  Surgeon: [name], MD;  Location: [place];  Service: Vascular;  Laterality: N/A;  . Basal Skin Cancer  2012  . CATARACT EXTRACTION Bilateral   . TONSILLECTOMY    . TRANSURETHRAL RESECTION OF PROSTATE      There were no vitals filed for this visit.   Subjective Assessment - 04/28/20 1144    Subjective Patient reports to the clinic today with decreased back/neck pain from previous PT treatment. Feels like what we're doing at PT "must be working". Did not have to take any pain medication the last 4 days    Currently in Pain? No/denies                             [facility] Adult PT Treatment/Exercise - 04/28/20 0001      Neck Exercises: Theraband   Scapula Retraction 10 reps;Red    Horizontal ADduction 10 reps;Red      Neck Exercises: Supine    Neck Retraction 10 reps;3 secs      Lumbar Exercises: Stretches   Passive Hamstring Stretch 2 reps;30 seconds;Right;Left   with strap    Single Knee to Chest Stretch 3 reps;20 seconds    Lower Trunk Rotation --   10 reps 5 sec.    Gastroc Stretch 20 seconds;3 reps   at step    Other Lumbar Stretch Exercise Standing T-spine rot at wall x4 ea side       Lumbar Exercises: Aerobic   Nustep L4 6 mins      Lumbar Exercises: Supine   Bridge 10 reps;3 seconds      Neck Exercises: Stretches   Upper Trapezius Stretch Right;Left;5 reps;10 seconds   opposite hand under leg for extra stretch                   PT Short Term Goals - 04/28/20 1232      PT SHORT TERM GOAL #1   Title Pt will be I with initial HEP    Status Achieved             PT Long Term Goals - 04/22/20 1711      PT LONG TERM GOAL #1  Title Pt will be I with advanced HEP    Time 8    Period Weeks    Status New    Target Date 06/17/20      PT LONG TERM GOAL #2   Title Pt will report 50% reduction in LBP and cervical pain    Time 8    Period Weeks    Status New    Target Date 06/17/20      PT LONG TERM GOAL #3   Title Pt will report able to walk >/= 1 mile with no increase in LBP/cervical pain    Time 8    Period Weeks    Status New    Target Date 06/17/20                 Plan - 04/28/20 1229    Clinical Impression Statement Patient has been doing his HEP everyday since last treatment. He had a couple questions about certain exercises that he had been given at the eval and we were able to address them during the session today. Patient would continue to benefit from PT to address hypomobility/stiffness of cervical/thoracolumbar spine, flexibility deficits, and lumbar (core) stabilization weakness. Required a lof of affirmation from PTA that he was performing all exercises correctly.    PT Treatment/Interventions ADLs/Self Care Home Management;Electrical Stimulation;Iontophoresis 4mg /ml  Dexamethasone;Moist Heat;Neuromuscular re-education;Therapeutic exercise;Therapeutic activities;Gait training;Stair training;Patient/family education;Manual techniques;Dry needling    PT Next Visit Plan Incorporate more core stability exercises           Patient will benefit from skilled therapeutic intervention in order to improve the following deficits and impairments:  Abnormal gait, Difficulty walking, Decreased endurance, Increased muscle spasms, Decreased activity tolerance, Pain, Hypomobility, Impaired flexibility, Decreased strength, Postural dysfunction  Visit Diagnosis: Chronic bilateral low back pain without sciatica  Difficulty in walking, not elsewhere classified  Cervicalgia     Problem List Patient Active Problem List   Diagnosis Date Noted  . Hyperglycemia 04/09/2020  . Chronic neck pain 04/09/2020  . Coronary artery disease due to calcified coronary lesion 12/27/2018  . Osteopenia 02/24/2017  . Abdominal aortic aneurysm (AAA) without rupture ([place]) 07/06/2015  . Basal cell carcinoma 07/06/2015  . Essential hypertension 07/06/2015  . History of colon polyps 07/06/2015  . HLD (hyperlipidemia) 07/06/2015    Lavenia Atlas, SPTA 04/28/2020, 12:34 PM  [place]. [place], [place], [address] Phone: [phone]   Fax:  [phone]  Name: Jeffrey [name] MRN: [id number] Date of Birth: 1940/08/10

## 2020-04-30 MED FILL — PFIZER-BIONTECH COVID-19 VA: 30 | 1 days supply | Qty: 0 | Fill #0

## 2020-05-01 ENCOUNTER — Ambulatory Visit: Payer: Medicare Other | Admitting: Physician Assistant

## 2020-05-06 ENCOUNTER — Other Ambulatory Visit: Payer: Self-pay

## 2020-05-06 ENCOUNTER — Ambulatory Visit: Payer: Medicare Other | Admitting: Physical Therapy

## 2020-05-06 ENCOUNTER — Encounter: Payer: Self-pay | Admitting: Physical Therapy

## 2020-05-06 DIAGNOSIS — M542 Cervicalgia: Secondary | ICD-10-CM

## 2020-05-06 DIAGNOSIS — R262 Difficulty in walking, not elsewhere classified: Secondary | ICD-10-CM | POA: Diagnosis not present

## 2020-05-06 DIAGNOSIS — G8929 Other chronic pain: Secondary | ICD-10-CM

## 2020-05-06 DIAGNOSIS — M545 Low back pain, unspecified: Secondary | ICD-10-CM | POA: Diagnosis not present

## 2020-05-06 NOTE — Therapy (Signed)
[address]. [place], [place], [address] Phone: [phone]   Fax:  [phone]  Physical Therapy Treatment  Patient Details  Name: Jeffrey [name] MRN: [id number] Date of Birth: Dec 09, 1940 Referring Provider (PT): Marikay Alar Date: 05/06/2020   PT End of Session - 05/06/20 1143    Visit Number 3    Date for PT Re-Evaluation 06/22/20    PT Start Time 1100    PT Stop Time 1143    PT Time Calculation (min) 43 min    Activity Tolerance Patient tolerated treatment well    Behavior During Therapy [facility] for tasks assessed/performed           Past Medical History:  Diagnosis Date  . AAA (abdominal aortic aneurysm) ([place])   . Allergy   . Arthritis   . Colon polyps   . Coronary artery calcification   . Eczematous dermatitis   . GERD (gastroesophageal reflux disease)   . Hypertension   . Iliac artery stenosis, left ([place])   . Osteopenia     Past Surgical History:  Procedure Laterality Date  . ABDOMINAL AORTIC ENDOVASCULAR FENESTRATED STENT GRAFT N/A 03/21/2018   Procedure: ABDOMINAL AORTIC ENDOVASCULAR FENESTRATED STENT GRAFT ; [place] CT PERFORMED;  Surgeon: [name], MD;  Location: [facility] [place] [facility];  Service: Vascular;  Laterality: N/A;  . Basal Skin Cancer  2012  . CATARACT EXTRACTION Bilateral   . TONSILLECTOMY    . TRANSURETHRAL RESECTION OF PROSTATE      There were no vitals filed for this visit.   Subjective Assessment - 05/06/20 1056    Subjective Doing ok overall, always has back pain, some days are worst than others    Currently in Pain? Yes    Pain Score 5     Pain Location Back                             [facility] Adult PT Treatment/Exercise - 05/06/20 0001      Neck Exercises: Machines for Strengthening   Other Machines for Strengthening Rows and lats 25lb 2x10       Neck Exercises: Standing   Other Standing Exercises Shrugs 6lb levator stretch       Neck Exercises: Seated    Neck Retraction 20 reps;3 secs    Other Seated Exercise ER red 2x10       Lumbar Exercises: Stretches   Passive Hamstring Stretch Right;Left;4 reps;10 seconds    Other Lumbar Stretch Exercise Standing T-spine half foam roll on wall with W backs       Lumbar Exercises: Aerobic   Nustep L4 6 mins      Lumbar Exercises: Standing   Shoulder Extension 20 reps;Both;Power Tower    Shoulder Extension Limitations 10      Lumbar Exercises: Supine   Bridge 10 reps;3 seconds    Other Supine Lumbar Exercises LE on Pball bridges, K2C, oblq                     PT Short Term Goals - 04/28/20 1232      PT SHORT TERM GOAL #1   Title Pt will be I with initial HEP    Status Achieved             PT Long Term Goals - 05/06/20 1144      PT LONG TERM GOAL #1   Title Pt will be I  with advanced HEP    Status Partially Met      PT LONG TERM GOAL #2   Title Pt will report 50% reduction in LBP and cervical pain    Status On-going                 Plan - 05/06/20 1144    Clinical Impression Statement Pt did well with a progressed treatment session. Tactile cues given for posture with standing shoulder and hip extensions. Some coe weakness noted with supine interventions with LE on pball. He reports no increase in pain throughout session. He is has a stiff trunk and LEs .    Personal Factors and Comorbidities Comorbidity 2    Comorbidities HTN, abdominal aortic aneurysm    Examination-Activity Limitations Locomotion Level;Lift;Stand;Stairs    Stability/Clinical Decision Making Stable/Uncomplicated    Rehab Potential Good    PT Frequency 2x / week    PT Duration 8 weeks    PT Treatment/Interventions ADLs/Self Care Home Management;Electrical Stimulation;Iontophoresis 69m/ml Dexamethasone;Moist Heat;Neuromuscular re-education;Therapeutic exercise;Therapeutic activities;Gait training;Stair training;Patient/family education;Manual techniques;Dry needling    PT Next Visit Plan  Incorporate more core stability exercises           Patient will benefit from skilled therapeutic intervention in order to improve the following deficits and impairments:  Abnormal gait, Difficulty walking, Decreased endurance, Increased muscle spasms, Decreased activity tolerance, Pain, Hypomobility, Impaired flexibility, Decreased strength, Postural dysfunction  Visit Diagnosis: Difficulty in walking, not elsewhere classified  Chronic bilateral low back pain without sciatica  Cervicalgia     Problem List Patient Active Problem List   Diagnosis Date Noted  . Hyperglycemia 04/09/2020  . Chronic neck pain 04/09/2020  . Coronary artery disease due to calcified coronary lesion 12/27/2018  . Osteopenia 02/24/2017  . Abdominal aortic aneurysm ([place]) [place] rupture ([place] 07/06/2015  . Basal cell carcinoma 07/06/2015  . Essential hypertension 07/06/2015  . History of colon polyps 07/06/2015  . HLD (hyperlipidemia) 07/06/2015    RScot Jun PTA 05/06/2020, 11:48 AM  [place] [address]Phone: [phone]  Fax:  [phone] Name: TTico CrotteauMRN: [id number] of Birth: 812/05/1941

## 2020-05-08 ENCOUNTER — Other Ambulatory Visit: Payer: Self-pay

## 2020-05-08 ENCOUNTER — Ambulatory Visit: Payer: Medicare Other | Admitting: Physical Therapy

## 2020-05-08 DIAGNOSIS — M545 Low back pain, unspecified: Secondary | ICD-10-CM | POA: Diagnosis not present

## 2020-05-08 DIAGNOSIS — M542 Cervicalgia: Secondary | ICD-10-CM

## 2020-05-08 DIAGNOSIS — R262 Difficulty in walking, not elsewhere classified: Secondary | ICD-10-CM | POA: Diagnosis not present

## 2020-05-08 DIAGNOSIS — G8929 Other chronic pain: Secondary | ICD-10-CM

## 2020-05-08 NOTE — Therapy (Signed)
[place]. [place], [place], [address] Phone: [phone]   Fax:  [phone]  Physical Therapy Treatment  Patient Details  Name: Jeffrey [name] MRN: [id number] Date of Birth: 02/11/1941 Referring Provider (PT): Marikay Alar Date: 05/08/2020   PT End of Session - 05/08/20 1133    Visit Number 4    Date for PT Re-Evaluation 06/22/20    PT Start Time 1050    PT Stop Time 1138    PT Time Calculation (min) 48 min           Past Medical History:  Diagnosis Date  . AAA (abdominal aortic aneurysm) ([place])   . Allergy   . Arthritis   . Colon polyps   . Coronary artery calcification   . Eczematous dermatitis   . GERD (gastroesophageal reflux disease)   . Hypertension   . Iliac artery stenosis, left ([place])   . Osteopenia     Past Surgical History:  Procedure Laterality Date  . ABDOMINAL AORTIC ENDOVASCULAR FENESTRATED STENT GRAFT N/A 03/21/2018   Procedure: ABDOMINAL AORTIC ENDOVASCULAR FENESTRATED STENT GRAFT ; [place] CT PERFORMED;  Surgeon: [name], MD;  Location: [place];  Service: Vascular;  Laterality: N/A;  . Basal Skin Cancer  2012  . CATARACT EXTRACTION Bilateral   . TONSILLECTOMY    . TRANSURETHRAL RESECTION OF PROSTATE      There were no vitals filed for this visit.   Subjective Assessment - 05/08/20 1049    Subjective " the usual" I think Pt is helping some    Currently in Pain? Yes    Pain Score 4     Pain Location Back    Pain Orientation Mid                             [facility] Adult PT Treatment/Exercise - 05/08/20 0001      Neck Exercises: Machines for Strengthening   UBE (Upper Arm Bike) L 2 2 fwd/2 back      Neck Exercises: Standing   Neck Retraction 15 reps;3 secs   head on ball on wall   Wall Push Ups 10 reps   with ball   Other Standing Exercises Shrugs 6lb levator stretch    6# backward rolls 10x   Other Standing Exercises postural wall angels 10x       Lumbar Exercises: Aerobic   Nustep L 5 6 min      Lumbar Exercises: Machines for Strengthening   Cybex Lumbar Extension black tband 2 sets 10    Other Lumbar Machine Exercise row and lats 20# 2 sets 10      Lumbar Exercises: Standing   Other Standing Lumbar Exercises hip 3 way red tband 10 reps      Lumbar Exercises: Seated   Sit to Stand 10 reps   wt ball press     Lumbar Exercises: Supine   Bridge 10 reps;3 seconds   feet on ball and then obliques     Manual Therapy   Manual Therapy Passive ROM    Manual therapy comments tightness throughout    Passive ROM LE and trunk, shld/chest                    PT Short Term Goals - 04/28/20 1232      PT SHORT TERM GOAL #1   Title Pt will be I with initial HEP  Status Achieved             PT Long Term Goals - 05/06/20 1144      PT LONG TERM GOAL #1   Title Pt will be I with advanced HEP    Status Partially Met      PT LONG TERM GOAL #2   Title Pt will report 50% reduction in LBP and cervical pain    Status On-going                 Plan - 05/08/20 1134    Clinical Impression Statement pt tolerated todays interventions well but did need freq verb and tactile cuin to decreased compensations with ex. pt very tight with PROM in LE an dthrough trunk and respnded well to PROM and strteches    PT Treatment/Interventions ADLs/Self Care Home Management;Electrical Stimulation;Iontophoresis 55m/ml Dexamethasone;Moist Heat;Neuromuscular re-education;Therapeutic exercise;Therapeutic activities;Gait training;Stair training;Patient/family education;Manual techniques;Dry needling    PT Next Visit Plan Incorporate more core stability exercises. Stretching and Postural Educ           Patient will benefit from skilled therapeutic intervention in order to improve the following deficits and impairments:  Abnormal gait, Difficulty walking, Decreased endurance, Increased muscle spasms, Decreased activity tolerance, Pain,  Hypomobility, Impaired flexibility, Decreased strength, Postural dysfunction  Visit Diagnosis: Difficulty in walking, not elsewhere classified  Chronic bilateral low back pain without sciatica  Cervicalgia     Problem List Patient Active Problem List   Diagnosis Date Noted  . Hyperglycemia 04/09/2020  . Chronic neck pain 04/09/2020  . Coronary artery disease due to calcified coronary lesion 12/27/2018  . Osteopenia 02/24/2017  . Abdominal [place] (AAA) [place] rupture ([place] 07/06/2015  . Basal cell carcinoma 07/06/2015  . Essential hypertension 07/06/2015  . History of colon polyps 07/06/2015  . HLD (hyperlipidemia) 07/06/2015    Damoni Erker,ANGIE PTA 05/08/2020, 11:35 AM  [place] [address]Phone: [phone]  Fax:  [phone] Name: TClenton EsperMRN: [id number] of Birth: [birth year]

## 2020-05-11 ENCOUNTER — Other Ambulatory Visit: Payer: Medicare Other

## 2020-05-11 ENCOUNTER — Other Ambulatory Visit: Payer: Self-pay

## 2020-05-11 DIAGNOSIS — D649 Anemia, unspecified: Secondary | ICD-10-CM | POA: Diagnosis not present

## 2020-05-12 ENCOUNTER — Other Ambulatory Visit: Payer: Self-pay

## 2020-05-12 ENCOUNTER — Encounter: Payer: Self-pay | Admitting: Family Medicine

## 2020-05-12 DIAGNOSIS — D649 Anemia, unspecified: Secondary | ICD-10-CM

## 2020-05-12 LAB — CBC WITH DIFFERENTIAL/PLATELET
Absolute Monocytes: 1138 cells/uL — ABNORMAL HIGH (ref 200–950)
Basophils Absolute: 29 cells/uL (ref 0–200)
Basophils Relative: 0.4 %
Eosinophils Absolute: 29 cells/uL (ref 15–500)
Eosinophils Relative: 0.4 %
HCT: 31.8 % — ABNORMAL LOW (ref 38.5–50.0)
Hemoglobin: 10.8 g/dL — ABNORMAL LOW (ref 13.2–17.1)
Lymphs Abs: 1570 cells/uL (ref 850–3900)
MCH: 33.1 pg — ABNORMAL HIGH (ref 27.0–33.0)
MCHC: 34 g/dL (ref 32.0–36.0)
MCV: 97.5 fL (ref 80.0–100.0)
MPV: 10.1 fL (ref 7.5–12.5)
Monocytes Relative: 15.8 %
Neutro Abs: 4435 cells/uL (ref 1500–7800)
Neutrophils Relative %: 61.6 %
Platelets: 196 10*3/uL (ref 140–400)
RBC: 3.26 10*6/uL — ABNORMAL LOW (ref 4.20–5.80)
RDW: 13.2 % (ref 11.0–15.0)
Total Lymphocyte: 21.8 %
WBC: 7.2 10*3/uL (ref 3.8–10.8)

## 2020-05-12 LAB — VITAMIN B12: Vitamin B-12: 1290 pg/mL — ABNORMAL HIGH (ref 200–1100)

## 2020-05-12 LAB — BASIC METABOLIC PANEL
BUN: 13 mg/dL (ref 7–25)
CO2: 26 mmol/L (ref 20–32)
Calcium: 9.6 mg/dL (ref 8.6–10.3)
Chloride: 99 mmol/L (ref 98–110)
Creat: 1 mg/dL (ref 0.70–1.18)
Glucose, Bld: 93 mg/dL (ref 65–99)
Potassium: 5.1 mmol/L (ref 3.5–5.3)
Sodium: 134 mmol/L — ABNORMAL LOW (ref 135–146)

## 2020-05-12 LAB — IRON,TIBC AND FERRITIN PANEL
%SAT: 28 % (calc) (ref 20–48)
Ferritin: 223 ng/mL (ref 24–380)
Iron: 89 ug/dL (ref 50–180)
TIBC: 314 mcg/dL (calc) (ref 250–425)

## 2020-05-12 NOTE — Progress Notes (Signed)
Please inform patient of the following:  Kidney and sodium numbers are improving but his blood counts are dropping and I dont have a clear reason why.  I would like to check a FOBT. Please have patient come back to pick up the cards.  Algis Greenhouse. [name], MD 05/12/2020 10:54 AM

## 2020-05-13 ENCOUNTER — Ambulatory Visit: Payer: Medicare Other | Admitting: Physical Therapy

## 2020-05-16 ENCOUNTER — Other Ambulatory Visit: Payer: Self-pay | Admitting: Family Medicine

## 2020-05-18 ENCOUNTER — Other Ambulatory Visit: Payer: Self-pay

## 2020-05-18 ENCOUNTER — Ambulatory Visit: Payer: Medicare Other | Admitting: Physical Therapy

## 2020-05-18 ENCOUNTER — Encounter: Payer: Self-pay | Admitting: Physical Therapy

## 2020-05-18 DIAGNOSIS — R262 Difficulty in walking, not elsewhere classified: Secondary | ICD-10-CM | POA: Diagnosis not present

## 2020-05-18 DIAGNOSIS — M542 Cervicalgia: Secondary | ICD-10-CM

## 2020-05-18 DIAGNOSIS — G8929 Other chronic pain: Secondary | ICD-10-CM

## 2020-05-18 DIAGNOSIS — M545 Low back pain, unspecified: Secondary | ICD-10-CM | POA: Diagnosis not present

## 2020-05-18 NOTE — Therapy (Signed)
[place]. [place], [place], [address] Phone: [phone]   Fax:  [phone]  Physical Therapy Treatment  Patient Details  Name: Jeffrey [name] MRN: [id number] Date of Birth: 1941/06/20 Referring Provider (PT): Marikay Alar Date: 05/18/2020   PT End of Session - 05/18/20 1157    Visit Number 5    Date for PT Re-Evaluation 06/22/20    PT Start Time 9935    PT Stop Time 1232    PT Time Calculation (min) 47 min    Activity Tolerance Patient tolerated treatment well    Behavior During Therapy [facility] for tasks assessed/performed           Past Medical History:  Diagnosis Date  . AAA (abdominal aortic aneurysm) ([place])   . Allergy   . Arthritis   . Colon polyps   . Coronary artery calcification   . Eczematous dermatitis   . GERD (gastroesophageal reflux disease)   . Hypertension   . Iliac artery stenosis, left ([place])   . Osteopenia     Past Surgical History:  Procedure Laterality Date  . ABDOMINAL AORTIC ENDOVASCULAR FENESTRATED STENT GRAFT N/A 03/21/2018   Procedure: ABDOMINAL AORTIC ENDOVASCULAR FENESTRATED STENT GRAFT ; [place] CT PERFORMED;  Surgeon: [name], MD;  Location: [place];  Service: Vascular;  Laterality: N/A;  . Basal Skin Cancer  2012  . CATARACT EXTRACTION Bilateral   . TONSILLECTOMY    . TRANSURETHRAL RESECTION OF PROSTATE      There were no vitals filed for this visit.   Subjective Assessment - 05/18/20 1153    Subjective "the same" Reports that he feels like his neck is getting better but unsure about his back "I have good days and bad days with my back"    Currently in Pain? Yes    Pain Score 4                              [facility] Adult PT Treatment/Exercise - 05/18/20 0001      Neck Exercises: Machines for Strengthening   UBE (Upper Arm Bike) L1.5 2 mins ea      Neck Exercises: Standing   Other Standing Exercises Wall angels 2x10, Mini band walks + lat  str. 2x5, Shoulder rolls x20 frwd/back, T-spine rot at wall x10 ea way      Lumbar Exercises: Stretches   Passive Hamstring Stretch 2 reps;Left;Right;30 seconds      Lumbar Exercises: Aerobic   Nustep L5 16mns      Lumbar Exercises: Machines for Strengthening   Other Lumbar Machine Exercise Rows/Lats #20 2x10      Lumbar Exercises: Standing   Other Standing Lumbar Exercises sit-to-stand with chest press to [facility] #[facility] x10      Lumbar Exercises: Seated   Other Seated Lumbar Exercises D1 trunk rot #5 x5 (R. hip to L shoulder/L hip to R shoulder)       Lumbar Exercises: Supine   Straight Leg Raise 15 reps   L & R: Active HS str & core activation                   PT Short Term Goals - 04/28/20 1232      PT SHORT TERM GOAL #1   Title Pt will be I with initial HEP    Status Achieved  PT Long Term Goals - 05/06/20 1144      PT LONG TERM GOAL #1   Title Pt will be I with advanced HEP    Status Partially Met      PT LONG TERM GOAL #2   Title Pt will report 50% reduction in LBP and cervical pain    Status On-going                 Plan - 05/18/20 1157    Clinical Impression Statement Patient needed frequent verbal and tactile cuing to correctly perform ther ex interventions today. He compensated with trunk ext during seated rows/lats and core stab.  He did well with intro to more core stabilization exercises.    PT Treatment/Interventions ADLs/Self Care Home Management;Electrical Stimulation;Iontophoresis 30m/ml Dexamethasone;Moist Heat;Neuromuscular re-education;Therapeutic exercise;Therapeutic activities;Gait training;Stair training;Patient/family education;Manual techniques;Dry needling    PT Next Visit Plan Continue with incorporating core stab           Patient will benefit from skilled therapeutic intervention in order to improve the following deficits and impairments:  Abnormal gait, Difficulty walking, Decreased endurance, Increased  muscle spasms, Decreased activity tolerance, Pain, Hypomobility, Impaired flexibility, Decreased strength, Postural dysfunction  Visit Diagnosis: Difficulty in walking, not elsewhere classified  Chronic bilateral low back pain without sciatica  Cervicalgia     Problem List Patient Active Problem List   Diagnosis Date Noted  . Hyperglycemia 04/09/2020  . Chronic neck pain 04/09/2020  . Coronary artery disease due to calcified coronary lesion 12/27/2018  . Osteopenia 02/24/2017  . Abdominal aortic aneurysm (AAA) without rupture ([place] 07/06/2015  . Basal cell carcinoma 07/06/2015  . Essential hypertension 07/06/2015  . History of colon polyps 07/06/2015  . HLD (hyperlipidemia) 07/06/2015    KLavenia Atlas SPTA 05/18/2020, 12:35 PM  [place] [address]Phone: [phone]  Fax:  [phone] Name: Jeffrey LollarMRN: [id number] of Birth: [birth year]

## 2020-05-20 ENCOUNTER — Encounter: Payer: Self-pay | Admitting: Family Medicine

## 2020-05-21 ENCOUNTER — Encounter: Payer: Self-pay | Admitting: Physical Therapy

## 2020-05-21 ENCOUNTER — Ambulatory Visit: Payer: Medicare Other | Attending: Family Medicine | Admitting: Physical Therapy

## 2020-05-21 ENCOUNTER — Other Ambulatory Visit: Payer: Self-pay

## 2020-05-21 DIAGNOSIS — M545 Low back pain, unspecified: Secondary | ICD-10-CM | POA: Insufficient documentation

## 2020-05-21 DIAGNOSIS — G8929 Other chronic pain: Secondary | ICD-10-CM

## 2020-05-21 DIAGNOSIS — R262 Difficulty in walking, not elsewhere classified: Secondary | ICD-10-CM | POA: Insufficient documentation

## 2020-05-21 DIAGNOSIS — M542 Cervicalgia: Secondary | ICD-10-CM

## 2020-05-21 LAB — TIQ-NTM

## 2020-05-21 LAB — HOUSE ACCOUNT TRACKING

## 2020-05-21 NOTE — Therapy (Signed)
[place]. [place], [place], [address] Phone: [phone]   Fax:  [phone]  Physical Therapy Treatment  Patient Details  Name: Jeffrey [name] MRN: [id number] Date of Birth: December 20, 1940 Referring Provider (PT): Marikay Alar Date: 05/21/2020   PT End of Session - 05/21/20 1226    Visit Number 6    Date for PT Re-Evaluation 06/22/20    PT Start Time 0488    PT Stop Time 1227    PT Time Calculation (min) 42 min    Activity Tolerance Patient tolerated treatment well    Behavior During Therapy [facility] for tasks assessed/performed           Past Medical History:  Diagnosis Date  . AAA (abdominal aortic aneurysm) ([place])   . Allergy   . Arthritis   . Colon polyps   . Coronary artery calcification   . Eczematous dermatitis   . GERD (gastroesophageal reflux disease)   . Hypertension   . Iliac artery stenosis, left ([place])   . Osteopenia     Past Surgical History:  Procedure Laterality Date  . ABDOMINAL AORTIC ENDOVASCULAR FENESTRATED STENT GRAFT N/A 03/21/2018   Procedure: ABDOMINAL AORTIC ENDOVASCULAR FENESTRATED STENT GRAFT ; [place] CT PERFORMED;  Surgeon: Jeffrey [name], MD;  Location: [place];  Service: Vascular;  Laterality: N/A;  . Basal Skin Cancer  2012  . CATARACT EXTRACTION Bilateral   . TONSILLECTOMY    . TRANSURETHRAL RESECTION OF PROSTATE      There were no vitals filed for this visit.   Subjective Assessment - 05/21/20 1148    Subjective Same old same old    Currently in Pain? Yes    Pain Score 4     Pain Location Back              [facility] PT Assessment - 05/21/20 0001      AROM   Overall AROM Comments Cervical ROM WFL, Lumbar ROM decrease 25% for R side bending all other motions [facility]                         [facility] Adult PT Treatment/Exercise - 05/21/20 0001      Lumbar Exercises: Aerobic   Nustep L5 71mns      Lumbar Exercises: Machines for Strengthening   Cybex  Knee Extension 10lb 2x10     Cybex Knee Flexion 25lb 2x10    Other Lumbar Machine Exercise Rows/Lats #25 2x10      Lumbar Exercises: Standing   Other Standing Lumbar Exercises Hip Ext Abd 5lb x10 each                    PT Short Term Goals - 04/28/20 1232      PT SHORT TERM GOAL #1   Title Pt will be I with initial HEP    Status Achieved             PT Long Term Goals - 05/21/20 1211      PT LONG TERM GOAL #2   Title Pt will report 50% reduction in LBP and cervical pain    Status Partially Met      PT LONG TERM GOAL #3   Title Pt will report able to walk >/= 1 mile with no increase in LBP/cervical pain    Status Partially Met  Plan - 05/21/20 1227    Clinical Impression Statement Pt is progressing towards goals increasing his cervical and lumbar AROM, pt also reports less cervical and back pain overall. He reports walking up to a half of a mile. Tactile cues to prevent trunk leaning with standing hip extensions.  Good ROM noted with seated leg curls and extensions.    Personal Factors and Comorbidities Comorbidity 2    Comorbidities HTN, abdominal aortic aneurysm    Examination-Activity Limitations Locomotion Level;Lift;Stand;Stairs    Stability/Clinical Decision Making Stable/Uncomplicated    Rehab Potential Good    PT Frequency 2x / week    PT Duration 8 weeks    PT Treatment/Interventions ADLs/Self Care Home Management;Electrical Stimulation;Iontophoresis 82m/ml Dexamethasone;Moist Heat;Neuromuscular re-education;Therapeutic exercise;Therapeutic activities;Gait training;Stair training;Patient/family education;Manual techniques;Dry needling    PT Next Visit Plan Continue with incorporating core stab           Patient will benefit from skilled therapeutic intervention in order to improve the following deficits and impairments:  Abnormal gait, Difficulty walking, Decreased endurance, Increased muscle spasms, Decreased activity tolerance,  Pain, Hypomobility, Impaired flexibility, Decreased strength, Postural dysfunction  Visit Diagnosis: Cervicalgia  Chronic bilateral low back pain without sciatica  Difficulty in walking, not elsewhere classified     Problem List Patient Active Problem List   Diagnosis Date Noted  . Hyperglycemia 04/09/2020  . Chronic neck pain 04/09/2020  . Coronary artery disease due to calcified coronary lesion 12/27/2018  . Osteopenia 02/24/2017  . Abdominal aortic aneurysm (AAA) without rupture ([place] 07/06/2015  . Basal cell carcinoma 07/06/2015  . Essential hypertension 07/06/2015  . History of colon polyps 07/06/2015  . HLD (hyperlipidemia) 07/06/2015    RScot Jun PTA 05/21/2020, 12:32 PM  [place] [address]Phone: [phone]  Fax:  [phone] Name: Jeffrey DemetriouMRN: [id number] of Birth: [birth year]

## 2020-05-22 NOTE — Progress Notes (Signed)
I, Wendy Poet, LAT, ATC, am serving as scribe for Dr. Lynne Leader.  Jeffrey [name] is a 79 y.o. male who presents to [place] [facility] today for f/u chronic neck pain. Pt reports chronic neck pain since falling out of a tree 15+ years ago. Pt reports pain extends down to his LB and intermitted L knee pain. Pt was last seen by Dr. Georgina Snell on 04/13/20 and was advised to use heating pad, TENS unit, OTC pain meds, and was referred to PT of which he's completed 6 visits. Today, pt reports that he is feeling better mainly in his neck compared to his low back.  He rates his neck pain at 20% improvement and his low back pain at 10% improvement.  He states that he feels like his bones "crunch together" when he stands up and takes his firs few steps in the morning.  He has one more PT session scheduled.  Dx imaging: 04/13/20 C-spine XR showing significant DDD  Pertinent review of systems: No fevers or chills  Relevant historical information: History prior  thoracic fracture T6 and T7 after falling off of a ladder 16 years ago   Exam:  BP (!) 168/96 (BP Location: Right Arm, Patient Position: Sitting, Cuff Size: Normal)   Pulse 60   Ht 5\' 9"  (1.753 m)   Wt 196 lb 3.2 oz (89 kg)   SpO2 97%   BMI 28.97 kg/m  General: Well Developed, well nourished, and in no acute distress.   MSK: C-spine normal-appearing Nontender midline. Decreased cervical motion. Upper extremity strength is intact. T-spine normal.  Nontender midline. Decreased thoracic motion. Decreased upper extremity motion.    Lab and Radiology Results X-ray images T-spine obtained today personally and independently interpreted.  X-rays compared to x-rays T-spine visible on CT scan chest obtained July 10, 2019 Stable appearing compression fractures at T6 and T7 with thoracic kyphosis.  Degenerative changes present throughout otherwise. Await formal radiology review     Assessment and Plan: 79 y.o.  male with chronic neck and thoracic back pain.  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  He only improved about 25% in his cervical spine and 2% in his thoracic spine pain with good trial of physical therapy.  At this point he is having chronic bothersome pain.  For example he cannot walk a mile without having bothersome pain in his neck and upper back.  We discussed options.  Plan for advanced imaging of his thoracic spine and cervical spine to further characterize causes of pain and for potential interventional planning including facet injection planning.   We also discussed the possibility of Cymbalta.  That may improve his chronic pain and improve his functionality.  We will hold off on that for now but may consider that in the future.   Recheck following MRI.  Additionally MRI should also help evaluate compression fractures. They appear to be old however are more acute or subacute may benefit from kyphoplasty or vertebroplasty.     Orders Placed This Encounter  Procedures  . DG Thoracic Spine 2 View    Standing Status:   Future    Number of Occurrences:   1    Standing Expiration Date:   05/25/2021    Order Specific Question:   Reason for Exam (SYMPTOM  OR DIAGNOSIS REQUIRED)    Answer:   eval pain tspine    Order Specific Question:  Preferred imaging location?    Answer:   [facility]  . MR CERVICAL SPINE WO CONTRAST    Standing Status:   Future    Standing Expiration Date:   05/25/2021    Order Specific Question:   What is the patient's sedation requirement?    Answer:   No Sedation    Order Specific Question:   Does the patient have a pacemaker or implanted devices?    Answer:   No    Order Specific Question:   Preferred imaging location?    Answer:   [facility] (table limit-550lbs)  . MR THORACIC SPINE WO CONTRAST    Standing Status:   Future    Standing Expiration Date:    05/25/2021    Order Specific Question:   What is the patient's sedation requirement?    Answer:   No Sedation    Order Specific Question:   Does the patient have a pacemaker or implanted devices?    Answer:   No    Order Specific Question:   Preferred imaging location?    Answer:   [facility] (table limit-550lbs)   No orders of the defined types were placed in this encounter.    Discussed warning signs or symptoms. Please see discharge instructions. Patient expresses understanding.   The above documentation has been reviewed and is accurate and complete Lynne Leader, M.D.  Total encounter time 30 minutes including face-to-face time with the patient and, reviewing past medical record, and charting on the date of service.   Treatment plan and options

## 2020-05-25 ENCOUNTER — Ambulatory Visit: Payer: Medicare Other | Admitting: Family Medicine

## 2020-05-25 ENCOUNTER — Other Ambulatory Visit: Payer: Self-pay

## 2020-05-25 ENCOUNTER — Ambulatory Visit: Payer: Medicare Other | Admitting: Physical Therapy

## 2020-05-25 ENCOUNTER — Encounter: Payer: Self-pay | Admitting: Physical Therapy

## 2020-05-25 ENCOUNTER — Ambulatory Visit (INDEPENDENT_AMBULATORY_CARE_PROVIDER_SITE_OTHER): Payer: Medicare Other

## 2020-05-25 ENCOUNTER — Encounter: Payer: Self-pay | Admitting: Family Medicine

## 2020-05-25 VITALS — BP 168/96 | HR 60 | Ht 69.0 in | Wt 196.2 lb

## 2020-05-25 DIAGNOSIS — R262 Difficulty in walking, not elsewhere classified: Secondary | ICD-10-CM

## 2020-05-25 DIAGNOSIS — M546 Pain in thoracic spine: Secondary | ICD-10-CM

## 2020-05-25 DIAGNOSIS — M542 Cervicalgia: Secondary | ICD-10-CM

## 2020-05-25 DIAGNOSIS — M545 Low back pain, unspecified: Secondary | ICD-10-CM | POA: Diagnosis not present

## 2020-05-25 DIAGNOSIS — G8929 Other chronic pain: Secondary | ICD-10-CM

## 2020-05-25 DIAGNOSIS — S22060A Wedge compression fracture of T7-T8 vertebra, initial encounter for closed fracture: Secondary | ICD-10-CM | POA: Diagnosis not present

## 2020-05-25 NOTE — Patient Instructions (Signed)
Thank you for coming in today.  Please get an Xray today before you leave  You should hear from MRI scheduling within 1 week. If you do not hear please let me know.   Recheck after the MRIs to go over the results.   Can plan for facet injections.  Also we may consider cymbalta medicine for pain.

## 2020-05-25 NOTE — Therapy (Signed)
[place]. [place], [place], [address] Phone: [phone]   Fax:  [phone]  Physical Therapy Treatment  Patient Details  Name: Jeffrey [name] MRN: [id number] Date of Birth: February 28, 1941 Referring Provider (PT): Marikay Alar Date: 05/25/2020   PT End of Session - 05/25/20 1145    Visit Number 7    Date for PT Re-Evaluation 06/22/20    PT Start Time 1058    PT Stop Time 1143    PT Time Calculation (min) 45 min    Activity Tolerance Patient tolerated treatment well    Behavior During Therapy [facility] for tasks assessed/performed           Past Medical History:  Diagnosis Date  . AAA (abdominal aortic aneurysm) ([place])   . Allergy   . Arthritis   . Colon polyps   . Coronary artery calcification   . Eczematous dermatitis   . GERD (gastroesophageal reflux disease)   . Hypertension   . Iliac artery stenosis, left ([place])   . Osteopenia     Past Surgical History:  Procedure Laterality Date  . ABDOMINAL AORTIC ENDOVASCULAR FENESTRATED STENT GRAFT N/A 03/21/2018   Procedure: ABDOMINAL AORTIC ENDOVASCULAR FENESTRATED STENT GRAFT ; [place] CT PERFORMED;  Surgeon: [name], MD;  Location: [place];  Service: Vascular;  Laterality: N/A;  . Basal Skin Cancer  2012  . CATARACT EXTRACTION Bilateral   . TONSILLECTOMY    . TRANSURETHRAL RESECTION OF PROSTATE      There were no vitals filed for this visit.   Subjective Assessment - 05/25/20 1101    Subjective Pt reports neck is feeling a little better and back is feeling about the same    Currently in Pain? Yes    Pain Score 4     Pain Location Back    Pain Score 3    Pain Location Neck                             [facility] Adult PT Treatment/Exercise - 05/25/20 0001      Neck Exercises: Machines for Strengthening   UBE (Upper Arm Bike) L2 3 min each      Neck Exercises: Seated   Other Seated Exercise ER with scap retraction red TB 2x10        Lumbar Exercises: Stretches   Active Hamstring Stretch Right;Left;1 rep;20 seconds    Piriformis Stretch Right;Left;1 rep;20 seconds      Lumbar Exercises: Aerobic   Nustep L5 54mns      Lumbar Exercises: Machines for Strengthening   Cybex Knee Extension 10lb 2x10     Cybex Knee Flexion 25lb 2x10    Other Lumbar Machine Exercise Rows/Lats #25 2x10    Other Lumbar Machine Exercise standing shoulder ext 10# 2x10      Lumbar Exercises: Standing   Heel Raises 15 reps    Other Standing Lumbar Exercises yellow ball chest press with overhead raise 2x10B      Lumbar Exercises: Seated   Sit to Stand 10 reps   with yellow ball chest press                   PT Short Term Goals - 04/28/20 1232      PT SHORT TERM GOAL #1   Title Pt will be I with initial HEP    Status Achieved  PT Long Term Goals - 05/25/20 1138      PT LONG TERM GOAL #1   Title Pt will be I with advanced HEP    Status Partially Met      PT LONG TERM GOAL #2   Title Pt will report 50% reduction in LBP and cervical pain    Baseline neck: 20% better; back: 10% better    Status Partially Met      PT LONG TERM GOAL #3   Title Pt will report able to walk >/= 1 mile with no increase in LBP/cervical pain    Baseline reports ~1/2 mile    Status Partially Met                 Plan - 05/25/20 1145    Clinical Impression Statement Cues for upright posture with seated and standing lumbar ex's. Postural ex's and scap stab to help with FHP/rounded shoulders. Pt reports cervical pain has improved 25% and lumbar pain improved 10%. Pt has follow up with MD today 12/6 and will discuss any updates to continuation of PT next rx.    PT Treatment/Interventions ADLs/Self Care Home Management;Electrical Stimulation;Iontophoresis 46m/ml Dexamethasone;Moist Heat;Neuromuscular re-education;Therapeutic exercise;Therapeutic activities;Gait training;Stair training;Patient/family education;Manual techniques;Dry  needling    PT Next Visit Plan Continue with incorporating core stab    Consulted and Agree with Plan of Care Patient           Patient will benefit from skilled therapeutic intervention in order to improve the following deficits and impairments:  Abnormal gait, Difficulty walking, Decreased endurance, Increased muscle spasms, Decreased activity tolerance, Pain, Hypomobility, Impaired flexibility, Decreased strength, Postural dysfunction  Visit Diagnosis: Cervicalgia  Chronic bilateral low back pain without sciatica  Difficulty in walking, not elsewhere classified     Problem List Patient Active Problem List   Diagnosis Date Noted  . Hyperglycemia 04/09/2020  . Chronic neck pain 04/09/2020  . Coronary artery disease due to calcified coronary lesion 12/27/2018  . Osteopenia 02/24/2017  . Abdominal aortic aneurysm (AAA) without rupture ([place] 07/06/2015  . Basal cell carcinoma 07/06/2015  . Essential hypertension 07/06/2015  . History of colon polyps 07/06/2015  . HLD (hyperlipidemia) 07/06/2015   AAmador Cunas PT, DPT ADonald ProseSugg 05/25/2020, 11:48 AM  [place] [address]Phone: [phone]  Fax:  [phone] Name: Jeffrey CulbreathMRN: [id number] of Birth: 804/07/1940

## 2020-05-26 NOTE — Progress Notes (Signed)
X-ray thoracic spine shows chronic compression fractures at T6 and T7

## 2020-05-28 ENCOUNTER — Encounter: Payer: Self-pay | Admitting: Physical Therapy

## 2020-05-28 ENCOUNTER — Other Ambulatory Visit: Payer: Self-pay

## 2020-05-28 ENCOUNTER — Ambulatory Visit: Payer: Medicare Other | Admitting: Physical Therapy

## 2020-05-28 DIAGNOSIS — M545 Low back pain, unspecified: Secondary | ICD-10-CM | POA: Diagnosis not present

## 2020-05-28 DIAGNOSIS — M542 Cervicalgia: Secondary | ICD-10-CM

## 2020-05-28 DIAGNOSIS — G8929 Other chronic pain: Secondary | ICD-10-CM

## 2020-05-28 DIAGNOSIS — R262 Difficulty in walking, not elsewhere classified: Secondary | ICD-10-CM | POA: Diagnosis not present

## 2020-05-28 NOTE — Therapy (Signed)
[place]. [place], [place], [address] Phone: [phone]   Fax:  [phone]  Physical Therapy Treatment  Patient Details  Name: Jeffrey [name] MRN: [id number] Date of Birth: 11/03/1940 Referring Provider (PT): Marikay Alar Date: 05/28/2020   PT End of Session - 05/28/20 1223    Visit Number 8    Date for PT Re-Evaluation 06/22/20    PT Start Time 2585    PT Stop Time 1226    PT Time Calculation (min) 41 min    Activity Tolerance Patient tolerated treatment well    Behavior During Therapy [facility] for tasks assessed/performed           Past Medical History:  Diagnosis Date  . AAA (abdominal aortic aneurysm) ([place])   . Allergy   . Arthritis   . Colon polyps   . Coronary artery calcification   . Eczematous dermatitis   . GERD (gastroesophageal reflux disease)   . Hypertension   . Iliac artery stenosis, left ([place])   . Osteopenia     Past Surgical History:  Procedure Laterality Date  . ABDOMINAL AORTIC ENDOVASCULAR FENESTRATED STENT GRAFT N/A 03/21/2018   Procedure: ABDOMINAL AORTIC ENDOVASCULAR FENESTRATED STENT GRAFT ; [place] CT PERFORMED;  Surgeon: [name], MD;  Location: [facility];  Service: Vascular;  Laterality: N/A;  . Basal Skin Cancer  2012  . CATARACT EXTRACTION Bilateral   . TONSILLECTOMY    . TRANSURETHRAL RESECTION OF PROSTATE      There were no vitals filed for this visit.   Subjective Assessment - 05/28/20 1144    Subjective doing ok, always have pain it is never ending    Currently in Pain? Yes    Pain Score 5     Pain Location Back                             [facility] Adult PT Treatment/Exercise - 05/28/20 0001      Neck Exercises: Machines for Strengthening   UBE (Upper Arm Bike) L2 2 min each      Neck Exercises: Seated   Neck Retraction 20 reps;3 secs    Other Seated Exercise ER with scap retraction red TB 2x10, Horiz Abd red 2x10      Lumbar  Exercises: Aerobic   Nustep L5 18mns      Lumbar Exercises: Machines for Strengthening   Cybex Lumbar Extension black tband 2 sets 12    Cybex Knee Extension 10lb 2x12    Cybex Knee Flexion 25lb 2x12    Other Lumbar Machine Exercise Rows23x12/Lats 2x10 #35      Lumbar Exercises: Standing   Shoulder Extension 20 reps;Both;Power Tower    Shoulder Extension Limitations 10                    PT Short Term Goals - 04/28/20 1232      PT SHORT TERM GOAL #1   Title Pt will be I with initial HEP    Status Achieved             PT Long Term Goals - 05/25/20 1138      PT LONG TERM GOAL #1   Title Pt will be I with advanced HEP    Status Partially Met      PT LONG TERM GOAL #2   Title Pt will report 50% reduction in LBP and cervical pain  Baseline neck: 20% better; back: 10% better    Status Partially Met      PT LONG TERM GOAL #3   Title Pt will report able to walk >/= 1 mile with no increase in LBP/cervical pain    Baseline reports ~1/2 mile    Status Partially Met                 Plan - 05/28/20 1223    Clinical Impression Statement Pt did well in today's therapy session. He was able to tolerated interventions with increase resistance and or repetitions Postural cues required with standing shoulder extensions. Added lumbar extension without issues. Tactile cues with cervical retraction not to extend head. Pt does report that he would be able to walk for longer distances    Personal Factors and Comorbidities Comorbidity 2    Comorbidities HTN, abdominal aortic aneurysm    Examination-Activity Limitations Locomotion Level;Lift;Stand;Stairs    Stability/Clinical Decision Making Stable/Uncomplicated    Rehab Potential Good    PT Frequency 2x / week    PT Duration 8 weeks    PT Treatment/Interventions ADLs/Self Care Home Management;Electrical Stimulation;Iontophoresis 31m/ml Dexamethasone;Moist Heat;Neuromuscular re-education;Therapeutic exercise;Therapeutic  activities;Gait training;Stair training;Patient/family education;Manual techniques;Dry needling    PT Next Visit Plan functional endurance           Patient will benefit from skilled therapeutic intervention in order to improve the following deficits and impairments:  Abnormal gait,Difficulty walking,Decreased endurance,Increased muscle spasms,Decreased activity tolerance,Pain,Hypomobility,Impaired flexibility,Decreased strength,Postural dysfunction  Visit Diagnosis: Chronic bilateral low back pain without sciatica  Difficulty in walking, not elsewhere classified  Cervicalgia     Problem List Patient Active Problem List   Diagnosis Date Noted  . Hyperglycemia 04/09/2020  . Chronic neck pain 04/09/2020  . Coronary artery disease due to calcified coronary lesion 12/27/2018  . Osteopenia 02/24/2017  . Abdominal aortic aneurysm (AAA) without rupture ([place] 07/06/2015  . Basal cell carcinoma 07/06/2015  . Essential hypertension 07/06/2015  . History of colon polyps 07/06/2015  . HLD (hyperlipidemia) 07/06/2015    RScot Jun PTA 05/28/2020, 12:28 PM  [place] [address]Phone: [phone]  Fax:  [phone] Name: Jeffrey ChoungMRN: [id number] of Birth: 8Dec 30, 1942

## 2020-06-04 ENCOUNTER — Encounter: Payer: Self-pay | Admitting: Physical Therapy

## 2020-06-04 ENCOUNTER — Ambulatory Visit: Payer: Medicare Other | Admitting: Physical Therapy

## 2020-06-04 ENCOUNTER — Other Ambulatory Visit: Payer: Self-pay

## 2020-06-04 DIAGNOSIS — R262 Difficulty in walking, not elsewhere classified: Secondary | ICD-10-CM | POA: Diagnosis not present

## 2020-06-04 DIAGNOSIS — M545 Low back pain, unspecified: Secondary | ICD-10-CM | POA: Diagnosis not present

## 2020-06-04 DIAGNOSIS — G8929 Other chronic pain: Secondary | ICD-10-CM

## 2020-06-04 DIAGNOSIS — M542 Cervicalgia: Secondary | ICD-10-CM

## 2020-06-04 NOTE — Therapy (Signed)
[place]. Jeffrey [name], [name], [address] Phone: [phone]   Fax:  [phone]  Physical Therapy Treatment  Patient Details  Name: Jeffrey [name] MRN: [id number] Date of Birth: [birth year] Referring Provider (PT): Marikay Alar Date: 06/04/2020   PT End of Session - 06/04/20 1224    Visit Number 9    Date for PT Re-Evaluation 06/22/20    PT Start Time 1142    PT Stop Time 1225    PT Time Calculation (min) 43 min    Activity Tolerance Patient tolerated treatment well    Behavior During Therapy [facility] for tasks assessed/performed           Past Medical History:  Diagnosis Date  . AAA (abdominal aortic aneurysm) ([place])   . Allergy   . Arthritis   . Colon polyps   . Coronary artery calcification   . Eczematous dermatitis   . GERD (gastroesophageal reflux disease)   . Hypertension   . Iliac artery stenosis, left ([place])   . Osteopenia     Past Surgical History:  Procedure Laterality Date  . ABDOMINAL AORTIC ENDOVASCULAR FENESTRATED STENT GRAFT N/A 03/21/2018   Procedure: ABDOMINAL AORTIC ENDOVASCULAR FENESTRATED STENT GRAFT ; Jeffrey [name] PERFORMED;  Surgeon: Jeffrey [name], MD;  Location: [facility];  Service: Vascular;  Laterality: N/A;  . Basal Skin Cancer  2012  . CATARACT EXTRACTION Bilateral   . TONSILLECTOMY    . TRANSURETHRAL RESECTION OF PROSTATE      There were no vitals filed for this visit.   Subjective Assessment - 06/04/20 1142    Subjective "Not too bad"    Currently in Pain? Yes    Pain Score 3     Pain Location Back                             [facility] Adult PT Treatment/Exercise - 06/04/20 0001      Neck Exercises: Machines for Strengthening   UBE (Upper Arm Bike) L1.5 2 min each      Neck Exercises: Seated   Neck Retraction 20 reps;3 secs    Other Seated Exercise ER with scap retraction red TB 2x10, Horiz Abd red 2x10      Lumbar Exercises: Aerobic   Nustep L5 44mns       Lumbar Exercises: Machines for Strengthening   Cybex Lumbar Extension black tband 2 sets 15    Cybex Knee Extension 15lb 2x12    Cybex Knee Flexion 35lb 2x10    Other Lumbar Machine Exercise Rows & Lats 35lb 2x12    Other Lumbar Machine Exercise standing shoulder ext 10# 2x12      Lumbar Exercises: Seated   Sit to Stand 20 reps   OHP yellow ball                   PT Short Term Goals - 04/28/20 1232      PT SHORT TERM GOAL #1   Title Pt will be I with initial HEP    Status Achieved             PT Long Term Goals - 05/25/20 1138      PT LONG TERM GOAL #1   Title Pt will be I with advanced HEP    Status Partially Met      PT LONG TERM GOAL #2   Title Pt will report 50% reduction in LBP  and cervical pain    Baseline neck: 20% better; back: 10% better    Status Partially Met      PT LONG TERM GOAL #3   Title Pt will report able to walk >/= 1 mile with no increase in LBP/cervical pain    Baseline reports ~1/2 mile    Status Partially Met                 Plan - 06/04/20 1226    Clinical Impression Statement Pt reports some improvement with decrease pain. Increase resistance tolerated with seated leg curls and extensions. Tactile cues to keep shoulder down and back with seated rows. Pt has a forward shoulders and round posture cues throughout session to maintain correct posture.    Personal Factors and Comorbidities Comorbidity 2    Comorbidities HTN, abdominal aortic aneurysm    Examination-Activity Limitations Locomotion Level;Lift;Stand;Stairs    Stability/Clinical Decision Making Stable/Uncomplicated    Rehab Potential Good    PT Frequency 2x / week    PT Duration 8 weeks    PT Treatment/Interventions ADLs/Self Care Home Management;Electrical Stimulation;Iontophoresis 42m/ml Dexamethasone;Moist Heat;Neuromuscular re-education;Therapeutic exercise;Therapeutic activities;Gait training;Stair training;Patient/family education;Manual techniques;Dry  needling    PT Next Visit Plan functional endurance           Patient will benefit from skilled therapeutic intervention in order to improve the following deficits and impairments:  Abnormal gait,Difficulty walking,Decreased endurance,Increased muscle spasms,Decreased activity tolerance,Pain,Hypomobility,Impaired flexibility,Decreased strength,Postural dysfunction  Visit Diagnosis: Chronic bilateral low back pain without sciatica  Difficulty in walking, not elsewhere classified  Cervicalgia     Problem List Patient Active Problem List   Diagnosis Date Noted  . Hyperglycemia 04/09/2020  . Chronic neck pain 04/09/2020  . Coronary artery disease due to calcified coronary lesion 12/27/2018  . Osteopenia 02/24/2017  . Abdominal aortic aneurysm (AAA) without rupture ([place] 07/06/2015  . Basal cell carcinoma 07/06/2015  . Essential hypertension 07/06/2015  . History of colon polyps 07/06/2015  . HLD (hyperlipidemia) 07/06/2015    RScot Jun12/16/2021, 12:30 PM  [place] [address]Phone: [phone]  Fax:  [phone] Name: Jeffrey FeinsteinMRN: [id number] of Birth: 8September 07, 1942

## 2020-06-05 ENCOUNTER — Telehealth: Payer: Self-pay

## 2020-06-05 NOTE — Telephone Encounter (Signed)
Called patient explain to patient sample send to [facility] instead of [facility]. Patient will come by on Monday to pick up new kit. Advise to return kit to our office. Pt verbalized understanding

## 2020-06-05 NOTE — Telephone Encounter (Signed)
Patient sent two my chart messages through wanting to know what "TIQ-NTM"  And "house account tracking" was.  This was sent to his mychart lab results.    I reached out to [facility] and waiting on response.  I reached out to [facility].  [facility] states that TIQ-NTM stands for "Test in Question" due to not receiving a requisition.  House account tracking was sent back due to missing account number.    I am not sure why this was not populated to clinical as well.    Patient dropped off a stool sample for a fecal occult blood immunochemical.    [facility] is requesting another sample to be sent with requisition.

## 2020-06-09 ENCOUNTER — Other Ambulatory Visit: Payer: Self-pay

## 2020-06-09 ENCOUNTER — Ambulatory Visit: Payer: Medicare Other | Admitting: Physical Therapy

## 2020-06-09 ENCOUNTER — Encounter: Payer: Self-pay | Admitting: Physical Therapy

## 2020-06-09 DIAGNOSIS — G8929 Other chronic pain: Secondary | ICD-10-CM

## 2020-06-09 DIAGNOSIS — M542 Cervicalgia: Secondary | ICD-10-CM

## 2020-06-09 DIAGNOSIS — M545 Low back pain, unspecified: Secondary | ICD-10-CM | POA: Diagnosis not present

## 2020-06-09 DIAGNOSIS — R262 Difficulty in walking, not elsewhere classified: Secondary | ICD-10-CM | POA: Diagnosis not present

## 2020-06-09 NOTE — Therapy (Signed)
[place]. [place], [place], [address] Phone: [phone]   Fax:  [phone] Progress Note Reporting Period 06/23/19 to 06/09/20 for the first 10 vistis  See note below for Objective Data and Assessment of Progress/Goals.      Physical Therapy Treatment  Patient Details  Name: Jeffrey [name] MRN: [id number] Date of Birth: Nov 16, 1940 Referring Provider (PT): Marikay Alar Date: 06/09/2020   PT End of Session - 06/09/20 1219    Visit Number 10    Date for PT Re-Evaluation 06/22/20    PT Start Time 1139    PT Stop Time 1220    PT Time Calculation (min) 41 min    Activity Tolerance Patient tolerated treatment well    Behavior During Therapy [facility] for tasks assessed/performed           Past Medical History:  Diagnosis Date  . AAA (abdominal aortic aneurysm) ([place])   . Allergy   . Arthritis   . Colon polyps   . Coronary artery calcification   . Eczematous dermatitis   . GERD (gastroesophageal reflux disease)   . Hypertension   . Iliac artery stenosis, left ([place])   . Osteopenia     Past Surgical History:  Procedure Laterality Date  . ABDOMINAL AORTIC ENDOVASCULAR FENESTRATED STENT GRAFT N/A 03/21/2018   Procedure: ABDOMINAL AORTIC ENDOVASCULAR FENESTRATED STENT GRAFT ; [place] CT PERFORMED;  Surgeon: [name], MD;  Location: [facility];  Service: Vascular;  Laterality: N/A;  . Basal Skin Cancer  2012  . CATARACT EXTRACTION Bilateral   . TONSILLECTOMY    . TRANSURETHRAL RESECTION OF PROSTATE      There were no vitals filed for this visit.   Subjective Assessment - 06/09/20 1142    Subjective "Yesterday was Murder"in regards to back and neck pain, No known reason. Today he feels fine    Currently in Pain? Yes    Pain Score 3     Pain Location Back                             [facility] Adult PT Treatment/Exercise - 06/09/20 0001      Ambulation/Gait   Ambulation/Gait Yes     Ambulation/Gait Assistance 6: Modified independent (Device/Increase time)    Ambulation Distance (Feet) 500 Feet    Assistive device None    Gait Pattern Step-through pattern    Gait Comments slight increase in back pain      Lumbar Exercises: Aerobic   Recumbent Bike L2.1 x5 min    Nustep L5 75mns      Lumbar Exercises: Machines for Strengthening   Other Lumbar Machine Exercise Rows & Lats 35lb 2x12      Lumbar Exercises: Standing   Shoulder Extension 20 reps;Both;Power Tower    Shoulder Extension Limitations 10    Other Standing Lumbar Exercises Overhead Ext yellow ball 2x10      Lumbar Exercises: Seated   Other Seated Lumbar Exercises OHP yellow ball 2x10                    PT Short Term Goals - 04/28/20 1232      PT SHORT TERM GOAL #1   Title Pt will be I with initial HEP    Status Achieved             PT Long Term Goals - 06/09/20 1221  PT LONG TERM GOAL #1   Title Pt will be I with advanced HEP    Status Partially Met      PT LONG TERM GOAL #2   Title Pt will report 50% reduction in LBP and cervical pain    Status Partially Met      PT LONG TERM GOAL #3   Title Pt will report able to walk >/= 1 mile with no increase in LBP/cervical pain    Status Partially Met                 Plan - 06/09/20 1221    Clinical Impression Statement Added gait to assess activity tolerance. pt able to ambulate ~ 500 ft before having an increase in mid back pain. Pt reports that he could feel his bones crunching in his back when ambulating. Good lumbar ext achieved with standing overhead extensions. Tactile cues for posture needed with seated horizontal abduction.    Personal Factors and Comorbidities Comorbidity 2    Comorbidities HTN, abdominal aortic aneurysm    Examination-Activity Limitations Locomotion Level;Lift;Stand;Stairs    Stability/Clinical Decision Making Stable/Uncomplicated    Rehab Potential Good    PT Frequency 2x / week    PT Duration 8  weeks    PT Treatment/Interventions ADLs/Self Care Home Management;Electrical Stimulation;Iontophoresis 37m/ml Dexamethasone;Moist Heat;Neuromuscular re-education;Therapeutic exercise;Therapeutic activities;Gait training;Stair training;Patient/family education;Manual techniques;Dry needling    PT Next Visit Plan functional endurance           Patient will benefit from skilled therapeutic intervention in order to improve the following deficits and impairments:  Abnormal gait,Difficulty walking,Decreased endurance,Increased muscle spasms,Decreased activity tolerance,Pain,Hypomobility,Impaired flexibility,Decreased strength,Postural dysfunction  Visit Diagnosis: Chronic bilateral low back pain without sciatica  Cervicalgia  Difficulty in walking, not elsewhere classified     Problem List Patient Active Problem List   Diagnosis Date Noted  . Hyperglycemia 04/09/2020  . Chronic neck pain 04/09/2020  . Coronary artery disease due to calcified coronary lesion 12/27/2018  . Osteopenia 02/24/2017  . Abdominal aortic aneurysm (AAA) without rupture ([place] 07/06/2015  . Basal cell carcinoma 07/06/2015  . Essential hypertension 07/06/2015  . History of colon polyps 07/06/2015  . HLD (hyperlipidemia) 07/06/2015    RScot Jun PTA 06/09/2020, 12:25 PM  [place] [address]Phone: [phone]  Fax:  [phone] Name: Jeffrey RaduMRN: [id number] of Birth: 810/04/1941

## 2020-06-11 ENCOUNTER — Other Ambulatory Visit: Payer: Self-pay

## 2020-06-11 ENCOUNTER — Encounter: Payer: Self-pay | Admitting: Physical Therapy

## 2020-06-11 ENCOUNTER — Other Ambulatory Visit (INDEPENDENT_AMBULATORY_CARE_PROVIDER_SITE_OTHER): Payer: Medicare Other

## 2020-06-11 ENCOUNTER — Ambulatory Visit: Payer: Medicare Other | Admitting: Physical Therapy

## 2020-06-11 DIAGNOSIS — D649 Anemia, unspecified: Secondary | ICD-10-CM

## 2020-06-11 DIAGNOSIS — G8929 Other chronic pain: Secondary | ICD-10-CM

## 2020-06-11 DIAGNOSIS — R262 Difficulty in walking, not elsewhere classified: Secondary | ICD-10-CM

## 2020-06-11 DIAGNOSIS — M542 Cervicalgia: Secondary | ICD-10-CM

## 2020-06-11 DIAGNOSIS — M545 Low back pain, unspecified: Secondary | ICD-10-CM | POA: Diagnosis not present

## 2020-06-11 LAB — FECAL OCCULT BLOOD, IMMUNOCHEMICAL: Fecal Occult Bld: NEGATIVE

## 2020-06-11 NOTE — Progress Notes (Signed)
Please inform patient of the following:  Stool study was normal. He does not have any iron deficiency so I am not worried about blood loss. I would like to recheck his CBC again in 4-6 weeks to make sure that it is stable. In the meantime he should make sure that he is getting plenty of fluids and staying well hydrated.  Algis Greenhouse. [name], MD 06/11/2020 10:18 AM

## 2020-06-11 NOTE — Therapy (Signed)
[place]. [place], [place], [address] Phone: [phone]   Fax:  [phone]  Physical Therapy Treatment  Patient Details  Name: Jeffrey [name] MRN: [id number] Date of Birth: 1941-05-02 Referring Provider (PT): Marikay Alar Date: 06/11/2020   PT End of Session - 06/11/20 1446    Visit Number 11    Date for PT Re-Evaluation 06/22/20    PT Start Time 1353    PT Stop Time 1437    PT Time Calculation (min) 44 min    Activity Tolerance Patient tolerated treatment well    Behavior During Therapy [facility] for tasks assessed/performed           Past Medical History:  Diagnosis Date  . AAA (abdominal aortic aneurysm) ([place])   . Allergy   . Arthritis   . Colon polyps   . Coronary artery calcification   . Eczematous dermatitis   . GERD (gastroesophageal reflux disease)   . Hypertension   . Iliac artery stenosis, left ([place])   . Osteopenia     Past Surgical History:  Procedure Laterality Date  . ABDOMINAL AORTIC ENDOVASCULAR FENESTRATED STENT GRAFT N/A 03/21/2018   Procedure: ABDOMINAL AORTIC ENDOVASCULAR FENESTRATED STENT GRAFT ; [place] CT PERFORMED;  Surgeon: [name], MD;  Location: [facility];  Service: Vascular;  Laterality: N/A;  . Basal Skin Cancer  2012  . CATARACT EXTRACTION Bilateral   . TONSILLECTOMY    . TRANSURETHRAL RESECTION OF PROSTATE      There were no vitals filed for this visit.   Subjective Assessment - 06/11/20 1351    Subjective Pt feeling pretty good today    Currently in Pain? Yes    Pain Score 4     Pain Location Back    Pain Score 3    Pain Location Neck                             [facility] Adult PT Treatment/Exercise - 06/11/20 0001      Lumbar Exercises: Aerobic   Recumbent Bike L2 x 5 min    Nustep L5 68mns      Lumbar Exercises: Machines for Strengthening   Cybex Knee Extension 15lb 2x10    Cybex Knee Flexion 35lb 2x10    Other Lumbar Machine  Exercise Rows & Lats 35lb 2x12    Other Lumbar Machine Exercise standing shoulder ext 10# 2x10      Lumbar Exercises: Standing   Heel Raises 15 reps    Shoulder Extension 20 reps;Both;Power Tower    Shoulder Extension Limitations 10    Other Standing Lumbar Exercises resisted gait x4 each direction 30#                    PT Short Term Goals - 04/28/20 1232      PT SHORT TERM GOAL #1   Title Pt will be I with initial HEP    Status Achieved             PT Long Term Goals - 06/09/20 1221      PT LONG TERM GOAL #1   Title Pt will be I with advanced HEP    Status Partially Met      PT LONG TERM GOAL #2   Title Pt will report 50% reduction in LBP and cervical pain    Status Partially Met      PT LONG TERM  GOAL #3   Title Pt will report able to walk >/= 1 mile with no increase in LBP/cervical pain    Status Partially Met                 Plan - 06/11/20 1447    Clinical Impression Statement Pt able to tolerate several standing ex's in a row before needing a seated rest break. Reports no increase in LBP with exercise this rx. CGA for resisted gait esp with sidestepping; cues for increased step length. Continue to progress functional strength/endurance.    PT Treatment/Interventions ADLs/Self Care Home Management;Electrical Stimulation;Iontophoresis 9m/ml Dexamethasone;Moist Heat;Neuromuscular re-education;Therapeutic exercise;Therapeutic activities;Gait training;Stair training;Patient/family education;Manual techniques;Dry needling    PT Next Visit Plan functional endurance    Consulted and Agree with Plan of Care Patient           Patient will benefit from skilled therapeutic intervention in order to improve the following deficits and impairments:  Abnormal gait,Difficulty walking,Decreased endurance,Increased muscle spasms,Decreased activity tolerance,Pain,Hypomobility,Impaired flexibility,Decreased strength,Postural dysfunction  Visit Diagnosis: Chronic  bilateral low back pain without sciatica  Cervicalgia  Difficulty in walking, not elsewhere classified     Problem List Patient Active Problem List   Diagnosis Date Noted  . Hyperglycemia 04/09/2020  . Chronic neck pain 04/09/2020  . Coronary artery disease due to calcified coronary lesion 12/27/2018  . Osteopenia 02/24/2017  . Abdominal aortic aneurysm (AAA) without rupture ([place] 07/06/2015  . Basal cell carcinoma 07/06/2015  . Essential hypertension 07/06/2015  . History of colon polyps 07/06/2015  . HLD (hyperlipidemia) 07/06/2015   Jeffrey [name] PT, DPT Jeffrey [name] 06/11/2020, 2:49 PM  [place] [address]Phone: [phone]  Fax:  [phone] Name: Jeffrey KohlmannMRN: [id number] of Birth: [birth year]

## 2020-06-16 ENCOUNTER — Encounter: Payer: Self-pay | Admitting: Physical Therapy

## 2020-06-16 ENCOUNTER — Other Ambulatory Visit: Payer: Self-pay | Admitting: *Deleted

## 2020-06-16 ENCOUNTER — Other Ambulatory Visit: Payer: Self-pay

## 2020-06-16 ENCOUNTER — Ambulatory Visit: Payer: Medicare Other | Admitting: Physical Therapy

## 2020-06-16 DIAGNOSIS — R262 Difficulty in walking, not elsewhere classified: Secondary | ICD-10-CM

## 2020-06-16 DIAGNOSIS — G8929 Other chronic pain: Secondary | ICD-10-CM | POA: Diagnosis not present

## 2020-06-16 DIAGNOSIS — M542 Cervicalgia: Secondary | ICD-10-CM | POA: Diagnosis not present

## 2020-06-16 DIAGNOSIS — M545 Low back pain, unspecified: Secondary | ICD-10-CM

## 2020-06-16 DIAGNOSIS — D649 Anemia, unspecified: Secondary | ICD-10-CM

## 2020-06-16 NOTE — Therapy (Signed)
[place]. [place], [place], [address] Phone: [phone]   Fax:  [phone]  Physical Therapy Treatment  Patient Details  Name: Jeffrey [name] MRN: [id number] Date of Birth: 10-Mar-1941 Referring Provider (PT): Marikay Alar Date: 06/16/2020   PT End of Session - 06/16/20 1210    Visit Number 12    Date for PT Re-Evaluation 06/22/20    PT Start Time 1133    PT Stop Time 1215    PT Time Calculation (min) 42 min    Activity Tolerance Patient tolerated treatment well    Behavior During Therapy [facility] for tasks assessed/performed           Past Medical History:  Diagnosis Date  . AAA (abdominal aortic aneurysm) ([place])   . Allergy   . Arthritis   . Colon polyps   . Coronary artery calcification   . Eczematous dermatitis   . GERD (gastroesophageal reflux disease)   . Hypertension   . Iliac artery stenosis, left ([place])   . Osteopenia     Past Surgical History:  Procedure Laterality Date  . ABDOMINAL AORTIC ENDOVASCULAR FENESTRATED STENT GRAFT N/A 03/21/2018   Procedure: ABDOMINAL AORTIC ENDOVASCULAR FENESTRATED STENT GRAFT ; [place] CT PERFORMED;  Surgeon: [name], MD;  Location: [place];  Service: Vascular;  Laterality: N/A;  . Basal Skin Cancer  2012  . CATARACT EXTRACTION Bilateral   . TONSILLECTOMY    . TRANSURETHRAL RESECTION OF PROSTATE      There were no vitals filed for this visit.   Subjective Assessment - 06/16/20 1134    Subjective Neck is only a 2, back is not too bad today. Felt crappy for not sleeping    Currently in Pain? Yes    Pain Score 4     Pain Location Back                             [facility] Adult PT Treatment/Exercise - 06/16/20 0001      Ambulation/Gait   Ambulation/Gait Yes    Ambulation/Gait Assistance 6: Modified independent (Device/Increase time)    Ambulation Distance (Feet) 700 Feet    Assistive device None    Gait Pattern Step-through  pattern;Antalgic;Decreased stance time - left   increase in LBP and LLE pain   Ambulation Surface Unlevel;Outdoor;Paved    Gait Comments increase LBP and LLE pain      Neck Exercises: Seated   Neck Retraction 20 reps;3 secs    Other Seated Exercise ER with scap retraction red TB 2x10, Horiz Abd red 2x10      Lumbar Exercises: Aerobic   UBE (Upper Arm Bike) L2 x2 min each    Nustep L5 47mns      Lumbar Exercises: Machines for Strengthening   Other Lumbar Machine Exercise Rows & Lats 45lb 2x10                    PT Short Term Goals - 04/28/20 1232      PT SHORT TERM GOAL #1   Title Pt will be I with initial HEP    Status Achieved             PT Long Term Goals - 06/09/20 1221      PT LONG TERM GOAL #1   Title Pt will be I with advanced HEP    Status Partially Met      PT  LONG TERM GOAL #2   Title Pt will report 50% reduction in LBP and cervical pain    Status Partially Met      PT LONG TERM GOAL #3   Title Pt will report able to walk >/= 1 mile with no increase in LBP/cervical pain    Status Partially Met                 Plan - 06/16/20 1207    Clinical Impression Statement Pt able to progressed to outdoor ambulation. Towards the end after uphill ambulation pt developed a limp from LLE and low back pain. Pt reports L knee pain that went into his glute, pt stated he has a hardening of the arteries in LLE. Pt tolerated an increase in resistance with seated rows and lats, cues given not to allow shoulders to protract. Tactile cues to upper back to prevent postural sway with cervical retractions.    Personal Factors and Comorbidities Comorbidity 2    Comorbidities HTN, abdominal aortic aneurysm    Examination-Activity Limitations Locomotion Level;Lift;Stand;Stairs    Stability/Clinical Decision Making Stable/Uncomplicated    Rehab Potential Good    PT Frequency 2x / week    PT Duration 8 weeks    PT Treatment/Interventions ADLs/Self Care Home  Management;Electrical Stimulation;Iontophoresis 38m/ml Dexamethasone;Moist Heat;Neuromuscular re-education;Therapeutic exercise;Therapeutic activities;Gait training;Stair training;Patient/family education;Manual techniques;Dry needling    PT Next Visit Plan functional endurance, possible D/C           Patient will benefit from skilled therapeutic intervention in order to improve the following deficits and impairments:  Abnormal gait,Difficulty walking,Decreased endurance,Increased muscle spasms,Decreased activity tolerance,Pain,Hypomobility,Impaired flexibility,Decreased strength,Postural dysfunction  Visit Diagnosis: Difficulty in walking, not elsewhere classified  Chronic bilateral low back pain without sciatica  Cervicalgia     Problem List Patient Active Problem List   Diagnosis Date Noted  . Hyperglycemia 04/09/2020  . Chronic neck pain 04/09/2020  . Coronary artery disease due to calcified coronary lesion 12/27/2018  . Osteopenia 02/24/2017  . Abdominal aortic aneurysm (AAA) without rupture ([place] 07/06/2015  . Basal cell carcinoma 07/06/2015  . Essential hypertension 07/06/2015  . History of colon polyps 07/06/2015  . HLD (hyperlipidemia) 07/06/2015    RScot Jun PTA 06/16/2020, 12:12 PM  [place] [address]Phone: [phone]  Fax:  [phone] Name: Jeffrey StradlingMRN: [id number] of Birth: [birth year]

## 2020-06-17 DIAGNOSIS — Z0120 Encounter for dental examination and cleaning without abnormal findings: Secondary | ICD-10-CM | POA: Diagnosis not present

## 2020-06-18 ENCOUNTER — Other Ambulatory Visit: Payer: Self-pay

## 2020-06-18 ENCOUNTER — Ambulatory Visit: Payer: Medicare Other | Admitting: Physical Therapy

## 2020-06-18 ENCOUNTER — Encounter: Payer: Self-pay | Admitting: Physical Therapy

## 2020-06-18 DIAGNOSIS — M542 Cervicalgia: Secondary | ICD-10-CM | POA: Diagnosis not present

## 2020-06-18 DIAGNOSIS — R262 Difficulty in walking, not elsewhere classified: Secondary | ICD-10-CM | POA: Diagnosis not present

## 2020-06-18 DIAGNOSIS — M545 Low back pain, unspecified: Secondary | ICD-10-CM | POA: Diagnosis not present

## 2020-06-18 DIAGNOSIS — G8929 Other chronic pain: Secondary | ICD-10-CM

## 2020-06-18 NOTE — Therapy (Signed)
[place]. [place], [place], [address] Phone: [phone]   Fax:  [phone]  Physical Therapy Treatment  Patient Details  Name: Jeffrey [name] MRN: [id number] Date of Birth: 1941/01/25 Referring Provider (PT): Marikay Alar Date: 06/18/2020   PT End of Session - 06/18/20 1206    Visit Number 13    Date for PT Re-Evaluation 06/22/20    PT Start Time 1130    PT Stop Time 1209    PT Time Calculation (min) 39 min    Activity Tolerance Patient tolerated treatment well    Behavior During Therapy [facility] for tasks assessed/performed           Past Medical History:  Diagnosis Date  . AAA (abdominal aortic aneurysm) ([place])   . Allergy   . Arthritis   . Colon polyps   . Coronary artery calcification   . Eczematous dermatitis   . GERD (gastroesophageal reflux disease)   . Hypertension   . Iliac artery stenosis, left ([place])   . Osteopenia     Past Surgical History:  Procedure Laterality Date  . ABDOMINAL AORTIC ENDOVASCULAR FENESTRATED STENT GRAFT N/A 03/21/2018   Procedure: ABDOMINAL AORTIC ENDOVASCULAR FENESTRATED STENT GRAFT ; [place] CT PERFORMED;  Surgeon: [name], MD;  Location: [facility];  Service: Vascular;  Laterality: N/A;  . Basal Skin Cancer  2012  . CATARACT EXTRACTION Bilateral   . TONSILLECTOMY    . TRANSURETHRAL RESECTION OF PROSTATE      There were no vitals filed for this visit.   Subjective Assessment - 06/18/20 1130    Subjective "OK, 2 neck,4 back"    Currently in Pain? Yes    Pain Score 4     Pain Location Back                             [facility] Adult PT Treatment/Exercise - 06/18/20 0001      Neck Exercises: Seated   Neck Retraction 20 reps;3 secs    Other Seated Exercise ER with scap retraction red TB 2x10, Horiz Abd red 2x10      Lumbar Exercises: Aerobic   UBE (Upper Arm Bike) L2 x2 min each    Nustep L5 44mns      Lumbar Exercises: Machines for  Strengthening   Cybex Knee Extension 15lb 2x10    Cybex Knee Flexion 35lb 2x10    Other Lumbar Machine Exercise Rows & Lats 45lb 2x10      Lumbar Exercises: Seated   Sit to Stand 20 reps   OHP yellow ball                   PT Short Term Goals - 04/28/20 1232      PT SHORT TERM GOAL #1   Title Pt will be I with initial HEP    Status Achieved             PT Long Term Goals - 06/18/20 1207      PT LONG TERM GOAL #1   Title Pt will be I with advanced HEP    Status Achieved      PT LONG TERM GOAL #2   Status Achieved      PT LONG TERM GOAL #3   Title Pt will report able to walk >/= 1 mile with no increase in LBP/cervical pain    Status Partially Met  Plan - 06/18/20 1207    Clinical Impression Statement most goals met. Pt reports that he is pleased with his current functional status. He still reports some neck and back pain but has an MRI schedules in january. All interventions completed well, some fatigue with sit to stands and OHP.    Personal Factors and Comorbidities Comorbidity 2    Comorbidities HTN, abdominal aortic aneurysm    Examination-Activity Limitations Bathing    Stability/Clinical Decision Making Stable/Uncomplicated    Rehab Potential Good    PT Frequency 2x / week    PT Treatment/Interventions ADLs/Self Care Home Management;Electrical Stimulation;Iontophoresis 36m/ml Dexamethasone;Moist Heat;Neuromuscular re-education;Therapeutic exercise;Therapeutic activities;Gait training;Stair training;Patient/family education;Manual techniques;Dry needling    PT Next Visit Plan D/C PT           Patient will benefit from skilled therapeutic intervention in order to improve the following deficits and impairments:  Abnormal gait,Difficulty walking,Decreased endurance,Increased muscle spasms,Decreased activity tolerance,Pain,Hypomobility,Impaired flexibility,Decreased strength,Postural dysfunction  Visit Diagnosis: Chronic bilateral  low back pain without sciatica  Cervicalgia  Difficulty in walking, not elsewhere classified     Problem List Patient Active Problem List   Diagnosis Date Noted  . Hyperglycemia 04/09/2020  . Chronic neck pain 04/09/2020  . Coronary artery disease due to calcified coronary lesion 12/27/2018  . Osteopenia 02/24/2017  . Abdominal aortic aneurysm (AAA) without rupture ([place] 07/06/2015  . Basal cell carcinoma 07/06/2015  . Essential hypertension 07/06/2015  . History of colon polyps 07/06/2015  . HLD (hyperlipidemia) 07/06/2015   PHYSICAL THERAPY DISCHARGE SUMMARY  Visits from Start of Care: 13   Plan: Patient agrees to discharge.  Patient goals were partially met. Patient is being discharged due to being pleased with the current functional level.  ?????     RScot Jun PTA 06/18/2020, 12:09 PM  [place] [id number]: [phone]  Fax:  [phone] Name: Jeffrey KattnerMRN: [id number] of Birth: 807/17/1942

## 2020-07-02 ENCOUNTER — Other Ambulatory Visit: Payer: Self-pay

## 2020-07-02 ENCOUNTER — Ambulatory Visit
Admission: RE | Admit: 2020-07-02 | Discharge: 2020-07-02 | Disposition: A | Discharge status: Home / Self Care | Payer: Medicare Other | Source: Ambulatory Visit | Attending: Family Medicine | Admitting: Family Medicine

## 2020-07-02 DIAGNOSIS — M546 Pain in thoracic spine: Secondary | ICD-10-CM

## 2020-07-02 DIAGNOSIS — G8929 Other chronic pain: Secondary | ICD-10-CM

## 2020-07-02 DIAGNOSIS — M47812 Spondylosis without myelopathy or radiculopathy, cervical region: Secondary | ICD-10-CM | POA: Diagnosis not present

## 2020-07-02 DIAGNOSIS — M542 Cervicalgia: Secondary | ICD-10-CM

## 2020-07-02 DIAGNOSIS — S22000A Wedge compression fracture of unspecified thoracic vertebra, initial encounter for closed fracture: Secondary | ICD-10-CM | POA: Diagnosis not present

## 2020-07-02 DIAGNOSIS — M50223 Other cervical disc displacement at C6-C7 level: Secondary | ICD-10-CM | POA: Diagnosis not present

## 2020-07-02 DIAGNOSIS — M47814 Spondylosis without myelopathy or radiculopathy, thoracic region: Secondary | ICD-10-CM | POA: Diagnosis not present

## 2020-07-03 NOTE — Progress Notes (Signed)
MRI cervical spine and thoracic spine show arthritis changes.  No new compression fractures are visible.  Recommend return to clinic to discuss these results in full detail and plan for next treatment options.

## 2020-07-03 NOTE — Progress Notes (Signed)
MRI cervical spine and thoracic spine show arthritis changes.  No new compression fractures are visible.  Recommend return to clinic to discuss these results in full detail and plan for next treatment options.

## 2020-07-08 ENCOUNTER — Other Ambulatory Visit: Payer: Self-pay

## 2020-07-08 ENCOUNTER — Encounter: Payer: Self-pay | Admitting: Physician Assistant

## 2020-07-08 ENCOUNTER — Ambulatory Visit: Payer: Medicare Other | Admitting: Physician Assistant

## 2020-07-08 DIAGNOSIS — Z1283 Encounter for screening for malignant neoplasm of skin: Secondary | ICD-10-CM

## 2020-07-08 DIAGNOSIS — D485 Neoplasm of uncertain behavior of skin: Secondary | ICD-10-CM | POA: Diagnosis not present

## 2020-07-08 DIAGNOSIS — C4491 Basal cell carcinoma of skin, unspecified: Secondary | ICD-10-CM

## 2020-07-08 DIAGNOSIS — C44319 Basal cell carcinoma of skin of other parts of face: Secondary | ICD-10-CM

## 2020-07-08 DIAGNOSIS — L820 Inflamed seborrheic keratosis: Secondary | ICD-10-CM | POA: Diagnosis not present

## 2020-07-08 DIAGNOSIS — L700 Acne vulgaris: Secondary | ICD-10-CM

## 2020-07-08 DIAGNOSIS — L821 Other seborrheic keratosis: Secondary | ICD-10-CM

## 2020-07-08 DIAGNOSIS — D1800 Hemangioma unspecified site: Secondary | ICD-10-CM | POA: Diagnosis not present

## 2020-07-08 DIAGNOSIS — C44310 Basal cell carcinoma of skin of unspecified parts of face: Secondary | ICD-10-CM

## 2020-07-08 HISTORY — DX: Basal cell carcinoma of skin, unspecified: C44.91

## 2020-07-08 NOTE — Patient Instructions (Signed)

## 2020-07-08 NOTE — Progress Notes (Signed)
Jeffrey [name] is a 80 y.o. male who presents to [place] [facility] today for f/u of neck and low back pain and to review his c-spine and t-spine MRIs.  He was last seen by Dr. Georgina Snell on 05/25/20 and noted minimal improvement in his symptoms despite having completed 6 PT sessions.  He was advised to con't PT of which he has now completed 13 sessions.  He was referred for a c-spine and t-spine MRI which he had on 07/02/20.  Since his last visit, pt reports PT helped his neck, but not so much his back. No numbness/tingling in UE.   Diagnostic testing: C-spine and T-spine MRI- 07/02/20; T-spine XR- 05/25/20; C-spine XR- 04/13/20  Pertinent review of systems: No fevers or chills  Relevant historical information: Hypertension, CAD.   Exam:  BP 138/90 (BP Location: Right Arm, Patient Position: Sitting, Cuff Size: Normal)    Pulse (!) 58    Ht 5\' 9"  (1.753 m)    Wt 201 lb 6.4 oz (91.4 kg)    SpO2 98%    BMI 29.74 kg/m  General: Well Developed, well nourished, and in no acute distress.   MSK: Thoracic kyphosis and cervical lordosis.  Nontender spinal midline.  Decreased cervical motion.    Lab and Radiology Results  EXAM: MRI CERVICAL AND THORACIC SPINE WITHOUT CONTRAST  TECHNIQUE: Multiplanar and multiecho pulse sequences of the cervical spine, to include the craniocervical junction and cervicothoracic junction, and the thoracic spine, were obtained without intravenous contrast.  COMPARISON:  05/25/2020.  FINDINGS: MRI CERVICAL SPINE FINDINGS  Alignment: Straightening of lordosis. Grade 1 C3-4 anterolisthesis. Stepwise minimal grade 1 retrolisthesis at the C5-7 levels.  Vertebrae: Partial fusion at the C4-5 level. No focal osseous lesion. Multilevel Modic type 2 endplate degenerative changes most prominent at the C5-6 level.  Cord: Normal signal and morphology.  Posterior Fossa, vertebral arteries: Negative.  Disc levels: Multilevel  desiccation.  C1-2: Mild degenerative changes with small subchondral geodes involving the dens. Trace joint effusion. Degenerative related pannus measuring up to 5-6 mm dorsally without significant narrowing of the craniocervical junction.  C2-3: Central protrusion and bilateral facet hypertrophy. Patent spinal canal and neural foramen.  C3-4: Disc osteophyte complex with superimposed left paracentral protrusion abutting the ventral cord. Uncovertebral and facet hypertrophy. Mild spinal canal, mild right and severe left neural foraminal narrowing.  C4-5: Shallow central protrusion with uncovertebral and facet degenerative spurring. Patent spinal canal and neural foramen.  C5-6: Disc osteophyte complex abutting the ventral cord with uncovertebral and facet hypertrophy. Mild spinal canal and moderate to severe bilateral neural foraminal narrowing.  C6-7: Disc osteophyte complex with central protrusion. Shallow left foraminal protrusion. Uncovertebral and facet hypertrophy. Mild spinal canal and bilateral neural foraminal narrowing.  C7-T1: No significant disc bulge. Patent spinal canal and neural foramen. Bilateral facet hypertrophy.  Paraspinal tissues: Negative.  MRI THORACIC SPINE FINDINGS  Alignment:  Mildly exaggerated kyphosis.  No listhesis.  Vertebrae: Sequela of chronic T6 and T7 anterior wedge compression deformities with mild to moderate height loss at the T6 level and severe height loss at the T7 level. No significant retropulsion. Remaining vertebral body heights within the thoracic spine are preserved. Susceptibility artifact limits evaluation at the T12 level.  Normal bone marrow signal intensity.  No focal osseous lesion.  Cord:  Normal signal and morphology.  Paraspinal and other soft tissues: Negative.  Disc levels:  Multilevel desiccation however disc spaces are grossly preserved.  Multilevel facet hypertrophy. Multilevel small  posterior protrusions/bulges without significant spinal canal narrowing. Patent neural foramen.  IMPRESSION: MRI cervical spine:  Multilevel spondylosis. Mild spinal canal narrowing at the C3-4, C5-7 levels.  Moderate to severe left C3-4, bilateral C5-6 neural foraminal narrowing.  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 No significant retropulsion.  Multilevel spondylosis.  Patent spinal canal and neural foramen.   Electronically Signed   By: Primitivo Gauze M.D.   On: 07/02/2020 13:37 . I, Lynne Leader, personally (independently) visualized and performed the interpretation of the images attached in this note.     Assessment and Plan: 80 y.o. male with chronic cervical and thoracic pain.  Majority of pain is in the upper thoracic spine or at the lower cervical spine region.  He has had trials of physical therapy where he experienced more benefit in his cervical spine and thoracic spine.  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  At this point his best option is possible facet injection or facet medial branch block and ablation.  Most likely targets would be in his thoracic spine however there is no obvious clear target.  Anticipate this may be a prolonged course.  He is a good candidate for pain management referral to explore the various facet injection or other interventional options.  After discussion of options with patient will refer to pain management to proceed with interventional management strategy.   PDMP not reviewed this encounter. Orders Placed This Encounter  Procedures   Ambulatory referral to Pain Clinic    Referral Priority:   Routine    Referral Type:   Consultation    Referral Reason:   Specialty Services Required    Requested Specialty:   Pain Medicine    Number of Visits Requested:    1   No orders of the defined types were placed in this encounter.    Discussed warning signs or symptoms. Please see discharge instructions. Patient expresses understanding.   The above documentation has been reviewed and is accurate and complete Lynne Leader, M.D.  Total encounter time 20 minutes including face-to-face time with the patient and, reviewing past medical record, and charting on the date of service.   MRI findings treatment plan and options.

## 2020-07-09 ENCOUNTER — Ambulatory Visit: Payer: Medicare Other | Admitting: Family Medicine

## 2020-07-09 ENCOUNTER — Ambulatory Visit: Payer: Medicare Other | Admitting: Physician Assistant

## 2020-07-09 VITALS — BP 138/90 | HR 58 | Ht 69.0 in | Wt 201.4 lb

## 2020-07-09 DIAGNOSIS — M542 Cervicalgia: Secondary | ICD-10-CM

## 2020-07-09 DIAGNOSIS — M546 Pain in thoracic spine: Secondary | ICD-10-CM | POA: Diagnosis not present

## 2020-07-09 DIAGNOSIS — G8929 Other chronic pain: Secondary | ICD-10-CM | POA: Diagnosis not present

## 2020-07-09 NOTE — Patient Instructions (Signed)
Thank you for coming in today.  I do recommend that you get a CD made of the MRI images. Please call [phone] and ask them to make you a CD.    I am referring you to pain management to consider facet joint ablation.   Let me know how you are doing.   I do not see anything really bad on the MRI.    Facet Joint Block The facet joints connect the bones of the spine (vertebrae). They make it possible for you to bend, twist, and make other movements with your spine. They also keep you from bending too far, twisting too far, and making other extreme movements. A facet joint block is a procedure in which a numbing medicine (anesthetic) is injected into a facet joint. In many cases, an anti-inflammatory medicine (steroid) is also injected. A facet joint block may be done:  To diagnose neck or back pain. If the pain gets better after a facet joint block, it means the pain is probably coming from the facet joint. If the pain does not get better, it means the pain is probably not coming from the facet joint.  To relieve neck or back pain that is caused by an inflamed facet joint. A facet joint block is only done to relieve pain if the pain does not improve with other methods, such as medicine, exercise programs, and physical therapy. Tell a health care provider about:  Any allergies you have.  All medicines you are taking, including vitamins, herbs, eye drops, creams, and over-the-counter medicines.  Any problems you or family members have had with anesthetic medicines.  Any blood disorders you have.  Any surgeries you have had.  Any medical conditions you have or have had.  Whether you are pregnant or may be pregnant. What are the risks? Generally, this is a safe procedure. However, problems may occur, including:  Bleeding.  Injury to a nerve near the injection site.  Pain at the injection site.  Weakness or numbness in areas controlled by nerves near the injection  site.  Infection.  Temporary fluid retention.  Allergic reactions to medicines or dyes.  Injury to other structures or organs near the injection site. What happens before the procedure? Medicines Ask your health care provider about:  Changing or stopping your regular medicines. This is especially important if you are taking diabetes medicines or blood thinners.  Taking medicines such as aspirin and ibuprofen. These medicines can thin your blood. Do not take these medicines unless your health care provider tells you to take them.  Taking over-the-counter medicines, vitamins, herbs, and supplements. Eating and drinking Follow instructions from your health care provider about eating and drinking, which may include:  8 hours before the procedure - stop eating heavy meals or foods, such as meat, fried foods, or fatty foods.  6 hours before the procedure - stop eating light meals or foods, such as toast or cereal.  6 hours before the procedure - stop drinking milk or drinks that contain milk.  2 hours before the procedure - stop drinking clear liquids. Staying hydrated Follow instructions from your health care provider about hydration, which may include:  Up to 2 hours before the procedure - you may continue to drink clear liquids, such as water, clear fruit juice, black coffee, and plain tea. General instructions  Do not use any products that contain nicotine or tobacco for at least 4-6 weeks before the procedure. These products include cigarettes, e-cigarettes, and chewing tobacco. If  you need help quitting, ask your health care provider.  Plan to have someone take you home from the hospital or clinic.  Ask your health care provider: ? How your surgery site will be marked. ? What steps will be taken to help prevent infection. These may include:  Removing hair at the surgery site.  Washing skin with a germ-killing soap.  Receiving antibiotic medicine. What happens during the  procedure?  You will put on a hospital gown.  You will lie on your stomach on an X-ray table. You may be asked to lie in a different position if an injection will be made in your neck.  Machines will be used to monitor your oxygen levels, heart rate, and blood pressure.  Your skin will be cleaned.  If an injection will be made in your neck, an IV will be inserted into one of your veins. Fluids and medicine will flow directly into your body through the IV.  A numbing medicine (local anesthetic) will be applied to your skin. Your skin may sting or burn for a moment.  A video X-ray machine (fluoroscopy) will be used to find the joint. In some cases, a CT scan may be used.  A contrast dye may be injected into the facet joint area to help find the joint.  When the joint is located, an anesthetic will be injected into the joint through the needle.  Your health care provider will ask you whether you feel pain relief. ? If you feel relief, a steroid may be injected to provide pain relief for a longer period of time. ? If you do not feel relief or feel only partial relief, additional injections of an anesthetic may be made in other facet joints.  The needle will be removed.  Your skin will be cleaned.  A bandage (dressing) will be applied over each injection site. The procedure may vary among health care providers and hospitals.   What happens after the procedure?  Your blood pressure, heart rate, breathing rate, and blood oxygen level will be monitored until you leave the hospital or clinic.  You will lie down and rest for a period of time. Summary  A facet joint block is a procedure in which a numbing medicine (anesthetic) is injected into a facet joint. An anti-inflammatory medicine (stereoid) may also be injected.  Follow instructions from your health care provider about medicines and eating and drinking before the procedure.  Do not use any products that contain nicotine or  tobacco for at least 4-6 weeks before the procedure.  You will lie on your stomach for the procedure, but you may be asked to lie in a different position if an injection will be made in your neck.  When the joint is located, an anesthetic will be injected into the joint through the needle. This information is not intended to replace advice given to you by your health care provider. Make sure you discuss any questions you have with your health care provider. Document Revised: 09/27/2018 Document Reviewed: 05/11/2018 Elsevier Patient Education  2021 [employer].

## 2020-07-17 ENCOUNTER — Other Ambulatory Visit (INDEPENDENT_AMBULATORY_CARE_PROVIDER_SITE_OTHER): Payer: Medicare Other

## 2020-07-17 ENCOUNTER — Other Ambulatory Visit: Payer: Self-pay

## 2020-07-17 DIAGNOSIS — D649 Anemia, unspecified: Secondary | ICD-10-CM

## 2020-07-17 LAB — CBC
HCT: 32.3 % — ABNORMAL LOW (ref 39.0–52.0)
Hemoglobin: 11.5 g/dL — ABNORMAL LOW (ref 13.0–17.0)
MCHC: 35.7 g/dL (ref 30.0–36.0)
MCV: 94 fl (ref 78.0–100.0)
Platelets: 170 10*3/uL (ref 150.0–400.0)
RBC: 3.44 Mil/uL — ABNORMAL LOW (ref 4.22–5.81)
RDW: 14.1 % (ref 11.5–15.5)
WBC: 7.6 10*3/uL (ref 4.0–10.5)

## 2020-07-20 ENCOUNTER — Telehealth: Payer: Self-pay

## 2020-07-20 NOTE — Telephone Encounter (Signed)
Pt is returning call.  

## 2020-07-20 NOTE — Progress Notes (Signed)
Please inform patient of the following:  Blood counts are stable. Do not need to do any further testing at this time.  Jeffrey [name]. [name], MD 07/20/2020 8:11 AM

## 2020-07-21 ENCOUNTER — Encounter: Payer: Self-pay | Admitting: Physician Assistant

## 2020-07-21 NOTE — Progress Notes (Signed)
   Follow-Up Visit   Subjective  Jeffrey [name] is a 80 y.o. male who presents for the following: Follow-up (Patient here today for 6 month follow up. Per patient check place on right inner cheek x 6 months that's bleeding and non healing. Recheck spot on right jaw line x years that's now bleeding and larger.).   The following portions of the chart were reviewed this encounter and updated as appropriate:      Objective  Well appearing patient in no apparent distress; mood and affect are within normal limits.  All skin waist up examined.  Objective  waist up: Head, face & back skin examination- no atypical moles  Objective  Right Parotid Area: Stuck-on, waxy, tan-brown papules and plaques. --Discussed benign etiology and prognosis.   Objective  Right Submandibular Area: Inflamed raised brown papule     Objective  Right Buccal Cheek: Pink pearly papule     Objective  Right Breast:   Objective  Left Abdomen (side) - Upper (2): Multiple Raised red papules   Assessment & Plan  Encounter for screening for malignant neoplasm of skin waist up  Yearly skin check  Seborrheic keratosis Right Parotid Area  Okay to leave if stable  Neoplasm of uncertain behavior of skin (2) Right Submandibular Area  Skin / nail biopsy Type of biopsy: tangential   Informed consent: discussed and consent obtained   Timeout: patient name, date of birth, surgical site, and procedure verified   Procedure prep:  Patient was prepped and draped in usual sterile fashion (Non sterile) Prep type:  Chlorhexidine Anesthesia: the lesion was anesthetized in a standard fashion   Anesthetic:  1% lidocaine w/ epinephrine 1-100,000 local infiltration Instrument used: flexible razor blade   Outcome: patient tolerated procedure well   Post-procedure details: wound care instructions given   Additional details:  Cautery only  Specimen 1 - Surgical pathology Differential Diagnosis: r/o sk  Check  Margins: No  Right Buccal Cheek  Skin / nail biopsy Type of biopsy: tangential   Informed consent: discussed and consent obtained   Timeout: patient name, date of birth, surgical site, and procedure verified   Procedure prep:  Patient was prepped and draped in usual sterile fashion (Non sterile) Prep type:  Chlorhexidine Anesthesia: the lesion was anesthetized in a standard fashion   Anesthetic:  1% lidocaine w/ epinephrine 1-100,000 local infiltration Instrument used: flexible razor blade   Outcome: patient tolerated procedure well   Post-procedure details: wound care instructions given   Additional details:  If + MOHS  Specimen 2 - Surgical pathology Differential Diagnosis: bcc vs scc  Check Margins: No  Blackhead Right Breast  Observe/okay to leave  Hemangioma, unspecified site (2) Left Abdomen (side) - Upper  observe    I, Estephan Gallardo, PA-C, have reviewed all documentation's for this visit.  The documentation on 07/21/20 for the exam, diagnosis, procedures and orders are all accurate and complete.

## 2020-07-21 NOTE — Addendum Note (Signed)
Addended by: Robyne Askew R on: 07/21/2020 01:53 PM   Modules accepted: Level of Service

## 2020-07-22 ENCOUNTER — Telehealth: Payer: Self-pay

## 2020-07-22 NOTE — Telephone Encounter (Signed)
See results note. 

## 2020-07-22 NOTE — Telephone Encounter (Signed)
Phone call to patient with his pathology results.  Path to patient. Records sent to [facility].

## 2020-07-22 NOTE — Telephone Encounter (Signed)
-----   Message from Kelli R [name], PA-C sent at 07/21/2020  1:47 PM EST ----- Mohs r cheek . 

## 2020-07-23 ENCOUNTER — Encounter: Payer: Self-pay | Admitting: Physician Assistant

## 2020-07-28 ENCOUNTER — Encounter: Payer: Self-pay | Admitting: Physician Assistant

## 2020-07-28 NOTE — Telephone Encounter (Signed)
-----   Message from Warren Danes, [place] sent at 07/21/2020  1:47 PM EST ----- Mohs r cheek .

## 2020-07-28 NOTE — Telephone Encounter (Signed)
Referral sent to skin surgery center for MOHS.

## 2020-08-05 DIAGNOSIS — Z79891 Long term (current) use of opiate analgesic: Secondary | ICD-10-CM | POA: Diagnosis not present

## 2020-08-05 DIAGNOSIS — Z79899 Other long term (current) drug therapy: Secondary | ICD-10-CM | POA: Diagnosis not present

## 2020-08-05 DIAGNOSIS — G894 Chronic pain syndrome: Secondary | ICD-10-CM | POA: Diagnosis not present

## 2020-09-03 ENCOUNTER — Other Ambulatory Visit: Payer: Self-pay

## 2020-09-03 ENCOUNTER — Ambulatory Visit (INDEPENDENT_AMBULATORY_CARE_PROVIDER_SITE_OTHER): Payer: Medicare Other | Admitting: Family Medicine

## 2020-09-03 ENCOUNTER — Encounter: Payer: Self-pay | Admitting: Family Medicine

## 2020-09-03 VITALS — BP 144/91 | HR 58 | Temp 98.1°F | Ht 69.0 in | Wt 199.2 lb

## 2020-09-03 DIAGNOSIS — D72829 Elevated white blood cell count, unspecified: Secondary | ICD-10-CM

## 2020-09-03 DIAGNOSIS — I10 Essential (primary) hypertension: Secondary | ICD-10-CM

## 2020-09-03 DIAGNOSIS — R221 Localized swelling, mass and lump, neck: Secondary | ICD-10-CM | POA: Diagnosis not present

## 2020-09-03 DIAGNOSIS — M542 Cervicalgia: Secondary | ICD-10-CM

## 2020-09-03 DIAGNOSIS — D72821 Monocytosis (symptomatic): Secondary | ICD-10-CM | POA: Diagnosis not present

## 2020-09-03 DIAGNOSIS — G8929 Other chronic pain: Secondary | ICD-10-CM

## 2020-09-03 LAB — CBC WITH DIFFERENTIAL/PLATELET
Basophils Absolute: 0 10*3/uL (ref 0.0–0.1)
Basophils Relative: 0.3 % (ref 0.0–3.0)
Eosinophils Absolute: 0 10*3/uL (ref 0.0–0.7)
Eosinophils Relative: 0.4 % (ref 0.0–5.0)
HCT: 32.6 % — ABNORMAL LOW (ref 39.0–52.0)
Hemoglobin: 11.4 g/dL — ABNORMAL LOW (ref 13.0–17.0)
Lymphocytes Relative: 24 % (ref 12.0–46.0)
Lymphs Abs: 1.6 10*3/uL (ref 0.7–4.0)
MCHC: 35.1 g/dL (ref 30.0–36.0)
MCV: 94.4 fl (ref 78.0–100.0)
Monocytes Absolute: 0.9 10*3/uL (ref 0.1–1.0)
Monocytes Relative: 13.2 % — ABNORMAL HIGH (ref 3.0–12.0)
Neutro Abs: 4.3 10*3/uL (ref 1.4–7.7)
Neutrophils Relative %: 62.1 % (ref 43.0–77.0)
Platelets: 152 10*3/uL (ref 150.0–400.0)
RBC: 3.45 Mil/uL — ABNORMAL LOW (ref 4.22–5.81)
RDW: 14.4 % (ref 11.5–15.5)
WBC: 6.9 10*3/uL (ref 4.0–10.5)

## 2020-09-03 NOTE — Progress Notes (Signed)
   Jeffrey [name] is a 81 y.o. male who presents today for an office visit.  Assessment/Plan:  New/Acute Problems: Neck Mass Unclear etiology.  Will check ultrasound.  Check CBC with differential and peripheral smear today as well.  Will likely need referral to surgery depending on results for excision.  Chronic Problems Addressed Today: Essential hypertension Slightly above goal though has been well controlled.  Continue losartan 75 mg daily and amlodipine 5 mg daily.  Chronic neck pain Along with sports medicine and pain medicine.  Symptoms are currently manageable.     Subjective:  HPI:  Patient here with swelling to right neck.  Started about a month ago.  Increased in size over that time.  No injuries or precipitating events.  No pain.        Objective:  Physical Exam: BP (!) 144/91   Pulse (!) 58   Temp 98.1 F (36.7 C) (Temporal)   Ht 5\' 9"  (1.753 m)   Wt 199 lb 3.2 oz (90.4 kg)   SpO2 99%   BMI 29.42 kg/m   Gen: No acute distress, resting comfortably HEENT: Approximately 3cm mass on right neck.  Mobile. CV: Regular rate and rhythm with no murmurs appreciated Pulm: Normal work of breathing, clear to auscultation bilaterally with no crackles, wheezes, or rhonchi Neuro: Grossly normal, moves all extremities Psych: Normal affect and thought content      Jeffrey [name] M. [name], MD 09/03/2020 2:29 PM

## 2020-09-03 NOTE — Assessment & Plan Note (Signed)
Along with sports medicine and pain medicine.  Symptoms are currently manageable.

## 2020-09-03 NOTE — Patient Instructions (Signed)
It was very nice to see you today!  We will place an order for an ultrasound and blood work today.  Take care, Dr [name]  PLEASE NOTE:  If you had any lab tests please let us know if you have not heard back within a few days. You may see your results on mychart before we have a chance to review them but we will give you a call once they are reviewed by [place]. If we ordered any referrals today, please let us know if you have not heard from their office within the next week.   Please try these tips to maintain a healthy lifestyle:   Eat at least 3 REAL meals and 1-2 snacks per day.  Aim for no more than 5 hours between eating.  If you eat breakfast, please do so within one hour of getting up.    Each meal should contain half fruits/vegetables, one quarter protein, and one quarter carbs (no bigger than a computer mouse)   Cut down on sweet beverages. This includes juice, soda, and sweet tea.     Drink at least 1 glass of water with each meal and aim for at least 8 glasses per day   Exercise at least 150 minutes every week.

## 2020-09-03 NOTE — Assessment & Plan Note (Signed)
Slightly above goal though has been well controlled.  Continue losartan 75 mg daily and amlodipine 5 mg daily.

## 2020-09-04 LAB — PATHOLOGIST SMEAR REVIEW

## 2020-09-04 NOTE — Progress Notes (Signed)
Please inform patient of the following:  Labs are all stable. We will contact him with results of ultrasound once we get them back.  Algis Greenhouse. [name], MD 09/04/2020 1:48 PM

## 2020-09-10 ENCOUNTER — Ambulatory Visit
Admission: RE | Admit: 2020-09-10 | Discharge: 2020-09-10 | Disposition: A | Discharge status: Home / Self Care | Payer: Medicare Other | Source: Ambulatory Visit | Attending: Family Medicine | Admitting: Family Medicine

## 2020-09-10 DIAGNOSIS — R221 Localized swelling, mass and lump, neck: Secondary | ICD-10-CM | POA: Diagnosis not present

## 2020-09-11 ENCOUNTER — Other Ambulatory Visit: Payer: Self-pay | Admitting: *Deleted

## 2020-09-11 ENCOUNTER — Other Ambulatory Visit: Payer: Self-pay | Admitting: Family Medicine

## 2020-09-11 ENCOUNTER — Ambulatory Visit (HOSPITAL_COMMUNITY)
Admission: RE | Admit: 2020-09-11 | Discharge: 2020-09-11 | Disposition: A | Discharge status: Home / Self Care | Payer: Medicare Other | Source: Ambulatory Visit | Attending: Family Medicine | Admitting: Family Medicine

## 2020-09-11 ENCOUNTER — Telehealth: Payer: Self-pay

## 2020-09-11 ENCOUNTER — Other Ambulatory Visit: Payer: Self-pay

## 2020-09-11 DIAGNOSIS — J0100 Acute maxillary sinusitis, unspecified: Secondary | ICD-10-CM | POA: Diagnosis not present

## 2020-09-11 DIAGNOSIS — R591 Generalized enlarged lymph nodes: Secondary | ICD-10-CM | POA: Diagnosis not present

## 2020-09-11 DIAGNOSIS — R590 Localized enlarged lymph nodes: Secondary | ICD-10-CM

## 2020-09-11 DIAGNOSIS — J320 Chronic maxillary sinusitis: Secondary | ICD-10-CM | POA: Diagnosis not present

## 2020-09-11 DIAGNOSIS — K115 Sialolithiasis: Secondary | ICD-10-CM | POA: Diagnosis not present

## 2020-09-11 DIAGNOSIS — R599 Enlarged lymph nodes, unspecified: Secondary | ICD-10-CM

## 2020-09-11 LAB — POCT I-STAT CREATININE: Creatinine, Ser: 1.2 mg/dL (ref 0.61–1.24)

## 2020-09-11 MED ORDER — IOHEXOL 300 MG/ML  SOLN
75.0000 mL | Freq: Once | INTRAMUSCULAR | Status: AC | PRN
  Administered 2020-09-11: 75 mL via INTRAVENOUS

## 2020-09-11 NOTE — Progress Notes (Signed)
Please inform patient of the following:  His ultrasound showed an enlarged lymph node. The radiologist recommended we get a CT scan of his neck to look for any further lymph nodes. I will place this order.  Regardless, I think we probably need to have the lymph node biopsied. I recommend urgent referral to surgery to discuss having a biopsy.  Jeffrey [name]. [name], MD 09/11/2020 8:13 AM

## 2020-09-11 NOTE — Progress Notes (Signed)
Left message to return call to our office at their convenience.  

## 2020-09-11 NOTE — Progress Notes (Signed)
Please inform patient of the following:  CT scan shows a single enlarged lymph node. He does have some enlargement at is his tongue base. We need to get him in with ENT for biopsy ASAP. This referral has already been placed.  Jeffrey [name]. [name], MD 09/11/2020 3:31 PM

## 2020-09-11 NOTE — Telephone Encounter (Signed)
Patient returned call for lab results.  

## 2020-09-11 NOTE — Telephone Encounter (Signed)
Lab results give, see results note

## 2020-09-14 ENCOUNTER — Encounter: Payer: Self-pay | Admitting: Family Medicine

## 2020-09-14 NOTE — Telephone Encounter (Signed)
Spoke with patient, patient aware need OV with ENT for evaluation. And possible biopsy. [facility] does not do biopsy. Patient verbalized understanding Will call ENT for appointment

## 2020-09-15 ENCOUNTER — Other Ambulatory Visit (INDEPENDENT_AMBULATORY_CARE_PROVIDER_SITE_OTHER): Payer: Self-pay | Admitting: Otolaryngology

## 2020-09-15 DIAGNOSIS — D487 Neoplasm of uncertain behavior of other specified sites: Secondary | ICD-10-CM | POA: Diagnosis not present

## 2020-09-15 DIAGNOSIS — R846 Abnormal cytological findings in specimens from respiratory organs and thorax: Secondary | ICD-10-CM | POA: Diagnosis not present

## 2020-09-15 DIAGNOSIS — R221 Localized swelling, mass and lump, neck: Secondary | ICD-10-CM | POA: Diagnosis not present

## 2020-09-15 DIAGNOSIS — R590 Localized enlarged lymph nodes: Secondary | ICD-10-CM | POA: Diagnosis not present

## 2020-09-16 ENCOUNTER — Other Ambulatory Visit: Payer: Self-pay | Admitting: Otolaryngology

## 2020-09-18 ENCOUNTER — Encounter (HOSPITAL_BASED_OUTPATIENT_CLINIC_OR_DEPARTMENT_OTHER): Payer: Self-pay | Admitting: Otolaryngology

## 2020-09-18 ENCOUNTER — Other Ambulatory Visit: Payer: Self-pay

## 2020-09-21 ENCOUNTER — Encounter (HOSPITAL_BASED_OUTPATIENT_CLINIC_OR_DEPARTMENT_OTHER)
Admission: RE | Admit: 2020-09-21 | Discharge: 2020-09-21 | Disposition: A | Discharge status: Home / Self Care | Payer: Medicare Other | Source: Ambulatory Visit | Attending: Otolaryngology | Admitting: Otolaryngology

## 2020-09-21 DIAGNOSIS — Z01812 Encounter for preprocedural laboratory examination: Secondary | ICD-10-CM | POA: Diagnosis not present

## 2020-09-21 LAB — BASIC METABOLIC PANEL
Anion gap: 8 (ref 5–15)
BUN: 11 mg/dL (ref 8–23)
CO2: 23 mmol/L (ref 22–32)
Calcium: 9 mg/dL (ref 8.9–10.3)
Chloride: 102 mmol/L (ref 98–111)
Creatinine, Ser: 1.16 mg/dL (ref 0.61–1.24)
GFR, Estimated: >60 mL/min (ref >60)
Glucose, Bld: 86 mg/dL (ref 70–99)
Potassium: 5.1 mmol/L (ref 3.5–5.1)
Sodium: 133 mmol/L — ABNORMAL LOW (ref 135–145)

## 2020-09-22 ENCOUNTER — Other Ambulatory Visit (HOSPITAL_COMMUNITY)
Admission: RE | Admit: 2020-09-22 | Discharge: 2020-09-22 | Disposition: A | Discharge status: Home / Self Care | Payer: Medicare Other | Source: Ambulatory Visit | Attending: Otolaryngology | Admitting: Otolaryngology

## 2020-09-22 DIAGNOSIS — Z01812 Encounter for preprocedural laboratory examination: Secondary | ICD-10-CM | POA: Insufficient documentation

## 2020-09-22 DIAGNOSIS — Z20822 Contact with and (suspected) exposure to covid-19: Secondary | ICD-10-CM | POA: Insufficient documentation

## 2020-09-22 LAB — SARS CORONAVIRUS 2 (TAT 6-24 HRS): SARS Coronavirus 2: NEGATIVE

## 2020-09-25 ENCOUNTER — Encounter (HOSPITAL_BASED_OUTPATIENT_CLINIC_OR_DEPARTMENT_OTHER): Payer: Self-pay | Admitting: Otolaryngology

## 2020-09-25 ENCOUNTER — Other Ambulatory Visit: Payer: Self-pay

## 2020-09-25 ENCOUNTER — Ambulatory Visit (HOSPITAL_BASED_OUTPATIENT_CLINIC_OR_DEPARTMENT_OTHER)
Admission: RE | Admit: 2020-09-25 | Discharge: 2020-09-25 | Disposition: A | Discharge status: Home / Self Care | Payer: Medicare Other | Attending: Otolaryngology | Admitting: Otolaryngology

## 2020-09-25 ENCOUNTER — Encounter (HOSPITAL_BASED_OUTPATIENT_CLINIC_OR_DEPARTMENT_OTHER)
Admission: RE | Disposition: A | Discharge status: Home / Self Care | Payer: Self-pay | Source: Home / Self Care | Attending: Otolaryngology

## 2020-09-25 ENCOUNTER — Ambulatory Visit (HOSPITAL_BASED_OUTPATIENT_CLINIC_OR_DEPARTMENT_OTHER): Payer: Medicare Other | Admitting: Anesthesiology

## 2020-09-25 DIAGNOSIS — Z87891 Personal history of nicotine dependence: Secondary | ICD-10-CM | POA: Insufficient documentation

## 2020-09-25 DIAGNOSIS — I714 Abdominal aortic aneurysm, without rupture: Secondary | ICD-10-CM | POA: Diagnosis not present

## 2020-09-25 DIAGNOSIS — C801 Malignant (primary) neoplasm, unspecified: Secondary | ICD-10-CM | POA: Insufficient documentation

## 2020-09-25 DIAGNOSIS — E785 Hyperlipidemia, unspecified: Secondary | ICD-10-CM | POA: Diagnosis not present

## 2020-09-25 DIAGNOSIS — C490 Malignant neoplasm of connective and soft tissue of head, face and neck: Secondary | ICD-10-CM | POA: Diagnosis not present

## 2020-09-25 DIAGNOSIS — R221 Localized swelling, mass and lump, neck: Secondary | ICD-10-CM | POA: Diagnosis not present

## 2020-09-25 DIAGNOSIS — I10 Essential (primary) hypertension: Secondary | ICD-10-CM | POA: Diagnosis not present

## 2020-09-25 DIAGNOSIS — C770 Secondary and unspecified malignant neoplasm of lymph nodes of head, face and neck: Secondary | ICD-10-CM | POA: Insufficient documentation

## 2020-09-25 DIAGNOSIS — R590 Localized enlarged lymph nodes: Secondary | ICD-10-CM | POA: Diagnosis not present

## 2020-09-25 HISTORY — PX: MASS BIOPSY: SHX5445

## 2020-09-25 SURGERY — BIOPSY, MASS, NECK
Anesthesia: General | Site: Neck | Laterality: Right

## 2020-09-25 MED ORDER — OXYCODONE HCL 5 MG/5ML PO SOLN
5.0000 mg | Freq: Once | ORAL | Status: AC | PRN
Start: 2020-09-25 — End: 2020-09-25

## 2020-09-25 MED ORDER — CEFAZOLIN SODIUM-DEXTROSE 2-4 GM/100ML-% IV SOLN
INTRAVENOUS | Status: AC
  Filled 2020-09-25: qty 100

## 2020-09-25 MED ORDER — LACTATED RINGERS IV SOLN: INTRAVENOUS | Status: DC

## 2020-09-25 MED ORDER — ONDANSETRON HCL 4 MG/2ML IJ SOLN: 4.0000 mg | Freq: Once | INTRAMUSCULAR | Status: DC | PRN

## 2020-09-25 MED ORDER — 0.9 % SODIUM CHLORIDE (POUR BTL) OPTIME
TOPICAL | Status: DC | PRN
  Administered 2020-09-25: 120 mL

## 2020-09-25 MED ORDER — LIDOCAINE-EPINEPHRINE 1 %-1:100000 IJ SOLN
INTRAMUSCULAR | Status: DC | PRN
  Administered 2020-09-25: 1.5 mL

## 2020-09-25 MED ORDER — PROPOFOL 10 MG/ML IV BOLUS
INTRAVENOUS | Status: DC | PRN
  Administered 2020-09-25: 140 mg via INTRAVENOUS

## 2020-09-25 MED ORDER — AMOXICILLIN 875 MG PO TABS: 875.0000 mg | ORAL_TABLET | Freq: Two times a day (BID) | ORAL | 0 refills | Status: AC

## 2020-09-25 MED ORDER — FENTANYL CITRATE (PF) 100 MCG/2ML IJ SOLN
INTRAMUSCULAR | Status: AC
  Filled 2020-09-25: qty 2

## 2020-09-25 MED ORDER — HEMOSTATIC AGENTS (NO CHARGE) OPTIME
TOPICAL | Status: DC | PRN
  Administered 2020-09-25: 1 via TOPICAL

## 2020-09-25 MED ORDER — OXYCODONE HCL 5 MG PO TABS
5.0000 mg | ORAL_TABLET | Freq: Once | ORAL | Status: AC | PRN
  Administered 2020-09-25: 5 mg via ORAL

## 2020-09-25 MED ORDER — FENTANYL CITRATE (PF) 100 MCG/2ML IJ SOLN
25.0000 ug | INTRAMUSCULAR | Status: DC | PRN
Start: 2020-09-25 — End: 2020-09-25

## 2020-09-25 MED ORDER — CEFAZOLIN SODIUM-DEXTROSE 2-3 GM-%(50ML) IV SOLR
INTRAVENOUS | Status: DC | PRN
  Administered 2020-09-25: 2 g via INTRAVENOUS

## 2020-09-25 MED ORDER — OXYCODONE-ACETAMINOPHEN 5-325 MG PO TABS: 1.0000 | ORAL_TABLET | ORAL | 0 refills | Status: AC | PRN

## 2020-09-25 MED ORDER — PROPOFOL 10 MG/ML IV BOLUS
INTRAVENOUS | Status: AC
  Filled 2020-09-25: qty 20

## 2020-09-25 MED ORDER — LIDOCAINE-EPINEPHRINE 1 %-1:100000 IJ SOLN
INTRAMUSCULAR | Status: AC
  Filled 2020-09-25: qty 1

## 2020-09-25 MED ORDER — LIDOCAINE 2% (20 MG/ML) 5 ML SYRINGE
INTRAMUSCULAR | Status: AC
  Filled 2020-09-25: qty 5

## 2020-09-25 MED ORDER — FENTANYL CITRATE (PF) 100 MCG/2ML IJ SOLN
INTRAMUSCULAR | Status: DC | PRN
  Administered 2020-09-25: 25 ug via INTRAVENOUS
  Administered 2020-09-25: 50 ug via INTRAVENOUS
  Administered 2020-09-25 (×2): 25 ug via INTRAVENOUS

## 2020-09-25 MED ORDER — ONDANSETRON HCL 4 MG/2ML IJ SOLN
INTRAMUSCULAR | Status: AC
  Filled 2020-09-25: qty 2

## 2020-09-25 MED ORDER — ONDANSETRON HCL 4 MG/2ML IJ SOLN
INTRAMUSCULAR | Status: DC | PRN
  Administered 2020-09-25: 4 mg via INTRAVENOUS

## 2020-09-25 MED ORDER — EPHEDRINE SULFATE 50 MG/ML IJ SOLN
INTRAMUSCULAR | Status: DC | PRN
  Administered 2020-09-25 (×2): 10 mg via INTRAVENOUS

## 2020-09-25 MED ORDER — DEXAMETHASONE SODIUM PHOSPHATE 4 MG/ML IJ SOLN
INTRAMUSCULAR | Status: DC | PRN
  Administered 2020-09-25: 4 mg via INTRAVENOUS

## 2020-09-25 MED ORDER — OXYCODONE HCL 5 MG PO TABS
ORAL_TABLET | ORAL | Status: AC
  Filled 2020-09-25: qty 1

## 2020-09-25 MED ORDER — LIDOCAINE 2% (20 MG/ML) 5 ML SYRINGE
INTRAMUSCULAR | Status: DC | PRN
  Administered 2020-09-25: 100 mg via INTRAVENOUS

## 2020-09-25 MED ORDER — DEXAMETHASONE SODIUM PHOSPHATE 10 MG/ML IJ SOLN
INTRAMUSCULAR | Status: AC
  Filled 2020-09-25: qty 1

## 2020-09-25 SURGICAL SUPPLY — 77 items
ADH SKN CLS APL DERMABOND .7 (GAUZE/BANDAGES/DRESSINGS)
APL SKNCLS STERI-STRIP NONHPOA (GAUZE/BANDAGES/DRESSINGS)
BENZOIN TINCTURE PRP APPL 2/3 (GAUZE/BANDAGES/DRESSINGS) IMPLANT
BLADE SURG 15 STRL LF DISP TIS (BLADE) ×1 IMPLANT
BLADE SURG 15 STRL SS (BLADE) ×3
CANISTER SUCT 1200ML W/VALVE (MISCELLANEOUS) ×2 IMPLANT
CLEANER CAUTERY TIP 5X5 PAD (MISCELLANEOUS) IMPLANT
CLIP VESOCCLUDE MED 6/CT (CLIP) IMPLANT
CLIP VESOCCLUDE SM WIDE 6/CT (CLIP) IMPLANT
CLOSURE WOUND 1/4X4 (GAUZE/BANDAGES/DRESSINGS)
CORD BIPOLAR FORCEPS 12FT (ELECTRODE) ×2 IMPLANT
COVER BACK TABLE 60X90IN (DRAPES) ×3 IMPLANT
COVER MAYO STAND STRL (DRAPES) ×3 IMPLANT
COVER WAND RF STERILE (DRAPES) IMPLANT
DECANTER SPIKE VIAL GLASS SM (MISCELLANEOUS) ×2 IMPLANT
DERMABOND ADVANCED (GAUZE/BANDAGES/DRESSINGS)
DERMABOND ADVANCED .7 DNX12 (GAUZE/BANDAGES/DRESSINGS) IMPLANT
DRAIN JACKSON RD 7FR 3/32 (WOUND CARE) IMPLANT
DRAIN PENROSE 1/4X12 LTX STRL (WOUND CARE) IMPLANT
DRAPE U-SHAPE 76X120 STRL (DRAPES) ×3 IMPLANT
ELECT COATED BLADE 2.86 ST (ELECTRODE) ×3 IMPLANT
ELECT NDL BLADE 2-5/6 (NEEDLE) IMPLANT
ELECT NEEDLE BLADE 2-5/6 (NEEDLE) IMPLANT
ELECT PAIRED SUBDERMAL (MISCELLANEOUS) ×3
ELECT REM PT RETURN 9FT ADLT (ELECTROSURGICAL) ×3
ELECTRODE PAIRED SUBDERMAL (MISCELLANEOUS) IMPLANT
ELECTRODE REM PT RTRN 9FT ADLT (ELECTROSURGICAL) ×1 IMPLANT
EVACUATOR SILICONE 100CC (DRAIN) IMPLANT
FORCEPS BIPOLAR SPETZLER 8 1.0 (NEUROSURGERY SUPPLIES) ×2 IMPLANT
GAUZE 4X4 16PLY RFD (DISPOSABLE) IMPLANT
GAUZE SPONGE 4X4 12PLY STRL LF (GAUZE/BANDAGES/DRESSINGS) IMPLANT
GLOVE SURG ENC MOIS LTX SZ6.5 (GLOVE) ×2 IMPLANT
GLOVE SURG ENC MOIS LTX SZ7.5 (GLOVE) ×3 IMPLANT
GLOVE SURG UNDER POLY LF SZ6.5 (GLOVE) ×2 IMPLANT
GLOVE SURG UNDER POLY LF SZ7 (GLOVE) ×2 IMPLANT
GOWN STRL REUS W/ TWL LRG LVL3 (GOWN DISPOSABLE) ×2 IMPLANT
GOWN STRL REUS W/TWL LRG LVL3 (GOWN DISPOSABLE) ×6
HEMOSTAT SNOW SURGICEL 2X4 (HEMOSTASIS) ×2 IMPLANT
HEMOSTAT SURGICEL .5X2 ABSORB (HEMOSTASIS) IMPLANT
LOCATOR NERVE 3 VOLT (DISPOSABLE) IMPLANT
NDL HYPO 25X1 1.5 SAFETY (NEEDLE) ×1 IMPLANT
NDL PRECISIONGLIDE 27X1.5 (NEEDLE) IMPLANT
NEEDLE HYPO 25X1 1.5 SAFETY (NEEDLE) ×3 IMPLANT
NEEDLE PRECISIONGLIDE 27X1.5 (NEEDLE) IMPLANT
NS IRRIG 1000ML POUR BTL (IV SOLUTION) ×3 IMPLANT
PACK BASIN DAY SURGERY FS (CUSTOM PROCEDURE TRAY) ×3 IMPLANT
PAD CLEANER CAUTERY TIP 5X5 (MISCELLANEOUS)
PENCIL SMOKE EVACUATOR (MISCELLANEOUS) ×3 IMPLANT
PIN SAFETY STERILE (MISCELLANEOUS) IMPLANT
PROBE NERVBE PRASS .33 (MISCELLANEOUS) ×2 IMPLANT
SHEARS HARMONIC 9CM CVD (BLADE) ×2 IMPLANT
SLEEVE SCD COMPRESS KNEE MED (STOCKING) IMPLANT
SPONGE GAUZE 2X2 8PLY STER LF (GAUZE/BANDAGES/DRESSINGS)
SPONGE GAUZE 2X2 8PLY STRL LF (GAUZE/BANDAGES/DRESSINGS) IMPLANT
SPONGE INTESTINAL PEANUT (DISPOSABLE) ×2 IMPLANT
STAPLER VISISTAT 35W (STAPLE) IMPLANT
STRIP CLOSURE SKIN 1/4X4 (GAUZE/BANDAGES/DRESSINGS) IMPLANT
SUCTION FRAZIER HANDLE 10FR (MISCELLANEOUS)
SUCTION TUBE FRAZIER 10FR DISP (MISCELLANEOUS) IMPLANT
SUT ETHILON 3 0 PS 1 (SUTURE) IMPLANT
SUT ETHILON 4 0 PS 2 18 (SUTURE) IMPLANT
SUT PROLENE 5 0 P 3 (SUTURE) IMPLANT
SUT SILK 3 0 TIES 17X18 (SUTURE)
SUT SILK 3-0 18XBRD TIE BLK (SUTURE) IMPLANT
SUT SILK 4 0 TIES 17X18 (SUTURE) ×2 IMPLANT
SUT VIC AB 3-0 FS2 27 (SUTURE) IMPLANT
SUT VIC AB 4-0 P-3 18XBRD (SUTURE) IMPLANT
SUT VIC AB 4-0 P3 18 (SUTURE)
SUT VIC AB 4-0 RB1 27 (SUTURE)
SUT VIC AB 4-0 RB1 27X BRD (SUTURE) IMPLANT
SUT VICRYL 4-0 PS2 18IN ABS (SUTURE) ×3 IMPLANT
SYR BULB EAR ULCER 3OZ GRN STR (SYRINGE) ×2 IMPLANT
SYR CONTROL 10ML LL (SYRINGE) ×3 IMPLANT
TOWEL GREEN STERILE FF (TOWEL DISPOSABLE) ×5 IMPLANT
TRAY DSU PREP LF (CUSTOM PROCEDURE TRAY) ×3 IMPLANT
TUBE CONNECTING 20'X1/4 (TUBING) ×1
TUBE CONNECTING 20X1/4 (TUBING) ×1 IMPLANT

## 2020-09-25 NOTE — Op Note (Signed)
DATE OF PROCEDURE: 09/25/2020  OPERATIVE REPORT   SURGEON: Leta Baptist, MD  PREOPERATIVE DIAGNOSIS:Right neck mass  POSTOPERATIVE DIAGNOSIS: Right neck mass  PROCEDURES PERFORMED: 1. Excision of deep right neck lymph nodes.  ANESTHESIA: General laryngeal mask anesthesia.  COMPLICATIONS: None.  ESTIMATED BLOOD LOSS: Minimal.  INDICATION FOR PROCEDURE:  Jeffrey [name] is a 80 y.o. male with a history of a right neck mass. The patient first noted the mass 2 months ago. He believes it is getting larger. The patient underwent a neck ultrasound and CT which showed a 2.7 cm right level II lymph node. 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  The option of open biopsy was discussed with the patient.  The patient would like to have the entire mass excised.  Based on the above findings, the decision was made for the patient to undergo the above-stated procedures. The risks, benefits, alternatives, and details of the procedures were discussed with the mother. Questions were invited and answered. Informed consent was obtained.  DESCRIPTION OF PROCEDURE: The patient was taken to the operating room and placed supine on the operating table. General laryngeal mask anesthesia was induced by the anesthesiologist. The patient was positioned and prepped and draped in a standard fashion for right neck surgery.  Palpation on the right neck revealed a 3 cm right level 2 mass.  The facial nerve monitoring electrodes were placed.  1% lidocaine with 1-100,000 epinephrine was infiltrated at the planned site of incision.  A transverse 4 cm incision was made over the neck mass.  The incision was carried down to the level of the platysma muscles.  Subplatysmal flaps were elevated superiorly and inferiorly.  The sternocleidomastoid muscles were then retracted laterally, exposing a deep right level 2 neck mass.  The mass was approximately 3 cm in diameter.  Careful dissection was then  carried out to free the neck mass from the surrounding structures, including the internal jugular vein and carotid artery.  The mass was sent to the pathology department for permanent histologic identification.  The surgical site was copiously irrigated.  The incision was closed in layers with 4-0 Vicryl and Dermabond.  The care of the patient was turned over to the anesthesiologist. The patient was awakened from anesthesia without difficulty. He was extubated and transferred to the recovery room in good condition.  OPERATIVE FINDINGS: A 3 cm deep right level 2 lymph node.  SPECIMEN: Right level 2 neck mass.  FOLLOWUP CARE: The patient will be discharged home once he is awake and alert. He will follow up in my office in 1 week.

## 2020-09-25 NOTE — Anesthesia Postprocedure Evaluation (Signed)
Anesthesia Post Note  Patient: Jeffrey [name]  Procedure(s) Performed: OPEN NECK MASS BIOPSY (Right Neck)     Patient location during evaluation: PACU Anesthesia Type: General Level of consciousness: awake and alert Pain management: pain level controlled Vital Signs Assessment: post-procedure vital signs reviewed and stable Respiratory status: spontaneous breathing, nonlabored ventilation and respiratory function stable Cardiovascular status: blood pressure returned to baseline and stable Postop Assessment: no apparent nausea or vomiting Anesthetic complications: no   No complications documented.  Last Vitals:  Vitals:   09/25/20 1202 09/25/20 1215  BP: (!) 154/83 (!) 152/72  Pulse: 72 69  Resp: 18 (!) 23  Temp: 36.5 C   SpO2: 99% 95%    Last Pain:  Vitals:   09/25/20 1233  TempSrc:   PainSc: 3                  Catalina Gravel

## 2020-09-25 NOTE — Anesthesia Procedure Notes (Signed)
Procedure Name: LMA Insertion Date/Time: 09/25/2020 10:12 AM Performed by: Maryella Shivers, CRNA Pre-anesthesia Checklist: Patient identified, Emergency Drugs available, Suction available and Patient being monitored Patient Re-evaluated:Patient Re-evaluated prior to induction Oxygen Delivery Method: Circle system utilized Preoxygenation: Pre-oxygenation with 100% oxygen Induction Type: IV induction Ventilation: Mask ventilation without difficulty LMA: LMA inserted LMA Size: 5.0 Number of attempts: 1 Airway Equipment and Method: Bite block Placement Confirmation: positive ETCO2 Tube secured with: Tape Dental Injury: Teeth and Oropharynx as per pre-operative assessment

## 2020-09-25 NOTE — H&P (Addendum)
Cc: Right neck mass  HPI: The patient is a 80 y/o male who presents today for evaluation of a right neck mass. The patient first noted the mass 2 months ago. He believes it is getting a little larger. The patient underwent a neck ultrasound and CT which showed a 2.7 cm right level II lymph node. Asymmetric soft tissue was also noted at the right tongue base. The patient currently denies dysphagia or odynophagia. He quit smoking over 30 years ago. No other ENT, GI, or respiratory issue noted since the last visit.   Exam: General: Communicates without difficulty, well nourished, no acute distress. Head: Normocephalic, no evidence injury, no tenderness, facial buttresses intact without stepoff. Eyes: PERRL, EOMI. No scleral icterus, conjunctivae clear. Neuro: CN II exam reveals vision grossly intact.  No nystagmus at any point of gaze. Ears: Auricles well formed without lesions.  Ear canals are intact without mass or lesion.  No erythema or edema is appreciated.  The TMs are intact without fluid. Nose: External evaluation reveals normal support and skin without lesions.  Dorsum is intact.  Anterior rhinoscopy reveals healthy pink mucosa over anterior aspect of inferior turbinates and intact septum.  No purulence noted. Oral:  Oral cavity and oropharynx are intact, symmetric, without erythema or edema.  Mucosa is moist without lesions. Neck: Full range of motion without pain.  A >2 cm level II neck mass is noted.   Thyroid bed within normal limits to palpation.  Parotid glands and submandibular glands equal bilaterally without mass.  Trachea is midline. Neuro:  CN 2-12 grossly intact. Gait normal. Vestibular: No nystagmus at any point of gaze. The cerebellar examination is unremarkable.   Assessment  1. A 2.7 cm right level II neck mass is noted.  2. Lingual tonsils are noted on laryngoscopy exam but without ulceration. No other suspicious mass or lesion is noted on today's fiberoptic laryngoscopy  exam.  Plan 1. The physical exam, laryngoscopy, and CT findings are reviewed with the patient.  2. FNA biopsy of right neck mass is obtained without difficulty. 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 3. Open biopsy is recommended.

## 2020-09-25 NOTE — Transfer of Care (Signed)
Immediate Anesthesia Transfer of Care Note  Patient: Jeffrey [name]  Procedure(s) Performed: OPEN NECK MASS BIOPSY (Right Neck)  Patient Location: PACU  Anesthesia Type:General  Level of Consciousness: sedated  Airway & Oxygen Therapy: Patient Spontanous Breathing and Patient connected to face mask oxygen  Post-op Assessment: Report given to RN and Post -op Vital signs reviewed and stable  Post vital signs: Reviewed and stable  Last Vitals:  Vitals Value Taken Time  BP 154/83 09/25/20 1201  Temp    Pulse 74 09/25/20 1203  Resp 19 09/25/20 1203  SpO2 100 % 09/25/20 1203  Vitals shown include unvalidated device data.  Last Pain:  Vitals:   09/25/20 0844  TempSrc: Oral  PainSc: 0-No pain      Patients Stated Pain Goal: 2 (49/61/16 [id number])  Complications: No complications documented.

## 2020-09-25 NOTE — Anesthesia Preprocedure Evaluation (Signed)
Anesthesia Evaluation  Patient identified by MRN, date of birth, ID band Patient awake    Reviewed: Allergy & Precautions, NPO status , Patient's Chart, lab work & pertinent test results  Airway Mallampati: II  TM Distance: >3 FB Neck ROM: Full    Dental no notable dental hx.    Pulmonary former smoker,    Pulmonary exam normal breath sounds clear to auscultation       Cardiovascular hypertension, Pt. 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 Elevated Coronary calcium score  EKG 02/03/20 Sinus Bradycardia   Neuro/Psych negative neurological ROS  negative psych ROS   GI/Hepatic Neg liver ROS, GERD  Medicated,  Endo/Other  Hyperlipidemia  Renal/GU negative Renal ROS  negative genitourinary   Musculoskeletal  (+) Arthritis , Osteoarthritis,  Right neck mass   Abdominal   Peds  Hematology negative hematology ROS (+)   Anesthesia Other Findings   Reproductive/Obstetrics                             Anesthesia Physical Anesthesia Plan  ASA: III  Anesthesia Plan: General   Post-op Pain Management:    Induction: Intravenous  PONV Risk Score and Plan: 3 and Treatment may vary due to age or medical condition and Ondansetron  Airway Management Planned: LMA  Additional Equipment:   Intra-op Plan:   Post-operative Plan: Extubation in OR  Informed Consent: I have reviewed the patients History and Physical, chart, labs and discussed the procedure including the risks, benefits and alternatives for the proposed anesthesia with the patient or authorized representative who has indicated his/her understanding and acceptance.     Dental advisory given  Plan Discussed with: CRNA and Anesthesiologist  Anesthesia Plan Comments:         Anesthesia Quick  Evaluation

## 2020-09-25 NOTE — Discharge Instructions (Addendum)
The patient may resume his previous diet.  He is instructed not to lift anything greater than 20 pounds for the next 5 days.  He may take shower or bath as usual.  He will follow-up in my office in 1 week.  Post Anesthesia Home Care Instructions  Activity: Get plenty of rest for the remainder of the day. A responsible individual must stay with you for 24 hours following the procedure.  For the next 24 hours, DO NOT: -Drive a car -Paediatric nurse -Drink alcoholic beverages -Take any medication unless instructed by your physician -Make any legal decisions or sign important papers.  Meals: Start with liquid foods such as gelatin or soup. Progress to regular foods as tolerated. Avoid greasy, spicy, heavy foods. If nausea and/or vomiting occur, drink only clear liquids until the nausea and/or vomiting subsides. Call your physician if vomiting continues.  Special Instructions/Symptoms: Your throat may feel dry or sore from the anesthesia or the breathing tube placed in your throat during surgery. If this causes discomfort, gargle with warm salt water. The discomfort should disappear within 24 hours.  If you had a scopolamine patch placed behind your ear for the management of post- operative nausea and/or vomiting:  1. The medication in the patch is effective for 72 hours, after which it should be removed.  Wrap patch in a tissue and discard in the trash. Wash hands thoroughly with soap and water. 2. You may remove the patch earlier than 72 hours if you experience unpleasant side effects which may include dry mouth, dizziness or visual disturbances. 3. Avoid touching the patch. Wash your hands with soap and water after contact with the patch.

## 2020-09-28 ENCOUNTER — Other Ambulatory Visit (HOSPITAL_COMMUNITY): Payer: Self-pay | Admitting: Otolaryngology

## 2020-09-28 ENCOUNTER — Other Ambulatory Visit: Payer: Self-pay | Admitting: Otolaryngology

## 2020-09-28 DIAGNOSIS — C760 Malignant neoplasm of head, face and neck: Secondary | ICD-10-CM

## 2020-09-29 ENCOUNTER — Encounter (HOSPITAL_BASED_OUTPATIENT_CLINIC_OR_DEPARTMENT_OTHER): Payer: Self-pay | Admitting: Otolaryngology

## 2020-10-01 DIAGNOSIS — C44319 Basal cell carcinoma of skin of other parts of face: Secondary | ICD-10-CM | POA: Diagnosis not present

## 2020-10-04 ENCOUNTER — Other Ambulatory Visit: Payer: Self-pay | Admitting: Cardiology

## 2020-10-05 NOTE — Telephone Encounter (Signed)
Rx has been sent to the pharmacy electronically. ° °

## 2020-10-12 ENCOUNTER — Ambulatory Visit (HOSPITAL_COMMUNITY)
Admission: RE | Admit: 2020-10-12 | Discharge: 2020-10-12 | Disposition: A | Discharge status: Home / Self Care | Payer: Medicare Other | Source: Ambulatory Visit | Attending: Otolaryngology | Admitting: Otolaryngology

## 2020-10-12 ENCOUNTER — Other Ambulatory Visit: Payer: Self-pay

## 2020-10-12 DIAGNOSIS — C760 Malignant neoplasm of head, face and neck: Secondary | ICD-10-CM | POA: Diagnosis not present

## 2020-10-12 LAB — GLUCOSE, CAPILLARY: Glucose-Capillary: 98 mg/dL (ref 70–99)

## 2020-10-12 MED ORDER — FLUDEOXYGLUCOSE F - 18 (FDG) INJECTION
9.9000 | Freq: Once | INTRAVENOUS | Status: AC
  Administered 2020-10-12: 9.94 via INTRAVENOUS

## 2020-10-13 ENCOUNTER — Telehealth: Payer: Self-pay | Admitting: Radiation Oncology

## 2020-10-13 ENCOUNTER — Other Ambulatory Visit: Payer: Self-pay

## 2020-10-13 DIAGNOSIS — C109 Malignant neoplasm of oropharynx, unspecified: Secondary | ICD-10-CM

## 2020-10-13 NOTE — Progress Notes (Signed)
Oncology Nurse Navigator Documentation  At Dr. Pearlie Oyster request I have called [facility] Pathology and requested that p16 testing be added to his pathology from 09/25/20. I also requested for the Pathologist to document if there was ECE on the pathology as well. I spoke with Varney Biles and she verbalized she would notify the Pathologist. I will follow for results.   Harlow Asa RN, BSN, OCN Head & Neck Oncology Nurse [place] at [facility] Phone # [phone]  Fax # [phone]

## 2020-10-13 NOTE — Telephone Encounter (Signed)
LVM for patient to return my call to get him scheduled with Dr. Eppie Gibson for radiation.

## 2020-10-14 ENCOUNTER — Telehealth: Payer: Self-pay | Admitting: Radiation Oncology

## 2020-10-14 NOTE — Telephone Encounter (Signed)
Patient called concerned that he couldn't find Dr. Isidore Moos as in network under his [facility] plan's website. I have asked both Claire Shown and [employer] and they've told me that Dr. Isidore Moos is in network. I gave patient Dr. Pearlie Oyster NPI number and he was able to get a [employer] rep to confirm that Dr. Isidore Moos is in network.

## 2020-10-16 LAB — SURGICAL PATHOLOGY

## 2020-10-17 ENCOUNTER — Encounter: Payer: Self-pay | Admitting: Hematology and Oncology

## 2020-10-20 ENCOUNTER — Ambulatory Visit
Admission: RE | Admit: 2020-10-20 | Discharge: 2020-10-20 | Disposition: A | Discharge status: Home / Self Care | Payer: Medicare Other | Source: Ambulatory Visit | Attending: Radiation Oncology | Admitting: Radiation Oncology

## 2020-10-20 ENCOUNTER — Inpatient Hospital Stay: Payer: Medicare Other | Attending: Hematology and Oncology | Admitting: Hematology and Oncology

## 2020-10-20 ENCOUNTER — Other Ambulatory Visit: Payer: Self-pay

## 2020-10-20 ENCOUNTER — Inpatient Hospital Stay: Payer: Medicare Other

## 2020-10-20 VITALS — BP 169/79 | HR 69 | Temp 97.9°F | Resp 18 | Ht 69.0 in | Wt 203.7 lb

## 2020-10-20 DIAGNOSIS — C109 Malignant neoplasm of oropharynx, unspecified: Secondary | ICD-10-CM

## 2020-10-20 DIAGNOSIS — C01 Malignant neoplasm of base of tongue: Secondary | ICD-10-CM | POA: Insufficient documentation

## 2020-10-20 DIAGNOSIS — Z87891 Personal history of nicotine dependence: Secondary | ICD-10-CM | POA: Insufficient documentation

## 2020-10-20 DIAGNOSIS — Z5111 Encounter for antineoplastic chemotherapy: Secondary | ICD-10-CM | POA: Diagnosis not present

## 2020-10-20 DIAGNOSIS — Z800 Family history of malignant neoplasm of digestive organs: Secondary | ICD-10-CM | POA: Diagnosis not present

## 2020-10-20 LAB — CMP (CANCER CENTER ONLY)
ALT: 15 U/L (ref 0–44)
AST: 22 U/L (ref 15–41)
Albumin: 4.2 g/dL (ref 3.5–5.0)
Alkaline Phosphatase: 79 U/L (ref 38–126)
Anion gap: 11 (ref 5–15)
BUN: 16 mg/dL (ref 8–23)
CO2: 23 mmol/L (ref 22–32)
Calcium: 9.4 mg/dL (ref 8.9–10.3)
Chloride: 102 mmol/L (ref 98–111)
Creatinine: 1.07 mg/dL (ref 0.61–1.24)
GFR, Estimated: >60 mL/min (ref >60)
Glucose, Bld: 102 mg/dL — ABNORMAL HIGH (ref 70–99)
Potassium: 4.3 mmol/L (ref 3.5–5.1)
Sodium: 136 mmol/L (ref 135–145)
Total Bilirubin: 0.7 mg/dL (ref 0.3–1.2)
Total Protein: 7.9 g/dL (ref 6.5–8.1)

## 2020-10-20 LAB — CBC WITH DIFFERENTIAL (CANCER CENTER ONLY)
Abs Immature Granulocytes: 0.05 10*3/uL (ref 0.00–0.07)
Basophils Absolute: 0 10*3/uL (ref 0.0–0.1)
Basophils Relative: 0 %
Eosinophils Absolute: 0 10*3/uL (ref 0.0–0.5)
Eosinophils Relative: 1 %
HCT: 32.8 % — ABNORMAL LOW (ref 39.0–52.0)
Hemoglobin: 11.2 g/dL — ABNORMAL LOW (ref 13.0–17.0)
Immature Granulocytes: 1 %
Lymphocytes Relative: 19 %
Lymphs Abs: 1.4 10*3/uL (ref 0.7–4.0)
MCH: 32.5 pg (ref 26.0–34.0)
MCHC: 34.1 g/dL (ref 30.0–36.0)
MCV: 95.1 fL (ref 80.0–100.0)
Monocytes Absolute: 0.9 10*3/uL (ref 0.1–1.0)
Monocytes Relative: 13 %
Neutro Abs: 4.7 10*3/uL (ref 1.7–7.7)
Neutrophils Relative %: 66 %
Platelet Count: 165 10*3/uL (ref 150–400)
RBC: 3.45 MIL/uL — ABNORMAL LOW (ref 4.22–5.81)
RDW: 13.5 % (ref 11.5–15.5)
WBC Count: 7.1 10*3/uL (ref 4.0–10.5)
nRBC: 0 % (ref 0.0–0.2)

## 2020-10-20 MED ORDER — OXYMETAZOLINE HCL 0.05 % NA SOLN
1.0000 | Freq: Once | NASAL | Status: AC
  Administered 2020-10-20: 1 via NASAL
  Filled 2020-10-20: qty 30

## 2020-10-20 NOTE — Progress Notes (Signed)
Oncology Nurse Navigator Documentation  Met with patient during initial consult with Dr. Chryl Heck and then with Dr. Isidore Moos.  He was accompanied by his son.  . Further introduced myself as his/their Navigator, explained my role as a member of the Care Team. . Provided New Patient Information packet: o Contact information for physician, this navigator, other members of the Care Team o Advance Directive information ([place] blue pamphlet with LCSW insert); provided [facility] AD booklet at his request,  o Fall Prevention Patient [place] sheet o Symptom Management Clinic information o [facility] map with highlight of [place] o SLP Information sheet . Provided and discussed educational handouts for PEG and PAC. Marland Kitchen Assisted with post-consult appt scheduling. . Discussed the location of Dr. Raynelle Dick office and [facility] Radiology as reference for future appts, including arrival procedure for these appts.   . They verbalized understanding of information provided. . I encouraged them to call with questions/concerns moving forward.  Harlow Asa, RN, BSN, OCN Head & Neck Oncology Nurse [place] at [place] [phone]

## 2020-10-20 NOTE — Progress Notes (Signed)
Radiation Oncology         [phone] ________________________________  Initial Outpatient Consultation  Name: Jeffrey [name] MRN: [id number]  Date: 10/20/2020  DOB: Dec 17, 1940  [id number], Jeffrey Greenhouse, MD  Jeffrey Baptist, MD   REFERRING PHYSICIAN: Leta Baptist, MD  DIAGNOSIS: C01   ICD-10-CM   1. Oropharyngeal cancer (HCC)  C10.9 oxymetazoline (AFRIN) 0.05 % nasal spray 1 spray    Fiberoptic laryngoscopy  2. 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 male who presented with two-month history of right neck mass.  Subsequently, the patient saw Dr. Benjamine [name], who performed a fine needle aspiration of the right right neck mass. Cytology from the procedure revealed rare atypical cells.  Excision of the right neck soft tissue mass on 09/25/2020 revealed: poorly differentiated squamous cell carcinoma. Tumor is p16 +  The nodule is mostly squamous cell carcinoma with a small  amount of lymphoid tissue at the periphery, presumably residual lymph node.  As the tumor has mostly replaced the presumed lymph node, there is almostly certainly extra-nodal extension, the extent of which cannot  be accurately measured due to the marked paucity of possible residual lymph node   I spoke with Dr. Benjamine [name] personally and he reports that he did not see any irregular lesions upon laryngoscopy in the throat.  Pertinent imaging thus far includes: 1 Ultrasound of soft tissue head/neck on 09/10/2020 that showed a 3.7 x 2.0 x 2.8 cm hypoechoic mass at the right level IIa, highly concerning for  an enlarged pathologic lymph node. Primary differential considerations included nodal metastatic disease, lymphoproliferative disorder, or less likely reactive adenopathy.  2. CT scan of soft tissues in neck on 09/11/2020 that showed an enlarged right level 2 node. There was also noted to be asymmetric soft tissue at the right tongue base that was favored to reflect asymmetric tonsillar tissue. 3. PET scan on 10/12/2020 that showed primary mucosal neoplasm in the right tongue base area demonstrating hypermetabolism. The patient was status post excisional biopsy of the enlarged right level 2 lymph node and there were no other enlarged or hypermetabolic neck nodes. There were no PET-CT findings for metastatic disease involving the chest, abdomen, pelvis, or osseous structures.  I have personally reviewed his images   Nutrition Status Yes No Comments  Weight changes? []  [x]    Swallowing concerns? []  [x]    PEG? []  [x]     Referrals Yes No Comments  Social Work? [x]  []    Dentistry? [x]  []  Sees Dr. Chevis [name] at [facility]. regularly  Swallowing therapy? [x]  []    Nutrition? [x]  []    Med/Onc? [x]  []  Dr. Arletha Pili [name]   Safety Issues Yes No Comments  Prior radiation? []  [x]    Pacemaker/ICD? []  [x]    Possible current pregnancy? []  [x]  N/A  Is the patient on methotrexate? []  [x]     Tobacco/Marijuana/Snuff/ETOH use: Former smoker (smoked 1 pack/day for ~32 years; quit 07/1990); Reports occasional alcohol consumption. Denies any smokeless tobacco or recreational drug use   Past/Anticipated interventions by medical oncology, if any:  Consult with Dr. 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    PAST SURGICAL HISTORY: Past Surgical History:  Procedure Laterality Date  . ABDOMINAL AORTIC ENDOVASCULAR FENESTRATED STENT GRAFT N/A 03/21/2018   Procedure: ABDOMINAL AORTIC ENDOVASCULAR FENESTRATED STENT GRAFT ; [place] CT PERFORMED;  Surgeon: Jeffrey [name], MD;  Location: [facility];  Service: Vascular;  Laterality: N/A;  . Basal Skin Cancer  2012  . CATARACT EXTRACTION Bilateral   . MASS BIOPSY Right 09/25/2020   Procedure: OPEN NECK MASS BIOPSY;  Surgeon: Jeffrey Baptist, MD;  Location: [place];  Service: ENT;  Laterality: Right;  . TONSILLECTOMY    . TRANSURETHRAL RESECTION OF PROSTATE      FAMILY HISTORY: family history includes Alcohol abuse in his father; Cancer in his sister; Diabetes in his mother; Heart attack in his father; Heart disease in his father; Hyperlipidemia in his sister; Hypertension in his father, mother, and son; Pancreatic cancer in his brother and sister; Stroke in his mother.  SOCIAL HISTORY:  reports that he quit smoking about 30 years ago. His smoking use included cigarettes. He has a 32.00 pack-year smoking history. He has never used smokeless tobacco. He reports current alcohol use. He reports that he does not use drugs.  ALLERGIES: Ketotifen fumarate, Mixed ragweed, and Triamcinolone  MEDICATIONS:  Current Outpatient Medications  Medication Sig Dispense Refill  . acetaminophen (TYLENOL) 500 MG tablet Take 1,000 mg by mouth every 6 (six) hours as needed for moderate pain or headache.    Marland Kitchen amLODipine (NORVASC) 5 MG tablet Take 1 tablet (5 mg total) by mouth daily. 90 tablet 3  . Ascorbic Acid (VITAMIN C) 1000 MG tablet Take 1,000 mg by mouth daily.     Marland Kitchen atorvastatin (LIPITOR) 40 MG tablet TAKE 1 TABLET(40 MG) BY MOUTH DAILY AT 6 PM 90 tablet 1  . Calcium  Carbonate-Vitamin D 600-200 MG-UNIT TABS Take 1 tablet by mouth every other day.     Marland Kitchen COVID-19 mRNA vaccine, Pfizer, 30 MCG/0.3ML injection INJECT AS DIRECTED .3 mL 0  . Cyanocobalamin 1000 MCG TBCR Take 1,000 mcg by mouth daily.     . Emollient (CERAVE) LOTN Apply 1 application topically daily as needed (after shower care).     . ferrous sulfate 325 (65 FE) MG tablet Take 325 mg by mouth every Wednesday.    . fexofenadine (ALLEGRA) 180 MG tablet Take 180 mg by mouth daily as needed for allergies.     . fluticasone (FLONASE) 50 MCG/ACT nasal spray Place 1 spray into both nostrils every other day.    Marland Kitchen GLUCOSAMINE-CHONDROITIN PO Take 1 tablet by mouth 2 (two) times daily.     Marland Kitchen losartan (COZAAR) 25 MG tablet TAKE 1 TABLET(25 MG) BY MOUTH TWICE DAILY 180 tablet 3  . Multiple Vitamins-Minerals (CENTRUM ADULTS PO) Take 1 tablet by mouth daily.     . Omega-3 1000 MG CAPS Take 1,000-2,000 mg by mouth See admin instructions. Take 1000 mg by mouth in the morning and take 2000 mg by mouth in the evening after supper     No current facility-administered medications  for this encounter.    REVIEW OF SYSTEMS:  Notable for that above.   PHYSICAL EXAM:  There were no vitals filed for this visit.  General: Alert and oriented, in no acute distress HEENT: Head is normocephalic. Extraocular movements are intact. Oropharynx is notable for no lesions in upper throat.  No palpable lesions over the tongue Neck: Neck is notable for no palpable masses, expected thickening at right neck excisional scar Heart: Regular in rate and rhythm with no murmurs, rubs, or gallops. Chest: Clear to auscultation bilaterally, with no rhonchi, wheezes, or rales. Abdomen: Soft, nontender, nondistended, with no rigidity or guarding. Extremities: No cyanosis or edema. Lymphatics: see Neck Exam Skin: No concerning lesions. Musculoskeletal: symmetric strength and muscle tone throughout. Neurologic: Cranial nerves II through XII are  grossly intact. No obvious focalities. Speech is fluent. Coordination is intact. Psychiatric: Judgment and insight are intact. Affect is appropriate.  PROCEDURE NOTE: After obtaining consent and anesthetizing the nasal cavity with topical oxymetazoline, the flexible endoscope was introduced and passed through the nasal cavity.  The nasopharynx, oropharynx, larynx, and hypopharynx were then examined.  There were 2 faint patches of leukoplakia in the right base of tongue that were nonspecific.  Otherwise no lesions appreciated.  True cords are symmetrically mobile without nodularity.   ECOG = 1  0 - Asymptomatic (Fully active, able to carry on all predisease activities without restriction)  1 - Symptomatic but completely ambulatory (Restricted in physically strenuous activity but ambulatory and able to carry out work of a light or sedentary nature. For example, light housework, office work)  2 - Symptomatic, <50% in bed during the day (Ambulatory and capable of all self care but unable to carry out any work activities. Up and about more than 50% of waking hours)  3 - Symptomatic, >50% in bed, but not bedbound (Capable of only limited self-care, confined to bed or chair 50% or more of waking hours)  4 - Bedbound (Completely disabled. Cannot carry on any self-care. Totally confined to bed or chair)  5 - Death   Eustace Pen MM, Creech RH, Tormey DC, et al. [phone]). "Toxicity and response criteria of the [facility]". [place] Oncol. 5 (6): 649-55   LABORATORY DATA:  Lab Results  Component Value Date   WBC 7.1 10/20/2020   HGB 11.2 (L) 10/20/2020   HCT 32.8 (L) 10/20/2020   MCV 95.1 10/20/2020   PLT 165 10/20/2020   CMP     Component Value Date/Time   NA 136 10/20/2020 1414   NA 135 02/03/2020 1134   K 4.3 10/20/2020 1414   CL 102 10/20/2020 1414   CO2 23 10/20/2020 1414   GLUCOSE 102 (H) 10/20/2020 1414   BUN 16 10/20/2020 1414   BUN 10 02/03/2020 1134    CREATININE 1.07 10/20/2020 1414   CREATININE 1.00 05/11/2020 1127   CALCIUM 9.4 10/20/2020 1414   PROT 7.9 10/20/2020 1414   ALBUMIN 4.2 10/20/2020 1414   AST 22 10/20/2020 1414   ALT 15 10/20/2020 1414   ALKPHOS 79 10/20/2020 1414   BILITOT 0.7 10/20/2020 1414   GFRNONAA >60 10/20/2020 1414   GFRAA 75 02/03/2020 1134      Lab Results  Component Value Date   TSH 1.54 04/09/2020     RADIOGRAPHY: NM PET Image Initial (PI) Skull Base To Thigh  Result Date: 10/12/2020 CLINICAL DATA:  Initial treatment strategy for head and neck cancer (squamous cell carcinoma). EXAM: NUCLEAR MEDICINE PET SKULL BASE TO  THIGH TECHNIQUE: 9.94 mCi F-18 FDG was injected intravenously. Full-ring PET imaging was performed from the skull base to thigh after the radiotracer. CT data was obtained and used for attenuation correction and anatomic localization. Fasting blood glucose: 98 mg/dl COMPARISON:  Neck CT 09/11/2020 FINDINGS: Mediastinal blood pool activity: SUV max 2.34 Liver activity: SUV max NA NECK: The patient has had an excisional biopsy of the right level 2 lymph node that was seen on the prior neck CT. Expected postoperative type changes in the surgical bed. No significant residual areas of hypermetabolism in this area. The patient's primary lesion is in the right tongue base with a slightly irregular soft tissue mass on the CT scan correlating with hypermetabolism and SUV max of 7.73. No enlarged or hypermetabolic neck adenopathy. No supraclavicular adenopathy. Incidental CT findings: none CHEST: No enlarged or hypermetabolic supraclavicular or axillary lymph nodes. No mediastinal or hilar lymphadenopathy. No worrisome pulmonary nodules to suggest pulmonary metastatic disease. Incidental CT findings: Atherosclerotic calcifications involving the aorta and coronary arteries. ABDOMEN/PELVIS: No abnormal hypermetabolic activity within the liver, pancreas, adrenal glands, or spleen. No hypermetabolic lymph nodes in  the abdomen or pelvis. Incidental CT findings: Aortoiliac stent graft is noted along with bilateral renal artery stents. No complicating features are identified. Colonic diverticulosis. TURP defect involving the prostate gland. SKELETON: No focal hypermetabolic activity to suggest skeletal metastasis. Incidental CT findings: none IMPRESSION: 1. Primary mucosal neoplasm in the right tongue base area demonstrating hypermetabolism. 2. Status post excisional biopsy of the enlarged right level 2 lymph node. No other enlarged or hypermetabolic neck nodes. 3. No PET-CT findings for metastatic disease involving the chest, abdomen, pelvis or osseous structures. Electronically Signed   By: Marijo Sanes M.D.   On: 10/12/2020 13:40      IMPRESSION/PLAN: Right neck cancer strongly favoring base of tongue primary, p16 positive  This is a delightful patient with head and neck cancer. I recommend radiotherapy for this patient.  Anticipate concurrent chemotherapy.  He does have a consultation with Dr Nicolette Bang to discuss whether he has any surgical options.  However given the degree of extracapsular extension I think that chemoradiation is probably the best solution to cure his cancer.  I will defer to Dr. Nicolette Bang on whether he thinks a biopsy of the area of hypermetabolic activity in the base of tongue is warranted to prove his primary.  Otherwise I will plan to treat the bilateral neck and the area of hypermetabolic disease on his PET when he receives radiation.  We discussed the potential risks, benefits, and side effects of radiotherapy. We talked in detail about acute and late effects. We discussed that some of the most bothersome acute effects may be mucositis, dysgeusia, salivary changes, skin irritation, hair loss, dehydration, weight loss and fatigue. We talked about late effects which include but are not necessarily limited to dysphagia, hypothyroidism, nerve injury, vascular injury, spinal cord injury, xerostomia,  trismus, neck edema, and potential injury to any of the tissues in the head and neck region. No guarantees of treatment were given. A consent form was signed and placed in the patient's medical record. The patient is enthusiastic about proceeding with treatment. I look forward to participating in the patient's care.    Simulation (treatment planning) will take place upon release from dentistry and otolaryngology  We also discussed that the treatment of head and neck cancer is a multidisciplinary process to maximize treatment outcomes and quality of life. For this reason the following referrals have been or will  be made:   Medical oncology to discuss chemotherapy    Dentistry for dental evaluation, possible extractions in the radiation fields, and /or advice on reducing risk of cavities, osteoradionecrosis, or other oral issues.   Nutritionist for nutrition support during and after treatment.   Speech language pathology for swallowing and/or speech therapy.   Social work for social support.    Physical therapy due to risk of lymphedema in neck and deconditioning.   Baseline labs including TSH.  On date of service, in total, I spent 60 minutes on this encounter. Patient was seen in person.  __________________________________________   Eppie Gibson, MD  This document serves as a record of services personally performed by Eppie Gibson, MD. It was created on his behalf by Clerance Lav, a trained medical scribe. The creation of this record is based on the scribe's personal observations and the provider's statements to them. This document has been checked and approved by the attending provider.

## 2020-10-20 NOTE — Progress Notes (Signed)
Head and Neck Cancer Location of Tumor / Histology:  Squamous Cell Carcinoma of RIGHT Neck, p16(+)  Patient presented with symptoms of: a right neck mass that he noticed ~2-3 months ago. On 09/10/2020 he underwent a neck ultrasound and CT which showed a 2.7 cm right level II lymph node. Asymmetric soft tissue was also noted at the right tongue base. --PET Scan 10/12/2020 IMPRESSION: 1. Primary mucosal neoplasm in the right tongue base area demonstrating hypermetabolism. 2. Status post excisional biopsy of the enlarged right level 2 lymph node. No other enlarged or hypermetabolic neck nodes. 3. No PET-CT findings for metastatic disease involving the chest, abdomen, pelvis or osseous structures --CT Scan w/ Contrast 09/11/2020 IMPRESSION: 1. Enlarged right level 2 node.  Tissue sampling recommended. 2. Asymmetric soft tissue at the right tongue base. Favored to reflect asymmetric tonsillar tissue. Given above, direct visual inspection is recommended  Biopsies revealed:  09/25/2020 FINAL MICROSCOPIC DIAGNOSIS:  A. SOFT TISSUE MASS, RIGHT NECK, EXCISION:  - Poorly differentiated squamous cell carcinoma.  - See comment.  COMMENT:  There is focally a small amount of lymphoid tissue at the periphery and the findings are consistent with metastatic poorly differentiated carcinoma which has mostly replaced the lymph node. ADDENDUM: The nodule is mostly squamous cell carcinoma with a small amount of lymphoid tissue at the periphery, presumably residual lymph node. As the tumor has mostly replaced the presumed lymph node, there is almostly certainly extra-nodal extension, the extent of which cannot be accurately measured due to the marked paucity of possible residual lymph node. ADDENDUM:  Immunohistochemistry for p16 shows the tumor is diffusely and strongly positive.  Nutrition Status Yes No Comments  Weight changes? []  [x]    Swallowing concerns? []  [x]    PEG? []  [x]     Referrals Yes No  Comments  Social Work? [x]  []    Dentistry? [x]  []  Sees Dr. Chevis Pretty at [facility]. regularly  Swallowing therapy? [x]  []    Nutrition? [x]  []    Med/Onc? [x]  []  Dr. Arletha Pili [place]   Safety Issues Yes No Comments  Prior radiation? []  [x]    Pacemaker/ICD? []  [x]    Possible current pregnancy? []  [x]  N/A  Is the patient on methotrexate? []  [x]     Tobacco/Marijuana/Snuff/ETOH use: Former smoker (smoked 1 pack/day for ~32 years; quit 07/1990); Reports occasional alcohol consumption. Denies any smokeless tobacco or recreational drug use  Past/Anticipated interventions by otolaryngology, if any:  09/25/2020 Dr. Leta Baptist Excision of deep right neck lymph nodes  Past/Anticipated interventions by medical oncology, if any:  Consult with Dr. Arletha Pili I[place] this morning   Current Complaints / other details:   Patient has received first 3 doses of Pfizer vaccine, and is scheduled for booster this coming Monday 10/26/20

## 2020-10-20 NOTE — Progress Notes (Signed)
[facility] NOTE  Patient Care Team: Vivi Barrack, MD as PCP - General (Family Medicine) Buford Dresser, MD as PCP - Cardiology (Cardiology) Chevis Pretty as Consulting Physician (Dentistry) [name], MD as Consulting Physician (Vascular Surgery) [name], [name], PA-C as Physician Assistant (Dermatology) Malmfelt, Stephani Police, RN as Oncology Nurse Navigator Eppie Gibson, MD as Consulting Physician (Radiation Oncology) Leta Baptist, MD as Consulting Physician (Otolaryngology)  CHIEF COMPLAINTS/PURPOSE OF CONSULTATION:  Oropharyngeal cancer.  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 He will be seeing Dr Nicolette Bang at [facility] for TORS consult. He had excisional biopsy which SCC with a small amount of lymphoid tissue at the periphery, presumably residual lymph node. As the tumor has mostly replaced the presumed lymph node, there is concern that there is definitive presence of extra nodal extension, extent of which cannot be accurately measured due to marked paucity of possible residual lymph node. Given this information, we have discussed that although he could TORS he may still need adjuvant CRT vs CRT alone. He will discuss this with Dr Nicolette Bang We have discussed about weekly cisplatin, adverse effects including but not limited to fatigue, nausea, vomiting, cytopenia, ototoxicity, nephrotoxicity, neuropathy. He is a good candidate for weekly cisplatin. We will review recommendations from Dr Nicolette Bang and decide accordingly. I have reviewed his images with him today.   Orders Placed This Encounter  Procedures  . CBC with Differential (Cancer Center Only)    Standing Status:   Future    Number of Occurrences:   1    Standing Expiration Date:   10/20/2021  . CMP ([facility] only)     Standing Status:   Future    Number of Occurrences:   1    Standing Expiration Date:   10/20/2021    HISTORY OF PRESENTING ILLNESS:   Jeffrey [name] 80 y.o. male is here because of new diagnosis of oropharyngeal cancer.  Chronology  Patient first noticed to have neck mass about 2 months prior to presentation. He denies any complaints except for neck mass, no difficulty swallowing, pain at the base of the tongue. He went to see his PCP who recommended an [place] which demonstrated hypoechoic mass at the right level II a highly concerning for an enlarged pathologic LN. He was referred to ENT and CT neck was ordered. CT neck showed enlarged right level 2 node, asymmetric soft tissue at the right tongue base, favored to reflect asymmetric tonsillar issue. 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 PET CT showed primary mucosal neoplasm in the right tongue base area demonstrating hypermetabolism. No PET findings concerning for metastatic disease. He is here for initial consultation with medical oncology and radiation oncology. He is healthy at baseline, he walks half a mile to mile a day. He is independent with ADL's Previously he used to walk 5 miles a day but since his back pain and aneursym surgery a few yrs ago, he is limited by his back pain. Rest of the pertinent 10 point ROS reviewed and negative.  REVIEW OF SYSTEMS:    Constitutional: Denies fevers, chills or abnormal night sweats Eyes: Denies blurriness of vision, double vision or watery eyes Ears, nose, mouth, throat, and face: Denies mucositis or sore throat Respiratory: Denies cough, dyspnea or wheezes  Cardiovascular: Denies palpitation, chest discomfort or lower extremity swelling Gastrointestinal:  Denies nausea, heartburn or change in bowel habits Skin: Denies abnormal skin rashes Lymphatics: Denies new lymphadenopathy or easy  bruising Neurological:Denies numbness, tingling or new weaknesses Behavioral/Psych: Mood is stable, no new changes  All other systems were reviewed with the patient and are negative.  MEDICAL HISTORY:  Past Medical History:  Diagnosis Date  . AAA (abdominal aortic aneurysm) ([place])   . Allergy   . Arthritis   . Colon polyps   . Coronary artery calcification   . Eczematous dermatitis   . GERD (gastroesophageal reflux disease)   . Hypertension   . Iliac artery stenosis, left ([place])   . 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 Osteopenia     SURGICAL HISTORY: Past Surgical History:  Procedure Laterality Date  . ABDOMINAL AORTIC ENDOVASCULAR FENESTRATED STENT GRAFT N/A 03/21/2018   Procedure: ABDOMINAL AORTIC ENDOVASCULAR FENESTRATED STENT GRAFT ; [place] CT PERFORMED;  Surgeon: [name], MD;  Location: [facility];  Service: Vascular;  Laterality: N/A;  . Basal Skin Cancer  2012  . CATARACT EXTRACTION Bilateral   . MASS BIOPSY Right 09/25/2020   Procedure: OPEN NECK MASS BIOPSY;  Surgeon: Leta Baptist, MD;  Location: [facility];  Service: ENT;  Laterality: Right;  . TONSILLECTOMY    . TRANSURETHRAL RESECTION OF PROSTATE      SOCIAL HISTORY: Social History   Socioeconomic History  . Marital status: Divorced    Spouse name: Not on file  . Number of children: 1  . Years of education: Not on file  . Highest education level: Not on file  Occupational History  . Occupation: Retired    Fish farm manager: [employer]  Tobacco Use  . Smoking status: Former Smoker    Packs/day: 1.00    Years: 32.00    Pack years: 32.00    Types: Cigarettes    Quit date: 08/17/1990    Years since quitting: 30.2  . Smokeless tobacco: Never Used  Vaping Use  . Vaping Use: Never used  Substance and Sexual Activity  . Alcohol use: Yes    Comment: Occassionally, "when I feel like it"  . Drug use: No  . Sexual activity: Yes    Partners: Female  Other Topics  Concern  . Not on file  Social History Narrative  . Not on file   Social Determinants of Health   Financial Resource Strain: Low Risk   . Difficulty of Paying Living Expenses: Not hard at all  Food Insecurity: No Food Insecurity  . Worried About Charity fundraiser in the Last Year: Never true  . Ran Out of Food in the Last Year: Never true  Transportation Needs: No Transportation Needs  . Lack of Transportation (Medical): No  . Lack of Transportation (Non-Medical): No  Physical Activity: Insufficiently Active  . Days of Exercise per Week: 5 days  . Minutes of Exercise per Session: 20 min  Stress: No Stress Concern Present  . Feeling of Stress : Not at all  Social Connections: Moderately Isolated  . Frequency of Communication with Friends and Family: Once a week  . Frequency of Social Gatherings with Friends and Family: Twice a week  . Attends Religious Services: Never  . Active Member of Clubs or Organizations: Yes  . Attends Archivist Meetings: 1 to 4 times per year  . Marital Status: Divorced  Human resources officer Violence: Not At Risk  .  Fear of Current or Ex-Partner: No  . Emotionally Abused: No  . Physically Abused: No  . Sexually Abused: No    FAMILY HISTORY: Family History  Problem Relation Age of Onset  . Diabetes Mother   . Hypertension Mother   . Stroke Mother   . Alcohol abuse Father   . Heart attack Father   . Heart disease Father   . Hypertension Father   . Cancer Sister   . Hyperlipidemia Sister   . Pancreatic cancer Sister   . Pancreatic cancer Brother   . Hypertension Son     ALLERGIES:  is allergic to ketotifen fumarate, mixed ragweed, and triamcinolone.  MEDICATIONS:  Current Outpatient Medications  Medication Sig Dispense Refill  . acetaminophen (TYLENOL) 500 MG tablet Take 1,000 mg by mouth every 6 (six) hours as needed for moderate pain or headache.    Marland Kitchen amLODipine (NORVASC) 5 MG tablet Take 1 tablet (5 mg total) by mouth daily. 90  tablet 3  . Ascorbic Acid (VITAMIN C) 1000 MG tablet Take 1,000 mg by mouth daily.     Marland Kitchen atorvastatin (LIPITOR) 40 MG tablet TAKE 1 TABLET(40 MG) BY MOUTH DAILY AT 6 PM 90 tablet 1  . Calcium Carbonate-Vitamin D 600-200 MG-UNIT TABS Take 1 tablet by mouth every other day.     Marland Kitchen COVID-19 mRNA vaccine, Pfizer, 30 MCG/0.3ML injection INJECT AS DIRECTED .3 mL 0  . Cyanocobalamin 1000 MCG TBCR Take 1,000 mcg by mouth daily.     . Emollient (CERAVE) LOTN Apply 1 application topically daily as needed (after shower care).     . ferrous sulfate 325 (65 FE) MG tablet Take 325 mg by mouth every Wednesday.    . fexofenadine (ALLEGRA) 180 MG tablet Take 180 mg by mouth daily as needed for allergies.     . fluticasone (FLONASE) 50 MCG/ACT nasal spray Place 1 spray into both nostrils every other day.    Marland Kitchen GLUCOSAMINE-CHONDROITIN PO Take 1 tablet by mouth 2 (two) times daily.     Marland Kitchen losartan (COZAAR) 25 MG tablet TAKE 1 TABLET(25 MG) BY MOUTH TWICE DAILY 180 tablet 3  . Multiple Vitamins-Minerals (CENTRUM ADULTS PO) Take 1 tablet by mouth daily.     . Omega-3 1000 MG CAPS Take 1,000-2,000 mg by mouth See admin instructions. Take 1000 mg by mouth in the morning and take 2000 mg by mouth in the evening after supper     No current facility-administered medications for this visit.    PHYSICAL EXAMINATION: ECOG PERFORMANCE STATUS: 0 - Asymptomatic  Vitals:   10/20/20 1114  BP: (!) 169/79  Pulse: 69  Resp: 18  Temp: 97.9 F (36.6 C)  SpO2: 99%   Filed Weights   10/20/20 1114  Weight: 203 lb 11.2 oz (92.4 kg)    GENERAL:alert, no distress and comfortable SKIN: skin color, texture, turgor are normal, no rashes or significant lesions EYES: normal, conjunctiva are pink and non-injected, sclera clear OROPHARYNX:no exudate, no erythema and lips, buccal mucosa, and tongue normal  NECK: palpable surgical area on the right neck. No other lymphadenopathy LYMPH:  no palpable lymphadenopathy in the cervical,  axillary or inguinal LUNGS: clear to auscultation and percussion with normal breathing effort HEART: regular rate & rhythm and no murmurs and no lower extremity edema ABDOMEN:abdomen soft, non-tender and normal bowel sounds Musculoskeletal:no cyanosis of digits and no clubbing  PSYCH: alert & oriented x 3 with fluent speech NEURO: no focal motor/sensory deficits  LABORATORY DATA:  I have reviewed  the data as listed Lab Results  Component Value Date   WBC 7.1 10/20/2020   HGB 11.2 (L) 10/20/2020   HCT 32.8 (L) 10/20/2020   MCV 95.1 10/20/2020   PLT 165 10/20/2020     Chemistry      Component Value Date/Time   NA 136 10/20/2020 1414   NA 135 02/03/2020 1134   K 4.3 10/20/2020 1414   CL 102 10/20/2020 1414   CO2 23 10/20/2020 1414   BUN 16 10/20/2020 1414   BUN 10 02/03/2020 1134   CREATININE 1.07 10/20/2020 1414   CREATININE 1.00 05/11/2020 1127      Component Value Date/Time   CALCIUM 9.4 10/20/2020 1414   ALKPHOS 79 10/20/2020 1414   AST 22 10/20/2020 1414   ALT 15 10/20/2020 1414   BILITOT 0.7 10/20/2020 1414      RADIOGRAPHIC STUDIES: I have personally reviewed the radiological images as listed and agreed with the findings in the report. NM PET Image Initial (PI) Skull Base To Thigh  Result Date: 10/12/2020 CLINICAL DATA:  Initial treatment strategy for head and neck cancer (squamous cell carcinoma). EXAM: NUCLEAR MEDICINE PET SKULL BASE TO THIGH TECHNIQUE: 9.94 mCi F-18 FDG was injected intravenously. Full-ring PET imaging was performed from the skull base to thigh after the radiotracer. CT data was obtained and used for attenuation correction and anatomic localization. Fasting blood glucose: 98 mg/dl COMPARISON:  Neck CT 09/11/2020 FINDINGS: Mediastinal blood pool activity: SUV max 2.34 Liver activity: SUV max NA NECK: The patient has had an excisional biopsy of the right level 2 lymph node that was seen on the prior neck CT. Expected postoperative type changes in the  surgical bed. No significant residual areas of hypermetabolism in this area. The patient's primary lesion is in the right tongue base with a slightly irregular soft tissue mass on the CT scan correlating with hypermetabolism and SUV max of 7.73. No enlarged or hypermetabolic neck adenopathy. No supraclavicular adenopathy. Incidental CT findings: none CHEST: No enlarged or hypermetabolic supraclavicular or axillary lymph nodes. No mediastinal or hilar lymphadenopathy. No worrisome pulmonary nodules to suggest pulmonary metastatic disease. Incidental CT findings: Atherosclerotic calcifications involving the aorta and coronary arteries. ABDOMEN/PELVIS: No abnormal hypermetabolic activity within the liver, pancreas, adrenal glands, or spleen. No hypermetabolic lymph nodes in the abdomen or pelvis. Incidental CT findings: Aortoiliac stent graft is noted along with bilateral renal artery stents. No complicating features are identified. Colonic diverticulosis. TURP defect involving the prostate gland. SKELETON: No focal hypermetabolic activity to suggest skeletal metastasis. Incidental CT findings: none IMPRESSION: 1. Primary mucosal neoplasm in the right tongue base area demonstrating hypermetabolism. 2. Status post excisional biopsy of the enlarged right level 2 lymph node. No other enlarged or hypermetabolic neck nodes. 3. No PET-CT findings for metastatic disease involving the chest, abdomen, pelvis or osseous structures. Electronically Signed   By: Marijo Sanes M.D.   On: 10/12/2020 13:40   I have reviewed images independently, reviewed pathology reports.  All questions were answered. The patient knows to call the clinic with any problems, questions or concerns. I spent 45 minutes in the care of this patient including H and P, review of records, counseling and coordination of care.     Benay Pike, MD 10/21/2020 9:34 AM

## 2020-10-21 ENCOUNTER — Encounter: Payer: Self-pay | Admitting: Hematology and Oncology

## 2020-10-21 ENCOUNTER — Encounter: Payer: Self-pay | Admitting: Radiation Oncology

## 2020-10-21 DIAGNOSIS — C01 Malignant neoplasm of base of tongue: Secondary | ICD-10-CM | POA: Insufficient documentation

## 2020-10-21 DIAGNOSIS — C109 Malignant neoplasm of oropharynx, unspecified: Secondary | ICD-10-CM | POA: Insufficient documentation

## 2020-10-21 NOTE — Assessment & Plan Note (Signed)
This is a very pleasant 80 yr old male patient with TxN1M0 HPV positive SCC oropharynx referred to medical oncology for further recommendations. He will be seeing Dr Nicolette Bang at [facility] for TORS consult. He had excisional biopsy which SCC with a small amount of lymphoid tissue at the periphery, presumably residual lymph node. As the tumor has mostly replaced the presumed lymph node, there is concern that there is definitive presence of extra nodal extension, extent of which cannot be accurately measured due to marked paucity of possible residual lymph node. Given this information, we have discussed that although he could TORS he may still need adjuvant CRT vs CRT alone. He will discuss this with Dr Nicolette Bang We have discussed about weekly cisplatin, adverse effects including but not limited to fatigue, nausea, vomiting, cytopenia, ototoxicity, nephrotoxicity, neuropathy. He is a good candidate for weekly cisplatin. We will review recommendations from Dr Nicolette Bang and decide accordingly. I have reviewed his images with him today.

## 2020-10-22 DIAGNOSIS — C760 Malignant neoplasm of head, face and neck: Secondary | ICD-10-CM | POA: Diagnosis not present

## 2020-10-26 ENCOUNTER — Ambulatory Visit: Payer: Medicare Other | Attending: Internal Medicine

## 2020-10-26 DIAGNOSIS — Z23 Encounter for immunization: Secondary | ICD-10-CM

## 2020-10-26 NOTE — Progress Notes (Signed)
   Covid-19 Vaccination Clinic  Name:  Rob Mciver    MRN: [id number] DOB: 07/25/1940  10/26/2020  Mr. Crysler was observed post Covid-19 immunization for 15 minutes without incident. He was provided with Vaccine Information Sheet and instruction to access the V-Safe system.   Mr. Dirocco was instructed to call 911 with any severe reactions post vaccine: Marland Kitchen Difficulty breathing  . Swelling of face and throat  . A fast heartbeat  . A bad rash all over body  . Dizziness and weakness   Immunizations Administered    Name Date Dose VIS Date Route   PFIZER Comrnaty(Gray TOP) Covid-19 Vaccine 10/26/2020 11:53 AM 0.3 mL -- Intramuscular   Manufacturer: [place]   Lot: [id number]   [place]: [phone]

## 2020-10-27 ENCOUNTER — Other Ambulatory Visit: Payer: Self-pay

## 2020-10-27 ENCOUNTER — Ambulatory Visit (INDEPENDENT_AMBULATORY_CARE_PROVIDER_SITE_OTHER): Payer: Self-pay | Admitting: Dentistry

## 2020-10-27 VITALS — BP 186/87 | HR 61 | Temp 98.5°F

## 2020-10-27 DIAGNOSIS — C109 Malignant neoplasm of oropharynx, unspecified: Secondary | ICD-10-CM

## 2020-10-27 DIAGNOSIS — K0889 Other specified disorders of teeth and supporting structures: Secondary | ICD-10-CM

## 2020-10-27 DIAGNOSIS — K030 Excessive attrition of teeth: Secondary | ICD-10-CM

## 2020-10-27 DIAGNOSIS — K032 Erosion of teeth: Secondary | ICD-10-CM

## 2020-10-27 DIAGNOSIS — K0530 Chronic periodontitis, unspecified: Secondary | ICD-10-CM

## 2020-10-27 DIAGNOSIS — Z01818 Encounter for other preprocedural examination: Secondary | ICD-10-CM

## 2020-10-27 DIAGNOSIS — C01 Malignant neoplasm of base of tongue: Secondary | ICD-10-CM

## 2020-10-27 DIAGNOSIS — K031 Abrasion of teeth: Secondary | ICD-10-CM

## 2020-10-27 DIAGNOSIS — M264 Malocclusion, unspecified: Secondary | ICD-10-CM

## 2020-10-27 DIAGNOSIS — K045 Chronic apical periodontitis: Secondary | ICD-10-CM

## 2020-10-27 DIAGNOSIS — K08109 Complete loss of teeth, unspecified cause, unspecified class: Secondary | ICD-10-CM

## 2020-10-27 DIAGNOSIS — K06020 Generalized gingival recession, unspecified: Secondary | ICD-10-CM

## 2020-10-27 NOTE — Patient Instructions (Signed)
[facility] Department of Dental Medicine Claris Guymon B. Clotine Heiner, D.M.D. Phone: [phone] Fax: [phone]   It was a pleasure seeing you today!  Please refer to the information below regarding your dental visit with us, and call us should you have any questions or concerns that may come up after you leave.   Thank you for giving us the opportunity to provide care for you.  If there is anything we can do for you, please let us know.    RADIATION THERAPY AND INFORMATION REGARDING YOUR TEETH   . XEROSTOMIA (DRY MOUTH):  Your salivary glands may be in the field of radiation.  Radiation may include all or only part of your salivary glands.  This will cause your saliva to dry up, and you will have a dry mouth.  The dry mouth will be for the rest of your life unless your radiation oncologist tells you otherwise.  Your saliva has many functions: 1. It wets your tongue for speaking. 2. It coats your teeth and the inside of your mouth for easier movement. 3. It helps with chewing and swallowing food. 4. It helps clean away harmful acid and toxic products made by the germs in your mouth, therefore it helps prevent cavities. 5. It kills some germs in your mouth and helps to prevent gum disease. 6. It helps to carry flavor to your taste buds.  Once you have lost your saliva, you will be at higher risk for tooth decay and gum disease.    What can be done to help improve your mouth when there's not enough saliva: >> Your dentist may give a recommendation for CLoSYS.  It will not bring back all of your saliva but may bring back some of it.  Also, your saliva may be thick and ropy or white and foamy.  It will not feel like it use to feel. >> You will need to swish with water every time your mouth feels dry.  YOU CANNOT suck on any cough drops, mints, lemon drops, candy, vitamin C or any other products.  You cannot use anything other than water to make your mouth feel less dry.  If you want to drink  anything else, you have to drink it all at once and brush afterwards.  Be sure to discuss the details of your diet habits with your dentist or hygienist.   . RADIATION CARIES:  This is decay (cavities) that happens very quickly once your mouth is very dry due to radiation therapy.  Normally, cavities take six months to two years to become a problem.  When you have dry mouth, cavities may take as little as eight weeks to cause you a problem.   >>  Dental check-ups every two months are necessary as long as you have a dry mouth. Radiation caries typically, but not always, start at your gum line where it is hard to see the cavity.  It is therefore also hard to fill these cavities adequately.  This high rate of cavities happens because your mouth no longer has saliva and therefore the acid made by the germs starts the decay process.  Whenever you eat anything the germs in your mouth change the food into acid.  The acid then burns a small hole in your tooth.  This small hole is the beginning of a cavity.  If this is not treated then it will grow bigger and become a cavity.  The way to avoid this hole getting bigger is to use fluoride every   evening as prescribed by your dentist following your radiation.   NOTE:  You have to make sure that your teeth are very clean before you use the fluoride.  This fluoride in turn will strengthen your teeth and prepare them for another day of fighting acid. >> If you develop radiation caries many times, the damage is so large that you will have to have all your teeth removed.  This could be a big problem if some of these teeth are in the field of radiation.  Further details of why this could be a big problem will follow (see Osteoradionecrosis below).   . DYSGEUSIA (LOSS OF TASTE):  This happens to varying degrees once you've had radiation therapy to your jaw region.  Many times taste is not completely lost, but becomes limited.  The loss of taste is mostly due to radiation  affecting your taste buds.  However, if you have no saliva in your mouth to carry the flavor to your taste buds, it would be difficult for your taste buds to taste anything.  That is why using water or a prescription for [place] prior to meals and during meal times may help with some of the taste.  Keep in mind that taste generally returns very slowly over the course of several months or several years after radiation therapy.  Don't give up hope.   . TRISMUS (LIMITED JAW OPENING):  According to your radiation oncologist, your TMJ or jaw joints are going to be partially or fully in the field of radiation.  This means that over time the muscles that help you open and close your mouth may get stiff.  This will potentially result in your not being able to open your mouth wide enough or as wide as you can open it now.    Let me give you an example of how slowly this happens and how unaware people are of it:   >>  A gentlemen that had radiation therapy two years ago came back to me complaining that bananas are just too large for him to be able to fit them in between his teeth.  He was not able to open wide enough to bite into a banana.  This happens slowly and over a period of time.  What we do to try and prevent this:   1. Your dentist will probably give you a stack of sticks called a trismus exercise device.  This stack will help remind your muscles and your jaw joints to open up to the same distance every day.  Use these sticks every morning when you wake up, or according to the instructions given by your dentist.    2. You must use these sticks for at least one to two years after radiation therapy.  The reason for that is because it happens so slowly and keeps going on for about two years after radiation therapy.  Your hospital dentist will help you monitor your mouth opening and make sure that it's not getting smaller after radiation.  Trismus Exercises: >>  Using the stack of sticks given to you by your  dentist, place the stack in your mouth and hold onto the other end for support. >>  Leave the sticks in your mouth while holding the other end.  Allow 30 seconds for muscle stretching. >>  Rest for a few seconds. >>  Repeat 3-5 times. >>  This exercise is recommended in the mornings and evenings unless otherwise instructed. >>  The exercise should be   done for a period of 2 YEARS after the end of radiation. >>  Your maximum jaw opening should be checked routinely at recall dental visits by your general dentist. >>  You should report any changes, soreness, or difficulties encountered when doing the exercises to your dentist.   . OSTEORADIONECROSIS (ORN):  This is a condition where your jaw bone after radiation therapy becomes very dry.  It has very little blood supply to keep it alive.  If you develop a cavity that turns into an abscess or an infection, then the jaw bone does not have enough blood supply to help fight the infection.  At this point it is very likely that the infection could cause the death of your jaw bone.  When you have dead bone it has to be removed.  Therefore, you might end up having to have surgery to remove part of your jaw bone, the part of the jaw bone that has been affected.     >>  Healing is also a problem if you are to have surgery (like a tooth extraction) in the areas where the bone has had radiation therapy.  If you have surgery, you need more blood supply to heal which is not available.  When blood supply and oxygen are not available, there is a chance for the bone to die. >>  Occasionally, ORN happens on its own with no obvious reason, but this is quite rare.  We believe that patients who continue to smoke and/or drink alcohol have a higher chance of having this problem. >>  Once your jaw bone has had radiation therapy, if there are any remaining teeth in that area, it is not recommended to have them pulled unless your dentist or oral surgeon is aware of your history of  radiation and believes it is safe.  >>  The risks for ORN either from infection or spontaneously occurring (with no reason) are life long.   QUESTIONS?  Call our office during office hours [phone].    

## 2020-10-27 NOTE — Progress Notes (Signed)
Department of Dental Medicine     OUTPATIENT CONSULTATION  Service Date:   10/27/2020  Patient Name:  Jeffrey [name] Date of Birth:   04/03/1941 Medical Record Number: [id number]  Referring Provider:              Eppie Gibson, MD   PLAN & RECOMMENDATIONS   > There are no current signs of acute dental infection including abscess, edema or erythema, or suspicious lesion requiring biopsy.  There are 2 teeth in the upper left quadrant that are periodontally hopeless and chronically infected, however they are not in the field of radiation and are not currently symptomatic or showing signs of acute infection. >> There are no current recommendations for dental intervention prior to the patient starting radiation therapy.  Recommend the patient return to our clinic following completion of therapy and then to his regular dentist for extractions of indicated teeth and routine cleanings and exams to decrease the risk of postoperative infection and/or other complications. >>> Plan to discuss with medical team and coordinate treatment as needed.  Impressions were taken for fabrication of upper and lower scatter protection devices and plan to schedule the patient for delivery once CT sim is scheduled.  >>  Discussed in detail all treatment options with the patient and they are agreeable to the plan.   Thank you for consulting with [facility] and for the opportunity to participate in this patient's treatment.  Should you have any questions or concerns, please contact the [facility] at [phone].  10/27/2020      CONSULT NOTE   COVID 19 SCREENING: The patient denies symptoms concerning for COVID-19 infection including fever, chills, cough, or newly developed shortness of breath.   HISTORY OF PRESENT ILLNESS: >> Jeffrey [name] is a very pleasant 80 y.o. 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  He is s/p surgical intervention for neck mass on 09/25/20.  The patient presents today for a medically necessary dental consultation as part of their pre-radiation work-up.  DENTAL HISTORY: > The patient reports that he does have a dentist he sees regularly.  He last went a couple of months ago for a check-up and cleaning/periodontal maintenance visit.  He reports that he knows he has a tooth in the upper left quadrant that will eventually need to be extracted, but it is currently not bothering him.  He currently denies any dental/orofacial pain or sensitivity. >> Patient is able to manage oral secretions.  Patient denies dysphagia, odynophagia, dysphonia, SOB and neck pain.  Patient denies fever, rigors and malaise.   CHIEF COMPLAINT:  Here for a medically-necessary pre-radiation dental consultation.   Patient Active Problem List   Diagnosis Date Noted  . 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 Malignant neoplasm of base of tongue ([place]) 10/21/2020  . Hyperglycemia 04/09/2020  . Chronic neck pain 04/09/2020  . Coronary artery disease due to calcified coronary lesion 12/27/2018  . Osteopenia 02/24/2017  . Abdominal aortic aneurysm (AAA) without rupture ([place]) 07/06/2015  . Basal cell carcinoma 07/06/2015  . Essential hypertension 07/06/2015  . History of colon polyps 07/06/2015  . HLD (hyperlipidemia) 07/06/2015   Past Medical History:  Diagnosis Date  . AAA (abdominal aortic aneurysm) (  [place])   . Allergy   . Arthritis   . Colon polyps   . Coronary artery calcification   . Eczematous dermatitis   . GERD (gastroesophageal reflux disease)   . Hypertension   . Iliac artery  stenosis, left ([place])   . 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 Osteopenia    Past Surgical History:  Procedure Laterality Date  . ABDOMINAL AORTIC ENDOVASCULAR FENESTRATED STENT GRAFT N/A 03/21/2018   Procedure: ABDOMINAL AORTIC ENDOVASCULAR FENESTRATED STENT GRAFT ; [facility] PERFORMED;  Surgeon: [name], MD;  Location: [facility];  Service: Vascular;  Laterality: N/A;  . Basal Skin Cancer  2012  . CATARACT EXTRACTION Bilateral   . MASS BIOPSY Right 09/25/2020   Procedure: OPEN NECK MASS BIOPSY;  Surgeon: Leta Baptist, MD;  Location: [facility];  Service: ENT;  Laterality: Right;  . TONSILLECTOMY    . TRANSURETHRAL RESECTION OF PROSTATE     Allergies  Allergen Reactions  . Ketotifen Fumarate Swelling and Other (See Comments)    Eye swelling  . Mixed Ragweed     Hay fever  . Triamcinolone Hives and Rash   Current Outpatient Medications  Medication Sig Dispense Refill  . acetaminophen (TYLENOL) 500 MG tablet Take 1,000 mg by mouth every 6 (six) hours as needed for moderate pain or headache.    Marland Kitchen amLODipine (NORVASC) 5 MG tablet Take 1 tablet (5 mg total) by mouth daily. 90 tablet 3  . Ascorbic Acid (VITAMIN C) 1000 MG tablet Take 1,000 mg by mouth daily.     Marland Kitchen atorvastatin (LIPITOR) 40 MG tablet TAKE 1 TABLET(40 MG) BY MOUTH DAILY AT 6 PM 90 tablet 1  . Calcium Carbonate-Vitamin D 600-200 MG-UNIT TABS Take 1 tablet by mouth every other day.     Marland Kitchen COVID-19 mRNA vaccine, Pfizer, 30 MCG/0.3ML injection INJECT AS DIRECTED .3 mL 0  . Cyanocobalamin 1000 MCG TBCR Take 1,000 mcg by mouth daily.     . Emollient (CERAVE) LOTN Apply 1 application topically daily as needed (after shower care).     . ferrous sulfate 325 (65 FE) MG tablet Take 325 mg by mouth every Wednesday.    . fexofenadine (ALLEGRA) 180 MG tablet Take 180 mg by mouth daily as needed for allergies.     . fluticasone (FLONASE) 50 MCG/ACT nasal spray Place 1 spray into both  nostrils every other day.    Marland Kitchen GLUCOSAMINE-CHONDROITIN PO Take 1 tablet by mouth 2 (two) times daily.     Marland Kitchen losartan (COZAAR) 25 MG tablet TAKE 1 TABLET(25 MG) BY MOUTH TWICE DAILY 180 tablet 3  . Multiple Vitamins-Minerals (CENTRUM ADULTS PO) Take 1 tablet by mouth daily.     . Omega-3 1000 MG CAPS Take 1,000-2,000 mg by mouth See admin instructions. Take 1000 mg by mouth in the morning and take 2000 mg by mouth in the evening after supper     No current facility-administered medications for this visit.    LABS: Lab Results  Component Value Date   WBC 7.1 10/20/2020   HGB 11.2 (L) 10/20/2020   HCT 32.8 (L) 10/20/2020   MCV 95.1 10/20/2020   PLT 165 10/20/2020      Component Value Date/Time   NA 136 10/20/2020 1414   NA 135 02/03/2020 1134   K 4.3 10/20/2020 1414   CL 102 10/20/2020 1414   CO2 23 10/20/2020 1414   GLUCOSE 102 (H) 10/20/2020 1414   BUN 16  10/20/2020 1414   BUN 10 02/03/2020 1134   CREATININE 1.07 10/20/2020 1414   CREATININE 1.00 05/11/2020 1127   CALCIUM 9.4 10/20/2020 1414   GFRNONAA >60 10/20/2020 1414   GFRAA 75 02/03/2020 1134   Lab Results  Component Value Date   INR 1.04 03/13/2018   No results found for: PTT  Social History   Socioeconomic History  . Marital status: Divorced    Spouse name: Not on file  . Number of children: 1  . Years of education: Not on file  . Highest education level: Not on file  Occupational History  . Occupation: Retired    Fish farm manager: [employer]  Tobacco Use  . Smoking status: Former Smoker    Packs/day: 1.00    Years: 32.00    Pack years: 32.00    Types: Cigarettes    Quit date: 08/17/1990    Years since quitting: 30.2  . Smokeless tobacco: Never Used  Vaping Use  . Vaping Use: Never used  Substance and Sexual Activity  . Alcohol use: Yes    Comment: Occassionally, "when I feel like it"  . Drug use: No  . Sexual activity: Yes    Partners: Female  Other Topics Concern  . Not on file  Social  History Narrative  . Not on file   Social Determinants of Health   Financial Resource Strain: Low Risk   . Difficulty of Paying Living Expenses: Not hard at all  Food Insecurity: No Food Insecurity  . Worried About Charity fundraiser in the Last Year: Never true  . Ran Out of Food in the Last Year: Never true  Transportation Needs: No Transportation Needs  . Lack of Transportation (Medical): No  . Lack of Transportation (Non-Medical): No  Physical Activity: Insufficiently Active  . Days of Exercise per Week: 5 days  . Minutes of Exercise per Session: 20 min  Stress: No Stress Concern Present  . Feeling of Stress : Not at all  Social Connections: Moderately Isolated  . Frequency of Communication with Friends and Family: Once a week  . Frequency of Social Gatherings with Friends and Family: Twice a week  . Attends Religious Services: Never  . Active Member of Clubs or Organizations: Yes  . Attends Archivist Meetings: 1 to 4 times per year  . Marital Status: Divorced  Human resources officer Violence: Not At Risk  . Fear of Current or Ex-Partner: No  . Emotionally Abused: No  . Physically Abused: No  . Sexually Abused: No   Family History  Problem Relation Age of Onset  . Diabetes Mother   . Hypertension Mother   . Stroke Mother   . Alcohol abuse Father   . Heart attack Father   . Heart disease Father   . Hypertension Father   . Cancer Sister   . Hyperlipidemia Sister   . Pancreatic cancer Sister   . Pancreatic cancer Brother   . Hypertension Son     REVIEW OF SYSTEMS: Reviewed with the patient as per HPI. PSYCH: Patient denies having dental phobia.   VITAL SIGNS: BP (!) 186/87 (BP Location: Right Arm)   Pulse 61   Temp 98.5 F (36.9 C) (Oral)    PHYSICAL EXAM: >> General:  Well-developed, comfortable and in no apparent distress. >> Neurological:  Alert and oriented to person, place and  time. >> Extraoral:  Facial symmetry present without any edema or  erythema.  No lymphadenopathy or swelling. TMJ asymptomatic without clicks or  crepitations. (+) Right side of neck notable for thickened scar tissue >> Maximum Interincisal Opening: 46 mm >> Intraoral:  Soft tissues appear well-perfused and mucous membranes moist.  FOM and vestibules soft and not raised. Oral cavity without mass or lesion. No signs of infection, parulis, sinus tract, edema or erythema evident upon exam. (+) High palatal vault (+) Bruxism   DENTAL EXAM: Hard tissue exam completed and charted.  >> Dentition:  Overall fair remaining dentition.  Missing teeth, existing restorations and crown and bridge work.   >> The patient is maintaining good oral hygiene.  >> Periodontal: Pink, healthy gingival tissue with blunted papilla.  Localized plaque accumulation. Generalized gingival recession. #13 and #15 are abutments to 3-unit PFM bridge have Class III mobility and are periodontally hopeless. >> Incipient caries: #18B incipient caries >> Endodontics: #10, #13, #19 and #30 are RCT with definitive full-coverage crowns >> Removable/Fixed Prosthodontics: Patient denies wearing partial dentures; he does have a nightguard that he wears at night for bruxism.  #4, #5, #19, #20 and #30 (premolar supernumerary) have PFM crowns. #6, #7, #8, #9, #10 and #11 have all-ceramic full-coverage crowns.  #13-#15 is a 3-unit PFM bridge with #14 acting as pontic; class III mobility of the entire bridge. >> Occlusion: Unable to assess molar occlusion adequately.  Non-functional supernumerary tooth in LRQ. #22 and #27 are distally rotated; #27 is non-functional and occludes in front of maxillary opposing teeth. >> Other findings: Incisal attrition teeth #22-#26, occlusal attrition teeth #12 and #18.  #12B(V), #18B(V), #23-#26MFLD and #29B(V) abfraction(s). Supernumerary premolar in the lower right quadrant posterior to #29.   RADIOGRAPHIC EXAM: PAN taken on 10/16/2019 imported from outside dental office  interpreted.  Full Mouth Series exposed and interpreted.  >> Condyles seated bilaterally in fossas.  No evidence of abnormal pathology.  All visualized osseous structures appear WNL.  >> Generalized moderate horizontal bone loss consistent with moderate periodontitis. #13 and #15 have periapical radiolucencies with surrounding severe vertical and horizontal bone loss.   >> Missing teeth, existing restorations, full-coverage crowns on several teeth. #10, #13, #19 and #30 have had previous endodontic therapy with gutta-percha filled ~2 mm or less from the apices. #13 has periapical residual infection/radiolucency.   ASSESSMENT:  1. SCC of oropharynx 2. Malignant neoplasm of base of tongue 3. Preoperative (pre-radiation) dental consultation 4. Missing teeth 5. 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 Loose teeth 9. Gingival recession, generalized 10. Attrition/wear 11. Abfraction/flexure 12. Malocclusion   PROCEDURE: 1. Upper and Lower alginate impressions taken and poured up in Type IV [employer] for fabrication of scatter protection devices.   2. Trismus appliance made using patient's baseline MIO (24 sticks).  Leta Speller, DAII demonstrated use of appliance.  Verbal and written postop instructions were given to the patient. 3. The common and significant side effects of radiation therapy to the head and neck were explained and discussed with the patient.  The discussion included side effects of trismus (limited opening), dysgeusia (loss of taste), xerostomia (dry mouth), radiation caries and osteoradionecrosis of the jaw.  I also discussed the importance of maintaining optimal oral hygiene and oral health before, during and after radiation to decrease the risk of developing radiation cavities and the need for any surgery such as extractions after therapy.     PLAN AND RECOMMENDATIONS: >  I discussed the risks, benefits, and complications of various  scenarios with the patient in relationship to their medical and dental conditions, which included infection such as osteoradionecrosis  that could potentially occur either before, during or after their anticipated radiation therapy if dental/oral concerns are not addressed.  I explained that if any chronic or acute dental/oral infection(s) are addressed and subsequently not maintained following medical optimization and recovery, their risk of the previously mentioned complications are just as high and could potentially occur postoperatively.  I explained all significant findings of the dental consultation with the patient including the upper left quadrant 3-unit bridge which he is already aware of needing eventual extraction, and the recommended care including extraction of teeth/complete prosthesis following completion of radiation therapy once he returns to his regular dentist in order to optimize him from a dental standpoint.  I explained that the teeth that are chronically infected are not in the field of radiation so I would not push to have them extracted prior to starting as to not delay his initiation of treatment, but would recommend addressing them as soon as he is finished so they do not become acutely infected or cause complications. The patient verbalized understanding of all findings, discussion, and recommendations. >>  We then discussed various treatment options to include no treatment, multiple extractions with alveoloplasty, pre-prosthetic surgery as indicated, periodontal therapy, dental restorations, root canal therapy, crown and bridge therapy, implant therapy, and replacement of missing teeth as indicated.  The patient verbalized understanding of all options, and currently wishes to proceed with extraction of indicated teeth following radiation therapy, as well as returning to his regular dentist every 4-6 mos for routine maintenance visits, radiographs and exams. >>>  Plan to discuss all  findings and recommendations with medical team and coordinate future care as needed.  <> The patient tolerated today's visit well.  All questions and concerns were addressed and answered, and the patient departed in stable condition.   I spent in excess of 120 minutes during the conduct of this consultation and >50% of this time involved direct face-to-face encounter for counseling and/or coordination of the patient's care. [place] Benson [place], D.M.D.

## 2020-10-28 DIAGNOSIS — C01 Malignant neoplasm of base of tongue: Secondary | ICD-10-CM | POA: Diagnosis not present

## 2020-10-28 DIAGNOSIS — Z888 Allergy status to other drugs, medicaments and biological substances status: Secondary | ICD-10-CM | POA: Diagnosis not present

## 2020-10-28 DIAGNOSIS — Z87891 Personal history of nicotine dependence: Secondary | ICD-10-CM | POA: Diagnosis not present

## 2020-10-28 DIAGNOSIS — C770 Secondary and unspecified malignant neoplasm of lymph nodes of head, face and neck: Secondary | ICD-10-CM | POA: Diagnosis not present

## 2020-10-30 ENCOUNTER — Other Ambulatory Visit (HOSPITAL_BASED_OUTPATIENT_CLINIC_OR_DEPARTMENT_OTHER): Payer: Self-pay

## 2020-10-30 ENCOUNTER — Inpatient Hospital Stay (HOSPITAL_COMMUNITY): Payer: Medicare Other | Attending: Hematology and Oncology | Admitting: Hematology and Oncology

## 2020-10-30 DIAGNOSIS — C109 Malignant neoplasm of oropharynx, unspecified: Secondary | ICD-10-CM

## 2020-10-30 MED ORDER — PROCHLORPERAZINE MALEATE 10 MG PO TABS: 10.0000 mg | ORAL_TABLET | Freq: Four times a day (QID) | ORAL | 1 refills | Status: DC | PRN

## 2020-10-30 MED ORDER — LIDOCAINE-PRILOCAINE 2.5-2.5 % EX CREA: TOPICAL_CREAM | CUTANEOUS | 3 refills | Status: DC

## 2020-10-30 MED ORDER — ONDANSETRON HCL 8 MG PO TABS: 8.0000 mg | ORAL_TABLET | Freq: Two times a day (BID) | ORAL | 1 refills | Status: DC | PRN

## 2020-10-30 MED ORDER — PFIZER-BIONT COVID-19 VAC-TRIS 30 MCG/0.3ML IM SUSP
INTRAMUSCULAR | 0 refills | Status: DC
Start: 2020-10-26 — End: 2020-11-04
  Filled 2020-10-30: qty 0.3, 1d supply, fill #0

## 2020-10-30 MED ORDER — LORAZEPAM 0.5 MG PO TABS: 0.5000 mg | ORAL_TABLET | Freq: Four times a day (QID) | ORAL | 0 refills | Status: DC | PRN

## 2020-10-30 NOTE — Progress Notes (Signed)
[place] NOTE  Patient Care Team: Vivi Barrack, MD as PCP - General (Family Medicine) Buford Dresser, MD as PCP - Cardiology (Cardiology) Chevis Pretty as Consulting Physician (Dentistry) [name], MD as Consulting Physician (Vascular Surgery) [name], [name], PA-C as Physician Assistant (Dermatology) Malmfelt, Stephani Police, RN as Oncology Nurse Navigator Eppie Gibson, MD as Consulting Physician (Radiation Oncology) Leta Baptist, MD as Consulting Physician (Otolaryngology)  CHIEF COMPLAINTS/PURPOSE OF CONSULTATION:  Oropharyngeal cancer.  ASSESSMENT & PLAN:  No problem-specific Assessment & Plan notes found for this encounter.  No orders of the defined types were placed in this encounter.   HISTORY OF PRESENTING ILLNESS:   Jeffrey [name] 80 y.o. male is here because of new diagnosis of oropharyngeal cancer.   Oncology History Overview Note  Patient first noticed to have neck mass about 2 months prior to presentation. He denies any complaints except for neck mass, no difficulty swallowing, pain at the base of the tongue. He went to see his PCP who recommended an [place] which demonstrated hypoechoic mass at the right level II a highly concerning for an enlarged pathologic LN. He was referred to ENT and CT neck was ordered. CT neck showed enlarged right level 2 node, asymmetric soft tissue at the right tongue base, favored to reflect asymmetric tonsillar issue. 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 PET CT showed primary mucosal neoplasm in the right tongue base area demonstrating hypermetabolism. No PET findings concerning for metastatic disease.   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HCC)  10/21/2020 Initial Diagnosis   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HCC)   10/21/2020 Cancer Staging   Staging form: Pharynx - HPV-Mediated Oropharynx, AJCC 8th  Edition - Clinical stage from 10/21/2020: Stage I (cT1, cN1, cM0, p16+) - Signed by Benay Pike, MD on 10/21/2020 Stage prefix: Initial diagnosis    Interval history  Jeffrey [name] is here for a telephone visit. He has met with the surgeon and the recommendation is to proceed with surgery followed by adjuvant CRT versus CRT alone.  Since his last visit, he has been feeling well.  He was hoping that he can move forward with treatment as soon as possible.  He denies any new health complaints.  REVIEW OF SYSTEMS:    Constitutional: Denies fevers, chills or abnormal night sweats Eyes: Denies blurriness of vision, double vision or watery eyes Ears, nose, mouth, throat, and face: Denies mucositis or sore throat Respiratory: Denies cough, dyspnea or wheezes Cardiovascular: Denies palpitation, chest discomfort or lower extremity swelling Gastrointestinal:  Denies nausea, heartburn or change in bowel habits Skin: Denies abnormal skin rashes Lymphatics: Denies new lymphadenopathy or easy bruising Neurological:Denies numbness, tingling or new weaknesses Behavioral/Psych: Mood is stable, no new changes  All other systems were reviewed with the patient and are negative.  MEDICAL HISTORY:  Past Medical History:  Diagnosis Date  . AAA (abdominal aortic aneurysm) ([place])   . Allergy   . Arthritis   . Colon polyps   . Coronary artery calcification   . Eczematous dermatitis   . GERD (gastroesophageal reflux disease)   . Hypertension   . Iliac artery stenosis, left ([place])   . 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 Osteopenia     SURGICAL HISTORY: Past Surgical History:  Procedure Laterality Date  . ABDOMINAL AORTIC ENDOVASCULAR FENESTRATED STENT GRAFT N/A 03/21/2018   Procedure: ABDOMINAL AORTIC ENDOVASCULAR FENESTRATED  STENT GRAFT ; [place] CT PERFORMED;  Surgeon: [name], MD;  Location: [facility];  Service: Vascular;  Laterality: N/A;  . Basal Skin Cancer   2012  . CATARACT EXTRACTION Bilateral   . MASS BIOPSY Right 09/25/2020   Procedure: OPEN NECK MASS BIOPSY;  Surgeon: Leta Baptist, MD;  Location: [facility];  Service: ENT;  Laterality: Right;  . TONSILLECTOMY    . TRANSURETHRAL RESECTION OF PROSTATE      SOCIAL HISTORY: Social History   Socioeconomic History  . Marital status: Divorced    Spouse name: Not on file  . Number of children: 1  . Years of education: Not on file  . Highest education level: Not on file  Occupational History  . Occupation: Retired    Fish farm manager: [employer]  Tobacco Use  . Smoking status: Former Smoker    Packs/day: 1.00    Years: 32.00    Pack years: 32.00    Types: Cigarettes    Quit date: 08/17/1990    Years since quitting: 30.2  . Smokeless tobacco: Never Used  Vaping Use  . Vaping Use: Never used  Substance and Sexual Activity  . Alcohol use: Yes    Comment: Occassionally, "when I feel like it"  . Drug use: No  . Sexual activity: Yes    Partners: Female  Other Topics Concern  . Not on file  Social History Narrative  . Not on file   Social Determinants of Health   Financial Resource Strain: Low Risk   . Difficulty of Paying Living Expenses: Not hard at all  Food Insecurity: No Food Insecurity  . Worried About Charity fundraiser in the Last Year: Never true  . Ran Out of Food in the Last Year: Never true  Transportation Needs: No Transportation Needs  . Lack of Transportation (Medical): No  . Lack of Transportation (Non-Medical): No  Physical Activity: Insufficiently Active  . Days of Exercise per Week: 5 days  . Minutes of Exercise per Session: 20 min  Stress: No Stress Concern Present  . Feeling of Stress : Not at all  Social Connections: Moderately Isolated  . Frequency of Communication with Friends and Family: Once a week  . Frequency of Social Gatherings with Friends and Family: Twice a week  . Attends Religious Services: Never  . Active Member of Clubs or  Organizations: Yes  . Attends Archivist Meetings: 1 to 4 times per year  . Marital Status: Divorced  Human resources officer Violence: Not At Risk  . Fear of Current or Ex-Partner: No  . Emotionally Abused: No  . Physically Abused: No  . Sexually Abused: No    FAMILY HISTORY: Family History  Problem Relation Age of Onset  . Diabetes Mother   . Hypertension Mother   . Stroke Mother   . Alcohol abuse Father   . Heart attack Father   . Heart disease Father   . Hypertension Father   . Cancer Sister   . Hyperlipidemia Sister   . Pancreatic cancer Sister   . Pancreatic cancer Brother   . Hypertension Son     ALLERGIES:  is allergic to ketotifen fumarate, mixed ragweed, and triamcinolone.  MEDICATIONS:  Current Outpatient Medications  Medication Sig Dispense Refill  . acetaminophen (TYLENOL) 500 MG tablet Take 1,000 mg by mouth every 6 (six) hours as needed for moderate pain or headache.    Marland Kitchen amLODipine (NORVASC) 5 MG tablet Take 1 tablet (5 mg total)  by mouth daily. 90 tablet 3  . Ascorbic Acid (VITAMIN C) 1000 MG tablet Take 1,000 mg by mouth daily.     Marland Kitchen atorvastatin (LIPITOR) 40 MG tablet TAKE 1 TABLET(40 MG) BY MOUTH DAILY AT 6 PM 90 tablet 1  . Calcium Carbonate-Vitamin D 600-200 MG-UNIT TABS Take 1 tablet by mouth every other day.     Marland Kitchen COVID-19 mRNA vaccine, Pfizer, 30 MCG/0.3ML injection INJECT AS DIRECTED .3 mL 0  . Cyanocobalamin 1000 MCG TBCR Take 1,000 mcg by mouth daily.     . Emollient (CERAVE) LOTN Apply 1 application topically daily as needed (after shower care).     . ferrous sulfate 325 (65 FE) MG tablet Take 325 mg by mouth every Wednesday.    . fexofenadine (ALLEGRA) 180 MG tablet Take 180 mg by mouth daily as needed for allergies.     . fluticasone (FLONASE) 50 MCG/ACT nasal spray Place 1 spray into both nostrils every other day.    Marland Kitchen GLUCOSAMINE-CHONDROITIN PO Take 1 tablet by mouth 2 (two) times daily.     Marland Kitchen losartan (COZAAR) 25 MG tablet TAKE 1  TABLET(25 MG) BY MOUTH TWICE DAILY 180 tablet 3  . Multiple Vitamins-Minerals (CENTRUM ADULTS PO) Take 1 tablet by mouth daily.     . Omega-3 1000 MG CAPS Take 1,000-2,000 mg by mouth See admin instructions. Take 1000 mg by mouth in the morning and take 2000 mg by mouth in the evening after supper     No current facility-administered medications for this visit.    PHYSICAL EXAMINATION: ECOG PERFORMANCE STATUS: 0 - Asymptomatic  Vital signs and physical examination not done, telephone visit.  LABORATORY DATA:  I have reviewed the data as listed Lab Results  Component Value Date   WBC 7.1 10/20/2020   HGB 11.2 (L) 10/20/2020   HCT 32.8 (L) 10/20/2020   MCV 95.1 10/20/2020   PLT 165 10/20/2020     Chemistry      Component Value Date/Time   NA 136 10/20/2020 1414   NA 135 02/03/2020 1134   K 4.3 10/20/2020 1414   CL 102 10/20/2020 1414   CO2 23 10/20/2020 1414   BUN 16 10/20/2020 1414   BUN 10 02/03/2020 1134   CREATININE 1.07 10/20/2020 1414   CREATININE 1.00 05/11/2020 1127      Component Value Date/Time   CALCIUM 9.4 10/20/2020 1414   ALKPHOS 79 10/20/2020 1414   AST 22 10/20/2020 1414   ALT 15 10/20/2020 1414   BILITOT 0.7 10/20/2020 1414      RADIOGRAPHIC STUDIES: I have personally reviewed the radiological images as listed and agreed with the findings in the report. NM PET Image Initial (PI) Skull Base To Thigh  Result Date: 10/12/2020 CLINICAL DATA:  Initial treatment strategy for head and neck cancer (squamous cell carcinoma). EXAM: NUCLEAR MEDICINE PET SKULL BASE TO THIGH TECHNIQUE: 9.94 mCi F-18 FDG was injected intravenously. Full-ring PET imaging was performed from the skull base to thigh after the radiotracer. CT data was obtained and used for attenuation correction and anatomic localization. Fasting blood glucose: 98 mg/dl COMPARISON:  Neck CT 09/11/2020 FINDINGS: Mediastinal blood pool activity: SUV max 2.34 Liver activity: SUV max NA NECK: The patient has  had an excisional biopsy of the right level 2 lymph node that was seen on the prior neck CT. Expected postoperative type changes in the surgical bed. No significant residual areas of hypermetabolism in this area. The patient's primary lesion is in the right tongue base  with a slightly irregular soft tissue mass on the CT scan correlating with hypermetabolism and SUV max of 7.73. No enlarged or hypermetabolic neck adenopathy. No supraclavicular adenopathy. Incidental CT findings: none CHEST: No enlarged or hypermetabolic supraclavicular or axillary lymph nodes. No mediastinal or hilar lymphadenopathy. No worrisome pulmonary nodules to suggest pulmonary metastatic disease. Incidental CT findings: Atherosclerotic calcifications involving the aorta and coronary arteries. ABDOMEN/PELVIS: No abnormal hypermetabolic activity within the liver, pancreas, adrenal glands, or spleen. No hypermetabolic lymph nodes in the abdomen or pelvis. Incidental CT findings: Aortoiliac stent graft is noted along with bilateral renal artery stents. No complicating features are identified. Colonic diverticulosis. TURP defect involving the prostate gland. SKELETON: No focal hypermetabolic activity to suggest skeletal metastasis. Incidental CT findings: none IMPRESSION: 1. Primary mucosal neoplasm in the right tongue base area demonstrating hypermetabolism. 2. Status post excisional biopsy of the enlarged right level 2 lymph node. No other enlarged or hypermetabolic neck nodes. 3. No PET-CT findings for metastatic disease involving the chest, abdomen, pelvis or osseous structures. Electronically Signed   By: Marijo Sanes M.D.   On: 10/12/2020 13:40   I connected with  [name] on 10/31/20 by a video enabled telephone application and verified that I am speaking with the correct person using two identifiers.   I discussed the limitations of evaluation and management by telemedicine. The patient expressed understanding and agreed to  proceed. I have spent 20 minutes in the care of this patient on the phone discussing again about chemotherapy, adverse effects from chemotherapy, need for port placement, NG tube, and additional time spent in coordination of care with the nurse navigation and scheduling team.     Benay Pike, MD 10/30/2020 11:05 AM

## 2020-10-30 NOTE — Progress Notes (Signed)
START ON PATHWAY REGIMEN - Head and Neck     A cycle is every 7 days:     Cisplatin   **Always confirm dose/schedule in your pharmacy ordering system**  Patient Characteristics: Oropharynx, HPV Positive, Preoperative or Nonsurgical Candidate (Clinical Staging), cT0-4, cN1-3 or cT3-4, cN0 Disease Classification: Oropharynx HPV Status: Positive (+) Therapeutic Status: Preoperative or Nonsurgical Candidate (Clinical Staging) AJCC T Category: cT1 AJCC 8 Stage Grouping: I AJCC N Category: cN1 AJCC M Category: cM0 Intent of Therapy: Curative Intent, Discussed with Patient 

## 2020-10-31 ENCOUNTER — Encounter (HOSPITAL_COMMUNITY): Payer: Self-pay | Admitting: Hematology and Oncology

## 2020-10-31 NOTE — Assessment & Plan Note (Signed)
This is a very pleasant 80 yr old male patient with TxN1M0 HPV positive SCC oropharynx referred to medical oncology for further recommendations.   He has seen Dr. Harriet Butte and the recommendation was to proceed with surgery followed by CRT versus CRT alone. Have discussed adverse effects of chemotherapy again, including but not limited to fatigue, nausea, vomiting, increased risk of infections, ototoxicity, nephrotoxicity with cisplatin and he understands that some of the side effects can be fatal and permanent. Discussed about port placement for chemotherapy administration which would be your convenience to him, cisplatin can be administered peripherally as well.  He would like to proceed with port placement.  We have discussed about G-tube because of anticipated severe mucositis during chemoradiation which may limit him from eating and will lead to rapid weight loss.  G-tube order placed. Will coordinate all of this and will coordinate the start date with radiation oncology.  Treatment plan placed.  Chemotherapy education will be scheduled and information sent to our scheduling team to coordinate all the visits and appointments.

## 2020-11-01 ENCOUNTER — Encounter (HOSPITAL_COMMUNITY): Payer: Self-pay | Admitting: Hematology and Oncology

## 2020-11-02 ENCOUNTER — Other Ambulatory Visit (HOSPITAL_COMMUNITY): Payer: Self-pay | Admitting: *Deleted

## 2020-11-02 ENCOUNTER — Telehealth (HOSPITAL_COMMUNITY): Payer: Self-pay

## 2020-11-02 ENCOUNTER — Other Ambulatory Visit: Payer: Self-pay

## 2020-11-02 NOTE — Telephone Encounter (Signed)
-----   Message from Arne Cleveland, MD sent at 11/02/2020  4:08 PM EDT ----- Regarding: RE: peg/port placement Ok for [facility] 1st  Colonic interposition on most recent petct but not on prior ct 07/10/19.  He needs PO barium 3 days pre procedure, we can check day of for possible safe window for same-day Gtube   (schedule both)  DDH    ----- Message ----- From: Danielle Dess Sent: 11/02/2020   3:51 PM EDT To: Ir Procedure Requests Subject: peg/port placement                             Procedure: Peg/port placement  Dx: Squamous cell carcinoma of oropharynx  Ordering: Dr. Benay Pike ([id number])  Imaging: NM Pet done 4/25 in epic  Please review!  Thanks,  Lia Foyer

## 2020-11-03 ENCOUNTER — Ambulatory Visit
Admission: RE | Admit: 2020-11-03 | Discharge: 2020-11-03 | Disposition: A | Discharge status: Home / Self Care | Payer: Medicare Other | Source: Ambulatory Visit | Attending: Radiation Oncology | Admitting: Radiation Oncology

## 2020-11-03 ENCOUNTER — Encounter: Payer: Self-pay | Admitting: Hematology and Oncology

## 2020-11-03 ENCOUNTER — Ambulatory Visit (INDEPENDENT_AMBULATORY_CARE_PROVIDER_SITE_OTHER): Payer: Medicare Other | Admitting: Dentistry

## 2020-11-03 ENCOUNTER — Other Ambulatory Visit: Payer: Self-pay

## 2020-11-03 ENCOUNTER — Inpatient Hospital Stay: Payer: Medicare Other | Admitting: Nutrition

## 2020-11-03 ENCOUNTER — Inpatient Hospital Stay: Payer: Medicare Other

## 2020-11-03 DIAGNOSIS — C01 Malignant neoplasm of base of tongue: Secondary | ICD-10-CM | POA: Insufficient documentation

## 2020-11-03 DIAGNOSIS — Z87891 Personal history of nicotine dependence: Secondary | ICD-10-CM | POA: Diagnosis not present

## 2020-11-03 DIAGNOSIS — Z463 Encounter for fitting and adjustment of dental prosthetic device: Secondary | ICD-10-CM

## 2020-11-03 DIAGNOSIS — Z510 Encounter for antineoplastic radiation therapy: Secondary | ICD-10-CM | POA: Insufficient documentation

## 2020-11-03 DIAGNOSIS — C109 Malignant neoplasm of oropharynx, unspecified: Secondary | ICD-10-CM

## 2020-11-03 NOTE — Progress Notes (Signed)
Department of Dental Medicine    ROUTINE VISIT  Service Date:   11/03/2020  Patient Name:  Jeffrey [name] Date of Birth:   [birth year] Medical Record Number: [id number]  Referring Provider:              Eppie Gibson, MD    SUMMARY    1. Today's visit:  Delivered upper and lower scatter protection devices.  2. Plan: Follow-up with patient after the completion of radiation therapy.  He will then return to his primary dentist for routine dental care.    PROGRESS NOTE   COVID 19 SCREENING:  The patient denies symptoms concerning for COVID-19 infection including fever, chills, cough, or newly developed shortness of breath.    HISTORY OF PRESENT ILLNESS:   Jeffrey [name] presents today for delivery of upper and lower scatter protection devices.  Medical and dental history reviewed with the patient.    CHIEF COMPLAINT:   Here for a routine dental appointment.  Patient with no complaints.   Patient Active Problem List   Diagnosis Date Noted  . 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[place]) 10/21/2020  . Malignant neoplasm of base of tongue ([place]) 10/21/2020  . Hyperglycemia 04/09/2020  . Chronic neck pain 04/09/2020  . Coronary artery disease due to calcified coronary lesion 12/27/2018  . Osteopenia 02/24/2017  . Abdominal aortic aneurysm (AAA) without rupture ([place]) 07/06/2015  . Basal cell carcinoma 07/06/2015  . Essential hypertension 07/06/2015  . History of colon polyps 07/06/2015  . HLD (hyperlipidemia) 07/06/2015   Past Medical History:  Diagnosis Date  . AAA (abdominal aortic aneurysm) ([place])   . Allergy   . Arthritis   . Colon polyps   . Coronary artery calcification   . Eczematous dermatitis   . GERD (gastroesophageal reflux disease)   . Hypertension   . Iliac artery stenosis, left ([place])   . 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 Osteopenia    Current Outpatient Medications  Medication Sig Dispense Refill  .  acetaminophen (TYLENOL) 500 MG tablet Take 1,000 mg by mouth every 6 (six) hours as needed for moderate pain or headache.    Marland Kitchen amLODipine (NORVASC) 5 MG tablet Take 1 tablet (5 mg total) by mouth daily. 90 tablet 3  . Ascorbic Acid (VITAMIN C) 1000 MG tablet Take 1,000 mg by mouth daily.     Marland Kitchen atorvastatin (LIPITOR) 40 MG tablet TAKE 1 TABLET(40 MG) BY MOUTH DAILY AT 6 PM 90 tablet 1  . Calcium Carbonate-Vitamin D 600-200 MG-UNIT TABS Take 1 tablet by mouth every other day.     Marland Kitchen COVID-19 mRNA Vac-TriS, Pfizer, (PFIZER-BIONT COVID-19 VAC-TRIS) SUSP injection Inject into the muscle. 0.3 mL 0  . COVID-19 mRNA vaccine, Pfizer, 30 MCG/0.3ML injection INJECT AS DIRECTED .3 mL 0  . Cyanocobalamin 1000 MCG TBCR Take 1,000 mcg by mouth daily.     . Emollient (CERAVE) LOTN Apply 1 application topically daily as needed (after shower care).     . ferrous sulfate 325 (65 FE) MG tablet Take 325 mg by mouth every Wednesday.    . fexofenadine (ALLEGRA) 180 MG tablet Take 180 mg by mouth daily as needed for allergies.     . fluticasone (FLONASE) 50 MCG/ACT nasal spray Place 1 spray into both nostrils every other day.    Marland Kitchen GLUCOSAMINE-CHONDROITIN PO Take 1 tablet by mouth 2 (two) times daily.     Marland Kitchen lidocaine-prilocaine (EMLA) cream Apply to affected area once 30 g 3  .  LORazepam (ATIVAN) 0.5 MG tablet Take 1 tablet (0.5 mg total) by mouth every 6 (six) hours as needed (Nausea or vomiting). 30 tablet 0  . losartan (COZAAR) 25 MG tablet TAKE 1 TABLET(25 MG) BY MOUTH TWICE DAILY 180 tablet 3  . Multiple Vitamins-Minerals (CENTRUM ADULTS PO) Take 1 tablet by mouth daily.     . Omega-3 1000 MG CAPS Take 1,000-2,000 mg by mouth See admin instructions. Take 1000 mg by mouth in the morning and take 2000 mg by mouth in the evening after supper    . ondansetron (ZOFRAN) 8 MG tablet Take 1 tablet (8 mg total) by mouth 2 (two) times daily as needed. Start on the third day after cisplatin chemotherapy. 30 tablet 1  .  prochlorperazine (COMPAZINE) 10 MG tablet Take 1 tablet (10 mg total) by mouth every 6 (six) hours as needed (Nausea or vomiting). 30 tablet 1   No current facility-administered medications for this visit.   Allergies  Allergen Reactions  . Ketotifen Fumarate Swelling and Other (See Comments)    Eye swelling  . Mixed Ragweed     Hay fever  . Triamcinolone Hives and Rash     VITALS: BP (!) 167/82 (BP Location: Left Arm)   Pulse (!) 59   Temp 98.1 F (36.7 C) (Oral)     ASSESSMENT:   Patient with anticipated radiation therapy to the head and neck.    PROCEDURES: 1. Appliances were tried in and adjusted as needed. Polished. 2. Postoperative instructions were provided in a written and verbal format concerning the use and care of appliances.    PLAN: 1.  Patient to return to our clinic following the completion of radiation therapy for a follow-up appointment.  He will then return to his regular dentist for routine dental care including cleanings and exams. 2.  Patient instructed to call if questions or problems arise.    All questions and concerns were invited and addressed.  The patient tolerated today's visit well and departed in stable condition.   Hampton Benson [place], DMD

## 2020-11-03 NOTE — Progress Notes (Signed)
Met with patient at registration to introduce myself as Financial Resource Specialist and to offer available resources.  Discussed one-time $1000 [employer] grant and qualifications to assist with personal expenses while going through treatment.  Gave him my card if interested in applying and for any additional financial questions or concerns.  

## 2020-11-03 NOTE — Progress Notes (Signed)
Received request to try to see patient today. Patient is a 80 year old male diagnosed with oropharyngeal cancer, HPV positive and followed by Dr. Isidore Moos.  He is scheduled to receive cisplatin and radiation therapy. He is scheduled for a feeding tube and port on Friday this week.  Past medical history includes AAA, GERD, hypertension, basal cell carcinoma, osteopenia, tobacco, and occasional alcohol.  Medications include vitamin C, calcium carbonate, vitamin B12, ferrous sulfate, multivitamin, and omega-3 fatty acids.  Labs were reviewed.  Height: 5 feet 9 inch. Weight: 203.7 pounds on May 3. Usual body weight: About 200 pounds. BMI: 30.08.  Estimated nutrition needs: 2000-2300 cal, 110-125 g protein, 2.3 L fluid.  Patient just finished chemo education and is feeling quite overwhelmed with all the information he has received.  He currently is not having nutrition impact symptoms.  He is pleased he will be able to start his treatment sooner rather than later.  Nutrition diagnosis:  Food and nutrition related knowledge deficit related to oropharyngeal cancer as evidenced by no prior need for nutrition related information.  Intervention: Educated patient on importance of smaller more frequent meals and snacks with adequate calories and protein to minimize weight loss throughout treatment. Reviewed foods high in protein. Provided nutrition facts sheets. Briefly educated patient on feeding tube placement.  We discussed flushing feeding tube with water daily using 60 mL once a day.  Also brief education provided on how to care for feeding tube.  I provided patient with a case of Osmolite 1.5 and supplies for tube feeding care.  Patient will need additional education on caring for his feeding tube by nursing before it is placed. Reviewed oral nutrition supplements and provided samples. Brief education provided on dry mouth and using a baking soda salt water rinses.  Monitoring, evaluation,  goals: Patient will tolerate adequate calories and protein to minimize weight loss.  Next visit: Thursday, June 9 during infusion.  **Disclaimer: This note was dictated with voice recognition software. Similar sounding words can inadvertently be transcribed and this note may contain transcription errors which may not have been corrected upon publication of note.**

## 2020-11-04 ENCOUNTER — Encounter: Payer: Self-pay | Admitting: Hematology and Oncology

## 2020-11-04 ENCOUNTER — Other Ambulatory Visit: Payer: Self-pay

## 2020-11-04 DIAGNOSIS — C109 Malignant neoplasm of oropharynx, unspecified: Secondary | ICD-10-CM

## 2020-11-05 ENCOUNTER — Encounter: Payer: Self-pay | Admitting: General Practice

## 2020-11-05 ENCOUNTER — Other Ambulatory Visit: Payer: Self-pay | Admitting: Radiology

## 2020-11-05 NOTE — Progress Notes (Signed)
[place] Clinical Social Work  Initial Assessment   Jeffrey [name] is a 80 y.o. year old male contacted by phone. Clinical Social Work was referred by medical oncologist for assessment of psychosocial needs.   SDOH (Social Determinants of Health) assessments performed: Yes   Distress Screen completed: Yes ONCBCN DISTRESS SCREENING 10/20/2020  Distress experienced in past week (1-10) 4  Emotional problem type Nervousness/Anxiety;Adjusting to illness      Family/Social Information:  . Housing Arrangement: patient lives alone . Family members/support persons in your life?Son lives nearby, brothers in area. Has nieces and nephews.  . Transportation concerns: drives and also has family to help if needed . Employment: Retired.  . Income source: retirement income . Financial concerns: No o Type of concern: None . Food access concerns: none . Religious or spiritual practice: none noted . Medication Concerns: none . Services Currently in place:  None at this time  Coping/ Adjustment to diagnosis: . Patient understands treatment plan and what happens next?  "This is way bigger than anything I have ever expected."  Started approx 07/2020, found lump on neck.  Called PCP, who ordered several tests, referred to Dr Benjamine Mola for biopsy.  "It is what it is."  Has never worried about cancer because no one in my family has ever had cancer.  About to start concurrent chemoradiation.  Glad treatment is starting soon, "want to get it over with."  . Concerns about diagnosis and/or treatment: Pain or discomfort during procedures and anxious to get started in treatment . Patient reported stressors: Adjusting to my illness . Hopes and priorities: get back to a normal life, go out to eat, travel, visit friends . Patient enjoys reading, watching TV and brain games . Current coping skills/ strengths: Ability for insight, Average or above average intelligence, Capable of independent living, General fund of knowledge and  Supportive family/friends    SUMMARY: Current SDOH Barriers:  . none  Interventions: . Discussed common feeling and emotions when being diagnosed with cancer, and the importance of support during treatment . Informed patient of the support team roles and support services at [facility] . Provided CSW contact information and encouraged patient to call with any questions or concerns . Provided patient with information about Patient and [place]   Follow Up Plan: Patient will contact CSW with any support or resource needs Patient verbalizes understanding of plan: Yes    Beverely Pace , [place], [place] Worker Phone:  [phone]

## 2020-11-06 ENCOUNTER — Ambulatory Visit (HOSPITAL_COMMUNITY): Payer: Medicare Other

## 2020-11-06 ENCOUNTER — Encounter (HOSPITAL_COMMUNITY): Payer: Self-pay

## 2020-11-06 ENCOUNTER — Ambulatory Visit (HOSPITAL_COMMUNITY)
Admission: RE | Admit: 2020-11-06 | Discharge: 2020-11-06 | Disposition: A | Discharge status: Home / Self Care | Payer: Medicare Other | Source: Ambulatory Visit | Attending: Hematology and Oncology | Admitting: Hematology and Oncology

## 2020-11-06 ENCOUNTER — Other Ambulatory Visit: Payer: Self-pay

## 2020-11-06 DIAGNOSIS — I10 Essential (primary) hypertension: Secondary | ICD-10-CM | POA: Insufficient documentation

## 2020-11-06 DIAGNOSIS — C4442 Squamous cell carcinoma of skin of scalp and neck: Secondary | ICD-10-CM | POA: Diagnosis not present

## 2020-11-06 DIAGNOSIS — Z79899 Other long term (current) drug therapy: Secondary | ICD-10-CM | POA: Diagnosis not present

## 2020-11-06 DIAGNOSIS — Z8249 Family history of ischemic heart disease and other diseases of the circulatory system: Secondary | ICD-10-CM | POA: Insufficient documentation

## 2020-11-06 DIAGNOSIS — Z888 Allergy status to other drugs, medicaments and biological substances status: Secondary | ICD-10-CM | POA: Diagnosis not present

## 2020-11-06 DIAGNOSIS — Z95828 Presence of other vascular implants and grafts: Secondary | ICD-10-CM | POA: Insufficient documentation

## 2020-11-06 DIAGNOSIS — Z85828 Personal history of other malignant neoplasm of skin: Secondary | ICD-10-CM | POA: Insufficient documentation

## 2020-11-06 DIAGNOSIS — Z7982 Long term (current) use of aspirin: Secondary | ICD-10-CM | POA: Insufficient documentation

## 2020-11-06 DIAGNOSIS — Z510 Encounter for antineoplastic radiation therapy: Secondary | ICD-10-CM | POA: Diagnosis not present

## 2020-11-06 DIAGNOSIS — C01 Malignant neoplasm of base of tongue: Secondary | ICD-10-CM | POA: Diagnosis not present

## 2020-11-06 DIAGNOSIS — Z87891 Personal history of nicotine dependence: Secondary | ICD-10-CM | POA: Insufficient documentation

## 2020-11-06 DIAGNOSIS — C109 Malignant neoplasm of oropharynx, unspecified: Secondary | ICD-10-CM | POA: Diagnosis not present

## 2020-11-06 DIAGNOSIS — I714 Abdominal aortic aneurysm, without rupture: Secondary | ICD-10-CM | POA: Insufficient documentation

## 2020-11-06 DIAGNOSIS — Z809 Family history of malignant neoplasm, unspecified: Secondary | ICD-10-CM | POA: Insufficient documentation

## 2020-11-06 DIAGNOSIS — Z86010 Personal history of colonic polyps: Secondary | ICD-10-CM | POA: Diagnosis not present

## 2020-11-06 DIAGNOSIS — Z800 Family history of malignant neoplasm of digestive organs: Secondary | ICD-10-CM | POA: Insufficient documentation

## 2020-11-06 DIAGNOSIS — Z431 Encounter for attention to gastrostomy: Secondary | ICD-10-CM | POA: Diagnosis not present

## 2020-11-06 HISTORY — PX: IR IMAGING GUIDED PORT INSERTION: IMG5740

## 2020-11-06 HISTORY — PX: IR GASTROSTOMY TUBE MOD SED: IMG625

## 2020-11-06 LAB — BASIC METABOLIC PANEL
Anion gap: 8 (ref 5–15)
BUN: 29 mg/dL — ABNORMAL HIGH (ref 8–23)
CO2: 22 mmol/L (ref 22–32)
Calcium: 9.7 mg/dL (ref 8.9–10.3)
Chloride: 103 mmol/L (ref 98–111)
Creatinine, Ser: 1.09 mg/dL (ref 0.61–1.24)
GFR, Estimated: >60 mL/min (ref >60)
Glucose, Bld: 94 mg/dL (ref 70–99)
Potassium: 5.2 mmol/L — ABNORMAL HIGH (ref 3.5–5.1)
Sodium: 133 mmol/L — ABNORMAL LOW (ref 135–145)

## 2020-11-06 LAB — CBC
HCT: 34.7 % — ABNORMAL LOW (ref 39.0–52.0)
Hemoglobin: 11.8 g/dL — ABNORMAL LOW (ref 13.0–17.0)
MCH: 32.9 pg (ref 26.0–34.0)
MCHC: 34 g/dL (ref 30.0–36.0)
MCV: 96.7 fL (ref 80.0–100.0)
Platelets: 160 10*3/uL (ref 150–400)
RBC: 3.59 MIL/uL — ABNORMAL LOW (ref 4.22–5.81)
RDW: 13.6 % (ref 11.5–15.5)
WBC: 7.3 10*3/uL (ref 4.0–10.5)
nRBC: 0 % (ref 0.0–0.2)

## 2020-11-06 LAB — PROTIME-INR
INR: 1 (ref 0.8–1.2)
Prothrombin Time: 13.2 seconds (ref 11.4–15.2)

## 2020-11-06 MED ORDER — MIDAZOLAM HCL 2 MG/2ML IJ SOLN
INTRAMUSCULAR | Status: AC
  Filled 2020-11-06: qty 2

## 2020-11-06 MED ORDER — FENTANYL CITRATE (PF) 100 MCG/2ML IJ SOLN
INTRAMUSCULAR | Status: AC
  Filled 2020-11-06: qty 2

## 2020-11-06 MED ORDER — CEFAZOLIN SODIUM-DEXTROSE 2-4 GM/100ML-% IV SOLN
INTRAVENOUS | Status: AC
  Administered 2020-11-06: 2 g via INTRAVENOUS
  Filled 2020-11-06: qty 100

## 2020-11-06 MED ORDER — FENTANYL CITRATE (PF) 100 MCG/2ML IJ SOLN
INTRAMUSCULAR | Status: AC | PRN
  Administered 2020-11-06 (×2): 50 ug via INTRAVENOUS

## 2020-11-06 MED ORDER — LIDOCAINE-EPINEPHRINE 1 %-1:100000 IJ SOLN
INTRAMUSCULAR | Status: AC | PRN
  Administered 2020-11-06: 10 mL

## 2020-11-06 MED ORDER — GLUCAGON HCL RDNA (DIAGNOSTIC) 1 MG IJ SOLR
INTRAMUSCULAR | Status: AC
  Filled 2020-11-06: qty 1

## 2020-11-06 MED ORDER — LIDOCAINE-EPINEPHRINE 1 %-1:100000 IJ SOLN
INTRAMUSCULAR | Status: AC
  Filled 2020-11-06: qty 1

## 2020-11-06 MED ORDER — LIDOCAINE HCL (PF) 1 % IJ SOLN
INTRAMUSCULAR | Status: AC
  Filled 2020-11-06: qty 30

## 2020-11-06 MED ORDER — IOHEXOL 300 MG/ML  SOLN
50.0000 mL | Freq: Once | INTRAMUSCULAR | Status: AC | PRN
  Administered 2020-11-06: 20 mL

## 2020-11-06 MED ORDER — LIDOCAINE HCL 1 % IJ SOLN
INTRAMUSCULAR | Status: AC | PRN
  Administered 2020-11-06: 10 mL

## 2020-11-06 MED ORDER — HEPARIN SOD (PORK) LOCK FLUSH 100 UNIT/ML IV SOLN
INTRAVENOUS | Status: AC | PRN
  Administered 2020-11-06: 500 [IU] via INTRAVENOUS

## 2020-11-06 MED ORDER — HEPARIN SOD (PORK) LOCK FLUSH 100 UNIT/ML IV SOLN
INTRAVENOUS | Status: AC
  Filled 2020-11-06: qty 5

## 2020-11-06 MED ORDER — SODIUM CHLORIDE 0.9 % IV SOLN: INTRAVENOUS | Status: DC

## 2020-11-06 MED ORDER — CEFAZOLIN SODIUM-DEXTROSE 2-4 GM/100ML-% IV SOLN: 2.0000 g | INTRAVENOUS | Status: AC

## 2020-11-06 MED ORDER — MIDAZOLAM HCL 2 MG/2ML IJ SOLN
INTRAMUSCULAR | Status: AC | PRN
  Administered 2020-11-06: 1 mg via INTRAVENOUS
  Administered 2020-11-06: 2 mg via INTRAVENOUS

## 2020-11-06 MED ORDER — GLUCAGON HCL RDNA (DIAGNOSTIC) 1 MG IJ SOLR
INTRAMUSCULAR | Status: AC | PRN
  Administered 2020-11-06: .5 mg via INTRAVENOUS

## 2020-11-06 NOTE — Procedures (Signed)
Interventional Radiology Procedure Note  Procedure: fluoro 20 fr gtube    Complications: None  Estimated Blood Loss:  min  Findings: Confirmed in the stomach    Tamera Punt, MD

## 2020-11-06 NOTE — H&P (Signed)
Chief Complaint: Patient was seen in consultation today for image guided Port-A-Cath placement and gastrostomy tube placement  at the request of [place]  Referring Physician(s): Iruku,Praveena  Supervising Physician: Daryll Brod  Patient Status: [facility] - Out-pt  History of Present Illness: Jeffrey [name] is a 80 y.o. 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  Patient was evaluated by his dermatologist on 07/08/2020, was found to have a neoplasm of uncertain behavior of skin on the right submandibular area.  Patient underwent biopsy of the skin lesions which showed revealed basal cell carcinoma.  Patient underwent ultrasound soft tissue head and neck subsequent CT soft tissue neck in March 2022 which showed a enlarged large level 2 not measuring 2.7 x 2.5 cm.  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  Patient was referred to oncology for further evaluation and management of newly diagnosed neoplasm and patient underwent a PET scan which showed a primary mucosal neoplasm in the right tongue base area demonstrating hypermetabolic some with no metastatic disease. A chemoradiation therapy was recommended to the patient by oncology as a treatment option for the tongue cancer.  A gastrostomy tube placement was also recommended to the patient due to anticipated severe mucositis during chemoradiation which may limit patient from eating and will lead to rapid weight loss.  After thorough discussion and shared decision making with his oncology team, patient decided to undergo the chemoradiation therapy and the G-tube placement.  IR was requested for image guided Port-A-Cath and gastrostomy tube placement.  Patient laying in bed, not in acute distress.  Denise headache, fever, chills,  shortness of breath, cough, chest pain, abdominal pain, nausea ,vomiting, and bleeding.    Past Medical History:  Diagnosis Date  . AAA (abdominal aortic aneurysm) ([place])   . Allergy   . Arthritis   . Colon polyps   . Coronary artery calcification   . Eczematous dermatitis   . GERD (gastroesophageal reflux disease)   . Hypertension   . Iliac artery stenosis, left ([place])   . 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 Osteopenia     Past Surgical History:  Procedure Laterality Date  . ABDOMINAL AORTIC ENDOVASCULAR FENESTRATED STENT GRAFT N/A 03/21/2018   Procedure: ABDOMINAL AORTIC ENDOVASCULAR FENESTRATED STENT GRAFT ; [place] CT PERFORMED;  Surgeon: [name], MD;  Location: [facility];  Service: Vascular;  Laterality: N/A;  . Basal Skin Cancer  2012  . CATARACT EXTRACTION Bilateral   . MASS BIOPSY Right 09/25/2020   Procedure: OPEN NECK MASS BIOPSY;  Surgeon: Leta Baptist, MD;  Location: [facility];  Service: ENT;  Laterality: Right;  . TONSILLECTOMY    . TRANSURETHRAL RESECTION OF PROSTATE      Allergies: Ketotifen fumarate, Mixed ragweed, and Triamcinolone  Medications: Prior to Admission medications   Medication Sig Start Date End Date Taking? Authorizing Provider  acetaminophen (TYLENOL) 650 MG CR tablet Take 1,300 mg by mouth every 8 (eight) hours as needed for pain.    [provider]  amLODipine (NORVASC) 5 MG tablet Take 1 tablet (5 mg total) by mouth daily. 02/03/20 01/28/21  Buford Dresser, MD  ARTIFICIAL TEAR SOLUTION OP Place 1 drop into both eyes daily as needed (dry eyes).    [provider]  Ascorbic Acid (VITAMIN C) 1000 MG tablet Take  1,000 mg by mouth daily.     [provider]  aspirin EC 81 MG tablet Take 81 mg by mouth daily. Swallow whole.    [provider]  atorvastatin (LIPITOR) 40 MG tablet TAKE 1 TABLET(40 MG) BY MOUTH DAILY AT 6 PM Patient taking differently: Take  40 mg by mouth daily at 6 PM. 10/05/20   Buford Dresser, MD  Calcium Carbonate-Vitamin D 600-200 MG-UNIT TABS Take 1 tablet by mouth every other day.     [provider]  Cyanocobalamin 1000 MCG TBCR Take 1,000 mcg by mouth daily.     [provider]  Emollient (CERAVE) LOTN Apply 1 application topically daily as needed (after shower care).     [provider]  ferrous sulfate 325 (65 FE) MG tablet Take 325 mg by mouth every other day.    [provider]  fluticasone (FLONASE) 50 MCG/ACT nasal spray Place 1 spray into both nostrils every other day.    [provider]  GLUCOSAMINE-CHONDROITIN PO Take 1 tablet by mouth 2 (two) times daily.     [provider]  lidocaine-prilocaine (EMLA) cream Apply to affected area once Patient taking differently: Apply 1 application topically daily as needed (port access). 10/30/20   Benay Pike, MD  LORazepam (ATIVAN) 0.5 MG tablet Take 1 tablet (0.5 mg total) by mouth every 6 (six) hours as needed (Nausea or vomiting). 10/30/20   Benay Pike, MD  losartan (COZAAR) 25 MG tablet TAKE 1 TABLET(25 MG) BY MOUTH TWICE DAILY Patient taking differently: Take 25 mg by mouth 2 (two) times daily. 04/13/20   Vivi Barrack, MD  Multiple Vitamins-Minerals (CENTRUM ADULTS PO) Take 1 tablet by mouth daily.     [provider]  Omega-3 1000 MG CAPS Take 1,000 mg by mouth 2 (two) times daily.    [provider]  ondansetron (ZOFRAN) 8 MG tablet Take 1 tablet (8 mg total) by mouth 2 (two) times daily as needed. Start on the third day after cisplatin chemotherapy. 10/30/20   Benay Pike, MD  prochlorperazine (COMPAZINE) 10 MG tablet Take 1 tablet (10 mg total) by mouth every 6 (six) hours as needed (Nausea or vomiting). 10/30/20   Benay Pike, MD     Family History  Problem Relation Age of Onset  . Diabetes Mother   . Hypertension Mother   . Stroke Mother   . Alcohol abuse Father   .  Heart attack Father   . Heart disease Father   . Hypertension Father   . Cancer Sister   . Hyperlipidemia Sister   . Pancreatic cancer Sister   . Pancreatic cancer Brother   . Hypertension Son     Social History   Socioeconomic History  . Marital status: Divorced    Spouse name: Not on file  . Number of children: 1  . Years of education: Not on file  . Highest education level: Not on file  Occupational History  . Occupation: Retired    Fish farm manager: [employer]  Tobacco Use  . Smoking status: Former Smoker    Packs/day: 1.00    Years: 32.00    Pack years: 32.00    Types: Cigarettes    Quit date: 08/17/1990    Years since quitting: 30.2  . Smokeless tobacco: Never Used  Vaping Use  . Vaping Use: Never used  Substance and Sexual Activity  . Alcohol use: Yes    Comment: Occassionally, "when I feel like it"  . Drug use:  No  . Sexual activity: Yes    Partners: Female  Other Topics Concern  . Not on file  Social History Narrative  . Not on file   Social Determinants of Health   Financial Resource Strain: Low Risk   . Difficulty of Paying Living Expenses: Not hard at all  Food Insecurity: No Food Insecurity  . Worried About Charity fundraiser in the Last Year: Never true  . Ran Out of Food in the Last Year: Never true  Transportation Needs: No Transportation Needs  . Lack of Transportation (Medical): No  . Lack of Transportation (Non-Medical): No  Physical Activity: Insufficiently Active  . Days of Exercise per Week: 5 days  . Minutes of Exercise per Session: 20 min  Stress: No Stress Concern Present  . Feeling of Stress : Not at all  Social Connections: Moderately Isolated  . Frequency of Communication with Friends and Family: Once a week  . Frequency of Social Gatherings with Friends and Family: Twice a week  . Attends Religious Services: Never  . Active Member of Clubs or Organizations: Yes  . Attends Archivist Meetings: 1 to 4 times per year  .  Marital Status: Divorced     Review of Systems: A 12 point ROS discussed and pertinent positives are indicated in the HPI above.  All other systems are negative.   Vital Signs: BP (!) 184/94   Pulse 70   Temp 98.1 F (36.7 C)   Ht 5\' 10"  (1.778 m)   Wt 200 lb (90.7 kg)   SpO2 100%   BMI 28.70 kg/m   Physical Exam  Vitals and nursing note reviewed.  Constitutional:      General: He is not in acute distress.    Appearance: Normal appearance.  HENT:     Head: Normocephalic and atraumatic.     Mouth/Throat:     Mouth: Mucous membranes are moist.     Pharynx: Oropharynx is clear.  Cardiovascular:     Rate and Rhythm: Normal rate and regular rhythm.     Pulses: Normal pulses.     Heart sounds: Normal heart sounds.  Pulmonary:     Effort: Pulmonary effort is normal.     Breath sounds: Normal breath sounds. No wheezing, rhonchi or rales.  Abdominal:     General: Bowel sounds are normal. There is no distension.     Palpations: Abdomen is soft.  Skin:    General: Skin is warm and dry.  Neurological:     Mental Status: He is alert and oriented to person, place, and time.  Psychiatric:        Mood and Affect: Mood normal.        Behavior: Behavior normal.    MD Evaluation Airway: WNL Heart: WNL Abdomen: WNL Chest/ Lungs: WNL ASA  Classification: 3 Mallampati/Airway Score: Two  Imaging: NM PET Image Initial (PI) Skull Base To Thigh  Result Date: 10/12/2020 CLINICAL DATA:  Initial treatment strategy for head and neck cancer (squamous cell carcinoma). EXAM: NUCLEAR MEDICINE PET SKULL BASE TO THIGH TECHNIQUE: 9.94 mCi F-18 FDG was injected intravenously. Full-ring PET imaging was performed from the skull base to thigh after the radiotracer. CT data was obtained and used for attenuation correction and anatomic localization. Fasting blood glucose: 98 mg/dl COMPARISON:  Neck CT 09/11/2020 FINDINGS: Mediastinal blood pool activity: SUV max 2.34 Liver activity: SUV max NA NECK:  The patient has had an excisional biopsy of the right level 2 lymph node  that was seen on the prior neck CT. Expected postoperative type changes in the surgical bed. No significant residual areas of hypermetabolism in this area. The patient's primary lesion is in the right tongue base with a slightly irregular soft tissue mass on the CT scan correlating with hypermetabolism and SUV max of 7.73. No enlarged or hypermetabolic neck adenopathy. No supraclavicular adenopathy. Incidental CT findings: none CHEST: No enlarged or hypermetabolic supraclavicular or axillary lymph nodes. No mediastinal or hilar lymphadenopathy. No worrisome pulmonary nodules to suggest pulmonary metastatic disease. Incidental CT findings: Atherosclerotic calcifications involving the aorta and coronary arteries. ABDOMEN/PELVIS: No abnormal hypermetabolic activity within the liver, pancreas, adrenal glands, or spleen. No hypermetabolic lymph nodes in the abdomen or pelvis. Incidental CT findings: Aortoiliac stent graft is noted along with bilateral renal artery stents. No complicating features are identified. Colonic diverticulosis. TURP defect involving the prostate gland. SKELETON: No focal hypermetabolic activity to suggest skeletal metastasis. Incidental CT findings: none IMPRESSION: 1. Primary mucosal neoplasm in the right tongue base area demonstrating hypermetabolism. 2. Status post excisional biopsy of the enlarged right level 2 lymph node. No other enlarged or hypermetabolic neck nodes. 3. No PET-CT findings for metastatic disease involving the chest, abdomen, pelvis or osseous structures. Electronically Signed   By: Marijo Sanes M.D.   On: 10/12/2020 13:40    Labs:  CBC: Recent Labs    07/17/20 1355 09/03/20 1428 10/20/20 1414 11/06/20 0653  WBC 7.6 6.9 7.1 7.3  HGB 11.5* 11.4* 11.2* 11.8*  HCT 32.3* 32.6* 32.8* 34.7*  PLT 170.0 152.0 165 160    COAGS: Recent Labs    11/06/20 0653  INR 1.0    BMP: Recent Labs     02/03/20 1134 04/09/20 1417 05/11/20 1127 09/11/20 1333 09/21/20 0945 10/20/20 1414 11/06/20 0653  NA 135   < > 134*  --  133* 136 133*  K 5.3*   < > 5.1  --  5.1 4.3 5.2*  CL 97   < > 99  --  102 102 103  CO2 23   < > 26  --  23 23 22   GLUCOSE 86   < > 93  --  86 102* 94  BUN 10   < > 13  --  11 16 29*  CALCIUM 9.2   < > 9.6  --  9.0 9.4 9.7  CREATININE 1.09   < > 1.00 1.20 1.16 1.07 1.09  GFRNONAA 65  --   --   --  >60 >60 >60  GFRAA 75  --   --   --   --   --   --    < > = values in this interval not displayed.    LIVER FUNCTION TESTS: Recent Labs    04/09/20 1417 10/20/20 1414  BILITOT 0.7 0.7  AST 21 22  ALT 16 15  ALKPHOS  --  79  PROT 7.6 7.9  ALBUMIN  --  4.2    TUMOR MARKERS: No results for input(s): AFPTM, CEA, CA199, CHROMGRNA in the last 8760 hours.  Assessment and Plan: 80 y.o. 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  Oncology recommended a chemoradiation therapy as a treatment option for the newly diagnosed.  Oncology also recommended a G-tube placement due to anticipated severe mucositis during chemoradiation therapy which may cause severe malnutrition. After thorough discussion and shared decision making, patient decided to undergo the chemoradiation therapy and G-tube placement. IR was requested for image guided  Port-A-Cath gastrostomy  tube placement. Patient presents to IR today for the procedures. N.p.o. since midnight Last aspirin 81 mg on Tuesday May 17th. INR 1.0 CBC with mild anemia but stable BMP stable   Risks and benefits of image guided port-a-catheter placement was discussed with the patient including, but not limited to bleeding, infection, pneumothorax, or fibrin sheath development and need for additional procedures.  Risks and benefits image guided gastrostomy tube placement was discussed with the patient including, but not limited to the need for a barium enema during the procedure, bleeding,  infection, peritonitis and/or damage to adjacent structures.  All of the patient's questions were answered, patient is agreeable to proceed.  Consent signed and in chart.   Thank you for this interesting consult.  I greatly enjoyed meeting Jeffrey [name] and look forward to participating in their care.  A copy of this report was sent to the requesting provider on this date.  Electronically Signed: Tera Mater, PA-C 11/06/2020, 8:29 AM   I spent a total of  30 Minutes   in face to face in clinical consultation, greater than 50% of which was counseling/coordinating care for Port-A-Cath and G-tube placement.

## 2020-11-06 NOTE — Progress Notes (Signed)
Pharmacist Chemotherapy Monitoring - Initial Assessment    Anticipated start date: 11/10/20   Regimen:  . Are orders appropriate based on the patient's diagnosis, regimen, and cycle? Yes . Does the plan date match the patient's scheduled date? Yes . Is the sequencing of drugs appropriate? Yes . Are the premedications appropriate for the patient's regimen? Yes . Prior Authorization for treatment is: Approved o If applicable, is the correct biosimilar selected based on the patient's insurance? not applicable  Organ Function and Labs: Marland Kitchen Are dose adjustments needed based on the patient's renal function, hepatic function, or hematologic function? No . Are appropriate labs ordered prior to the start of patient's treatment? Yes . Other organ system assessment, if indicated: N/A . The following baseline labs, if indicated, have been ordered: cisplatin: K, Mg  Dose Assessment: . Are the drug doses appropriate? Yes . Are the following correct: o Drug concentrations Yes o IV fluid compatible with drug Yes o Administration routes Yes o Timing of therapy Yes . If applicable, does the patient have documented access for treatment and/or plans for port-a-cath placement? yes . If applicable, have lifetime cumulative doses been properly documented and assessed? yes Lifetime Dose Tracking  No doses have been documented on this patient for the following tracked chemicals: Doxorubicin, Epirubicin, Idarubicin, Daunorubicin, Mitoxantrone, Bleomycin, Oxaliplatin, Carboplatin, Liposomal Doxorubicin  o   Toxicity Monitoring/Prevention: . The patient has the following take home antiemetics prescribed: Ondansetron, Prochlorperazine and Lorazepam . The patient has the following take home medications prescribed: N/A . Medication allergies and previous infusion related reactions, if applicable, have been reviewed and addressed. Yes . The patient's current medication list has been assessed for drug-drug  interactions with their chemotherapy regimen. no significant drug-drug interactions were identified on review.  Order Review: . Are the treatment plan orders signed? No . Is the patient scheduled to see a provider prior to their treatment? Yes  I verify that I have reviewed each item in the above checklist and answered each question accordingly.   Kennith Center, Pharm.D., CPP 11/06/2020@12 :17 PM

## 2020-11-06 NOTE — Progress Notes (Addendum)
Discharge instructions reviewed with pt and his son both voice understanding. Pt states he had instructions for his tube at [facility].

## 2020-11-06 NOTE — Progress Notes (Signed)
G tube place to suction as ordered

## 2020-11-06 NOTE — Discharge Instructions (Addendum)
Implanted Port Insertion, Care After This sheet gives you information about how to care for yourself after your procedure. Your health care provider may also give you more specific instructions. If you have problems or questions, contact your health care provider. What can I expect after the procedure? After the procedure, it is common to have:  Discomfort at the port insertion site.  Bruising on the skin over the port. This should improve over 3-4 days. Follow these instructions at home: Port care  After your port is placed, you will get a manufacturer's information card. The card has information about your port. Keep this card with you at all times.  Take care of the port as told by your health care provider. Ask your health care provider if you or a family member can get training for taking care of the port at home. A home health care nurse may also take care of the port.  Make sure to remember what type of port you have. Incision care  Follow instructions from your health care provider about how to take care of your port insertion site. Make sure you: ? Wash your hands with soap and water before and after you change your bandage (dressing). If soap and water are not available, use hand sanitizer. ? Change your dressing as told by your health care provider. ? Leave stitches (sutures), skin glue, or adhesive strips in place. These skin closures may need to stay in place for 2 weeks or longer. If adhesive strip edges start to loosen and curl up, you may trim the loose edges. Do not remove adhesive strips completely unless your health care provider tells you to do that.  Check your port insertion site every day for signs of infection. Check for: ? Redness, swelling, or pain. ? Fluid or blood. ? Warmth. ? Pus or a bad smell.      Activity  Return to your normal activities as told by your health care provider. Ask your health care provider what activities are safe for you.  Do not  lift anything that is heavier than 10 lb (4.5 kg), or the limit that you are told, until your health care provider says that it is safe. General instructions  Take over-the-counter and prescription medicines only as told by your health care provider.  Do not take baths, swim, or use a hot tub until your health care provider approves. Ask your health care provider if you may take showers. You may only be allowed to take sponge baths.  Do not drive for 24 hours if you were given a sedative during your procedure.  Wear a medical alert bracelet in case of an emergency. This will tell any health care providers that you have a port.  Keep all follow-up visits as told by your health care provider. This is important. Contact a health care provider if:  You cannot flush your port with saline as directed, or you cannot draw blood from the port.  You have a fever or chills.  You have redness, swelling, or pain around your port insertion site.  You have fluid or blood coming from your port insertion site.  Your port insertion site feels warm to the touch.  You have pus or a bad smell coming from the port insertion site. Get help right away if:  You have chest pain or shortness of breath.  You have bleeding from your port that you cannot control. Summary  Take care of the port as told by your   health care provider. Keep the manufacturer's information card with you at all times.  Change your dressing as told by your health care provider.  Contact a health care provider if you have a fever or chills or if you have redness, swelling, or pain around your port insertion site.  Keep all follow-up visits as told by your health care provider. This information is not intended to replace advice given to you by your health care provider. Make sure you discuss any questions you have with your health care provider. Document Revised: 01/02/2018 Document Reviewed: 01/02/2018 Elsevier Patient Education   [place]. Gastrostomy Tube Home Guide, Adult A gastrostomy tube, or G-tube, is a tube that is inserted through the abdomen into the stomach. The tube is used to give feedings and medicines when a person cannot eat and drink enough on his or her own or take medicines by mouth. How to care for the insertion site Supplies needed:  Saline solution or clean, warm water and soap. Saline solution is made of salt and water.  Cotton swab or gauze.  Pre-cut gauze bandage (dressing) and tape, if needed. Instructions Follow these steps daily to clean the insertion site: 1. Wash your hands with soap and water for at least 20 seconds. 2. Remove the dressing (if there is one) that is between the person's skin and the tube. 3. Check the area where the tube enters the skin. Check daily for problems such as: ? Redness, rash, or irritation. ? Swelling. ? Pus-like drainage. ? Extra skin growth. 4. Moisten the cotton swab or gauze with the saline solution or with a soap-and-water mixture. Gently clean around the insertion site. Remove any drainage or crusted material. ? When the G-tube is first put in, a normal saline solution or water can be used to clean the skin. ? After the skin around the tube has healed, mild soap and water may be used. 5. Apply a dressing (if there should be one) between the person's skin and the tube.   How to flush a G-tube Flush the G-tube regularly to keep it from clogging. Flush it before and after feedings and as often as told by the health care provider. Supplies needed:  Purified or germ-free (sterile) water, warmed.  Container with lid for boiling water, if needed.  60 cc G-tube syringe. Instructions Before you begin, decide whether to use sterile water or purified drinking water.  Use only sterile water if: ? The person has a weak disease-fighting (immune) system. ? The person has trouble fighting off infections (is immunocompromised). ? You are unsure  about the amount of chemical contaminants in purified or drinking water.  Use purified drinking water in all other cases. To purify drinking water by boiling: ? Boil water for at least 1 minute. Keep lid over water while it boils. ? Let water cool to room temperature before using. Follow these steps to flush the G-tube: 1. Wash your hands with soap and water for at least 20 seconds. 2. Bring out (draw up) 30 mL of warm water in a syringe. 3. Connect the syringe to the tube. 4. Slowly and gently push the water into the tube. General tips  If the tube comes out: ? Cover the opening with a clean dressing and tape. ? Get help right away.  If there is skin or scar tissue growing where the tube enters the skin: ? Keep the area clean and dry. ? Secure the tube with tape so that the tube does  not move around too much.  If the tube gets clogged: ? Slowly push warm water into the tube with a large syringe. ? Do not force the fluid into the tube or push an object into the tube. ? Get help right away if you cannot unclog the tube. Follow these instructions at home: Feedings  Give feedings at room temperature.  If feedings are continuous: ? Do not put more than 4 hours' worth of feedings in the feeding bag. ? Stop the feedings when you need to give medicine or flush the tube. Be sure to restart the feedings. ? Make sure the person's head is above his or her stomach (upright position). This will prevent choking and discomfort.  Make sure the person is in the right position during and after feedings. ? During feedings, have the person in the upright position. ? After a non-continuous feeding (bolus feeding), have the person stay in the upright position for 1 hour.  Cover and place unused feedings in the refrigerator.  Replace feeding bags and syringes as told. Good hygiene  Make sure the person takes good care of his or her mouth and teeth (oral hygiene), such as by brushing his or her  teeth.  Keep the area where the tube enters the skin clean and dry. General instructions  Use syringes made only for G-tubes.  Do not pull or put tension on the tube.  Before you remove the tube cap or disconnect a syringe, close the tube by using a clamp (clamping) or bending (kinking) the tube.  Measure the length of the G-tube every day from the insertion site to the end of the tube.  If the person's G-tube has a balloon, check the fluid in the balloon every week. Check the manufacturer's specifications to find the amount of fluid that should be in the balloon.  Remove excess air from the G-tube as told. This is called venting.  Do not push feedings, medicines, or flushes fast. Contact a health care provider if:  The person with the tube has constipation or a fever.  A large amount of fluid or mucus-like liquid is leaking from the tube.  Skin or scar tissue appears to be growing where the tube enters the skin.  The length of tube from the insertion site to the G-tube gets longer. Get help right away if:  The person with the tube has any of these problems: ? Severe pain, tenderness, or bloating in the abdomen. ? Nausea or vomiting. ? Trouble breathing or shortness of breath.  Any of these problems happen in the area where the tube enters the skin: ? Redness, irritation, swelling, or soreness. ? Pus-like discharge. ? A bad smell.  The tube is clogged and cannot be flushed.  The tube comes out. The tube will need to put back in within 4 hours. Summary  A gastrostomy tube, or G-tube, is a tube that is inserted through the abdomen into the stomach. The tube is used to give feedings and medicines when a person cannot eat and drink enough on his or her own or cannot take medicine by mouth.  Check and clean the insertion site daily as told by the person's health care provider.  Flush the G-tube regularly to keep it from clogging. Flush it before and after feedings and as  often as told.  Keep the area where the tube enters the skin clean and dry. This information is not intended to replace advice given to you by your health care  provider. Make sure you discuss any questions you have with your health care provider. Document Revised: 10/21/2019 Document Reviewed: 10/24/2019 Elsevier Patient Education  2021 [place]. Moderate Conscious Sedation, Adult Sedation is the use of medicines to promote relaxation and to relieve discomfort and anxiety. Moderate conscious sedation is a type of sedation. Under moderate conscious sedation, you are less alert than normal, but you are still able to respond to instructions, touch, or both. Moderate conscious sedation is used during short medical and dental procedures. It is milder than deep sedation, which is a type of sedation under which you cannot be easily woken up. It is also milder than general anesthesia, which is the use of medicines to make you unconscious. Moderate conscious sedation allows you to return to your regular activities sooner. Tell a health care provider about:  Any allergies you have.  All medicines you are taking, including vitamins, herbs, eye drops, creams, and over-the-counter medicines.  Any use of steroids. This includes steroids taken by mouth or as a cream.  Any problems you or family members have had with sedatives and anesthetic medicines.  Any blood disorders you have.  Any surgeries you have had.  Any medical conditions you have, such as sleep apnea.  Whether you are pregnant or may be pregnant.  Any use of cigarettes, alcohol, marijuana, or drugs. What are the risks? Generally, this is a safe procedure. However, problems may occur, including:  Getting too much medicine (oversedation).  Nausea.  Allergic reaction to medicines.  Trouble breathing. If this happens, a breathing tube may be used. It will be removed when you are awake and breathing on your own.  Heart  trouble.  Lung trouble.  Confusion that gets better with time (emergence delirium). What happens before the procedure? Staying hydrated Follow instructions from your health care provider about hydration, which may include:  Up to 2 hours before the procedure - you may continue to drink clear liquids, such as water, clear fruit juice, black coffee, and plain tea. Eating and drinking restrictions Follow instructions from your health care provider about eating and drinking, which may include:  8 hours before the procedure - stop eating heavy meals or foods, such as meat, fried foods, or fatty foods.  6 hours before the procedure - stop eating light meals or foods, such as toast or cereal.  6 hours before the procedure - stop drinking milk or drinks that contain milk.  2 hours before the procedure - stop drinking clear liquids. Medicines Ask your health care provider about:  Changing or stopping your regular medicines. This is especially important if you are taking diabetes medicines or blood thinners.  Taking medicines such as aspirin and ibuprofen. These medicines can thin your blood. Do not take these medicines unless your health care provider tells you to take them.  Taking over-the-counter medicines, vitamins, herbs, and supplements. Tests and exams  You will have a physical exam.  You may have blood tests done to show how well: ? Your kidneys and liver work. ? Your blood clots. General instructions  Plan to have a responsible adult take you home from the hospital or clinic.  If you will be going home right after the procedure, plan to have a responsible adult care for you for the time you are told. This is important. What happens during the procedure?  You will be given the sedative. The sedative may be given: ? As a pill that you will swallow. It can also be inserted  into the rectum. ? As a spray through the nose. ? As an injection into the muscle. ? As an injection  into the vein through an IV.  You may be given oxygen as needed.  Your breathing, heart rate, and blood pressure will be monitored during the procedure.  The medical or dental procedure will be done. The procedure may vary among health care providers and hospitals.   What happens after the procedure?  Your blood pressure, heart rate, breathing rate, and blood oxygen level will be monitored until you leave the hospital or clinic.  You will get fluids through your IV if needed.  Do not drive or operate machinery until your health care provider says that it is safe. Summary  Sedation is the use of medicines to promote relaxation and to relieve discomfort and anxiety. Moderate conscious sedation is a type of sedation that is used during short medical and dental procedures.  Tell the health care provider about any medical conditions that you have and about all the medicines that you are taking.  You will be given the sedative as a pill, a spray through the nose, an injection into the muscle, or an injection into the vein through an IV. Vital signs are monitored during the sedation.  Moderate conscious sedation allows you to return to your regular activities sooner. This information is not intended to replace advice given to you by your health care provider. Make sure you discuss any questions you have with your health care provider. Document Revised: 10/04/2019 Document Reviewed: 05/02/2019 Elsevier Patient Education  2021 [employer].

## 2020-11-06 NOTE — Discharge Instructions (Signed)
Gastrostomy Tube Replacement A gastrostomy tube replacement is a procedure to change the gastrostomy tube, or G-tube. This is the tube that goes into the stomach. This may be a planned procedure, or it may be an emergency procedure if the tube has come out or has stopped working. Tell a health care provider about:  Any allergies you have.  All medicines you are taking, including vitamins, herbs, eye drops, creams, and over-the-counter medicines.  Any problems you or family members have had with anesthetic medicines.  Any blood disorders you have.  Any surgeries you have had.  Any medical conditions you have.  Whether you are pregnant or may be pregnant. What are the risks? Generally, this is a safe procedure. However, problems may occur, including:  Bleeding.  Infection.  A lot of leaking of fluid around the tube. What happens before the procedure?  Let your health care provider know how long you have had your G-tube. If your tube has been in place for less than 2 weeks, you may need another surgical placement procedure, not just a replacement.  If your tube has come out at home, bring the tube with you so your health care provider can see the type of tube you are using.  Ask your health care provider what steps will be taken to prevent infection. These steps may include: ? Removing hair at the surgery site. ? Washing skin with a germ-killing soap. ? Taking antibiotic medicine. What happens during the procedure?  You may be given a medicine to numb the insertion area (local anesthetic).  Your G-tube will be removed if it is partially displaced or if it is in place but not working.  A syringe may be used to deflate the inflatable balloon at the end of your G-tube to hold it in place, if you have an inflatable balloon.  Pressure may be applied to your abdomen as the tube is pulled out. Then, the opening of your gastrostomy may be gently probed to check it.  The opening for  the G-tube may be lubricated with ointment.  A new tube will be placed through the opening to replace the old one. If the new tube does not go in easily, you may have a smaller tube put in.  If the new tube has an inflatable balloon, the balloon at the end of the tube will be inflated with a syringe to hold it in place.  The new tube will be secured in place. The procedure may vary among health care providers and hospitals.   What happens after the procedure? You may have an X-ray to make sure that the new G-tube is in the right place and is working well. To do this:  Your health care provider will put a liquid that shows up on X-rays through the G-tube.  An X-ray image will be taken to make sure that the fluid is not leaking outside of your stomach. Summary  A gastrostomy tube replacement is a procedure to change the tube that goes into your stomach.  Generally, this is a safe procedure. However, problems may occur, including bleeding, infection, and a lot of leaking of the fluid around the tube.  Your health care provider will carefully replace the old tube with a new one and secure the new tube in place.  After the procedure, you may have an X-ray to make sure that the new tube is in the right place and is working well. This information is not intended to replace advice  given to you by your health care provider. Make sure you discuss any questions you have with your health care provider. Document Revised: 10/24/2019 Document Reviewed: 10/24/2019 Elsevier Patient Education  [place]. Gastrostomy Tube Home Guide, Adult A gastrostomy tube, or G-tube, is a tube that is inserted through the abdomen into the stomach. The tube is used to give feedings and medicines when a person cannot eat and drink enough on his or her own or take medicines by mouth. How to care for the insertion site Supplies needed:  Saline solution or clean, warm water and soap. Saline solution is made of  salt and water.  Cotton swab or gauze.  Pre-cut gauze bandage (dressing) and tape, if needed. Instructions Follow these steps daily to clean the insertion site: 1. Wash your hands with soap and water for at least 20 seconds. 2. Remove the dressing (if there is one) that is between the person's skin and the tube. 3. Check the area where the tube enters the skin. Check daily for problems such as: ? Redness, rash, or irritation. ? Swelling. ? Pus-like drainage. ? Extra skin growth. 4. Moisten the cotton swab or gauze with the saline solution or with a soap-and-water mixture. Gently clean around the insertion site. Remove any drainage or crusted material. ? When the G-tube is first put in, a normal saline solution or water can be used to clean the skin. ? After the skin around the tube has healed, mild soap and water may be used. 5. Apply a dressing (if there should be one) between the person's skin and the tube.   How to flush a G-tube Flush the G-tube regularly to keep it from clogging. Flush it before and after feedings and as often as told by the health care provider. Supplies needed:  Purified or germ-free (sterile) water, warmed.  Container with lid for boiling water, if needed.  60 cc G-tube syringe. Instructions Before you begin, decide whether to use sterile water or purified drinking water.  Use only sterile water if: ? The person has a weak disease-fighting (immune) system. ? The person has trouble fighting off infections (is immunocompromised). ? You are unsure about the amount of chemical contaminants in purified or drinking water.  Use purified drinking water in all other cases. To purify drinking water by boiling: ? Boil water for at least 1 minute. Keep lid over water while it boils. ? Let water cool to room temperature before using. Follow these steps to flush the G-tube: 1. Wash your hands with soap and water for at least 20 seconds. 2. Bring out (draw up) 30 mL  of warm water in a syringe. 3. Connect the syringe to the tube. 4. Slowly and gently push the water into the tube. General tips  If the tube comes out: ? Cover the opening with a clean dressing and tape. ? Get help right away.  If there is skin or scar tissue growing where the tube enters the skin: ? Keep the area clean and dry. ? Secure the tube with tape so that the tube does not move around too much.  If the tube gets clogged: ? Slowly push warm water into the tube with a large syringe. ? Do not force the fluid into the tube or push an object into the tube. ? Get help right away if you cannot unclog the tube. Follow these instructions at home: Feedings  Give feedings at room temperature.  If feedings are continuous: ? Do not  put more than 4 hours' worth of feedings in the feeding bag. ? Stop the feedings when you need to give medicine or flush the tube. Be sure to restart the feedings. ? Make sure the person's head is above his or her stomach (upright position). This will prevent choking and discomfort.  Make sure the person is in the right position during and after feedings. ? During feedings, have the person in the upright position. ? After a non-continuous feeding (bolus feeding), have the person stay in the upright position for 1 hour.  Cover and place unused feedings in the refrigerator.  Replace feeding bags and syringes as told. Good hygiene  Make sure the person takes good care of his or her mouth and teeth (oral hygiene), such as by brushing his or her teeth.  Keep the area where the tube enters the skin clean and dry. General instructions  Use syringes made only for G-tubes.  Do not pull or put tension on the tube.  Before you remove the tube cap or disconnect a syringe, close the tube by using a clamp (clamping) or bending (kinking) the tube.  Measure the length of the G-tube every day from the insertion site to the end of the tube.  If the person's  G-tube has a balloon, check the fluid in the balloon every week. Check the manufacturer's specifications to find the amount of fluid that should be in the balloon.  Remove excess air from the G-tube as told. This is called venting.  Do not push feedings, medicines, or flushes fast. Contact a health care provider if:  The person with the tube has constipation or a fever.  A large amount of fluid or mucus-like liquid is leaking from the tube.  Skin or scar tissue appears to be growing where the tube enters the skin.  The length of tube from the insertion site to the G-tube gets longer. Get help right away if:  The person with the tube has any of these problems: ? Severe pain, tenderness, or bloating in the abdomen. ? Nausea or vomiting. ? Trouble breathing or shortness of breath.  Any of these problems happen in the area where the tube enters the skin: ? Redness, irritation, swelling, or soreness. ? Pus-like discharge. ? A bad smell.  The tube is clogged and cannot be flushed.  The tube comes out. The tube will need to put back in within 4 hours. Summary  A gastrostomy tube, or G-tube, is a tube that is inserted through the abdomen into the stomach. The tube is used to give feedings and medicines when a person cannot eat and drink enough on his or her own or cannot take medicine by mouth.  Check and clean the insertion site daily as told by the person's health care provider.  Flush the G-tube regularly to keep it from clogging. Flush it before and after feedings and as often as told.  Keep the area where the tube enters the skin clean and dry. This information is not intended to replace advice given to you by your health care provider. Make sure you discuss any questions you have with your health care provider. Document Revised: 10/21/2019 Document Reviewed: 10/24/2019 Elsevier Patient Education  2021 [place] Insertion, Care After This sheet gives you  information about how to care for yourself after your procedure. Your health care provider may also give you more specific instructions. If you have problems or questions, contact your health care provider. What can  I expect after the procedure? After the procedure, it is common to have:  Discomfort at the port insertion site.  Bruising on the skin over the port. This should improve over 3-4 days. Follow these instructions at home: [facility] care  After your port is placed, you will get a manufacturer's information card. The card has information about your port. Keep this card with you at all times.  Take care of the port as told by your health care provider. Ask your health care provider if you or a family member can get training for taking care of the port at home. A home health care nurse may also take care of the port.  Make sure to remember what type of port you have. Incision care  Follow instructions from your health care provider about how to take care of your port insertion site. Make sure you: ? Wash your hands with soap and water before and after you change your bandage (dressing). If soap and water are not available, use hand sanitizer. ? Change your dressing as told by your health care provider. ? Leave stitches (sutures), skin glue, or adhesive strips in place. These skin closures may need to stay in place for 2 weeks or longer. If adhesive strip edges start to loosen and curl up, you may trim the loose edges. Do not remove adhesive strips completely unless your health care provider tells you to do that.  Check your port insertion site every day for signs of infection. Check for: ? Redness, swelling, or pain. ? Fluid or blood. ? Warmth. ? Pus or a bad smell.      Activity  Return to your normal activities as told by your health care provider. Ask your health care provider what activities are safe for you.  Do not lift anything that is heavier than 10 lb (4.5 kg), or the  limit that you are told, until your health care provider says that it is safe. General instructions  Take over-the-counter and prescription medicines only as told by your health care provider.  Do not take baths, swim, or use a hot tub until your health care provider approves. Ask your health care provider if you may take showers. You may only be allowed to take sponge baths.  Do not drive for 24 hours if you were given a sedative during your procedure.  Wear a medical alert bracelet in case of an emergency. This will tell any health care providers that you have a port.  Keep all follow-up visits as told by your health care provider. This is important. Contact a health care provider if:  You cannot flush your port with saline as directed, or you cannot draw blood from the port.  You have a fever or chills.  You have redness, swelling, or pain around your port insertion site.  You have fluid or blood coming from your port insertion site.  Your port insertion site feels warm to the touch.  You have pus or a bad smell coming from the port insertion site. Get help right away if:  You have chest pain or shortness of breath.  You have bleeding from your port that you cannot control. Summary  Take care of the port as told by your health care provider. Keep the manufacturer's information card with you at all times.  Change your dressing as told by your health care provider.  Contact a health care provider if you have a fever or chills or if you have redness, swelling,  or pain around your port insertion site.  Keep all follow-up visits as told by your health care provider. This information is not intended to replace advice given to you by your health care provider. Make sure you discuss any questions you have with your health care provider. Document Revised: 01/02/2018 Document Reviewed: 01/02/2018 Elsevier Patient Education  [place].

## 2020-11-06 NOTE — Procedures (Signed)
Interventional Radiology Procedure Note  Procedure: RT IJ POWER PORT    Complications: None  Estimated Blood Loss:  MIN  Findings: TIP SVCRA    M. TREVOR Karinna Beadles, MD    

## 2020-11-06 NOTE — Progress Notes (Signed)
G tube disconnected from suction. As ordered. Pt states he has an abd binder at home

## 2020-11-09 ENCOUNTER — Other Ambulatory Visit: Payer: Medicare Other

## 2020-11-09 ENCOUNTER — Inpatient Hospital Stay: Payer: Medicare Other

## 2020-11-09 ENCOUNTER — Encounter: Payer: Self-pay | Admitting: Hematology and Oncology

## 2020-11-09 ENCOUNTER — Inpatient Hospital Stay: Payer: Medicare Other | Admitting: Hematology and Oncology

## 2020-11-09 ENCOUNTER — Other Ambulatory Visit: Payer: Self-pay | Admitting: Hematology and Oncology

## 2020-11-09 ENCOUNTER — Ambulatory Visit
Admission: RE | Admit: 2020-11-09 | Discharge: 2020-11-09 | Disposition: A | Discharge status: Home / Self Care | Payer: Medicare Other | Source: Ambulatory Visit | Attending: Radiation Oncology | Admitting: Radiation Oncology

## 2020-11-09 ENCOUNTER — Other Ambulatory Visit: Payer: Self-pay

## 2020-11-09 ENCOUNTER — Telehealth: Payer: Self-pay

## 2020-11-09 VITALS — BP 162/84 | HR 68 | Temp 97.9°F | Resp 18 | Ht 70.0 in | Wt 198.8 lb

## 2020-11-09 DIAGNOSIS — C01 Malignant neoplasm of base of tongue: Secondary | ICD-10-CM | POA: Diagnosis not present

## 2020-11-09 DIAGNOSIS — C109 Malignant neoplasm of oropharynx, unspecified: Secondary | ICD-10-CM

## 2020-11-09 DIAGNOSIS — Z800 Family history of malignant neoplasm of digestive organs: Secondary | ICD-10-CM | POA: Diagnosis not present

## 2020-11-09 DIAGNOSIS — Z95828 Presence of other vascular implants and grafts: Secondary | ICD-10-CM

## 2020-11-09 DIAGNOSIS — Z87891 Personal history of nicotine dependence: Secondary | ICD-10-CM | POA: Diagnosis not present

## 2020-11-09 DIAGNOSIS — Z889 Allergy status to unspecified drugs, medicaments and biological substances status: Secondary | ICD-10-CM | POA: Insufficient documentation

## 2020-11-09 DIAGNOSIS — Z510 Encounter for antineoplastic radiation therapy: Secondary | ICD-10-CM | POA: Diagnosis not present

## 2020-11-09 DIAGNOSIS — Z5111 Encounter for antineoplastic chemotherapy: Secondary | ICD-10-CM | POA: Diagnosis not present

## 2020-11-09 LAB — CBC WITH DIFFERENTIAL (CANCER CENTER ONLY)
Abs Immature Granulocytes: 0.08 10*3/uL — ABNORMAL HIGH (ref 0.00–0.07)
Basophils Absolute: 0 10*3/uL (ref 0.0–0.1)
Basophils Relative: 0 %
Eosinophils Absolute: 0.1 10*3/uL (ref 0.0–0.5)
Eosinophils Relative: 1 %
HCT: 30.5 % — ABNORMAL LOW (ref 39.0–52.0)
Hemoglobin: 10.6 g/dL — ABNORMAL LOW (ref 13.0–17.0)
Immature Granulocytes: 1 %
Lymphocytes Relative: 15 %
Lymphs Abs: 1.4 10*3/uL (ref 0.7–4.0)
MCH: 32.5 pg (ref 26.0–34.0)
MCHC: 34.8 g/dL (ref 30.0–36.0)
MCV: 93.6 fL (ref 80.0–100.0)
Monocytes Absolute: 1.5 10*3/uL — ABNORMAL HIGH (ref 0.1–1.0)
Monocytes Relative: 16 %
Neutro Abs: 6.4 10*3/uL (ref 1.7–7.7)
Neutrophils Relative %: 67 %
Platelet Count: 146 10*3/uL — ABNORMAL LOW (ref 150–400)
RBC: 3.26 MIL/uL — ABNORMAL LOW (ref 4.22–5.81)
RDW: 13.4 % (ref 11.5–15.5)
WBC Count: 9.5 10*3/uL (ref 4.0–10.5)
nRBC: 0 % (ref 0.0–0.2)

## 2020-11-09 LAB — BASIC METABOLIC PANEL - CANCER CENTER ONLY
Anion gap: 8 (ref 5–15)
BUN: 19 mg/dL (ref 8–23)
CO2: 24 mmol/L (ref 22–32)
Calcium: 9.4 mg/dL (ref 8.9–10.3)
Chloride: 100 mmol/L (ref 98–111)
Creatinine: 1.04 mg/dL (ref 0.61–1.24)
GFR, Estimated: >60 mL/min (ref >60)
Glucose, Bld: 97 mg/dL (ref 70–99)
Potassium: 4.4 mmol/L (ref 3.5–5.1)
Sodium: 132 mmol/L — ABNORMAL LOW (ref 135–145)

## 2020-11-09 LAB — MAGNESIUM: Magnesium: 1.9 mg/dL (ref 1.7–2.4)

## 2020-11-09 MED ORDER — SODIUM CHLORIDE 0.9% FLUSH
10.0000 mL | Freq: Once | INTRAVENOUS | Status: AC
  Administered 2020-11-09: 10 mL
  Filled 2020-11-09: qty 10

## 2020-11-09 MED ORDER — HEPARIN SOD (PORK) LOCK FLUSH 100 UNIT/ML IV SOLN
500.0000 [IU] | Freq: Once | INTRAVENOUS | Status: AC
  Administered 2020-11-09: 500 [IU]
  Filled 2020-11-09: qty 5

## 2020-11-09 MED ORDER — DEXAMETHASONE 4 MG PO TABS: 4.0000 mg | ORAL_TABLET | Freq: Two times a day (BID) | ORAL | 6 refills | Status: AC

## 2020-11-09 MED ORDER — SONAFINE EX EMUL
1.0000 "application " | Freq: Two times a day (BID) | CUTANEOUS | Status: DC
  Administered 2020-11-09: 1 via TOPICAL

## 2020-11-09 NOTE — Progress Notes (Signed)
Oncology Nurse Navigator Documentation  To provide support, encouragement and care continuity, met with Mr. Jeffrey [name] for his initial RT.  He was accompanied by his son, Jeffrey [name].   I reviewed the 2-step treatment process, answered questions.   Mr. Jeffrey [name] completed treatment without difficulty, denied questions/concerns.  I reviewed the registration/arrival procedure for subsequent treatments.  I answered questions that Mr. Jeffrey [name] had regarding the PEG that was placed on 5/20. I reinforced previous education provided to him regarding how to flush the PEG and change the dressing daily.   I encouraged them to call me with questions/concerns as tmts proceed.   Harlow Asa RN, BSN, OCN Head & Neck Oncology Nurse [place] at [facility] Phone # [phone]  Fax # [phone]

## 2020-11-09 NOTE — Assessment & Plan Note (Signed)
Patient mentioned that in the past he had allergy which was described as hives to triamcinolone couple times.  But in record review it appears that he has tolerated IV dexamethasone well based on pharmacy records.  We will attempt a trial of dexamethasone for antinausea after cisplatin.  If he does appear to have any allergies, he can discontinue the medication and call us immediately or go to the nearest hospital.

## 2020-11-09 NOTE — Telephone Encounter (Signed)
Spoke with patient regarding medications. Told him that I had spoken with his pharmacy; his insurance only allows for a 3 day supply of zofran following each treatment, and that should be available this afternoon. He will have to refill it after each chemotherapy session. Also informed him that Dr Chryl Heck had called in dexamethasone and that should be available this afternoon as well. [facility] faxed information to receive prior auth for EMLA cream and ativan. Will follow-up.

## 2020-11-09 NOTE — Assessment & Plan Note (Signed)
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 Patient is to start his radiation today 11/09/2020.  He will start his cycle 1 day 1 of cisplatin 11/10/2020. No concerning review of systems since his last visit except for some pain around the G-tube. Physical examination again tired appearing male patient without any concerning physical examination findings, some erythema noted around the G-tube, we will continue to monitor. We have reviewed labs from today, satisfactory to proceed.  He does have mild anemia and mild thrombocytopenia and will continue to monitor this. We have again discussed some adverse effects from chemotherapy to monitor, encourage hydration and to return to clinic in 1 week.   All his appointments have been made.

## 2020-11-09 NOTE — Progress Notes (Signed)
dexa

## 2020-11-09 NOTE — Progress Notes (Signed)

## 2020-11-09 NOTE — Progress Notes (Signed)
[facility] NOTE  Patient Care Team: Vivi Barrack, MD as PCP - General (Family Medicine) Buford Dresser, MD as PCP - Cardiology (Cardiology) Chevis Pretty as Consulting Physician (Dentistry) [name], MD as Consulting Physician (Vascular Surgery) [name], [name], PA-C as Physician Assistant (Dermatology) Malmfelt, Stephani Police, RN as Oncology Nurse Navigator Eppie Gibson, MD as Consulting Physician (Radiation Oncology) Leta Baptist, MD as Consulting Physician (Otolaryngology)  CHIEF COMPLAINTS/PURPOSE OF CONSULTATION:  Oropharyngeal cancer.  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 Patient is to start his radiation today 11/09/2020.  He will start his cycle 1 day 1 of cisplatin 11/10/2020. No concerning review of systems since his last visit except for some pain around the G-tube. Physical examination again tired appearing male patient without any concerning physical examination findings, some erythema noted around the G-tube, we will continue to monitor. We have reviewed labs from today, satisfactory to proceed.  He does have mild anemia and mild thrombocytopenia and will continue to monitor this. We have again discussed some adverse effects from chemotherapy to monitor, encourage hydration and to return to clinic in 1 week.   All his appointments have been made.  History of drug allergy Patient mentioned that in the past he had allergy which was described as hives to triamcinolone couple times.  But in record review it appears that he has tolerated IV dexamethasone well based on pharmacy records.  We will attempt a trial of dexamethasone for antinausea after cisplatin.  If he does appear to have any allergies, he can discontinue the medication and call us  immediately or go to the nearest hospital.  No orders of the defined types were placed in this encounter.   HISTORY OF PRESENTING ILLNESS:   Jessup Arcila 80 y.o. male is here because of new diagnosis of oropharyngeal cancer.   Oncology History Overview Note  Patient first noticed to have neck mass about 2 months prior to presentation. He denies any complaints except for neck mass, no difficulty swallowing, pain at the base of the tongue. He went to see his PCP who recommended an [place] which demonstrated hypoechoic mass at the right level II a highly concerning for an enlarged pathologic LN. He was referred to ENT and CT neck was ordered. CT neck showed enlarged right level 2 node, asymmetric soft tissue at the right tongue base, favored to reflect asymmetric tonsillar issue. 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 PET CT showed primary mucosal neoplasm in the right tongue base area demonstrating hypermetabolism.    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HCC)  10/21/2020 Initial Diagnosis   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HCC)   10/21/2020 Cancer Staging   Staging form: Pharynx - HPV-Mediated Oropharynx, AJCC 8th Edition - Clinical stage from 10/21/2020: Stage I (cT1, cN1, cM0, p16+) - Signed by Benay Pike, MD on 10/21/2020 Stage prefix: Initial diagnosis   11/10/2020 -  Chemotherapy    Patient is on Treatment Plan: HEAD/NECK CISPLATIN Q7D       Interval history Patient is here for follow-up prior to cycle 1 of cisplatin with his son. He is understandably very worried about the radiation and chemotherapy tomorrow. He had his G-tube and port placed.  There is some irritation of  the skin and pain around the G-tube.  He also has noticed some brownish gooey discharge from around the G-tube when he was trying to inject some water. No fevers or chills.  Rest of the pertinent review of systems  reviewed and negative.  MEDICAL HISTORY:  Past Medical History:  Diagnosis Date  . AAA (abdominal aortic aneurysm) ([place])   . Allergy   . Arthritis   . Colon polyps   . Coronary artery calcification   . Eczematous dermatitis   . GERD (gastroesophageal reflux disease)   . Hypertension   . Iliac artery stenosis, left ([place])   . 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 Osteopenia     SURGICAL HISTORY: Past Surgical History:  Procedure Laterality Date  . ABDOMINAL AORTIC ENDOVASCULAR FENESTRATED STENT GRAFT N/A 03/21/2018   Procedure: ABDOMINAL AORTIC ENDOVASCULAR FENESTRATED STENT GRAFT ; [place] CT PERFORMED;  Surgeon: [name], MD;  Location: [facility];  Service: Vascular;  Laterality: N/A;  . Basal Skin Cancer  2012  . CATARACT EXTRACTION Bilateral   . IR GASTROSTOMY TUBE MOD SED  11/06/2020  . IR IMAGING GUIDED PORT INSERTION  11/06/2020  . MASS BIOPSY Right 09/25/2020   Procedure: OPEN NECK MASS BIOPSY;  Surgeon: Leta Baptist, MD;  Location: [facility];  Service: ENT;  Laterality: Right;  . TONSILLECTOMY    . TRANSURETHRAL RESECTION OF PROSTATE      SOCIAL HISTORY: Social History   Socioeconomic History  . Marital status: Divorced    Spouse name: Not on file  . Number of children: 1  . Years of education: Not on file  . Highest education level: Not on file  Occupational History  . Occupation: Retired    Fish farm manager: [employer]  Tobacco Use  . Smoking status: Former Smoker    Packs/day: 1.00    Years: 32.00    Pack years: 32.00    Types: Cigarettes    Quit date: 08/17/1990    Years since quitting: 30.2  . Smokeless tobacco: Never Used  Vaping Use  . Vaping Use: Never used  Substance and Sexual Activity  . Alcohol use: Yes    Comment: Occassionally, "when I feel like it"  . Drug use: No  . Sexual activity: Yes    Partners: Female  Other Topics Concern  . Not on file  Social History Narrative  . Not on file    Social Determinants of Health   Financial Resource Strain: Low Risk   . Difficulty of Paying Living Expenses: Not hard at all  Food Insecurity: No Food Insecurity  . Worried About Charity fundraiser in the Last Year: Never true  . Ran Out of Food in the Last Year: Never true  Transportation Needs: No Transportation Needs  . Lack of Transportation (Medical): No  . Lack of Transportation (Non-Medical): No  Physical Activity: Insufficiently Active  . Days of Exercise per Week: 5 days  . Minutes of Exercise per Session: 20 min  Stress: No Stress Concern Present  . Feeling of Stress : Not at all  Social Connections: Moderately Isolated  . Frequency of Communication with Friends and Family: Once a week  . Frequency of Social Gatherings with Friends and Family: Twice a week  . Attends Religious Services: Never  . Active Member of Clubs or Organizations: Yes  . Attends Archivist Meetings: 1 to 4 times per year  . Marital Status: Divorced  Human resources officer  Violence: Not At Risk  . Fear of Current or Ex-Partner: No  . Emotionally Abused: No  . Physically Abused: No  . Sexually Abused: No    FAMILY HISTORY: Family History  Problem Relation Age of Onset  . Diabetes Mother   . Hypertension Mother   . Stroke Mother   . Alcohol abuse Father   . Heart attack Father   . Heart disease Father   . Hypertension Father   . Cancer Sister   . Hyperlipidemia Sister   . Pancreatic cancer Sister   . Pancreatic cancer Brother   . Hypertension Son     ALLERGIES:  is allergic to ketotifen fumarate, mixed ragweed, and triamcinolone.  MEDICATIONS:  Current Outpatient Medications  Medication Sig Dispense Refill  . acetaminophen (TYLENOL) 650 MG CR tablet Take 1,300 mg by mouth every 8 (eight) hours as needed for pain.    Marland Kitchen amLODipine (NORVASC) 5 MG tablet Take 1 tablet (5 mg total) by mouth daily. 90 tablet 3  . ARTIFICIAL TEAR SOLUTION OP Place 1 drop into both eyes daily as  needed (dry eyes).    . Ascorbic Acid (VITAMIN C) 1000 MG tablet Take 1,000 mg by mouth daily.     Marland Kitchen aspirin EC 81 MG tablet Take 81 mg by mouth daily. Swallow whole.    Marland Kitchen atorvastatin (LIPITOR) 40 MG tablet TAKE 1 TABLET(40 MG) BY MOUTH DAILY AT 6 PM (Patient taking differently: Take 40 mg by mouth daily at 6 PM.) 90 tablet 1  . Calcium Carbonate-Vitamin D 600-200 MG-UNIT TABS Take 1 tablet by mouth every other day.     . Cyanocobalamin 1000 MCG TBCR Take 1,000 mcg by mouth daily.     . Emollient (CERAVE) LOTN Apply 1 application topically daily as needed (after shower care).     . ferrous sulfate 325 (65 FE) MG tablet Take 325 mg by mouth every other day.    . fluticasone (FLONASE) 50 MCG/ACT nasal spray Place 1 spray into both nostrils every other day.    Marland Kitchen GLUCOSAMINE-CHONDROITIN PO Take 1 tablet by mouth 2 (two) times daily.     Marland Kitchen losartan (COZAAR) 25 MG tablet TAKE 1 TABLET(25 MG) BY MOUTH TWICE DAILY (Patient taking differently: Take 25 mg by mouth 2 (two) times daily.) 180 tablet 3  . Multiple Vitamins-Minerals (CENTRUM ADULTS PO) Take 1 tablet by mouth daily.     . Omega-3 1000 MG CAPS Take 1,000 mg by mouth 2 (two) times daily.    Marland Kitchen dexamethasone (DECADRON) 4 MG tablet Take 1 tablet (4 mg total) by mouth 2 (two) times daily with a meal for 3 days. 6 tablet 6  . lidocaine-prilocaine (EMLA) cream Apply to affected area once (Patient taking differently: Apply 1 application topically daily as needed (port access).) 30 g 3  . LORazepam (ATIVAN) 0.5 MG tablet Take 1 tablet (0.5 mg total) by mouth every 6 (six) hours as needed (Nausea or vomiting). 30 tablet 0  . ondansetron (ZOFRAN) 8 MG tablet Take 1 tablet (8 mg total) by mouth 2 (two) times daily as needed. Start on the third day after cisplatin chemotherapy. 30 tablet 1  . prochlorperazine (COMPAZINE) 10 MG tablet Take 1 tablet (10 mg total) by mouth every 6 (six) hours as needed (Nausea or vomiting). 30 tablet 1   No current  facility-administered medications for this visit.   Facility-Administered Medications Ordered in Other Visits  Medication Dose Route Frequency Provider Last Rate Last Admin  . Sonafine emulsion 1  application  1 application Topical BID Lonie Peak, MD   1 application at 11/09/20 1320    PHYSICAL EXAMINATION: ECOG PERFORMANCE STATUS: 0 - Asymptomatic  Vitals:   11/09/20 1118  BP: (!) 162/84  Pulse: 68  Resp: 18  Temp: 97.9 F (36.6 C)  SpO2: 99%   Filed Weights   11/09/20 1118  Weight: 198 lb 12.8 oz (90.2 kg)    Physical Exam Constitutional:      Appearance: Normal appearance.  HENT:     Head: Normocephalic and atraumatic.  Cardiovascular:     Rate and Rhythm: Normal rate and regular rhythm.     Pulses: Normal pulses.     Heart sounds: Normal heart sounds.  Pulmonary:     Effort: Pulmonary effort is normal.     Breath sounds: Normal breath sounds.  Abdominal:     General: Abdomen is flat. Bowel sounds are normal.     Comments: Skin around the G-tube appears a bit erythematous but no overt evidence of infection.  Entire area could not be examined, this has been wrapped.  Musculoskeletal:        General: No swelling or tenderness. Normal range of motion.     Cervical back: Normal range of motion. No rigidity.  Lymphadenopathy:     Cervical: No cervical adenopathy.  Skin:    General: Skin is warm and dry.  Neurological:     General: No focal deficit present.     Mental Status: He is alert.  Psychiatric:        Mood and Affect: Mood normal.     LABORATORY DATA:  I have reviewed the data as listed Lab Results  Component Value Date   WBC 9.5 11/09/2020   HGB 10.6 (L) 11/09/2020   HCT 30.5 (L) 11/09/2020   MCV 93.6 11/09/2020   PLT 146 (L) 11/09/2020     Chemistry      Component Value Date/Time   NA 132 (L) 11/09/2020 1100   NA 135 02/03/2020 1134   K 4.4 11/09/2020 1100   CL 100 11/09/2020 1100   CO2 24 11/09/2020 1100   BUN 19 11/09/2020 1100   BUN  10 02/03/2020 1134   CREATININE 1.04 11/09/2020 1100   CREATININE 1.00 05/11/2020 1127      Component Value Date/Time   CALCIUM 9.4 11/09/2020 1100   ALKPHOS 79 10/20/2020 1414   AST 22 10/20/2020 1414   ALT 15 10/20/2020 1414   BILITOT 0.7 10/20/2020 1414      RADIOGRAPHIC STUDIES: I have personally reviewed the radiological images as listed and agreed with the findings in the report. IR Gastrostomy Tube  Result Date: 11/06/2020 INDICATION: Head neck squamous cell carcinoma, feeding tube placement prior to chemo radiation therapy EXAM: FLUOROSCOPIC 20 FRENCH PULL-THROUGH GASTROSTOMY Date:  11/06/2020 11/06/2020 10:27 am Radiologist:  M. Ruel Favors, MD Guidance:  Fluoroscopic MEDICATIONS: Ancef 2 g; Antibiotics were administered within 1 hour of the procedure. Glucagon 0.5 mg IV ANESTHESIA/SEDATION: Versed 2.0 mg IV; Fentanyl 50 mcg IV Moderate Sedation Time:  10 minutes The patient was continuously monitored during the procedure by the interventional radiology nurse under my direct supervision. CONTRAST:  57mL OMNIPAQUE IOHEXOL 300 MG/ML SOLN - administered into the gastric lumen. FLUOROSCOPY TIME:  Fluoroscopy Time: 2 minutes 54 seconds (28 mGy). COMPLICATIONS: None. PROCEDURE: Informed consent was obtained from the patient following explanation of the procedure, risks, benefits and alternatives. The patient understands, agrees and consents for the procedure. All questions were addressed. A time  out was performed. Maximal barrier sterile technique utilized including caps, mask, sterile gowns, sterile gloves, large sterile drape, hand hygiene, and betadine prep. The left upper quadrant was sterilely prepped and draped. An oral gastric catheter was inserted into the stomach under fluoroscopy. The existing nasogastric feeding tube was removed. Air was injected into the stomach for insufflation and visualization under fluoroscopy. The air distended stomach was confirmed beneath the anterior abdominal  wall in the frontal and lateral projections. Under sterile conditions and local anesthesia, a 27 gauge trocar needle was utilized to access the stomach percutaneously beneath the left subcostal margin. Needle position was confirmed within the stomach under biplane fluoroscopy. Contrast injection confirmed position also. A single T tack was deployed for gastropexy. Over an Amplatz guide wire, a 9-French sheath was inserted into the stomach. A snare device was utilized to capture the oral gastric catheter. The snare device was pulled retrograde from the stomach up the esophagus and out the oropharynx. The 20-French pull-through gastrostomy was connected to the snare device and pulled antegrade through the oropharynx down the esophagus into the stomach and then through the percutaneous tract external to the patient. The gastrostomy was assembled externally. Contrast injection confirms position in the stomach. Images were obtained for documentation. The patient tolerated procedure well. No immediate complication. IMPRESSION: Fluoroscopic insertion of a 20-French "pull-through" gastrostomy. Electronically Signed   By: Jerilynn Mages.  Shick M.D.   On: 11/06/2020 10:37   NM PET Image Initial (PI) Skull Base To Thigh  Result Date: 10/12/2020 CLINICAL DATA:  Initial treatment strategy for head and neck cancer (squamous cell carcinoma). EXAM: NUCLEAR MEDICINE PET SKULL BASE TO THIGH TECHNIQUE: 9.94 mCi F-18 FDG was injected intravenously. Full-ring PET imaging was performed from the skull base to thigh after the radiotracer. CT data was obtained and used for attenuation correction and anatomic localization. Fasting blood glucose: 98 mg/dl COMPARISON:  Neck CT 09/11/2020 FINDINGS: Mediastinal blood pool activity: SUV max 2.34 Liver activity: SUV max NA NECK: The patient has had an excisional biopsy of the right level 2 lymph node that was seen on the prior neck CT. Expected postoperative type changes in the surgical bed. No  significant residual areas of hypermetabolism in this area. The patient's primary lesion is in the right tongue base with a slightly irregular soft tissue mass on the CT scan correlating with hypermetabolism and SUV max of 7.73. No enlarged or hypermetabolic neck adenopathy. No supraclavicular adenopathy. Incidental CT findings: none CHEST: No enlarged or hypermetabolic supraclavicular or axillary lymph nodes. No mediastinal or hilar lymphadenopathy. No worrisome pulmonary nodules to suggest pulmonary metastatic disease. Incidental CT findings: Atherosclerotic calcifications involving the aorta and coronary arteries. ABDOMEN/PELVIS: No abnormal hypermetabolic activity within the liver, pancreas, adrenal glands, or spleen. No hypermetabolic lymph nodes in the abdomen or pelvis. Incidental CT findings: Aortoiliac stent graft is noted along with bilateral renal artery stents. No complicating features are identified. Colonic diverticulosis. TURP defect involving the prostate gland. SKELETON: No focal hypermetabolic activity to suggest skeletal metastasis. Incidental CT findings: none IMPRESSION: 1. Primary mucosal neoplasm in the right tongue base area demonstrating hypermetabolism. 2. Status post excisional biopsy of the enlarged right level 2 lymph node. No other enlarged or hypermetabolic neck nodes. 3. No PET-CT findings for metastatic disease involving the chest, abdomen, pelvis or osseous structures. Electronically Signed   By: Marijo Sanes M.D.   On: 10/12/2020 13:40   IR IMAGING GUIDED PORT INSERTION  Result Date: 11/06/2020 CLINICAL DATA:  Head neck squamous cell carcinoma,  access for chemotherapy EXAM: RIGHT INTERNAL JUGULAR SINGLE LUMEN POWER PORT CATHETER INSERTION Date:  11/06/2020 11/06/2020 10:28 am Radiologist:  M. Daryll Brod, MD Guidance:  Ultrasound and fluoroscopic MEDICATIONS: Ancef 2 g for both procedures; The antibiotic was administered within an appropriate time interval prior to skin  puncture. ANESTHESIA/SEDATION: Versed 1.0 mg IV; Fentanyl 50 mcg IV; Moderate Sedation Time:  27 minutes The patient was continuously monitored during the procedure by the interventional radiology nurse under my direct supervision. FLUOROSCOPY TIME:  0 minutes, 54 seconds (5 mGy) COMPLICATIONS: None immediate. CONTRAST:  None. PROCEDURE: Informed consent was obtained from the patient following explanation of the procedure, risks, benefits and alternatives. The patient understands, agrees and consents for the procedure. All questions were addressed. A time out was performed. Maximal barrier sterile technique utilized including caps, mask, sterile gowns, sterile gloves, large sterile drape, hand hygiene, and 2% chlorhexidine scrub. Under sterile conditions and local anesthesia, right internal jugular micropuncture venous access was performed. Access was performed with ultrasound. Images were obtained for documentation of the patent right internal jugular vein. A guide wire was inserted followed by a transitional dilator. This allowed insertion of a guide wire and catheter into the IVC. Measurements were obtained from the SVC / RA junction back to the right IJ venotomy site. In the right infraclavicular chest, a subcutaneous pocket was created over the second anterior rib. This was done under sterile conditions and local anesthesia. 1% lidocaine with epinephrine was utilized for this. A 2.5 cm incision was made in the skin. Blunt dissection was performed to create a subcutaneous pocket over the right pectoralis major muscle. The pocket was flushed with saline vigorously. There was adequate hemostasis. The port catheter was assembled and checked for leakage. The port catheter was secured in the pocket with two retention sutures. The tubing was tunneled subcutaneously to the right venotomy site and inserted into the SVC/RA junction through a valved peel-away sheath. Position was confirmed with fluoroscopy. Images were  obtained for documentation. The patient tolerated the procedure well. No immediate complications. Incisions were closed in a two layer fashion with 4 - 0 Vicryl suture. Dermabond was applied to the skin. The port catheter was accessed, blood was aspirated followed by saline and heparin flushes. Needle was removed. A dry sterile dressing was applied. IMPRESSION: Ultrasound and fluoroscopically guided right internal jugular single lumen power port catheter insertion. Tip in the SVC/RA junction. Catheter ready for use. Electronically Signed   By: Jerilynn Mages.  Shick M.D.   On: 11/06/2020 10:36   I have reviewed images independently, reviewed pathology reports. Labs reviewed from today, satisfactory to proceed.  All questions were answered. The patient knows to call the clinic with any problems, questions or concerns. I have spent a total of 30 minutes in the care of this patient today including history, physical exam, review of his labs, and coordination of care.  We have again discussed about anticipated side effects from chemotherapy, G-tube monitoring, importance of hydration, need to use G-tube if he ends up having difficulty with oral intake.    Benay Pike, MD 11/09/2020 4:04 PM

## 2020-11-10 ENCOUNTER — Ambulatory Visit
Admission: RE | Admit: 2020-11-10 | Discharge: 2020-11-10 | Disposition: A | Discharge status: Home / Self Care | Payer: Medicare Other | Source: Ambulatory Visit | Attending: Radiation Oncology | Admitting: Radiation Oncology

## 2020-11-10 ENCOUNTER — Ambulatory Visit: Payer: Medicare Other | Admitting: Physician Assistant

## 2020-11-10 ENCOUNTER — Inpatient Hospital Stay: Payer: Medicare Other

## 2020-11-10 VITALS — BP 147/87 | HR 72 | Temp 98.0°F | Resp 16

## 2020-11-10 DIAGNOSIS — C109 Malignant neoplasm of oropharynx, unspecified: Secondary | ICD-10-CM | POA: Diagnosis not present

## 2020-11-10 DIAGNOSIS — C01 Malignant neoplasm of base of tongue: Secondary | ICD-10-CM | POA: Diagnosis not present

## 2020-11-10 DIAGNOSIS — Z510 Encounter for antineoplastic radiation therapy: Secondary | ICD-10-CM | POA: Diagnosis not present

## 2020-11-10 DIAGNOSIS — Z87891 Personal history of nicotine dependence: Secondary | ICD-10-CM | POA: Diagnosis not present

## 2020-11-10 DIAGNOSIS — Z800 Family history of malignant neoplasm of digestive organs: Secondary | ICD-10-CM | POA: Diagnosis not present

## 2020-11-10 DIAGNOSIS — Z5111 Encounter for antineoplastic chemotherapy: Secondary | ICD-10-CM | POA: Diagnosis not present

## 2020-11-10 MED ORDER — HEPARIN SOD (PORK) LOCK FLUSH 100 UNIT/ML IV SOLN
500.0000 [IU] | Freq: Once | INTRAVENOUS | Status: AC | PRN
  Administered 2020-11-10: 500 [IU]
  Filled 2020-11-10: qty 5

## 2020-11-10 MED ORDER — ACETAMINOPHEN 325 MG PO TABS
ORAL_TABLET | ORAL | Status: AC
  Filled 2020-11-10: qty 2

## 2020-11-10 MED ORDER — PALONOSETRON HCL INJECTION 0.25 MG/5ML
0.2500 mg | Freq: Once | INTRAVENOUS | Status: AC
  Administered 2020-11-10: 0.25 mg via INTRAVENOUS

## 2020-11-10 MED ORDER — ACETAMINOPHEN 325 MG PO TABS
650.0000 mg | ORAL_TABLET | Freq: Once | ORAL | Status: AC
  Administered 2020-11-10: 650 mg via ORAL

## 2020-11-10 MED ORDER — SODIUM CHLORIDE 0.9% FLUSH
10.0000 mL | INTRAVENOUS | Status: DC | PRN
  Administered 2020-11-10: 10 mL
  Filled 2020-11-10: qty 10

## 2020-11-10 MED ORDER — MAGNESIUM SULFATE 2 GM/50ML IV SOLN
INTRAVENOUS | Status: AC
  Filled 2020-11-10: qty 50

## 2020-11-10 MED ORDER — MAGNESIUM SULFATE 2 GM/50ML IV SOLN
2.0000 g | Freq: Once | INTRAVENOUS | Status: AC
  Administered 2020-11-10: 2 g via INTRAVENOUS

## 2020-11-10 MED ORDER — POTASSIUM CHLORIDE IN NACL 20-0.9 MEQ/L-% IV SOLN
Freq: Once | INTRAVENOUS | Status: AC
Start: 2020-11-10 — End: 2020-11-10
  Filled 2020-11-10: qty 1000

## 2020-11-10 MED ORDER — PALONOSETRON HCL INJECTION 0.25 MG/5ML
INTRAVENOUS | Status: AC
  Filled 2020-11-10: qty 5

## 2020-11-10 MED ORDER — SODIUM CHLORIDE 0.9 % IV SOLN
150.0000 mg | Freq: Once | INTRAVENOUS | Status: AC
  Administered 2020-11-10: 150 mg via INTRAVENOUS
  Filled 2020-11-10: qty 150

## 2020-11-10 MED ORDER — SODIUM CHLORIDE 0.9 % IV SOLN
40.0000 mg/m2 | Freq: Once | INTRAVENOUS | Status: AC
  Administered 2020-11-10: 85 mg via INTRAVENOUS
  Filled 2020-11-10: qty 85

## 2020-11-10 MED ORDER — SODIUM CHLORIDE 0.9 % IV SOLN
10.0000 mg | Freq: Once | INTRAVENOUS | Status: AC
  Administered 2020-11-10: 10 mg via INTRAVENOUS
  Filled 2020-11-10: qty 10

## 2020-11-10 MED ORDER — SODIUM CHLORIDE 0.9 % IV SOLN
Freq: Once | INTRAVENOUS | Status: AC
Start: 2020-11-10 — End: 2020-11-10
  Filled 2020-11-10: qty 250

## 2020-11-10 NOTE — Patient Instructions (Signed)
[facility]  Discharge Instructions: Thank you for choosing [facility] to provide your oncology and hematology care.   If you have a lab appointment with the [facility], please go directly to the [facility] and check in at the registration area.   Wear comfortable clothing and clothing appropriate for easy access to any Portacath or PICC line.   We strive to give you quality time with your provider. You may need to reschedule your appointment if you arrive late (15 or more minutes).  Arriving late affects you and other patients whose appointments are after yours.  Also, if you miss three or more appointments without notifying the office, you may be dismissed from the clinic at the provider's discretion.      For prescription refill requests, have your pharmacy contact our office and allow 72 hours for refills to be completed.    Today you received the following chemotherapy and/or immunotherapy agent: Cisplatin   To help prevent nausea and vomiting after your treatment, we encourage you to take your nausea medication as directed.  BELOW ARE SYMPTOMS THAT SHOULD BE REPORTED IMMEDIATELY: . *FEVER GREATER THAN 100.4 F (38 C) OR HIGHER . *CHILLS OR SWEATING . *NAUSEA AND VOMITING THAT IS NOT CONTROLLED WITH YOUR NAUSEA MEDICATION . *UNUSUAL SHORTNESS OF BREATH . *UNUSUAL BRUISING OR BLEEDING . *URINARY PROBLEMS (pain or burning when urinating, or frequent urination) . *BOWEL PROBLEMS (unusual diarrhea, constipation, pain near the anus) . TENDERNESS IN MOUTH AND THROAT WITH OR WITHOUT PRESENCE OF ULCERS (sore throat, sores in mouth, or a toothache) . UNUSUAL RASH, SWELLING OR PAIN  . UNUSUAL VAGINAL DISCHARGE OR ITCHING   Items with * indicate a potential emergency and should be followed up as soon as possible or go to the Emergency Department if any problems should occur.  Please show the CHEMOTHERAPY ALERT CARD or IMMUNOTHERAPY ALERT CARD  at check-in to the Emergency Department and triage nurse.  Should you have questions after your visit or need to cancel or reschedule your appointment, please contact [facility]  Dept: [phone]  and follow the prompts.  Office hours are 8:00 a.m. to 4:30 p.m. Monday - Friday. Please note that voicemails left after 4:00 p.m. may not be returned until the following business day.  We are closed weekends and major holidays. You have access to a nurse at all times for urgent questions. Please call the main number to the clinic Dept: [phone] and follow the prompts.   For any non-urgent questions, you may also contact your provider using MyChart. We now offer e-Visits for anyone 29 and older to request care online for non-urgent symptoms. For details visit [email or web address].   Also download the MyChart app! Go to the app store, search "MyChart", open the app, select [facility], and log in with your MyChart username and password.  Due to Covid, a mask is required upon entering the hospital/clinic. If you do not have a mask, one will be given to you upon arrival. For doctor visits, patients may have 1 support person aged 30 or older with them. For treatment visits, patients cannot have anyone with them due to current Covid guidelines and our immunocompromised population.   Cisplatin injection What is this medicine? CISPLATIN (SIS pla tin) is a chemotherapy drug. It targets fast dividing cells, like cancer cells, and causes these cells to die. This medicine is used to treat many types of cancer like bladder, ovarian, and  testicular cancers. This medicine may be used for other purposes; ask your health care provider or pharmacist if you have questions. COMMON BRAND NAME(S): Platinol, Platinol -AQ What should I tell my health care provider before I take this medicine? They need to know if you have any of these conditions:  eye disease, vision problems  hearing  problems  kidney disease  low blood counts, like white cells, platelets, or red blood cells  tingling of the fingers or toes, or other nerve disorder  an unusual or allergic reaction to cisplatin, carboplatin, oxaliplatin, other medicines, foods, dyes, or preservatives  pregnant or trying to get pregnant  breast-feeding How should I use this medicine? This drug is given as an infusion into a vein. It is administered in a hospital or clinic by a specially trained health care professional. Talk to your pediatrician regarding the use of this medicine in children. Special care may be needed. Overdosage: If you think you have taken too much of this medicine contact a poison control center or emergency room at once. NOTE: This medicine is only for you. Do not share this medicine with others. What if I miss a dose? It is important not to miss a dose. Call your doctor or health care professional if you are unable to keep an appointment. What may interact with this medicine? This medicine may interact with the following medications:  foscarnet  certain antibiotics like amikacin, gentamicin, neomycin, polymyxin B, streptomycin, tobramycin, vancomycin This list may not describe all possible interactions. Give your health care provider a list of all the medicines, herbs, non-prescription drugs, or dietary supplements you use. Also tell them if you smoke, drink alcohol, or use illegal drugs. Some items may interact with your medicine. What should I watch for while using this medicine? Your condition will be monitored carefully while you are receiving this medicine. You will need important blood work done while you are taking this medicine. This drug may make you feel generally unwell. This is not uncommon, as chemotherapy can affect healthy cells as well as cancer cells. Report any side effects. Continue your course of treatment even though you feel ill unless your doctor tells you to stop. This  medicine may increase your risk of getting an infection. Call your healthcare professional for advice if you get a fever, chills, or sore throat, or other symptoms of a cold or flu. Do not treat yourself. Try to avoid being around people who are sick. Avoid taking medicines that contain aspirin, acetaminophen, ibuprofen, naproxen, or ketoprofen unless instructed by your healthcare professional. These medicines may hide a fever. This medicine may increase your risk to bruise or bleed. Call your doctor or health care professional if you notice any unusual bleeding. Be careful brushing and flossing your teeth or using a toothpick because you may get an infection or bleed more easily. If you have any dental work done, tell your dentist you are receiving this medicine. Do not become pregnant while taking this medicine or for 14 months after stopping it. Women should inform their healthcare professional if they wish to become pregnant or think they might be pregnant. Men should not father a child while taking this medicine and for 11 months after stopping it. There is potential for serious side effects to an unborn child. Talk to your healthcare professional for more information. Do not breast-feed an infant while taking this medicine. This medicine has caused ovarian failure in some women. This medicine may make it more difficult to  get pregnant. Talk to your healthcare professional if you are concerned about your fertility. This medicine has caused decreased sperm counts in some men. This may make it more difficult to father a child. Talk to your healthcare professional if you are concerned about your fertility. Drink fluids as directed while you are taking this medicine. This will help protect your kidneys. Call your doctor or health care professional if you get diarrhea. Do not treat yourself. What side effects may I notice from receiving this medicine? Side effects that you should report to your doctor or  health care professional as soon as possible:  allergic reactions like skin rash, itching or hives, swelling of the face, lips, or tongue  blurred vision  changes in vision  decreased hearing or ringing of the ears  nausea, vomiting  pain, redness, or irritation at site where injected  pain, tingling, numbness in the hands or feet  signs and symptoms of bleeding such as bloody or black, tarry stools; red or dark brown urine; spitting up blood or brown material that looks like coffee grounds; red spots on the skin; unusual bruising or bleeding from the eyes, gums, or nose  signs and symptoms of infection like fever; chills; cough; sore throat; pain or trouble passing urine  signs and symptoms of kidney injury like trouble passing urine or change in the amount of urine  signs and symptoms of low red blood cells or anemia such as unusually weak or tired; feeling faint or lightheaded; falls; breathing problems Side effects that usually do not require medical attention (report to your doctor or health care professional if they continue or are bothersome):  loss of appetite  mouth sores  muscle cramps This list may not describe all possible side effects. Call your doctor for medical advice about side effects. You may report side effects to FDA at 1-800-FDA-1088. Where should I keep my medicine? This drug is given in a hospital or clinic and will not be stored at home. NOTE: This sheet is a summary. It may not cover all possible information. If you have questions about this medicine, talk to your doctor, pharmacist, or health care provider.  2021 [employer] (2018-06-01 15:59:17)

## 2020-11-11 ENCOUNTER — Ambulatory Visit
Admission: RE | Admit: 2020-11-11 | Discharge: 2020-11-11 | Disposition: A | Discharge status: Home / Self Care | Payer: Medicare Other | Source: Ambulatory Visit | Attending: Radiation Oncology | Admitting: Radiation Oncology

## 2020-11-11 ENCOUNTER — Telehealth: Payer: Self-pay | Admitting: *Deleted

## 2020-11-11 ENCOUNTER — Other Ambulatory Visit: Payer: Self-pay

## 2020-11-11 ENCOUNTER — Encounter (HOSPITAL_COMMUNITY): Payer: Self-pay

## 2020-11-11 DIAGNOSIS — C109 Malignant neoplasm of oropharynx, unspecified: Secondary | ICD-10-CM | POA: Diagnosis not present

## 2020-11-11 DIAGNOSIS — Z510 Encounter for antineoplastic radiation therapy: Secondary | ICD-10-CM | POA: Diagnosis not present

## 2020-11-11 DIAGNOSIS — Z87891 Personal history of nicotine dependence: Secondary | ICD-10-CM | POA: Diagnosis not present

## 2020-11-11 DIAGNOSIS — C01 Malignant neoplasm of base of tongue: Secondary | ICD-10-CM | POA: Diagnosis not present

## 2020-11-12 ENCOUNTER — Other Ambulatory Visit: Payer: Self-pay

## 2020-11-12 ENCOUNTER — Ambulatory Visit
Admission: RE | Admit: 2020-11-12 | Discharge: 2020-11-12 | Disposition: A | Discharge status: Home / Self Care | Payer: Medicare Other | Source: Ambulatory Visit | Attending: Radiation Oncology | Admitting: Radiation Oncology

## 2020-11-12 DIAGNOSIS — C01 Malignant neoplasm of base of tongue: Secondary | ICD-10-CM | POA: Diagnosis not present

## 2020-11-12 DIAGNOSIS — Z87891 Personal history of nicotine dependence: Secondary | ICD-10-CM | POA: Diagnosis not present

## 2020-11-12 DIAGNOSIS — C109 Malignant neoplasm of oropharynx, unspecified: Secondary | ICD-10-CM | POA: Diagnosis not present

## 2020-11-12 DIAGNOSIS — Z510 Encounter for antineoplastic radiation therapy: Secondary | ICD-10-CM | POA: Diagnosis not present

## 2020-11-12 MED ORDER — LIDOCAINE-PRILOCAINE 2.5-2.5 % EX CREA: 1.0000 "application " | TOPICAL_CREAM | CUTANEOUS | 3 refills | Status: DC | PRN

## 2020-11-13 ENCOUNTER — Other Ambulatory Visit: Payer: Self-pay

## 2020-11-13 ENCOUNTER — Ambulatory Visit
Admission: RE | Admit: 2020-11-13 | Discharge: 2020-11-13 | Disposition: A | Discharge status: Home / Self Care | Payer: Medicare Other | Source: Ambulatory Visit | Attending: Radiation Oncology | Admitting: Radiation Oncology

## 2020-11-13 ENCOUNTER — Telehealth: Payer: Self-pay

## 2020-11-13 DIAGNOSIS — Z510 Encounter for antineoplastic radiation therapy: Secondary | ICD-10-CM | POA: Diagnosis not present

## 2020-11-13 DIAGNOSIS — Z87891 Personal history of nicotine dependence: Secondary | ICD-10-CM | POA: Diagnosis not present

## 2020-11-13 DIAGNOSIS — C01 Malignant neoplasm of base of tongue: Secondary | ICD-10-CM | POA: Diagnosis not present

## 2020-11-13 DIAGNOSIS — C109 Malignant neoplasm of oropharynx, unspecified: Secondary | ICD-10-CM | POA: Diagnosis not present

## 2020-11-13 NOTE — Telephone Encounter (Signed)
Received a call from the patient's insurance stating that his Lidocaine-Prilocaine EMLA Cream was approved. I contacted his preferred pharmacy to notify them and I also touched base with the patient to inform him.

## 2020-11-17 ENCOUNTER — Other Ambulatory Visit: Payer: Self-pay | Admitting: Radiation Oncology

## 2020-11-17 ENCOUNTER — Ambulatory Visit
Admission: RE | Admit: 2020-11-17 | Discharge: 2020-11-17 | Disposition: A | Discharge status: Home / Self Care | Payer: Medicare Other | Source: Ambulatory Visit | Attending: Radiation Oncology | Admitting: Radiation Oncology

## 2020-11-17 DIAGNOSIS — C109 Malignant neoplasm of oropharynx, unspecified: Secondary | ICD-10-CM | POA: Diagnosis not present

## 2020-11-17 DIAGNOSIS — C01 Malignant neoplasm of base of tongue: Secondary | ICD-10-CM | POA: Diagnosis not present

## 2020-11-17 DIAGNOSIS — Z87891 Personal history of nicotine dependence: Secondary | ICD-10-CM | POA: Diagnosis not present

## 2020-11-17 DIAGNOSIS — Z510 Encounter for antineoplastic radiation therapy: Secondary | ICD-10-CM | POA: Diagnosis not present

## 2020-11-17 MED ORDER — LIDOCAINE VISCOUS HCL 2 % MT SOLN: OROMUCOSAL | 3 refills | Status: DC

## 2020-11-18 ENCOUNTER — Inpatient Hospital Stay: Payer: Medicare Other | Admitting: Hematology and Oncology

## 2020-11-18 ENCOUNTER — Inpatient Hospital Stay: Payer: Medicare Other | Attending: Hematology and Oncology

## 2020-11-18 ENCOUNTER — Other Ambulatory Visit: Payer: Self-pay

## 2020-11-18 ENCOUNTER — Encounter: Payer: Self-pay | Admitting: Hematology and Oncology

## 2020-11-18 ENCOUNTER — Ambulatory Visit
Admission: RE | Admit: 2020-11-18 | Discharge: 2020-11-18 | Disposition: A | Discharge status: Home / Self Care | Payer: Medicare Other | Source: Ambulatory Visit | Attending: Radiation Oncology | Admitting: Radiation Oncology

## 2020-11-18 DIAGNOSIS — T451X5A Adverse effect of antineoplastic and immunosuppressive drugs, initial encounter: Secondary | ICD-10-CM | POA: Diagnosis not present

## 2020-11-18 DIAGNOSIS — Z5111 Encounter for antineoplastic chemotherapy: Secondary | ICD-10-CM | POA: Insufficient documentation

## 2020-11-18 DIAGNOSIS — R5383 Other fatigue: Secondary | ICD-10-CM | POA: Insufficient documentation

## 2020-11-18 DIAGNOSIS — Z510 Encounter for antineoplastic radiation therapy: Secondary | ICD-10-CM | POA: Insufficient documentation

## 2020-11-18 DIAGNOSIS — R634 Abnormal weight loss: Secondary | ICD-10-CM | POA: Diagnosis not present

## 2020-11-18 DIAGNOSIS — R630 Anorexia: Secondary | ICD-10-CM | POA: Insufficient documentation

## 2020-11-18 DIAGNOSIS — D649 Anemia, unspecified: Secondary | ICD-10-CM

## 2020-11-18 DIAGNOSIS — Z95828 Presence of other vascular implants and grafts: Secondary | ICD-10-CM

## 2020-11-18 DIAGNOSIS — Z800 Family history of malignant neoplasm of digestive organs: Secondary | ICD-10-CM | POA: Diagnosis not present

## 2020-11-18 DIAGNOSIS — C109 Malignant neoplasm of oropharynx, unspecified: Secondary | ICD-10-CM | POA: Diagnosis not present

## 2020-11-18 DIAGNOSIS — D6481 Anemia due to antineoplastic chemotherapy: Secondary | ICD-10-CM | POA: Diagnosis not present

## 2020-11-18 DIAGNOSIS — Z87891 Personal history of nicotine dependence: Secondary | ICD-10-CM | POA: Insufficient documentation

## 2020-11-18 DIAGNOSIS — Z931 Gastrostomy status: Secondary | ICD-10-CM | POA: Diagnosis not present

## 2020-11-18 DIAGNOSIS — D6959 Other secondary thrombocytopenia: Secondary | ICD-10-CM | POA: Insufficient documentation

## 2020-11-18 DIAGNOSIS — K1231 Oral mucositis (ulcerative) due to antineoplastic therapy: Secondary | ICD-10-CM | POA: Insufficient documentation

## 2020-11-18 DIAGNOSIS — K5903 Drug induced constipation: Secondary | ICD-10-CM | POA: Insufficient documentation

## 2020-11-18 DIAGNOSIS — C01 Malignant neoplasm of base of tongue: Secondary | ICD-10-CM | POA: Diagnosis not present

## 2020-11-18 LAB — CBC WITH DIFFERENTIAL (CANCER CENTER ONLY)
Abs Immature Granulocytes: 0.09 10*3/uL — ABNORMAL HIGH (ref 0.00–0.07)
Basophils Absolute: 0 10*3/uL (ref 0.0–0.1)
Basophils Relative: 0 %
Eosinophils Absolute: 0 10*3/uL (ref 0.0–0.5)
Eosinophils Relative: 0 %
HCT: 26.9 % — ABNORMAL LOW (ref 39.0–52.0)
Hemoglobin: 9.6 g/dL — ABNORMAL LOW (ref 13.0–17.0)
Immature Granulocytes: 1 %
Lymphocytes Relative: 10 %
Lymphs Abs: 0.6 10*3/uL — ABNORMAL LOW (ref 0.7–4.0)
MCH: 32.3 pg (ref 26.0–34.0)
MCHC: 35.7 g/dL (ref 30.0–36.0)
MCV: 90.6 fL (ref 80.0–100.0)
Monocytes Absolute: 1 10*3/uL (ref 0.1–1.0)
Monocytes Relative: 16 %
Neutro Abs: 4.6 10*3/uL (ref 1.7–7.7)
Neutrophils Relative %: 73 %
Platelet Count: 157 10*3/uL (ref 150–400)
RBC: 2.97 MIL/uL — ABNORMAL LOW (ref 4.22–5.81)
RDW: 12.8 % (ref 11.5–15.5)
WBC Count: 6.4 10*3/uL (ref 4.0–10.5)
nRBC: 0 % (ref 0.0–0.2)

## 2020-11-18 LAB — BASIC METABOLIC PANEL - CANCER CENTER ONLY
Anion gap: 12 (ref 5–15)
BUN: 21 mg/dL (ref 8–23)
CO2: 20 mmol/L — ABNORMAL LOW (ref 22–32)
Calcium: 9.2 mg/dL (ref 8.9–10.3)
Chloride: 93 mmol/L — ABNORMAL LOW (ref 98–111)
Creatinine: 1.02 mg/dL (ref 0.61–1.24)
GFR, Estimated: >60 mL/min (ref >60)
Glucose, Bld: 88 mg/dL (ref 70–99)
Potassium: 4.6 mmol/L (ref 3.5–5.1)
Sodium: 125 mmol/L — ABNORMAL LOW (ref 135–145)

## 2020-11-18 LAB — MAGNESIUM: Magnesium: 1.6 mg/dL — ABNORMAL LOW (ref 1.7–2.4)

## 2020-11-18 MED ORDER — SODIUM CHLORIDE 0.9% FLUSH
10.0000 mL | Freq: Once | INTRAVENOUS | Status: AC
  Administered 2020-11-18: 10 mL
  Filled 2020-11-18: qty 10

## 2020-11-18 MED ORDER — HEPARIN SOD (PORK) LOCK FLUSH 100 UNIT/ML IV SOLN
500.0000 [IU] | Freq: Once | INTRAVENOUS | Status: AC
  Administered 2020-11-18: 500 [IU]
  Filled 2020-11-18: qty 5

## 2020-11-18 NOTE — Progress Notes (Signed)
[facility] NOTE  Patient Care Team: Vivi Barrack, MD as PCP - General (Family Medicine) Buford Dresser, MD as PCP - Cardiology (Cardiology) Chevis Pretty as Consulting Physician (Dentistry) [name], MD as Consulting Physician (Vascular Surgery) [name], [name], PA-C as Physician Assistant (Dermatology) Malmfelt, Stephani Police, RN as Oncology Nurse Navigator Eppie Gibson, MD as Consulting Physician (Radiation Oncology) Leta Baptist, MD as Consulting Physician (Otolaryngology)  CHIEF COMPLAINTS/PURPOSE OF CONSULTATION:  Oropharyngeal cancer.  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 He is doing well except for some fatigue, loss of appetite.  No mucositis noted.  No nausea, vomiting, neuropathy or ototoxicity reported. Physical examination today, tired appearing male patient with no acute distress.  No other major findings. Labs reviewed and satisfactory to proceed.  Normocytic normochromic anemia Patient has some baseline anemia for years.  This has worsened in the past couple weeks likely secondary to chemotherapy.  No indication for transfusion.  We will continue to monitor.  No orders of the defined types were placed in this encounter.  HISTORY OF PRESENTING ILLNESS:   Jeffrey [name] 80 y.o. male is here because of new diagnosis of oropharyngeal cancer.   Oncology History Overview Note  Patient first noticed to have neck mass about 2 months prior to presentation. He denies any complaints except for neck mass, no difficulty swallowing, pain at the base of the tongue. He went to see his PCP who recommended an [place] which demonstrated hypoechoic mass at the right level II a highly concerning for an enlarged pathologic LN. He was referred to ENT and CT neck  was ordered. CT neck showed enlarged right level 2 node, asymmetric soft tissue at the right tongue base, favored to reflect asymmetric tonsillar issue. 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 PET CT showed primary mucosal neoplasm in the right tongue base area demonstrating hypermetabolism.    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 Eating small meals multiple times a day No nausea or vomiting Some discoloration of the G tube dressing without obvious purulence. Constipation noted, not taking any OTC supplements. No change in urinary habits. No fevers or chills.  Rest of the pertinent review of systems reviewed and negative.  MEDICAL HISTORY:  Past Medical History:  Diagnosis Date  . AAA (abdominal aortic aneurysm) ([place])   . Allergy   . Arthritis   . Colon polyps   . Coronary artery calcification   . Eczematous dermatitis   . GERD (gastroesophageal reflux disease)   . Hypertension   . Iliac artery stenosis, left ([place])   . 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 Osteopenia  SURGICAL HISTORY: Past Surgical History:  Procedure Laterality Date  . ABDOMINAL AORTIC ENDOVASCULAR FENESTRATED STENT GRAFT N/A 03/21/2018   Procedure: ABDOMINAL AORTIC ENDOVASCULAR FENESTRATED STENT GRAFT ; [place] CT PERFORMED;  Surgeon: [name], MD;  Location: [facility];  Service: Vascular;  Laterality: N/A;  . Basal Skin Cancer  2012  . CATARACT EXTRACTION Bilateral   . IR GASTROSTOMY TUBE MOD SED  11/06/2020  . IR IMAGING GUIDED PORT INSERTION  11/06/2020  . MASS BIOPSY Right 09/25/2020   Procedure: OPEN NECK MASS BIOPSY;  Surgeon: Leta Baptist, MD;  Location: [facility];  Service: ENT;  Laterality: Right;  . TONSILLECTOMY    . TRANSURETHRAL RESECTION OF PROSTATE      SOCIAL HISTORY: Social History   Socioeconomic History  . Marital status: Divorced    Spouse name: Not on file  . Number of children: 1  . Years of education: Not on file  . Highest education level: Not on file  Occupational History  . Occupation: Retired    Fish farm manager: [employer]  Tobacco Use  . Smoking status: Former Smoker    Packs/day: 1.00    Years: 32.00    Pack years: 32.00    Types: Cigarettes    Quit date: 08/17/1990    Years since quitting: 30.2  . Smokeless tobacco: Never Used  Vaping Use  . Vaping Use: Never used  Substance and Sexual Activity  . Alcohol use: Yes    Comment: Occassionally, "when I feel like it"  . Drug use: No  . Sexual activity: Yes    Partners: Female  Other Topics Concern  . Not on file  Social History Narrative  . Not on file   Social Determinants of Health   Financial Resource Strain: Low Risk   . Difficulty of Paying Living Expenses: Not hard at all  Food Insecurity: No Food Insecurity  . Worried About Charity fundraiser in the Last Year: Never true  . Ran Out of Food in the Last Year: Never true  Transportation Needs: No Transportation Needs  . Lack of Transportation (Medical): No  . Lack of Transportation (Non-Medical): No  Physical Activity: Insufficiently Active  . Days of Exercise per Week: 5 days  . Minutes of Exercise per Session: 20 min  Stress: No Stress Concern Present  . Feeling of Stress : Not at all  Social Connections: Moderately Isolated  . Frequency of Communication  with Friends and Family: Once a week  . Frequency of Social Gatherings with Friends and Family: Twice a week  . Attends Religious Services: Never  . Active Member of Clubs or Organizations: Yes  . Attends Archivist Meetings: 1 to 4 times per year  . Marital Status: Divorced  Human resources officer Violence: Not At Risk  . Fear of Current or Ex-Partner: No  . Emotionally Abused: No  . Physically Abused: No  . Sexually Abused: No    FAMILY HISTORY: Family History  Problem Relation Age of Onset  . Diabetes Mother   . Hypertension Mother   . Stroke Mother   . Alcohol abuse Father   . Heart attack Father   . Heart disease Father   . Hypertension Father   . Cancer Sister   . Hyperlipidemia Sister   . Pancreatic cancer Sister   . Pancreatic cancer Brother   . Hypertension Son     ALLERGIES:  is allergic to ketotifen fumarate, mixed ragweed, and triamcinolone.  MEDICATIONS:  Current Outpatient  Medications  Medication Sig Dispense Refill  . acetaminophen (TYLENOL) 650 MG CR tablet Take 1,300 mg by mouth every 8 (eight) hours as needed for pain.    Marland Kitchen amLODipine (NORVASC) 5 MG tablet Take 1 tablet (5 mg total) by mouth daily. 90 tablet 3  . ARTIFICIAL TEAR SOLUTION OP Place 1 drop into both eyes daily as needed (dry eyes).    . Ascorbic Acid (VITAMIN C) 1000 MG tablet Take 1,000 mg by mouth daily.     Marland Kitchen aspirin EC 81 MG tablet Take 81 mg by mouth daily. Swallow whole.    Marland Kitchen atorvastatin (LIPITOR) 40 MG tablet TAKE 1 TABLET(40 MG) BY MOUTH DAILY AT 6 PM (Patient taking differently: Take 40 mg by mouth daily at 6 PM.) 90 tablet 1  . Calcium Carbonate-Vitamin D 600-200 MG-UNIT TABS Take 1 tablet by mouth every other day.     . Cyanocobalamin 1000 MCG TBCR Take 1,000 mcg by mouth daily.     . Emollient (CERAVE) LOTN Apply 1 application topically daily as needed (after shower care).     . ferrous sulfate 325 (65 FE) MG tablet Take 325 mg by mouth every other day.    . fluticasone  (FLONASE) 50 MCG/ACT nasal spray Place 1 spray into both nostrils every other day.    Marland Kitchen GLUCOSAMINE-CHONDROITIN PO Take 1 tablet by mouth 2 (two) times daily.     Marland Kitchen lidocaine (XYLOCAINE) 2 % solution Patient: Mix 1part 2% viscous lidocaine, 1part H20. Swish & swallow 67mL of diluted mixture, 42min before meals and at bedtime, up to QID 200 mL 3  . lidocaine-prilocaine (EMLA) cream Apply 1 application topically as needed (Utilize prior to port-a-cath needle access). 30 g 3  . LORazepam (ATIVAN) 0.5 MG tablet Take 1 tablet (0.5 mg total) by mouth every 6 (six) hours as needed (Nausea or vomiting). 30 tablet 0  . losartan (COZAAR) 25 MG tablet TAKE 1 TABLET(25 MG) BY MOUTH TWICE DAILY (Patient taking differently: Take 25 mg by mouth 2 (two) times daily.) 180 tablet 3  . Multiple Vitamins-Minerals (CENTRUM ADULTS PO) Take 1 tablet by mouth daily.     . Omega-3 1000 MG CAPS Take 1,000 mg by mouth 2 (two) times daily.    . ondansetron (ZOFRAN) 8 MG tablet Take 1 tablet (8 mg total) by mouth 2 (two) times daily as needed. Start on the third day after cisplatin chemotherapy. 30 tablet 1  . prochlorperazine (COMPAZINE) 10 MG tablet Take 1 tablet (10 mg total) by mouth every 6 (six) hours as needed (Nausea or vomiting). 30 tablet 1   No current facility-administered medications for this visit.   Facility-Administered Medications Ordered in Other Visits  Medication Dose Route Frequency Provider Last Rate Last Admin  . heparin lock flush 100 unit/mL  500 Units Intracatheter Once PRN Tavionna Grout, MD      . sodium chloride flush (NS) 0.9 % injection 10 mL  10 mL Intracatheter PRN Maite Burlison, MD        PHYSICAL EXAMINATION: ECOG PERFORMANCE STATUS: 0 - Asymptomatic  Vitals:   11/18/20 1509  BP: (!) 162/75  Pulse: 72  Resp: 20  Temp: 97.8 F (36.6 C)  SpO2: 97%   Filed Weights   11/18/20 1509  Weight: 195 lb 14.4 oz (88.9 kg)    Physical Exam Constitutional:      Appearance: Normal  appearance.  HENT:     Head: Normocephalic and atraumatic.  Cardiovascular:     Rate and Rhythm:  Normal rate and regular rhythm.     Pulses: Normal pulses.     Heart sounds: Normal heart sounds.  Pulmonary:     Effort: Pulmonary effort is normal.     Breath sounds: Normal breath sounds.  Abdominal:     General: Abdomen is flat. Bowel sounds are normal.     Comments: No concern for infection around the G tube   Musculoskeletal:        General: No swelling or tenderness. Normal range of motion.     Cervical back: Normal range of motion. No rigidity.  Lymphadenopathy:     Cervical: No cervical adenopathy.  Skin:    General: Skin is warm and dry.  Neurological:     General: No focal deficit present.     Mental Status: He is alert.  Psychiatric:        Mood and Affect: Mood normal.     LABORATORY DATA:  I have reviewed the data as listed Lab Results  Component Value Date   WBC 6.4 11/18/2020   HGB 9.6 (L) 11/18/2020   HCT 26.9 (L) 11/18/2020   MCV 90.6 11/18/2020   PLT 157 11/18/2020     Chemistry      Component Value Date/Time   NA 125 (L) 11/18/2020 1413   NA 135 02/03/2020 1134   K 4.6 11/18/2020 1413   CL 93 (L) 11/18/2020 1413   CO2 20 (L) 11/18/2020 1413   BUN 21 11/18/2020 1413   BUN 10 02/03/2020 1134   CREATININE 1.02 11/18/2020 1413   CREATININE 1.00 05/11/2020 1127      Component Value Date/Time   CALCIUM 9.2 11/18/2020 1413   ALKPHOS 79 10/20/2020 1414   AST 22 10/20/2020 1414   ALT 15 10/20/2020 1414   BILITOT 0.7 10/20/2020 1414      RADIOGRAPHIC STUDIES: I have personally reviewed the radiological images as listed and agreed with the findings in the report. IR Gastrostomy Tube  Result Date: 11/06/2020 INDICATION: Head neck squamous cell carcinoma, feeding tube placement prior to chemo radiation therapy EXAM: FLUOROSCOPIC [place] Date:  11/06/2020 11/06/2020 10:27 am Radiologist:  M. Daryll Brod, MD Guidance:   Fluoroscopic MEDICATIONS: Ancef 2 g; Antibiotics were administered within 1 hour of the procedure. Glucagon 0.5 mg IV ANESTHESIA/SEDATION: Versed 2.0 mg IV; Fentanyl 50 mcg IV Moderate Sedation Time:  10 minutes The patient was continuously monitored during the procedure by the interventional radiology nurse under my direct supervision. CONTRAST:  62mL OMNIPAQUE IOHEXOL 300 MG/ML SOLN - administered into the gastric lumen. FLUOROSCOPY TIME:  Fluoroscopy Time: 2 minutes 54 seconds (28 mGy). COMPLICATIONS: None. PROCEDURE: Informed consent was obtained from the patient following explanation of the procedure, risks, benefits and alternatives. The patient understands, agrees and consents for the procedure. All questions were addressed. A time out was performed. Maximal barrier sterile technique utilized including caps, mask, sterile gowns, sterile gloves, large sterile drape, hand hygiene, and betadine prep. The left upper quadrant was sterilely prepped and draped. An oral gastric catheter was inserted into the stomach under fluoroscopy. The existing nasogastric feeding tube was removed. Air was injected into the stomach for insufflation and visualization under fluoroscopy. The air distended stomach was confirmed beneath the anterior abdominal wall in the frontal and lateral projections. Under sterile conditions and local anesthesia, a 64 gauge trocar needle was utilized to access the stomach percutaneously beneath the left subcostal margin. Needle position was confirmed within the stomach under biplane fluoroscopy. Contrast injection confirmed position  also. A single T tack was deployed for gastropexy. Over an Amplatz guide wire, a 9-French sheath was inserted into the stomach. A snare device was utilized to capture the oral gastric catheter. The snare device was pulled retrograde from the stomach up the esophagus and out the oropharynx. The 20-French pull-through gastrostomy was connected to the snare device and  pulled antegrade through the oropharynx down the esophagus into the stomach and then through the percutaneous tract external to the patient. The gastrostomy was assembled externally. Contrast injection confirms position in the stomach. Images were obtained for documentation. The patient tolerated procedure well. No immediate complication. IMPRESSION: Fluoroscopic insertion of a 20-French "pull-through" gastrostomy. Electronically Signed   By: Jerilynn Mages.  Shick M.D.   On: 11/06/2020 10:37   IR IMAGING GUIDED PORT INSERTION  Result Date: 11/06/2020 CLINICAL DATA:  Head neck squamous cell carcinoma, access for chemotherapy EXAM: RIGHT INTERNAL JUGULAR SINGLE LUMEN POWER PORT CATHETER INSERTION Date:  11/06/2020 11/06/2020 10:28 am Radiologist:  M. Daryll Brod, MD Guidance:  Ultrasound and fluoroscopic MEDICATIONS: Ancef 2 g for both procedures; The antibiotic was administered within an appropriate time interval prior to skin puncture. ANESTHESIA/SEDATION: Versed 1.0 mg IV; Fentanyl 50 mcg IV; Moderate Sedation Time:  27 minutes The patient was continuously monitored during the procedure by the interventional radiology nurse under my direct supervision. FLUOROSCOPY TIME:  0 minutes, 54 seconds (5 mGy) COMPLICATIONS: None immediate. CONTRAST:  None. PROCEDURE: Informed consent was obtained from the patient following explanation of the procedure, risks, benefits and alternatives. The patient understands, agrees and consents for the procedure. All questions were addressed. A time out was performed. Maximal barrier sterile technique utilized including caps, mask, sterile gowns, sterile gloves, large sterile drape, hand hygiene, and 2% chlorhexidine scrub. Under sterile conditions and local anesthesia, right internal jugular micropuncture venous access was performed. Access was performed with ultrasound. Images were obtained for documentation of the patent right internal jugular vein. A guide wire was inserted followed by a  transitional dilator. This allowed insertion of a guide wire and catheter into the IVC. Measurements were obtained from the SVC / RA junction back to the right IJ venotomy site. In the right infraclavicular chest, a subcutaneous pocket was created over the second anterior rib. This was done under sterile conditions and local anesthesia. 1% lidocaine with epinephrine was utilized for this. A 2.5 cm incision was made in the skin. Blunt dissection was performed to create a subcutaneous pocket over the right pectoralis major muscle. The pocket was flushed with saline vigorously. There was adequate hemostasis. The port catheter was assembled and checked for leakage. The port catheter was secured in the pocket with two retention sutures. The tubing was tunneled subcutaneously to the right venotomy site and inserted into the SVC/RA junction through a valved peel-away sheath. Position was confirmed with fluoroscopy. Images were obtained for documentation. The patient tolerated the procedure well. No immediate complications. Incisions were closed in a two layer fashion with 4 - 0 Vicryl suture. Dermabond was applied to the skin. The port catheter was accessed, blood was aspirated followed by saline and heparin flushes. Needle was removed. A dry sterile dressing was applied. IMPRESSION: Ultrasound and fluoroscopically guided right internal jugular single lumen power port catheter insertion. Tip in the SVC/RA junction. Catheter ready for use. Electronically Signed   By: Jerilynn Mages.  Shick M.D.   On: 11/06/2020 10:36   I have reviewed images independently, reviewed pathology reports.  Labs reviewed from today, satisfactory to proceed.  All questions  were answered. The patient knows to call the clinic with any problems, questions or concerns.    Benay Pike, MD 11/19/2020 10:23 AM

## 2020-11-19 ENCOUNTER — Ambulatory Visit
Admission: RE | Admit: 2020-11-19 | Discharge: 2020-11-19 | Disposition: A | Discharge status: Home / Self Care | Payer: Medicare Other | Source: Ambulatory Visit | Attending: Radiation Oncology | Admitting: Radiation Oncology

## 2020-11-19 ENCOUNTER — Encounter: Payer: Self-pay | Admitting: Hematology and Oncology

## 2020-11-19 ENCOUNTER — Inpatient Hospital Stay: Payer: Medicare Other

## 2020-11-19 VITALS — BP 146/74 | HR 68 | Temp 98.3°F | Resp 18

## 2020-11-19 DIAGNOSIS — K1231 Oral mucositis (ulcerative) due to antineoplastic therapy: Secondary | ICD-10-CM | POA: Diagnosis not present

## 2020-11-19 DIAGNOSIS — Z800 Family history of malignant neoplasm of digestive organs: Secondary | ICD-10-CM | POA: Diagnosis not present

## 2020-11-19 DIAGNOSIS — Z87891 Personal history of nicotine dependence: Secondary | ICD-10-CM | POA: Diagnosis not present

## 2020-11-19 DIAGNOSIS — R630 Anorexia: Secondary | ICD-10-CM | POA: Diagnosis not present

## 2020-11-19 DIAGNOSIS — K5903 Drug induced constipation: Secondary | ICD-10-CM | POA: Diagnosis not present

## 2020-11-19 DIAGNOSIS — Z5111 Encounter for antineoplastic chemotherapy: Secondary | ICD-10-CM | POA: Diagnosis not present

## 2020-11-19 DIAGNOSIS — C109 Malignant neoplasm of oropharynx, unspecified: Secondary | ICD-10-CM

## 2020-11-19 DIAGNOSIS — D6959 Other secondary thrombocytopenia: Secondary | ICD-10-CM | POA: Diagnosis not present

## 2020-11-19 DIAGNOSIS — T451X5A Adverse effect of antineoplastic and immunosuppressive drugs, initial encounter: Secondary | ICD-10-CM | POA: Diagnosis not present

## 2020-11-19 DIAGNOSIS — R5383 Other fatigue: Secondary | ICD-10-CM | POA: Diagnosis not present

## 2020-11-19 DIAGNOSIS — D649 Anemia, unspecified: Secondary | ICD-10-CM | POA: Insufficient documentation

## 2020-11-19 DIAGNOSIS — C01 Malignant neoplasm of base of tongue: Secondary | ICD-10-CM | POA: Diagnosis not present

## 2020-11-19 DIAGNOSIS — D6481 Anemia due to antineoplastic chemotherapy: Secondary | ICD-10-CM | POA: Diagnosis not present

## 2020-11-19 DIAGNOSIS — Z510 Encounter for antineoplastic radiation therapy: Secondary | ICD-10-CM | POA: Diagnosis not present

## 2020-11-19 DIAGNOSIS — Z931 Gastrostomy status: Secondary | ICD-10-CM | POA: Diagnosis not present

## 2020-11-19 DIAGNOSIS — R634 Abnormal weight loss: Secondary | ICD-10-CM | POA: Diagnosis not present

## 2020-11-19 MED ORDER — HEPARIN SOD (PORK) LOCK FLUSH 100 UNIT/ML IV SOLN
500.0000 [IU] | Freq: Once | INTRAVENOUS | Status: AC | PRN
  Administered 2020-11-19: 500 [IU]
  Filled 2020-11-19: qty 5

## 2020-11-19 MED ORDER — HEPARIN SOD (PORK) LOCK FLUSH 100 UNIT/ML IV SOLN
250.0000 [IU] | Freq: Once | INTRAVENOUS | Status: DC | PRN
  Filled 2020-11-19: qty 5

## 2020-11-19 MED ORDER — POTASSIUM CHLORIDE IN NACL 20-0.9 MEQ/L-% IV SOLN
Freq: Once | INTRAVENOUS | Status: AC
Start: 2020-11-19 — End: 2020-11-19
  Filled 2020-11-19: qty 1000

## 2020-11-19 MED ORDER — SODIUM CHLORIDE 0.9% FLUSH
10.0000 mL | INTRAVENOUS | Status: DC | PRN
  Administered 2020-11-19: 10 mL
  Filled 2020-11-19: qty 10

## 2020-11-19 MED ORDER — SODIUM CHLORIDE 0.9 % IV SOLN
150.0000 mg | Freq: Once | INTRAVENOUS | Status: AC
  Administered 2020-11-19: 150 mg via INTRAVENOUS
  Filled 2020-11-19: qty 5

## 2020-11-19 MED ORDER — ACETAMINOPHEN 325 MG PO TABS
ORAL_TABLET | ORAL | Status: AC
  Filled 2020-11-19: qty 2

## 2020-11-19 MED ORDER — ACETAMINOPHEN 325 MG PO TABS
650.0000 mg | ORAL_TABLET | Freq: Once | ORAL | Status: AC
  Administered 2020-11-19: 650 mg via ORAL

## 2020-11-19 MED ORDER — PALONOSETRON HCL INJECTION 0.25 MG/5ML
0.2500 mg | Freq: Once | INTRAVENOUS | Status: AC
  Administered 2020-11-19: 0.25 mg via INTRAVENOUS
  Filled 2020-11-19: qty 5

## 2020-11-19 MED ORDER — SODIUM CHLORIDE 0.9 % IV SOLN
Freq: Once | INTRAVENOUS | Status: AC
  Filled 2020-11-19: qty 250

## 2020-11-19 MED ORDER — SODIUM CHLORIDE 0.9 % IV SOLN
10.0000 mg | Freq: Once | INTRAVENOUS | Status: AC
  Administered 2020-11-19: 10 mg via INTRAVENOUS
  Filled 2020-11-19: qty 1

## 2020-11-19 MED ORDER — CISPLATIN CHEMO INJECTION 100MG/100ML
40.0000 mg/m2 | Freq: Once | INTRAVENOUS | Status: AC
  Administered 2020-11-19: 85 mg via INTRAVENOUS
  Filled 2020-11-19: qty 85

## 2020-11-19 MED ORDER — MAGNESIUM SULFATE 2 GM/50ML IV SOLN
2.0000 g | Freq: Once | INTRAVENOUS | Status: AC
  Administered 2020-11-19: 2 g via INTRAVENOUS
  Filled 2020-11-19: qty 50

## 2020-11-19 NOTE — Patient Instructions (Signed)
Jeffrey [name]  Discharge Instructions: Thank you for choosing [facility] to provide your oncology and hematology care.   If you have a lab appointment with the [facility], please go directly to the [facility] and check in at the registration area.   Wear comfortable clothing and clothing appropriate for easy access to any Portacath or PICC line.   We strive to give you quality time with your provider. You may need to reschedule your appointment if you arrive late (15 or more minutes).  Arriving late affects you and other patients whose appointments are after yours.  Also, if you miss three or more appointments without notifying the office, you may be dismissed from the clinic at the provider's discretion.      For prescription refill requests, have your pharmacy contact our office and allow 72 hours for refills to be completed.    Today you received the following chemotherapy and/or immunotherapy agents:  Cisplatin      To help prevent nausea and vomiting after your treatment, we encourage you to take your nausea medication as directed.  BELOW ARE SYMPTOMS THAT SHOULD BE REPORTED IMMEDIATELY: . *FEVER GREATER THAN 100.4 F (38 C) OR HIGHER . *CHILLS OR SWEATING . *NAUSEA AND VOMITING THAT IS NOT CONTROLLED WITH YOUR NAUSEA MEDICATION . *UNUSUAL SHORTNESS OF BREATH . *UNUSUAL BRUISING OR BLEEDING . *URINARY PROBLEMS (pain or burning when urinating, or frequent urination) . *BOWEL PROBLEMS (unusual diarrhea, constipation, pain near the anus) . TENDERNESS IN MOUTH AND THROAT WITH OR WITHOUT PRESENCE OF ULCERS (sore throat, sores in mouth, or a toothache) . UNUSUAL RASH, SWELLING OR PAIN  . UNUSUAL VAGINAL DISCHARGE OR ITCHING   Items with * indicate a potential emergency and should be followed up as soon as possible or go to the Emergency Department if any problems should occur.  Please show the CHEMOTHERAPY ALERT CARD or IMMUNOTHERAPY ALERT  CARD at check-in to the Emergency Department and triage nurse.  Should you have questions after your visit or need to cancel or reschedule your appointment, please contact [facility]  Dept: [phone]  and follow the prompts.  Office hours are 8:00 a.m. to 4:30 p.m. Monday - Friday. Please note that voicemails left after 4:00 p.m. may not be returned until the following business day.  We are closed weekends and major holidays. You have access to a nurse at all times for urgent questions. Please call the main number to the clinic Dept: [phone] and follow the prompts.   For any non-urgent questions, you may also contact your provider using MyChart. We now offer e-Visits for anyone 34 and older to request care online for non-urgent symptoms. For details visit [email or web address].   Also download the MyChart app! Go to the app store, search "MyChart", open the app, select [facility], and log in with your MyChart username and password.  Due to Covid, a mask is required upon entering the hospital/clinic. If you do not have a mask, one will be given to you upon arrival. For doctor visits, patients may have 1 support person aged 26 or older with them. For treatment visits, patients cannot have anyone with them due to current Covid guidelines and our immunocompromised population.    Cisplatin injection What is this medicine? CISPLATIN (SIS pla tin) is a chemotherapy drug. It targets fast dividing cells, like cancer cells, and causes these cells to die. This medicine is used to treat many types of  cancer like bladder, ovarian, and testicular cancers. This medicine may be used for other purposes; ask your health care provider or pharmacist if you have questions. COMMON BRAND NAME(S): Platinol, Platinol -AQ What should I tell my health care provider before I take this medicine? They need to know if you have any of these conditions:  eye disease, vision  problems  hearing problems  kidney disease  low blood counts, like white cells, platelets, or red blood cells  tingling of the fingers or toes, or other nerve disorder  an unusual or allergic reaction to cisplatin, carboplatin, oxaliplatin, other medicines, foods, dyes, or preservatives  pregnant or trying to get pregnant  breast-feeding How should I use this medicine? This drug is given as an infusion into a vein. It is administered in a hospital or clinic by a specially trained health care professional. Talk to your pediatrician regarding the use of this medicine in children. Special care may be needed. Overdosage: If you think you have taken too much of this medicine contact a poison control center or emergency room at once. NOTE: This medicine is only for you. Do not share this medicine with others. What if I miss a dose? It is important not to miss a dose. Call your doctor or health care professional if you are unable to keep an appointment. What may interact with this medicine? This medicine may interact with the following medications:  foscarnet  certain antibiotics like amikacin, gentamicin, neomycin, polymyxin B, streptomycin, tobramycin, vancomycin This list may not describe all possible interactions. Give your health care provider a list of all the medicines, herbs, non-prescription drugs, or dietary supplements you use. Also tell them if you smoke, drink alcohol, or use illegal drugs. Some items may interact with your medicine. What should I watch for while using this medicine? Your condition will be monitored carefully while you are receiving this medicine. You will need important blood work done while you are taking this medicine. This drug may make you feel generally unwell. This is not uncommon, as chemotherapy can affect healthy cells as well as cancer cells. Report any side effects. Continue your course of treatment even though you feel ill unless your doctor tells  you to stop. This medicine may increase your risk of getting an infection. Call your healthcare professional for advice if you get a fever, chills, or sore throat, or other symptoms of a cold or flu. Do not treat yourself. Try to avoid being around people who are sick. Avoid taking medicines that contain aspirin, acetaminophen, ibuprofen, naproxen, or ketoprofen unless instructed by your healthcare professional. These medicines may hide a fever. This medicine may increase your risk to bruise or bleed. Call your doctor or health care professional if you notice any unusual bleeding. Be careful brushing and flossing your teeth or using a toothpick because you may get an infection or bleed more easily. If you have any dental work done, tell your dentist you are receiving this medicine. Do not become pregnant while taking this medicine or for 14 months after stopping it. Women should inform their healthcare professional if they wish to become pregnant or think they might be pregnant. Men should not father a child while taking this medicine and for 11 months after stopping it. There is potential for serious side effects to an unborn child. Talk to your healthcare professional for more information. Do not breast-feed an infant while taking this medicine. This medicine has caused ovarian failure in some women. This medicine may  make it more difficult to get pregnant. Talk to your healthcare professional if you are concerned about your fertility. This medicine has caused decreased sperm counts in some men. This may make it more difficult to father a child. Talk to your healthcare professional if you are concerned about your fertility. Drink fluids as directed while you are taking this medicine. This will help protect your kidneys. Call your doctor or health care professional if you get diarrhea. Do not treat yourself. What side effects may I notice from receiving this medicine? Side effects that you should report  to your doctor or health care professional as soon as possible:  allergic reactions like skin rash, itching or hives, swelling of the face, lips, or tongue  blurred vision  changes in vision  decreased hearing or ringing of the ears  nausea, vomiting  pain, redness, or irritation at site where injected  pain, tingling, numbness in the hands or feet  signs and symptoms of bleeding such as bloody or black, tarry stools; red or dark brown urine; spitting up blood or brown material that looks like coffee grounds; red spots on the skin; unusual bruising or bleeding from the eyes, gums, or nose  signs and symptoms of infection like fever; chills; cough; sore throat; pain or trouble passing urine  signs and symptoms of kidney injury like trouble passing urine or change in the amount of urine  signs and symptoms of low red blood cells or anemia such as unusually weak or tired; feeling faint or lightheaded; falls; breathing problems Side effects that usually do not require medical attention (report to your doctor or health care professional if they continue or are bothersome):  loss of appetite  mouth sores  muscle cramps This list may not describe all possible side effects. Call your doctor for medical advice about side effects. You may report side effects to FDA at 1-800-FDA-1088. Where should I keep my medicine? This drug is given in a hospital or clinic and will not be stored at home. NOTE: This sheet is a summary. It may not cover all possible information. If you have questions about this medicine, talk to your doctor, pharmacist, or health care provider.  2021 [employer] Standard (2018-06-01 15:59:17)

## 2020-11-19 NOTE — Assessment & Plan Note (Signed)
Patient has some baseline anemia for years.  This has worsened in the past couple weeks likely secondary to chemotherapy.  No indication for transfusion.  We will continue to monitor.

## 2020-11-19 NOTE — Progress Notes (Signed)
Per Dr. Chryl Heck ok to run post hydration fluids with cisplatin.

## 2020-11-19 NOTE — Assessment & Plan Note (Signed)
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 He is doing well except for some fatigue, loss of appetite.  No mucositis noted.  No nausea, vomiting, neuropathy or ototoxicity reported. Physical examination today, tired appearing male patient with no acute distress.  No other major findings. Labs reviewed and satisfactory to proceed.

## 2020-11-20 ENCOUNTER — Ambulatory Visit
Admission: RE | Admit: 2020-11-20 | Discharge: 2020-11-20 | Disposition: A | Discharge status: Home / Self Care | Payer: Medicare Other | Source: Ambulatory Visit | Attending: Radiation Oncology | Admitting: Radiation Oncology

## 2020-11-20 ENCOUNTER — Other Ambulatory Visit: Payer: Self-pay

## 2020-11-20 DIAGNOSIS — Z87891 Personal history of nicotine dependence: Secondary | ICD-10-CM | POA: Diagnosis not present

## 2020-11-20 DIAGNOSIS — C01 Malignant neoplasm of base of tongue: Secondary | ICD-10-CM | POA: Diagnosis not present

## 2020-11-20 DIAGNOSIS — C109 Malignant neoplasm of oropharynx, unspecified: Secondary | ICD-10-CM | POA: Diagnosis not present

## 2020-11-20 DIAGNOSIS — Z510 Encounter for antineoplastic radiation therapy: Secondary | ICD-10-CM | POA: Diagnosis not present

## 2020-11-21 ENCOUNTER — Other Ambulatory Visit: Payer: Self-pay

## 2020-11-21 DIAGNOSIS — I739 Peripheral vascular disease, unspecified: Secondary | ICD-10-CM

## 2020-11-21 DIAGNOSIS — Z9889 Other specified postprocedural states: Secondary | ICD-10-CM

## 2020-11-21 DIAGNOSIS — I714 Abdominal aortic aneurysm, without rupture, unspecified: Secondary | ICD-10-CM

## 2020-11-23 ENCOUNTER — Other Ambulatory Visit: Payer: Self-pay

## 2020-11-23 ENCOUNTER — Ambulatory Visit
Admission: RE | Admit: 2020-11-23 | Discharge: 2020-11-23 | Disposition: A | Discharge status: Home / Self Care | Payer: Medicare Other | Source: Ambulatory Visit | Attending: Radiation Oncology | Admitting: Radiation Oncology

## 2020-11-23 DIAGNOSIS — Z87891 Personal history of nicotine dependence: Secondary | ICD-10-CM | POA: Diagnosis not present

## 2020-11-23 DIAGNOSIS — C01 Malignant neoplasm of base of tongue: Secondary | ICD-10-CM | POA: Diagnosis not present

## 2020-11-23 DIAGNOSIS — C109 Malignant neoplasm of oropharynx, unspecified: Secondary | ICD-10-CM | POA: Diagnosis not present

## 2020-11-23 DIAGNOSIS — Z510 Encounter for antineoplastic radiation therapy: Secondary | ICD-10-CM | POA: Diagnosis not present

## 2020-11-24 ENCOUNTER — Ambulatory Visit
Admission: RE | Admit: 2020-11-24 | Discharge: 2020-11-24 | Disposition: A | Discharge status: Home / Self Care | Payer: Medicare Other | Source: Ambulatory Visit | Attending: Radiation Oncology | Admitting: Radiation Oncology

## 2020-11-24 DIAGNOSIS — Z510 Encounter for antineoplastic radiation therapy: Secondary | ICD-10-CM | POA: Diagnosis not present

## 2020-11-24 DIAGNOSIS — Z87891 Personal history of nicotine dependence: Secondary | ICD-10-CM | POA: Diagnosis not present

## 2020-11-24 DIAGNOSIS — C01 Malignant neoplasm of base of tongue: Secondary | ICD-10-CM | POA: Diagnosis not present

## 2020-11-24 DIAGNOSIS — C109 Malignant neoplasm of oropharynx, unspecified: Secondary | ICD-10-CM | POA: Diagnosis not present

## 2020-11-25 ENCOUNTER — Ambulatory Visit
Admission: RE | Admit: 2020-11-25 | Discharge: 2020-11-25 | Disposition: A | Discharge status: Home / Self Care | Payer: Medicare Other | Source: Ambulatory Visit | Attending: Radiation Oncology | Admitting: Radiation Oncology

## 2020-11-25 DIAGNOSIS — C01 Malignant neoplasm of base of tongue: Secondary | ICD-10-CM | POA: Diagnosis not present

## 2020-11-25 DIAGNOSIS — Z87891 Personal history of nicotine dependence: Secondary | ICD-10-CM | POA: Diagnosis not present

## 2020-11-25 DIAGNOSIS — C109 Malignant neoplasm of oropharynx, unspecified: Secondary | ICD-10-CM | POA: Diagnosis not present

## 2020-11-25 DIAGNOSIS — Z510 Encounter for antineoplastic radiation therapy: Secondary | ICD-10-CM | POA: Diagnosis not present

## 2020-11-26 ENCOUNTER — Other Ambulatory Visit: Payer: Self-pay

## 2020-11-26 ENCOUNTER — Inpatient Hospital Stay: Payer: Medicare Other

## 2020-11-26 ENCOUNTER — Inpatient Hospital Stay: Payer: Medicare Other | Admitting: Nutrition

## 2020-11-26 ENCOUNTER — Inpatient Hospital Stay (HOSPITAL_BASED_OUTPATIENT_CLINIC_OR_DEPARTMENT_OTHER): Payer: Medicare Other | Admitting: Hematology and Oncology

## 2020-11-26 ENCOUNTER — Encounter: Payer: Self-pay | Admitting: Hematology and Oncology

## 2020-11-26 ENCOUNTER — Ambulatory Visit
Admission: RE | Admit: 2020-11-26 | Discharge: 2020-11-26 | Disposition: A | Discharge status: Home / Self Care | Payer: Medicare Other | Source: Ambulatory Visit | Attending: Radiation Oncology | Admitting: Radiation Oncology

## 2020-11-26 ENCOUNTER — Other Ambulatory Visit: Payer: Medicare Other

## 2020-11-26 ENCOUNTER — Encounter: Payer: Medicare Other | Admitting: Nutrition

## 2020-11-26 VITALS — BP 134/66 | HR 74 | Temp 98.2°F | Resp 18 | Ht 70.0 in | Wt 193.2 lb

## 2020-11-26 DIAGNOSIS — D649 Anemia, unspecified: Secondary | ICD-10-CM

## 2020-11-26 DIAGNOSIS — R5383 Other fatigue: Secondary | ICD-10-CM | POA: Diagnosis not present

## 2020-11-26 DIAGNOSIS — K5903 Drug induced constipation: Secondary | ICD-10-CM

## 2020-11-26 DIAGNOSIS — R630 Anorexia: Secondary | ICD-10-CM | POA: Diagnosis not present

## 2020-11-26 DIAGNOSIS — C109 Malignant neoplasm of oropharynx, unspecified: Secondary | ICD-10-CM

## 2020-11-26 DIAGNOSIS — K1231 Oral mucositis (ulcerative) due to antineoplastic therapy: Secondary | ICD-10-CM | POA: Diagnosis not present

## 2020-11-26 DIAGNOSIS — R634 Abnormal weight loss: Secondary | ICD-10-CM | POA: Diagnosis not present

## 2020-11-26 DIAGNOSIS — Z95828 Presence of other vascular implants and grafts: Secondary | ICD-10-CM

## 2020-11-26 DIAGNOSIS — Z510 Encounter for antineoplastic radiation therapy: Secondary | ICD-10-CM | POA: Diagnosis not present

## 2020-11-26 DIAGNOSIS — Z931 Gastrostomy status: Secondary | ICD-10-CM | POA: Diagnosis not present

## 2020-11-26 DIAGNOSIS — T451X5A Adverse effect of antineoplastic and immunosuppressive drugs, initial encounter: Secondary | ICD-10-CM | POA: Diagnosis not present

## 2020-11-26 DIAGNOSIS — Z800 Family history of malignant neoplasm of digestive organs: Secondary | ICD-10-CM | POA: Diagnosis not present

## 2020-11-26 DIAGNOSIS — Z5111 Encounter for antineoplastic chemotherapy: Secondary | ICD-10-CM | POA: Diagnosis not present

## 2020-11-26 DIAGNOSIS — C01 Malignant neoplasm of base of tongue: Secondary | ICD-10-CM | POA: Diagnosis not present

## 2020-11-26 DIAGNOSIS — D6481 Anemia due to antineoplastic chemotherapy: Secondary | ICD-10-CM | POA: Diagnosis not present

## 2020-11-26 DIAGNOSIS — Z87891 Personal history of nicotine dependence: Secondary | ICD-10-CM | POA: Diagnosis not present

## 2020-11-26 DIAGNOSIS — D6959 Other secondary thrombocytopenia: Secondary | ICD-10-CM | POA: Diagnosis not present

## 2020-11-26 LAB — BASIC METABOLIC PANEL - CANCER CENTER ONLY
Anion gap: 8 (ref 5–15)
BUN: 16 mg/dL (ref 8–23)
CO2: 23 mmol/L (ref 22–32)
Calcium: 9.1 mg/dL (ref 8.9–10.3)
Chloride: 95 mmol/L — ABNORMAL LOW (ref 98–111)
Creatinine: 0.98 mg/dL (ref 0.61–1.24)
GFR, Estimated: >60 mL/min (ref >60)
Glucose, Bld: 106 mg/dL — ABNORMAL HIGH (ref 70–99)
Potassium: 4.4 mmol/L (ref 3.5–5.1)
Sodium: 126 mmol/L — ABNORMAL LOW (ref 135–145)

## 2020-11-26 LAB — CBC WITH DIFFERENTIAL (CANCER CENTER ONLY)
Abs Immature Granulocytes: 0.06 10*3/uL (ref 0.00–0.07)
Basophils Absolute: 0 10*3/uL (ref 0.0–0.1)
Basophils Relative: 0 %
Eosinophils Absolute: 0 10*3/uL (ref 0.0–0.5)
Eosinophils Relative: 0 %
HCT: 24.8 % — ABNORMAL LOW (ref 39.0–52.0)
Hemoglobin: 9.1 g/dL — ABNORMAL LOW (ref 13.0–17.0)
Immature Granulocytes: 1 %
Lymphocytes Relative: 6 %
Lymphs Abs: 0.3 10*3/uL — ABNORMAL LOW (ref 0.7–4.0)
MCH: 32.5 pg (ref 26.0–34.0)
MCHC: 36.7 g/dL — ABNORMAL HIGH (ref 30.0–36.0)
MCV: 88.6 fL (ref 80.0–100.0)
Monocytes Absolute: 0.8 10*3/uL (ref 0.1–1.0)
Monocytes Relative: 18 %
Neutro Abs: 3.5 10*3/uL (ref 1.7–7.7)
Neutrophils Relative %: 75 %
Platelet Count: 74 10*3/uL — ABNORMAL LOW (ref 150–400)
RBC: 2.8 MIL/uL — ABNORMAL LOW (ref 4.22–5.81)
RDW: 13 % (ref 11.5–15.5)
WBC Count: 4.7 10*3/uL (ref 4.0–10.5)
nRBC: 0 % (ref 0.0–0.2)

## 2020-11-26 LAB — MAGNESIUM: Magnesium: 1.5 mg/dL — ABNORMAL LOW (ref 1.7–2.4)

## 2020-11-26 MED ORDER — SODIUM CHLORIDE 0.9% FLUSH
10.0000 mL | Freq: Once | INTRAVENOUS | Status: AC
  Administered 2020-11-26: 10 mL
  Filled 2020-11-26: qty 10

## 2020-11-26 MED ORDER — MAGNESIUM SULFATE 2 GM/50ML IV SOLN
INTRAVENOUS | Status: AC
  Filled 2020-11-26: qty 50

## 2020-11-26 MED ORDER — MAGNESIUM SULFATE 2 GM/50ML IV SOLN
2.0000 g | Freq: Once | INTRAVENOUS | Status: AC
  Administered 2020-11-26: 2 g via INTRAVENOUS

## 2020-11-26 MED ORDER — HEPARIN SOD (PORK) LOCK FLUSH 100 UNIT/ML IV SOLN
500.0000 [IU] | Freq: Once | INTRAVENOUS | Status: AC
Start: 2020-11-26 — End: 2020-11-26
  Administered 2020-11-26: 500 [IU]
  Filled 2020-11-26: qty 5

## 2020-11-26 MED ORDER — SODIUM CHLORIDE 0.9 % IV SOLN
Freq: Once | INTRAVENOUS | Status: DC
  Filled 2020-11-26: qty 250

## 2020-11-26 MED ORDER — SODIUM CHLORIDE 0.9 % IV SOLN
Freq: Once | INTRAVENOUS | Status: AC
  Filled 2020-11-26: qty 250

## 2020-11-26 MED ORDER — MAGNESIUM SULFATE 2 GM/50ML IV SOLN: 2.0000 g | Freq: Once | INTRAVENOUS | Status: DC

## 2020-11-26 NOTE — Progress Notes (Signed)
[facility] NOTE  Patient Care Team: Vivi Barrack, MD as PCP - General (Family Medicine) Buford Dresser, MD as PCP - Cardiology (Cardiology) Chevis Pretty as Consulting Physician (Dentistry) [name], MD as Consulting Physician (Vascular Surgery) [name], [name], PA-C as Physician Assistant (Dermatology) Malmfelt, Stephani Police, RN as Oncology Nurse Navigator Eppie Gibson, MD as Consulting Physician (Radiation Oncology) Leta Baptist, MD as Consulting Physician (Otolaryngology)  CHIEF COMPLAINTS/PURPOSE OF CONSULTATION:  Oropharyngeal cancer.  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 He is feeling tired compared to last week, no appetite, nothing tastes good, lost a little bit of weight and has noticed some sore mouth. Physical examination, mild mucositis, otherwise no major concerns.  We have reviewed labs which showed thrombocytopenia with a platelet count of 74,000 hence elected to defer the cycle. We will proceed with chemotherapy next week as planned.  Normocytic normochromic anemia He had some baseline anemia prior to initiation of chemotherapy which has progressed during chemo.  Will arrange for blood transfusion if hemoglobin is less than 8.  Mucositis due to antineoplastic therapy Mild mucositis noted on exam today along with some sore mouth reported.  He can continue and try the viscous lidocaine for now.  If he has progression of mucositis and no relief with viscous lidocaine, we will try to give him liquid hydrocodone.  Drug induced constipation Drug-induced constipation likely related to nausea medication.  He can take stool softeners as needed for now.  We will continue to monitor.  Hypomagnesemia This is from cisplatin which can cause wasting  of Magnesium Will replace Magnesium IV  No orders of the defined types were placed in this encounter.  HISTORY OF PRESENTING ILLNESS:   Nesanel Aguila 80 y.o. male is here because of new diagnosis of oropharyngeal cancer.   Oncology History Overview Note  Patient first noticed to have neck mass about 2 months prior to presentation. He denies any complaints except for neck mass, no difficulty swallowing, pain at the base of the tongue. He went to see his PCP who recommended an [place] which demonstrated hypoechoic mass at the right level II a highly concerning for an enlarged pathologic LN. He was referred to ENT and CT neck was ordered. CT neck showed enlarged right level 2 node, asymmetric soft tissue at the right tongue base, favored to reflect asymmetric tonsillar issue. 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 PET CT showed primary mucosal neoplasm in the right tongue base area demonstrating hypermetabolism.    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   He has lost about 3 pounds of weight.  He has been eating small meals a couple times a day or so, starting to use the G tube, His mouth is sore, has not tried viscous lidocaine. He is able to swallow soft and liquid foods. No change in breathing, bowel habits.   Occasional constipation relieved by stool softeners No new neurological complaints. No change in urinary habits. Rest of the pertinent 10 point ROS reviewed and negative.  MEDICAL HISTORY:  Past Medical History:  Diagnosis Date  . AAA (abdominal aortic aneurysm) ([place])   . Allergy   . Arthritis   . Colon polyps   . Coronary artery calcification   . Eczematous dermatitis   . GERD (gastroesophageal reflux disease)   . Hypertension   . Iliac artery stenosis, left ([place])   . 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 Osteopenia     SURGICAL HISTORY: Past Surgical History:  Procedure Laterality Date  . ABDOMINAL AORTIC ENDOVASCULAR FENESTRATED STENT GRAFT N/A 03/21/2018   Procedure: ABDOMINAL AORTIC ENDOVASCULAR FENESTRATED STENT GRAFT ; [place] CT PERFORMED;  Surgeon: [name], MD;  Location: [facility];  Service: Vascular;  Laterality: N/A;  . Basal Skin Cancer  2012  . CATARACT EXTRACTION Bilateral   . IR GASTROSTOMY TUBE MOD SED  11/06/2020  . IR IMAGING GUIDED PORT INSERTION  11/06/2020  . MASS BIOPSY Right 09/25/2020   Procedure: OPEN NECK MASS BIOPSY;  Surgeon: Leta Baptist, MD;  Location: [facility];  Service: ENT;  Laterality: Right;  . TONSILLECTOMY    . TRANSURETHRAL RESECTION OF PROSTATE      SOCIAL HISTORY: Social History   Socioeconomic History  . Marital status: Divorced    Spouse name: Not on file  . Number of children: 1  . Years of education: Not on file  . Highest education level: Not on file  Occupational History  . Occupation: Retired    Fish farm manager: [employer]  Tobacco Use  . Smoking status: Former    Packs/day: 1.00    Years: 32.00    Pack years: 32.00    Types: Cigarettes    Quit date: 08/17/1990    Years since quitting: 30.2  . Smokeless tobacco: Never  Vaping Use  . Vaping Use: Never used  Substance and Sexual Activity  . Alcohol use: Yes    Comment: Occassionally, "when I feel like it"  . Drug  use: No  . Sexual activity: Yes    Partners: Female  Other Topics Concern  . Not on file  Social History Narrative  . Not on file   Social Determinants of Health   Financial Resource Strain: Low Risk   . Difficulty of Paying Living Expenses: Not hard at all  Food Insecurity: No Food Insecurity  . Worried About Charity fundraiser in the Last Year: Never true  . Ran Out of Food in the Last Year: Never true  Transportation Needs: No Transportation Needs  . Lack of Transportation (Medical): No  . Lack of Transportation (Non-Medical): No  Physical Activity: Insufficiently Active  . Days of Exercise per Week: 5 days  . Minutes of Exercise per Session: 20 min  Stress: No Stress Concern Present  . Feeling of Stress : Not at all  Social Connections: Moderately Isolated  . Frequency of Communication with Friends and Family: Once a week  . Frequency of Social Gatherings with Friends and Family: Twice a week  . Attends Religious Services: Never  . Active Member of Clubs  or Organizations: Yes  . Attends Archivist Meetings: 1 to 4 times per year  . Marital Status: Divorced  Human resources officer Violence: Not At Risk  . Fear of Current or Ex-Partner: No  . Emotionally Abused: No  . Physically Abused: No  . Sexually Abused: No    FAMILY HISTORY: Family History  Problem Relation Age of Onset  . Diabetes Mother   . Hypertension Mother   . Stroke Mother   . Alcohol abuse Father   . Heart attack Father   . Heart disease Father   . Hypertension Father   . Cancer Sister   . Hyperlipidemia Sister   . Pancreatic cancer Sister   . Pancreatic cancer Brother   . Hypertension Son     ALLERGIES:  is allergic to ketotifen fumarate, mixed ragweed, and triamcinolone.  MEDICATIONS:  Current Outpatient Medications  Medication Sig Dispense Refill  . acetaminophen (TYLENOL) 650 MG CR tablet Take 1,300 mg by mouth every 8 (eight) hours as needed for pain.    Marland Kitchen amLODipine (NORVASC) 5 MG  tablet Take 1 tablet (5 mg total) by mouth daily. 90 tablet 3  . ARTIFICIAL TEAR SOLUTION OP Place 1 drop into both eyes daily as needed (dry eyes).    . Ascorbic Acid (VITAMIN C) 1000 MG tablet Take 1,000 mg by mouth daily.     Marland Kitchen aspirin EC 81 MG tablet Take 81 mg by mouth daily. Swallow whole.    Marland Kitchen atorvastatin (LIPITOR) 40 MG tablet TAKE 1 TABLET(40 MG) BY MOUTH DAILY AT 6 PM (Patient taking differently: Take 40 mg by mouth daily at 6 PM.) 90 tablet 1  . Calcium Carbonate-Vitamin D 600-200 MG-UNIT TABS Take 1 tablet by mouth every other day.     . Cyanocobalamin 1000 MCG TBCR Take 1,000 mcg by mouth daily.     . Emollient (CERAVE) LOTN Apply 1 application topically daily as needed (after shower care).     . ferrous sulfate 325 (65 FE) MG tablet Take 325 mg by mouth every other day.    . fluticasone (FLONASE) 50 MCG/ACT nasal spray Place 1 spray into both nostrils every other day.    Marland Kitchen GLUCOSAMINE-CHONDROITIN PO Take 1 tablet by mouth 2 (two) times daily.     Marland Kitchen lidocaine (XYLOCAINE) 2 % solution Patient: Mix 1part 2% viscous lidocaine, 1part H20. Swish & swallow 55mL of diluted mixture, 62min before meals and at bedtime, up to QID 200 mL 3  . lidocaine-prilocaine (EMLA) cream Apply 1 application topically as needed (Utilize prior to port-a-cath needle access). 30 g 3  . LORazepam (ATIVAN) 0.5 MG tablet Take 1 tablet (0.5 mg total) by mouth every 6 (six) hours as needed (Nausea or vomiting). 30 tablet 0  . losartan (COZAAR) 25 MG tablet TAKE 1 TABLET(25 MG) BY MOUTH TWICE DAILY (Patient taking differently: Take 25 mg by mouth 2 (two) times daily.) 180 tablet 3  . Multiple Vitamins-Minerals (CENTRUM ADULTS PO) Take 1 tablet by mouth daily.     . Omega-3 1000 MG CAPS Take 1,000 mg by mouth 2 (two) times daily.    . ondansetron (ZOFRAN) 8 MG tablet Take 1 tablet (8 mg total) by mouth 2 (two) times daily as needed. Start on the third day after cisplatin chemotherapy. 30 tablet 1  . prochlorperazine  (COMPAZINE) 10 MG tablet Take 1 tablet (10 mg total) by mouth every 6 (six) hours as needed (Nausea or vomiting). 30 tablet 1   No current facility-administered medications for  this visit.    PHYSICAL EXAMINATION: ECOG PERFORMANCE STATUS: 0 - Asymptomatic  Vitals:   11/26/20 0843  BP: 134/66  Pulse: 74  Resp: 18  Temp: 98.2 F (36.8 C)  SpO2: 100%   Filed Weights   11/26/20 0843  Weight: 193 lb 3.2 oz (87.6 kg)    Physical Exam Constitutional:      Appearance: Normal appearance.  HENT:     Head: Normocephalic and atraumatic.     Mouth/Throat:     Pharynx: Posterior oropharyngeal erythema (mild mucositis noted) present. No oropharyngeal exudate.  Cardiovascular:     Rate and Rhythm: Normal rate and regular rhythm.     Pulses: Normal pulses.     Heart sounds: Normal heart sounds.  Pulmonary:     Effort: Pulmonary effort is normal.     Breath sounds: Normal breath sounds.  Abdominal:     General: Abdomen is flat. Bowel sounds are normal.     Comments: G tube appears well   Musculoskeletal:        General: No swelling or tenderness. Normal range of motion.     Cervical back: Normal range of motion. No rigidity.  Lymphadenopathy:     Cervical: No cervical adenopathy.  Skin:    General: Skin is warm and dry.  Neurological:     General: No focal deficit present.     Mental Status: He is alert.  Psychiatric:        Mood and Affect: Mood normal.    LABORATORY DATA:  I have reviewed the data as listed Lab Results  Component Value Date   WBC 4.7 11/26/2020   HGB 9.1 (L) 11/26/2020   HCT 24.8 (L) 11/26/2020   MCV 88.6 11/26/2020   PLT 74 (L) 11/26/2020     Chemistry      Component Value Date/Time   NA 126 (L) 11/26/2020 0819   NA 135 02/03/2020 1134   K 4.4 11/26/2020 0819   CL 95 (L) 11/26/2020 0819   CO2 23 11/26/2020 0819   BUN 16 11/26/2020 0819   BUN 10 02/03/2020 1134   CREATININE 0.98 11/26/2020 0819   CREATININE 1.00 05/11/2020 1127       Component Value Date/Time   CALCIUM 9.1 11/26/2020 0819   ALKPHOS 79 10/20/2020 1414   AST 22 10/20/2020 1414   ALT 15 10/20/2020 1414   BILITOT 0.7 10/20/2020 1414      RADIOGRAPHIC STUDIES: I have personally reviewed the radiological images as listed and agreed with the findings in the report. IR Gastrostomy Tube  Result Date: 11/06/2020 INDICATION: Head neck squamous cell carcinoma, feeding tube placement prior to chemo radiation therapy EXAM: FLUOROSCOPIC [place] Date:  11/06/2020 11/06/2020 10:27 am Radiologist:  M. Daryll Brod, MD Guidance:  Fluoroscopic MEDICATIONS: Ancef 2 g; Antibiotics were administered within 1 hour of the procedure. Glucagon 0.5 mg IV ANESTHESIA/SEDATION: Versed 2.0 mg IV; Fentanyl 50 mcg IV Moderate Sedation Time:  10 minutes The patient was continuously monitored during the procedure by the interventional radiology nurse under my direct supervision. CONTRAST:  16mL OMNIPAQUE IOHEXOL 300 MG/ML SOLN - administered into the gastric lumen. FLUOROSCOPY TIME:  Fluoroscopy Time: 2 minutes 54 seconds (28 mGy). COMPLICATIONS: None. PROCEDURE: Informed consent was obtained from the patient following explanation of the procedure, risks, benefits and alternatives. The patient understands, agrees and consents for the procedure. All questions were addressed. A time out was performed. Maximal barrier sterile technique utilized including caps, mask, sterile gowns, sterile gloves,  large sterile drape, hand hygiene, and betadine prep. The left upper quadrant was sterilely prepped and draped. An oral gastric catheter was inserted into the stomach under fluoroscopy. The existing nasogastric feeding tube was removed. Air was injected into the stomach for insufflation and visualization under fluoroscopy. The air distended stomach was confirmed beneath the anterior abdominal wall in the frontal and lateral projections. Under sterile conditions and local anesthesia, a  51 gauge trocar needle was utilized to access the stomach percutaneously beneath the left subcostal margin. Needle position was confirmed within the stomach under biplane fluoroscopy. Contrast injection confirmed position also. A single T tack was deployed for gastropexy. Over an Amplatz guide wire, a 9-French sheath was inserted into the stomach. A snare device was utilized to capture the oral gastric catheter. The snare device was pulled retrograde from the stomach up the esophagus and out the oropharynx. The 20-French pull-through gastrostomy was connected to the snare device and pulled antegrade through the oropharynx down the esophagus into the stomach and then through the percutaneous tract external to the patient. The gastrostomy was assembled externally. Contrast injection confirms position in the stomach. Images were obtained for documentation. The patient tolerated procedure well. No immediate complication. IMPRESSION: Fluoroscopic insertion of a 20-French "pull-through" gastrostomy. Electronically Signed   By: Jerilynn Mages.  Shick M.D.   On: 11/06/2020 10:37   IR IMAGING GUIDED PORT INSERTION  Result Date: 11/06/2020 CLINICAL DATA:  Head neck squamous cell carcinoma, access for chemotherapy EXAM: RIGHT INTERNAL JUGULAR SINGLE LUMEN POWER PORT CATHETER INSERTION Date:  11/06/2020 11/06/2020 10:28 am Radiologist:  M. Daryll Brod, MD Guidance:  Ultrasound and fluoroscopic MEDICATIONS: Ancef 2 g for both procedures; The antibiotic was administered within an appropriate time interval prior to skin puncture. ANESTHESIA/SEDATION: Versed 1.0 mg IV; Fentanyl 50 mcg IV; Moderate Sedation Time:  27 minutes The patient was continuously monitored during the procedure by the interventional radiology nurse under my direct supervision. FLUOROSCOPY TIME:  0 minutes, 54 seconds (5 mGy) COMPLICATIONS: None immediate. CONTRAST:  None. PROCEDURE: Informed consent was obtained from the patient following explanation of the procedure,  risks, benefits and alternatives. The patient understands, agrees and consents for the procedure. All questions were addressed. A time out was performed. Maximal barrier sterile technique utilized including caps, mask, sterile gowns, sterile gloves, large sterile drape, hand hygiene, and 2% chlorhexidine scrub. Under sterile conditions and local anesthesia, right internal jugular micropuncture venous access was performed. Access was performed with ultrasound. Images were obtained for documentation of the patent right internal jugular vein. A guide wire was inserted followed by a transitional dilator. This allowed insertion of a guide wire and catheter into the IVC. Measurements were obtained from the SVC / RA junction back to the right IJ venotomy site. In the right infraclavicular chest, a subcutaneous pocket was created over the second anterior rib. This was done under sterile conditions and local anesthesia. 1% lidocaine with epinephrine was utilized for this. A 2.5 cm incision was made in the skin. Blunt dissection was performed to create a subcutaneous pocket over the right pectoralis major muscle. The pocket was flushed with saline vigorously. There was adequate hemostasis. The port catheter was assembled and checked for leakage. The port catheter was secured in the pocket with two retention sutures. The tubing was tunneled subcutaneously to the right venotomy site and inserted into the SVC/RA junction through a valved peel-away sheath. Position was confirmed with fluoroscopy. Images were obtained for documentation. The patient tolerated the procedure well. No immediate complications.  Incisions were closed in a two layer fashion with 4 - 0 Vicryl suture. Dermabond was applied to the skin. The port catheter was accessed, blood was aspirated followed by saline and heparin flushes. Needle was removed. A dry sterile dressing was applied. IMPRESSION: Ultrasound and fluoroscopically guided right internal jugular  single lumen power port catheter insertion. Tip in the SVC/RA junction. Catheter ready for use. Electronically Signed   By: Jerilynn Mages.  Shick M.D.   On: 11/06/2020 10:36     All questions were answered. The patient knows to call the clinic with any problems, questions or concerns. Labs reviewed, 9.1.  Creatinine is normal.  Hypomagnesemia will try to replace magnesium     Benay Pike, MD 11/26/2020 9:42 AM

## 2020-11-26 NOTE — Assessment & Plan Note (Signed)
Mild mucositis noted on exam today along with some sore mouth reported.  He can continue and try the viscous lidocaine for now.  If he has progression of mucositis and no relief with viscous lidocaine, we will try to give him liquid hydrocodone.

## 2020-11-26 NOTE — Assessment & Plan Note (Signed)
This is from cisplatin which can cause wasting of Magnesium Will replace Magnesium IV

## 2020-11-26 NOTE — Assessment & Plan Note (Signed)
Drug-induced constipation likely related to nausea medication.  He can take stool softeners as needed for now.  We will continue to monitor.

## 2020-11-26 NOTE — Progress Notes (Signed)
Nutrition follow-up completed with patient during infusion for oropharyngeal cancer.  He receives concurrent chemoradiation therapy with cisplatin.  He is status post feeding tube and port placement.  Weight documented as 193 pounds on June 9 decreased from 203.7 pounds May 3. Patient reports decreased appetite and taste alterations.  He experienced constipation but is now taking stool softeners regularly.  He has mild mucositis. Reports he has been giving 2 ounces of Osmolite 1.5 several times a day.  He denies issues with tolerance.  He is agreeable to starting tube feeding regularly.  Estimated nutrition needs: 2000-2300 cal, 110-125 g protein, 2.3 L fluid.  Nutrition diagnosis: Food and nutrition related knowledge deficit continues.  Intervention: Continue to eat orally as tolerated.  Try to eat 3 meals a day.  Begin 1 carton Osmolite 1.5 twice daily between meals at 3 PM and bedtime.  Flush with 60 mL free water before and after bolus feeding.  Give tube feeding slowly over 15 minutes. Also provided samples of [facility] vanilla 1.4 which patient can consume by mouth or use via tube.  Patient will try the Glendo via tube.  I will follow-up with patient tomorrow to determine which formula patient tolerates best.  Orders will be written.  Monitoring, evaluation, goals: Patient will tolerate adequate calories and protein to minimize weight loss.  Next visit: Follow-up by telephone on Friday, June 10.  We will also follow-up with patient next Thursday, June 16 during infusion.  **Disclaimer: This note was dictated with voice recognition software. Similar sounding words can inadvertently be transcribed and this note may contain transcription errors which may not have been corrected upon publication of note.**

## 2020-11-26 NOTE — Assessment & Plan Note (Signed)
He had some baseline anemia prior to initiation of chemotherapy which has progressed during chemo.  Will arrange for blood transfusion if hemoglobin is less than 8.

## 2020-11-26 NOTE — Patient Instructions (Signed)
Dehydration, Adult Dehydration is condition in which there is not enough water or other fluids in the body. This happens when a person loses more fluids than he or she takes in. Important body parts cannot work right without the right amount of fluids. Any loss of fluids from the body can cause dehydration. Dehydration can be mild, worse, or very bad. It should be treated right away to keep it from getting very bad. What are the causes? This condition may be caused by:  Conditions that cause loss of water or other fluids, such as: ? Watery poop (diarrhea). ? Vomiting. ? Sweating a lot. ? Peeing (urinating) a lot.  Not drinking enough fluids, especially when you: ? Are ill. ? Are doing things that take a lot of energy to do.  Other illnesses and conditions, such as fever or infection.  Certain medicines, such as medicines that take extra fluid out of the body (diuretics).  Lack of safe drinking water.  Not being able to get enough water and food. What increases the risk? The following factors may make you more likely to develop this condition:  Having a long-term (chronic) illness that has not been treated the right way, such as: ? Diabetes. ? Heart disease. ? Kidney disease.  Being 65 years of age or older.  Having a disability.  Living in a place that is high above the ground or sea (high in altitude). The thinner, dried air causes more fluid loss.  Doing exercises that put stress on your body for a long time. What are the signs or symptoms? Symptoms of dehydration depend on how bad it is. Mild or worse dehydration  Thirst.  Dry lips or dry mouth.  Feeling dizzy or light-headed, especially when you stand up from sitting.  Muscle cramps.  Your body making: ? Dark pee (urine). Pee may be the color of tea. ? Less pee than normal. ? Less tears than normal.  Headache. Very bad dehydration  Changes in skin. Skin may: ? Be cold to the touch (clammy). ? Be blotchy  or pale. ? Not go back to normal right after you lightly pinch it and let it go.  Little or no tears, pee, or sweat.  Changes in vital signs, such as: ? Fast breathing. ? Low blood pressure. ? Weak pulse. ? Pulse that is more than 100 beats a minute when you are sitting still.  Other changes, such as: ? Feeling very thirsty. ? Eyes that look hollow (sunken). ? Cold hands and feet. ? Being mixed up (confused). ? Being very tired (lethargic) or having trouble waking from sleep. ? Short-term weight loss. ? Loss of consciousness. How is this treated? Treatment for this condition depends on how bad it is. Treatment should start right away. Do not wait until your condition gets very bad. Very bad dehydration is an emergency. You will need to go to a hospital.  Mild or worse dehydration can be treated at home. You may be asked to: ? Drink more fluids. ? Drink an oral rehydration solution (ORS). This drink helps get the right amounts of fluids and salts and minerals in the blood (electrolytes).  Very bad dehydration can be treated: ? With fluids through an IV tube. ? By getting normal levels of salts and minerals in your blood. This is often done by giving salts and minerals through a tube. The tube is passed through your nose and into your stomach. ? By treating the root cause. Follow these instructions at   home: Oral rehydration solution If told by your doctor, drink an ORS:  Make an ORS. Use instructions on the package.  Start by drinking small amounts, about  cup (120 mL) every 5-10 minutes.  Slowly drink more until you have had the amount that your doctor said to have. Eating and drinking  Drink enough clear fluid to keep your pee pale yellow. If you were told to drink an ORS, finish the ORS first. Then, start slowly drinking other clear fluids. Drink fluids such as: ? Water. Do not drink only water. Doing that can make the salt (sodium) level in your body get too low. ? Water  from ice chips you suck on. ? Fruit juice that you have added water to (diluted). ? Low-calorie sports drinks.  Eat foods that have the right amounts of salts and minerals, such as: ? Bananas. ? Oranges. ? Potatoes. ? Tomatoes. ? Spinach.  Do not drink alcohol.  Avoid: ? Drinks that have a lot of sugar. These include:  High-calorie sports drinks.  Fruit juice that you did not add water to.  Soda.  Caffeine. ? Foods that are greasy or have a lot of fat or sugar.         General instructions  Take over-the-counter and prescription medicines only as told by your doctor.  Do not take salt tablets. Doing that can make the salt level in your body get too high.  Return to your normal activities as told by your doctor. Ask your doctor what activities are safe for you.  Keep all follow-up visits as told by your doctor. This is important. Contact a doctor if:  You have pain in your belly (abdomen) and the pain: ? Gets worse. ? Stays in one place.  You have a rash.  You have a stiff neck.  You get angry or annoyed (irritable) more easily than normal.  You are more tired or have a harder time waking than normal.  You feel: ? Weak or dizzy. ? Very thirsty. Get help right away if you have:  Any symptoms of very bad dehydration.  Symptoms of vomiting, such as: ? You cannot eat or drink without vomiting. ? Your vomiting gets worse or does not go away. ? Your vomit has blood or green stuff in it.  Symptoms that get worse with treatment.  A fever.  A very bad headache.  Problems with peeing or pooping (having a bowel movement), such as: ? Watery poop that gets worse or does not go away. ? Blood in your poop (stool). This may cause poop to look black and tarry. ? Not peeing in 6-8 hours. ? Peeing only a small amount of very dark pee in 6-8 hours.  Trouble breathing. These symptoms may be an emergency. Do not wait to see if the symptoms will go away. Get  medical help right away. Call your local emergency services (911 in the [place] Do not drive yourself to the hospital. Summary  Dehydration is a condition in which there is not enough water or other fluids in the body. This happens when a person loses more fluids than he or she takes in.  Treatment for this condition depends on how bad it is. Treatment should be started right away. Do not wait until your condition gets very bad.  Drink enough clear fluid to keep your pee pale yellow. If you were told to drink an oral rehydration solution (ORS), finish the ORS first. Then, start slowly drinking other clear fluids.    Take over-the-counter and prescription medicines only as told by your doctor.  Get help right away if you have any symptoms of very bad dehydration. This information is not intended to replace advice given to you by your health care provider. Make sure you discuss any questions you have with your health care provider. Document Revised: 01/17/2019 Document Reviewed: 01/17/2019 Elsevier Patient Education  2021 Elsevier Inc.  

## 2020-11-26 NOTE — Assessment & Plan Note (Signed)
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 He is feeling tired compared to last week, no appetite, nothing tastes good, lost a little bit of weight and has noticed some sore mouth. Physical examination, mild mucositis, otherwise no major concerns.  We have reviewed labs which showed thrombocytopenia with a platelet count of 74,000 hence elected to defer the cycle. We will proceed with chemotherapy next week as planned.

## 2020-11-27 ENCOUNTER — Ambulatory Visit
Admission: RE | Admit: 2020-11-27 | Discharge: 2020-11-27 | Disposition: A | Discharge status: Home / Self Care | Payer: Medicare Other | Source: Ambulatory Visit | Attending: Radiation Oncology | Admitting: Radiation Oncology

## 2020-11-27 ENCOUNTER — Telehealth: Payer: Self-pay | Admitting: Nutrition

## 2020-11-27 DIAGNOSIS — C01 Malignant neoplasm of base of tongue: Secondary | ICD-10-CM | POA: Diagnosis not present

## 2020-11-27 DIAGNOSIS — C109 Malignant neoplasm of oropharynx, unspecified: Secondary | ICD-10-CM | POA: Diagnosis not present

## 2020-11-27 DIAGNOSIS — Z87891 Personal history of nicotine dependence: Secondary | ICD-10-CM | POA: Diagnosis not present

## 2020-11-27 DIAGNOSIS — Z510 Encounter for antineoplastic radiation therapy: Secondary | ICD-10-CM | POA: Diagnosis not present

## 2020-11-27 NOTE — Telephone Encounter (Signed)
Telephone follow up completed with patient. He reports he infused one carton of [employer] 1.4 yesterday afternoon with good tolerance. Today, he ate a Education officer, museum breakfast sandwich and infused one carton of [employer] 1.4 and one carton of Osmolite 1.5 and is going to eat dinner. Plans are to infuse one more carton of Osmolite 1.5 tonight. He denied any tolerance issues with either product. He is concerned he will not tolerate goal of either tube feeding.  Encouraged patient to continue to infuse either tube feeding throughout the weekend in addition to foods by mouth. I will order an additional sample case of [employer] 1.4. Follow up next week to evaluate formula tolerance. Patient appreciative.

## 2020-11-30 ENCOUNTER — Other Ambulatory Visit: Payer: Self-pay

## 2020-11-30 ENCOUNTER — Ambulatory Visit
Admission: RE | Admit: 2020-11-30 | Discharge: 2020-11-30 | Disposition: A | Discharge status: Home / Self Care | Payer: Medicare Other | Source: Ambulatory Visit | Attending: Radiation Oncology | Admitting: Radiation Oncology

## 2020-11-30 DIAGNOSIS — Z510 Encounter for antineoplastic radiation therapy: Secondary | ICD-10-CM | POA: Diagnosis not present

## 2020-11-30 DIAGNOSIS — C01 Malignant neoplasm of base of tongue: Secondary | ICD-10-CM | POA: Diagnosis not present

## 2020-11-30 DIAGNOSIS — C109 Malignant neoplasm of oropharynx, unspecified: Secondary | ICD-10-CM | POA: Diagnosis not present

## 2020-11-30 DIAGNOSIS — Z87891 Personal history of nicotine dependence: Secondary | ICD-10-CM | POA: Diagnosis not present

## 2020-12-01 ENCOUNTER — Ambulatory Visit
Admission: RE | Admit: 2020-12-01 | Discharge: 2020-12-01 | Disposition: A | Discharge status: Home / Self Care | Payer: Medicare Other | Source: Ambulatory Visit | Attending: Radiation Oncology | Admitting: Radiation Oncology

## 2020-12-01 DIAGNOSIS — C109 Malignant neoplasm of oropharynx, unspecified: Secondary | ICD-10-CM | POA: Diagnosis not present

## 2020-12-01 DIAGNOSIS — Z510 Encounter for antineoplastic radiation therapy: Secondary | ICD-10-CM | POA: Diagnosis not present

## 2020-12-01 DIAGNOSIS — C01 Malignant neoplasm of base of tongue: Secondary | ICD-10-CM | POA: Diagnosis not present

## 2020-12-01 DIAGNOSIS — Z87891 Personal history of nicotine dependence: Secondary | ICD-10-CM | POA: Diagnosis not present

## 2020-12-02 ENCOUNTER — Ambulatory Visit
Admission: RE | Admit: 2020-12-02 | Discharge: 2020-12-02 | Disposition: A | Discharge status: Home / Self Care | Payer: Medicare Other | Source: Ambulatory Visit | Attending: Radiation Oncology | Admitting: Radiation Oncology

## 2020-12-02 DIAGNOSIS — C109 Malignant neoplasm of oropharynx, unspecified: Secondary | ICD-10-CM | POA: Diagnosis not present

## 2020-12-02 DIAGNOSIS — C01 Malignant neoplasm of base of tongue: Secondary | ICD-10-CM | POA: Diagnosis not present

## 2020-12-02 DIAGNOSIS — Z510 Encounter for antineoplastic radiation therapy: Secondary | ICD-10-CM | POA: Diagnosis not present

## 2020-12-02 DIAGNOSIS — Z87891 Personal history of nicotine dependence: Secondary | ICD-10-CM | POA: Diagnosis not present

## 2020-12-03 ENCOUNTER — Encounter: Payer: Self-pay | Admitting: Hematology and Oncology

## 2020-12-03 ENCOUNTER — Inpatient Hospital Stay: Payer: Medicare Other

## 2020-12-03 ENCOUNTER — Encounter: Payer: Self-pay | Admitting: Physical Therapy

## 2020-12-03 ENCOUNTER — Inpatient Hospital Stay: Payer: Medicare Other | Admitting: Nutrition

## 2020-12-03 ENCOUNTER — Ambulatory Visit: Payer: Medicare Other | Attending: Radiation Oncology | Admitting: Physical Therapy

## 2020-12-03 ENCOUNTER — Other Ambulatory Visit: Payer: Self-pay

## 2020-12-03 ENCOUNTER — Encounter: Payer: Medicare Other | Admitting: Nutrition

## 2020-12-03 ENCOUNTER — Other Ambulatory Visit: Payer: Medicare Other

## 2020-12-03 ENCOUNTER — Inpatient Hospital Stay: Payer: Medicare Other | Admitting: Hematology and Oncology

## 2020-12-03 ENCOUNTER — Ambulatory Visit
Admission: RE | Admit: 2020-12-03 | Discharge: 2020-12-03 | Disposition: A | Discharge status: Home / Self Care | Payer: Medicare Other | Source: Ambulatory Visit | Attending: Radiation Oncology | Admitting: Radiation Oncology

## 2020-12-03 VITALS — BP 145/60 | HR 74 | Temp 97.4°F | Resp 19 | Ht 70.0 in | Wt 193.1 lb

## 2020-12-03 DIAGNOSIS — D6959 Other secondary thrombocytopenia: Secondary | ICD-10-CM | POA: Diagnosis not present

## 2020-12-03 DIAGNOSIS — K5903 Drug induced constipation: Secondary | ICD-10-CM | POA: Diagnosis not present

## 2020-12-03 DIAGNOSIS — Z87891 Personal history of nicotine dependence: Secondary | ICD-10-CM | POA: Diagnosis not present

## 2020-12-03 DIAGNOSIS — C01 Malignant neoplasm of base of tongue: Secondary | ICD-10-CM | POA: Diagnosis not present

## 2020-12-03 DIAGNOSIS — R1310 Dysphagia, unspecified: Secondary | ICD-10-CM | POA: Diagnosis not present

## 2020-12-03 DIAGNOSIS — Z510 Encounter for antineoplastic radiation therapy: Secondary | ICD-10-CM | POA: Diagnosis not present

## 2020-12-03 DIAGNOSIS — D6481 Anemia due to antineoplastic chemotherapy: Secondary | ICD-10-CM | POA: Diagnosis not present

## 2020-12-03 DIAGNOSIS — C109 Malignant neoplasm of oropharynx, unspecified: Secondary | ICD-10-CM

## 2020-12-03 DIAGNOSIS — Z931 Gastrostomy status: Secondary | ICD-10-CM

## 2020-12-03 DIAGNOSIS — K1231 Oral mucositis (ulcerative) due to antineoplastic therapy: Secondary | ICD-10-CM | POA: Diagnosis not present

## 2020-12-03 DIAGNOSIS — R634 Abnormal weight loss: Secondary | ICD-10-CM | POA: Diagnosis not present

## 2020-12-03 DIAGNOSIS — T451X5A Adverse effect of antineoplastic and immunosuppressive drugs, initial encounter: Secondary | ICD-10-CM | POA: Diagnosis not present

## 2020-12-03 DIAGNOSIS — R5383 Other fatigue: Secondary | ICD-10-CM | POA: Diagnosis not present

## 2020-12-03 DIAGNOSIS — Z800 Family history of malignant neoplasm of digestive organs: Secondary | ICD-10-CM | POA: Diagnosis not present

## 2020-12-03 DIAGNOSIS — Z95828 Presence of other vascular implants and grafts: Secondary | ICD-10-CM

## 2020-12-03 DIAGNOSIS — R630 Anorexia: Secondary | ICD-10-CM | POA: Diagnosis not present

## 2020-12-03 DIAGNOSIS — R293 Abnormal posture: Secondary | ICD-10-CM | POA: Diagnosis not present

## 2020-12-03 DIAGNOSIS — Z5111 Encounter for antineoplastic chemotherapy: Secondary | ICD-10-CM | POA: Diagnosis not present

## 2020-12-03 LAB — CBC WITH DIFFERENTIAL (CANCER CENTER ONLY)
Abs Immature Granulocytes: 0.02 10*3/uL (ref 0.00–0.07)
Basophils Absolute: 0 10*3/uL (ref 0.0–0.1)
Basophils Relative: 0 %
Eosinophils Absolute: 0 10*3/uL (ref 0.0–0.5)
Eosinophils Relative: 0 %
HCT: 23 % — ABNORMAL LOW (ref 39.0–52.0)
Hemoglobin: 8.2 g/dL — ABNORMAL LOW (ref 13.0–17.0)
Immature Granulocytes: 1 %
Lymphocytes Relative: 12 %
Lymphs Abs: 0.3 10*3/uL — ABNORMAL LOW (ref 0.7–4.0)
MCH: 32.5 pg (ref 26.0–34.0)
MCHC: 35.7 g/dL (ref 30.0–36.0)
MCV: 91.3 fL (ref 80.0–100.0)
Monocytes Absolute: 0.6 10*3/uL (ref 0.1–1.0)
Monocytes Relative: 26 %
Neutro Abs: 1.5 10*3/uL — ABNORMAL LOW (ref 1.7–7.7)
Neutrophils Relative %: 61 %
Platelet Count: 38 10*3/uL — ABNORMAL LOW (ref 150–400)
RBC: 2.52 MIL/uL — ABNORMAL LOW (ref 4.22–5.81)
RDW: 13.3 % (ref 11.5–15.5)
WBC Count: 2.5 10*3/uL — ABNORMAL LOW (ref 4.0–10.5)
nRBC: 0 % (ref 0.0–0.2)

## 2020-12-03 LAB — BASIC METABOLIC PANEL - CANCER CENTER ONLY
Anion gap: 9 (ref 5–15)
BUN: 23 mg/dL (ref 8–23)
CO2: 26 mmol/L (ref 22–32)
Calcium: 8.9 mg/dL (ref 8.9–10.3)
Chloride: 95 mmol/L — ABNORMAL LOW (ref 98–111)
Creatinine: 1.02 mg/dL (ref 0.61–1.24)
GFR, Estimated: >60 mL/min (ref >60)
Glucose, Bld: 84 mg/dL (ref 70–99)
Potassium: 4.6 mmol/L (ref 3.5–5.1)
Sodium: 130 mmol/L — ABNORMAL LOW (ref 135–145)

## 2020-12-03 LAB — MAGNESIUM: Magnesium: 1.9 mg/dL (ref 1.7–2.4)

## 2020-12-03 MED ORDER — SODIUM CHLORIDE 0.9% FLUSH
10.0000 mL | Freq: Once | INTRAVENOUS | Status: AC
  Administered 2020-12-03: 10 mL
  Filled 2020-12-03: qty 10

## 2020-12-03 MED ORDER — HEPARIN SOD (PORK) LOCK FLUSH 100 UNIT/ML IV SOLN
500.0000 [IU] | Freq: Once | INTRAVENOUS | Status: AC
Start: 2020-12-03 — End: 2020-12-03
  Administered 2020-12-03: 500 [IU]
  Filled 2020-12-03: qty 5

## 2020-12-03 NOTE — Therapy (Signed)
[place], [place], [address] Phone: [phone]   Fax:  [phone]  Physical Therapy Evaluation  Patient Details  Name: Jeffrey [name] MRN: [id number] Date of Birth: 1941-05-30 Referring Provider (PT): Isidore Moos   Encounter Date: 12/03/2020   PT End of Session - 12/03/20 1107     Visit Number 1    Number of Visits 2    Date for PT Re-Evaluation 01/28/21    PT Start Time 1026    PT Stop Time 1055    PT Time Calculation (min) 29 min    Activity Tolerance Patient tolerated treatment well    Behavior During Therapy [facility] for tasks assessed/performed             Past Medical History:  Diagnosis Date   AAA (abdominal aortic aneurysm) ([place])    Allergy    Arthritis    Colon polyps    Coronary artery calcification    Eczematous dermatitis    GERD (gastroesophageal reflux disease)    Hypertension    Iliac artery stenosis, left (HCC)    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   Right Buccal Cheek([facility])   Osteopenia     Past Surgical History:  Procedure Laterality Date   ABDOMINAL AORTIC ENDOVASCULAR FENESTRATED STENT GRAFT N/A 03/21/2018   Procedure: ABDOMINAL AORTIC ENDOVASCULAR FENESTRATED STENT GRAFT ; [facility] PERFORMED;  Surgeon: [name], MD;  Location: [facility];  Service: Vascular;  Laterality: N/A;   Basal Skin Cancer  2012   CATARACT EXTRACTION Bilateral    IR GASTROSTOMY TUBE MOD SED  11/06/2020   IR IMAGING GUIDED PORT INSERTION  11/06/2020   MASS BIOPSY Right 09/25/2020   Procedure: OPEN NECK MASS BIOPSY;  Surgeon: Leta Baptist, MD;  Location: [facility];  Service: ENT;  Laterality: Right;   TONSILLECTOMY     TRANSURETHRAL RESECTION OF PROSTATE      There were no vitals filed for this visit.    Subjective Assessment - 12/03/20 1027     Subjective Before this I was walking 1/2 mile to a mile a day. I couldn't get chemo today because my platelets were low.     Pertinent History Squamous cell carcinoma of oropharynx, Stage 1, 09/11/20 CT neck showed an enlarged right level 2 node. There was also noted to be asymmetric soft tissue at the right tongue base that was favored to reflect asymmetric tonsillar tissue, 10/05/20 excision of the right neck mass revealed  poorly differentiated squamous cell carcinoma. Tumor is p16 +  The nodule is mostly squamous cell carcinoma with a small  amount of lymphoid tissue at the periphery, presumably residual lymph node.  As the tumor has mostly replaced the presumed lymph node, there is almost certainly extra-nodal extension, the extent of which cannot be accurately measured due to the marked paucity of possible residual lymph node, 10/05/20 PET revealed primary mucosal neoplasm in the right tongue base area demonstrating hypermetabolism. The patient was status post excisional biopsy of the enlarged right level 2 lymph node and there were no other enlarged or hypermetabolic neck nodes. There were no PET-CT findings for metastatic disease involving the chest, abdomen, pelvis, or osseous structures, will receive 35 fractions of radiation to his Oropharynx and bilateral neck with weekly cisplatin. He started on 5/23 and will complete 12/29/20.    Patient Stated Goals to gain info from provider    Currently in Pain? Yes    Pain Score 3  Pain Location Throat    Pain Orientation Medial    Pain Descriptors / Indicators Sore    Pain Type Acute pain    Pain Onset 1 to 4 weeks ago    Pain Frequency Intermittent    Aggravating Factors  swallowing    Pain Relieving Factors lidocaine    Effect of Pain on Daily Activities no effect                [facility] PT Assessment - 12/03/20 0001       Assessment   Medical Diagnosis cancer of base of tongue and oropharynx    Referring Provider (PT) Squire    Onset Date/Surgical Date 09/11/20    Hand Dominance Right    Prior Therapy Dec 2021 went to PT for back and neck pain       Precautions   Precautions Other (comment)    Precaution Comments active cancer      Restrictions   Weight Bearing Restrictions No      Balance Screen   Has the patient fallen in the past 6 months No    Has the patient had a decrease in activity level because of a fear of falling?  No    Is the patient reluctant to leave their home because of a fear of falling?  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   Overall Cognitive Status Within Functional Limits for tasks assessed      Functional Tests   Functional tests Sit to Stand      Sit to Stand   Comments 30 sec sit to stand: 9 reps avg is 11 for his age      Posture/Postural Control   Posture/Postural Control Postural limitations    Postural Limitations Rounded Shoulders;Forward head;Increased thoracic kyphosis      ROM / Strength   AROM / PROM / Strength AROM      AROM   Overall AROM Comments R shoulder ROM WFL, L is 25% limited - pt reports he has pain in L shoulder that began around 3 weeks ago and he thinks push ups or something else he did aggrevated it    AROM Assessment Site Cervical    Cervical Flexion WFL    Cervical Extension WFl    Cervical - Right Side Bend WFL    Cervical - Left Side Bend WFL    Cervical - Right Rotation WFL    Cervical - Left Rotation [facility]      Ambulation/Gait   Ambulation/Gait Yes    Ambulation/Gait Assistance 7: Independent    Ambulation Distance (Feet) 10 Feet    Gait Pattern Within Functional Limits    Ambulation Surface Level               LYMPHEDEMA/ONCOLOGY QUESTIONNAIRE - 12/03/20 0001       Lymphedema Assessments   Lymphedema Assessments Head and Neck      Head and Neck   4 cm  superior to sternal notch around neck 42 cm    6 cm superior to sternal notch around neck 42.4 cm  8 cm superior to sternal notch around neck 43 cm                     Objective measurements completed on examination: See above findings.               PT Education - 12/03/20 1107     Education Details Neck ROM, importance of posture when sitting, standing and lying down, deep breathing, walking program and importance of staying active throughout treatment, CURE article on staying active, "Why exercise?" flyer, lymphedema and PT info    Person(s) Educated Patient    Methods Explanation;Handout    Comprehension Verbalized understanding                 PT Long Term Goals - 12/03/20 1106       PT LONG TERM GOAL #1   Title Pt will demonstrate baseline cervical ROM and not demonstrate any signs or symptoms of lymphedema.    Time 8    Period Weeks    Status New    Target Date 01/28/21                Head and Neck Clinic Goals - 12/03/20 1106       Patient will be able to verbalize understanding of a home exercise program for cervical range of motion, posture, and walking.          Time 1    Period Days    Status Achieved      Patient will be able to verbalize understanding of proper sitting and standing posture.          Time 1    Period Days    Status Achieved      Patient will be able to verbalize understanding of lymphedema risk and availability of treatment for this condition.          Time 1    Period Days    Status Achieved                Plan - 12/03/20 1102     Clinical Impression Statement Pt reports to PT with recently diagnosed base of tongue and oropharynx cancer. He is undergoing chemo and radiation. His neck ROM is WFL and his R shoulder ROM is WFL is L is 25% limited per visual estimate. Pt reports he has had pain in his left shoulder for 3 weeks and feels he may have injured it trying to do pushups. Pt was walking  0.5 to 1 mile per day prior to diagnosis. Educated pt about signs and symptoms of lymphedema as well as anatomy and physiology of lymphatic system. Educated pt in importance of staying as active as possible throughout treatment to decrease fatigue as well as head and neck ROM exercises to decrease loss of ROM. Will see pt after completion of radiation to reassess ROM and assess for lymphedema, assess L shoulder pain and to determine therapy needs at that time.    Stability/Clinical Decision Making Stable/Uncomplicated    Clinical Decision Making Low    Rehab Potential Good    PT Frequency --   eval and 1 f/u visit   PT Treatment/Interventions ADLs/Self Care Home Management;Therapeutic exercise;Patient/family education    PT Next Visit Plan reassess baselines, how is L shoulder ROM and pain    PT Home Exercise Plan head and neck ROM exercises    Consulted and Agree with Plan of Care Patient  Patient will benefit from skilled therapeutic intervention in order to improve the following deficits and impairments:  Pain, Postural dysfunction, Decreased knowledge of precautions  Visit Diagnosis: Abnormal posture  Oropharyngeal carcinoma ([place])     Problem List Patient Active Problem List   Diagnosis Date Noted   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   Mucositis due to antineoplastic therapy 11/26/2020   Drug induced constipation 11/26/2020   Hypomagnesemia 11/26/2020   Normocytic normochromic anemia 11/19/2020   Port-A-Cath in place 11/09/2020   History of drug allergy 11/09/2020   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[place]) 10/21/2020   Malignant neoplasm of base of tongue ([place]) 10/21/2020   Hyperglycemia 04/09/2020   Chronic neck pain 04/09/2020   Coronary artery disease due to calcified coronary lesion 12/27/2018   Osteopenia 02/24/2017   Abdominal aortic aneurysm (AAA) without rupture ([place]) 07/06/2015   Basal cell carcinoma 07/06/2015   Essential  hypertension 07/06/2015   History of colon polyps 07/06/2015   HLD (hyperlipidemia) 07/06/2015    Allyson Sabal [facility] 12/03/2020, 11:08 AM  [place], [place], [address] Phone: [phone]   Fax:  [phone]  Name: Armour Villanueva MRN: [id number] Date of Birth: 1940-09-10  Manus Gunning, PT 12/03/20 11:08 AM

## 2020-12-03 NOTE — Assessment & Plan Note (Signed)
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 He feels well except for some mild fatigue, some difficulty swallowing and mild throat pain.  He otherwise is trying to eat 2000 kcal a day on most of the days, uses G-tube as well as eats by mouth.  No other concerning symptoms. Physical examination today, healthy appearing male patient with no major concerns except for mucositis. I reviewed the labs which showed worsening thrombocytopenia platelet count down from 74,000-38000 today along with some anemia.  White blood cell count of 2500 however adequate neutrophils Given severe thrombocytopenia, unfortunately we cannot proceed with planned chemotherapy today.  We will omit the cycle of chemo and anticipate treatment next week.  I have also dose reduce his cisplatin for better tolerance in the future.

## 2020-12-03 NOTE — Assessment & Plan Note (Signed)
Mild mucositis at this time.  He does not think he needs any pain medicine.  He has been using the Biotene rinses and lidocaine as needed for pain management.  He is still able to swallow most of his food but also continues to use G-tube.

## 2020-12-03 NOTE — Assessment & Plan Note (Signed)
Severe thrombocytopenia likely secondary to chemotherapy administration.  Platelet count today is 38,000, not adequate to proceed with treatment.  We will skip this week of chemotherapy and anticipate next cycle of chemotherapy next week.  No bleeding complications, no indication for platelet transfusion today.

## 2020-12-03 NOTE — Progress Notes (Signed)
Oncology Nurse Navigator Documentation   I met with Jeffrey [name] before his scheduled appointment today with Allyson Sabal PT. He was also able to meet with Dory Peru RD. He will meet with Garald Balding at his outpatient clinic on 12/08/20. He is tolerating radiation treatment but chemotherapy has been cancelled twice due to abnormal lab values. Jeffrey [name] knows to call me with any questions or concerns as needed.   Harlow Asa RN, BSN, OCN Head & Neck Oncology Nurse Foreman at [facility] Phone # [phone]  Fax # [phone]

## 2020-12-03 NOTE — Progress Notes (Signed)
Per Dr. Chryl Heck - patient will not be receiving chemotherapy treatment today d/t abnormal lab values. Patient aware of the change in treatment plan and patient's port deaccessed.  Infusion made aware.

## 2020-12-03 NOTE — Progress Notes (Signed)
[place] NOTE  Patient Care Team: Vivi Barrack, MD as PCP - General (Family Medicine) Buford Dresser, MD as PCP - Cardiology (Cardiology) Chevis Pretty as Consulting Physician (Dentistry) [name], MD as Consulting Physician (Vascular Surgery) [name], [name], PA-C as Physician Assistant (Dermatology) Malmfelt, Stephani Police, RN as Oncology Nurse Navigator Eppie Gibson, MD as Consulting Physician (Radiation Oncology) Leta Baptist, MD as Consulting Physician (Otolaryngology)  CHIEF COMPLAINTS/PURPOSE OF CONSULTATION:  Oropharyngeal cancer.  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 He feels well except for some mild fatigue, some difficulty swallowing and mild throat pain.  He otherwise is trying to eat 2000 kcal a day on most of the days, uses G-tube as well as eats by mouth.  No other concerning symptoms. Physical examination today, healthy appearing male patient with no major concerns except for mucositis. I reviewed the labs which showed worsening thrombocytopenia platelet count down from 74,000-38000 today along with some anemia.  White blood cell count of 2500 however adequate neutrophils Given severe thrombocytopenia, unfortunately we cannot proceed with planned chemotherapy today.  We will omit the cycle of chemo and anticipate treatment next week.  I have also dose reduce his cisplatin for better tolerance in the future.  Mucositis due to antineoplastic therapy Mild mucositis at this time.  He does not think he needs any pain medicine.  He has been using the Biotene rinses and lidocaine as needed for pain management.  He is still able to swallow most of his food but also continues to use G-tube.  Chemotherapy-induced thrombocytopenia Severe  thrombocytopenia likely secondary to chemotherapy administration.  Platelet count today is 38,000, not adequate to proceed with treatment.  We will skip this week of chemotherapy and anticipate next cycle of chemotherapy next week.  No bleeding complications, no indication for platelet transfusion today.  No orders of the defined types were placed in this encounter.  HISTORY OF PRESENTING ILLNESS:   Jeffrey [name] 81 y.o. male is here because of new diagnosis of oropharyngeal cancer.   Oncology History Overview Note  Patient first noticed to have neck mass about 2 months prior to presentation. He denies any complaints except for neck mass, no difficulty swallowing, pain at the base of the tongue. He went to see his PCP who recommended an [place] which demonstrated hypoechoic mass at the right level II a highly concerning for an enlarged pathologic LN. He was referred to ENT and CT neck was ordered. CT neck showed enlarged right level 2 node, asymmetric soft tissue at the right tongue base, favored to reflect asymmetric tonsillar issue. 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 PET CT showed primary mucosal neoplasm in the right tongue base area demonstrating hypermetabolism.    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  He is feeling well except for some fatigue, some sore  throat and some difficulty swallowing.  He is still able to eat most of his foods.  He continues to have the radiation related skin rash and he is applying the cream given to him by radiation.  Constipation is intermittent.  He is trying to get at least 2000 kcal/day on most of the days.  He eats by mouth and also uses his G-tube.  Pain is not intense enough in the back of his throat to warrant pain medicine.  He is understandably worried that he is not able to receive chemotherapy on time.  Rest of the pertinent 10 point ROS reviewed and negative.   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   Right Buccal Cheek([facility])   Osteopenia     SURGICAL HISTORY: Past Surgical History:  Procedure Laterality Date   ABDOMINAL AORTIC ENDOVASCULAR FENESTRATED STENT GRAFT N/A 03/21/2018   Procedure: ABDOMINAL AORTIC ENDOVASCULAR FENESTRATED STENT GRAFT ; [facility] PERFORMED;  Surgeon: [name], MD;  Location: [facility] OR;  Service: Vascular;  Laterality: N/A;   Basal Skin Cancer  2012   CATARACT EXTRACTION Bilateral    IR GASTROSTOMY TUBE MOD SED  11/06/2020   IR IMAGING GUIDED PORT INSERTION  11/06/2020   MASS BIOPSY Right 09/25/2020   Procedure: OPEN NECK MASS BIOPSY;  Surgeon: Leta Baptist, MD;  Location: [facility];  Service: ENT;  Laterality: Right;   TONSILLECTOMY     TRANSURETHRAL RESECTION OF PROSTATE      SOCIAL HISTORY: Social History   Socioeconomic History   Marital status: Divorced    Spouse name: Not on file   Number of children: 1   Years of education: Not on file   Highest education level: Not on file  Occupational History   Occupation: Retired    Fish farm manager: [employer]  Tobacco Use    Smoking status: Former    Packs/day: 1.00    Years: 32.00    Pack years: 32.00    Types: Cigarettes    Quit date: 08/17/1990    Years since quitting: 30.3   Smokeless tobacco: Never  Vaping Use   Vaping Use: Never used  Substance and Sexual Activity   Alcohol use: Yes    Comment: Occassionally, "when I feel like it"   Drug use: No   Sexual activity: Yes    Partners: Female  Other Topics Concern   Not on file  Social History Narrative   Not on file   Social Determinants of Health   Financial Resource Strain: Low Risk    Difficulty of Paying Living Expenses: Not hard at all  Food Insecurity: No Food Insecurity   Worried About Charity fundraiser in the Last Year: Never true   Arboriculturist in the Last Year: Never true  Transportation Needs: No Transportation Needs   Lack of Transportation (Medical): No   Lack of Transportation (Non-Medical): No  Physical Activity: Insufficiently Active   Days of Exercise per  Week: 5 days   Minutes of Exercise per Session: 20 min  Stress: No Stress Concern Present   Feeling of Stress : Not at all  Social Connections: Moderately Isolated   Frequency of Communication with Friends and Family: Once a week   Frequency of Social Gatherings with Friends and Family: Twice a week   Attends Religious Services: Never   Marine scientist or Organizations: Yes   Attends Music therapist: 1 to 4 times per year   Marital Status: Divorced  Human resources officer Violence: Not At Risk   Fear of Current or Ex-Partner: No   Emotionally Abused: No   Physically Abused: No   Sexually Abused: No    FAMILY HISTORY: Family History  Problem Relation Age of Onset   Diabetes Mother    Hypertension Mother    Stroke Mother    Alcohol abuse Father    Heart attack Father    Heart disease Father    Hypertension Father    Cancer Sister    Hyperlipidemia Sister    Pancreatic cancer Sister    Pancreatic cancer Brother    Hypertension Son      ALLERGIES:  is allergic to ketotifen fumarate, mixed ragweed, and triamcinolone.  MEDICATIONS:  Current Outpatient Medications  Medication Sig Dispense Refill   acetaminophen (TYLENOL) 650 MG CR tablet Take 1,300 mg by mouth every 8 (eight) hours as needed for pain.     amLODipine (NORVASC) 5 MG tablet Take 1 tablet (5 mg total) by mouth daily. 90 tablet 3   ARTIFICIAL TEAR SOLUTION OP Place 1 drop into both eyes daily as needed (dry eyes).     aspirin EC 81 MG tablet Take 81 mg by mouth daily. Swallow whole.     atorvastatin (LIPITOR) 40 MG tablet TAKE 1 TABLET(40 MG) BY MOUTH DAILY AT 6 PM (Patient taking differently: Take 40 mg by mouth daily at 6 PM.) 90 tablet 1   fluticasone (FLONASE) 50 MCG/ACT nasal spray Place 1 spray into both nostrils every other day.     GLUCOSAMINE-CHONDROITIN PO Take 1 tablet by mouth 2 (two) times daily.      lidocaine (XYLOCAINE) 2 % solution Patient: Mix 1part 2% viscous lidocaine, 1part H20. Swish & swallow 63mL of diluted mixture, 15min before meals and at bedtime, up to QID 200 mL 3   lidocaine-prilocaine (EMLA) cream Apply 1 application topically as needed (Utilize prior to port-a-cath needle access). 30 g 3   losartan (COZAAR) 25 MG tablet TAKE 1 TABLET(25 MG) BY MOUTH TWICE DAILY (Patient taking differently: Take 25 mg by mouth 2 (two) times daily.) 180 tablet 3   Ascorbic Acid (VITAMIN C) 1000 MG tablet Take 1,000 mg by mouth daily.  (Patient not taking: Reported on 12/03/2020)     Calcium Carbonate-Vitamin D 600-200 MG-UNIT TABS Take 1 tablet by mouth every other day.  (Patient not taking: Reported on 12/03/2020)     Cyanocobalamin 1000 MCG TBCR Take 1,000 mcg by mouth daily.  (Patient not taking: Reported on 12/03/2020)     Emollient (CERAVE) LOTN Apply 1 application topically daily as needed (after shower care).      ferrous sulfate 325 (65 FE) MG tablet Take 325 mg by mouth every other day.     LORazepam (ATIVAN) 0.5 MG tablet Take 1 tablet (0.5 mg  total) by mouth every 6 (six) hours as needed (Nausea or vomiting). 30 tablet 0   Multiple Vitamins-Minerals (CENTRUM ADULTS PO) Take 1 tablet by mouth  daily.  (Patient not taking: Reported on 12/03/2020)     Omega-3 1000 MG CAPS Take 1,000 mg by mouth 2 (two) times daily. (Patient not taking: Reported on 12/03/2020)     ondansetron (ZOFRAN) 8 MG tablet Take 1 tablet (8 mg total) by mouth 2 (two) times daily as needed. Start on the third day after cisplatin chemotherapy. 30 tablet 1   prochlorperazine (COMPAZINE) 10 MG tablet Take 1 tablet (10 mg total) by mouth every 6 (six) hours as needed (Nausea or vomiting). 30 tablet 1   No current facility-administered medications for this visit.    PHYSICAL EXAMINATION: ECOG PERFORMANCE STATUS: 0 - Asymptomatic  Vitals:   12/03/20 0832  BP: (!) 145/60  Pulse: 74  Resp: 19  Temp: (!) 97.4 F (36.3 C)  SpO2: 98%   Filed Weights   12/03/20 0832  Weight: 193 lb 1.6 oz (87.6 kg)    Physical Exam Constitutional:      Appearance: Normal appearance.  HENT:     Head: Normocephalic and atraumatic.     Mouth/Throat:     Pharynx: Posterior oropharyngeal erythema (mucositis noted) present. No oropharyngeal exudate.  Cardiovascular:     Rate and Rhythm: Normal rate and regular rhythm.     Pulses: Normal pulses.     Heart sounds: Normal heart sounds.  Pulmonary:     Effort: Pulmonary effort is normal.     Breath sounds: Normal breath sounds.  Abdominal:     General: Abdomen is flat. Bowel sounds are normal.     Comments: G tube appears well   Musculoskeletal:        General: No swelling or tenderness. Normal range of motion.     Cervical back: Normal range of motion. No rigidity.  Lymphadenopathy:     Cervical: No cervical adenopathy.  Skin:    General: Skin is warm and dry.  Neurological:     General: No focal deficit present.     Mental Status: He is alert.  Psychiatric:        Mood and Affect: Mood normal.    LABORATORY DATA:  I  have reviewed the data as listed Lab Results  Component Value Date   WBC 2.5 (L) 12/03/2020   HGB 8.2 (L) 12/03/2020   HCT 23.0 (L) 12/03/2020   MCV 91.3 12/03/2020   PLT 38 (L) 12/03/2020     Chemistry      Component Value Date/Time   NA 130 (L) 12/03/2020 0817   NA 135 02/03/2020 1134   K 4.6 12/03/2020 0817   CL 95 (L) 12/03/2020 0817   CO2 26 12/03/2020 0817   BUN 23 12/03/2020 0817   BUN 10 02/03/2020 1134   CREATININE 1.02 12/03/2020 0817   CREATININE 1.00 05/11/2020 1127      Component Value Date/Time   CALCIUM 8.9 12/03/2020 0817   ALKPHOS 79 10/20/2020 1414   AST 22 10/20/2020 1414   ALT 15 10/20/2020 1414   BILITOT 0.7 10/20/2020 1414      RADIOGRAPHIC STUDIES: I have personally reviewed the radiological images as listed and agreed with the findings in the report. IR Gastrostomy Tube  Result Date: 11/06/2020 INDICATION: Head neck squamous cell carcinoma, feeding tube placement prior to chemo radiation therapy EXAM: FLUOROSCOPIC [place] Date:  11/06/2020 11/06/2020 10:27 am Radiologist:  M. Daryll Brod, MD Guidance:  Fluoroscopic MEDICATIONS: Ancef 2 g; Antibiotics were administered within 1 hour of the procedure. Glucagon 0.5 mg IV ANESTHESIA/SEDATION: Versed 2.0 mg IV;  Fentanyl 50 mcg IV Moderate Sedation Time:  10 minutes The patient was continuously monitored during the procedure by the interventional radiology nurse under my direct supervision. CONTRAST:  5mL OMNIPAQUE IOHEXOL 300 MG/ML SOLN - administered into the gastric lumen. FLUOROSCOPY TIME:  Fluoroscopy Time: 2 minutes 54 seconds (28 mGy). COMPLICATIONS: None. PROCEDURE: Informed consent was obtained from the patient following explanation of the procedure, risks, benefits and alternatives. The patient understands, agrees and consents for the procedure. All questions were addressed. A time out was performed. Maximal barrier sterile technique utilized including caps, mask, sterile  gowns, sterile gloves, large sterile drape, hand hygiene, and betadine prep. The left upper quadrant was sterilely prepped and draped. An oral gastric catheter was inserted into the stomach under fluoroscopy. The existing nasogastric feeding tube was removed. Air was injected into the stomach for insufflation and visualization under fluoroscopy. The air distended stomach was confirmed beneath the anterior abdominal wall in the frontal and lateral projections. Under sterile conditions and local anesthesia, a 40 gauge trocar needle was utilized to access the stomach percutaneously beneath the left subcostal margin. Needle position was confirmed within the stomach under biplane fluoroscopy. Contrast injection confirmed position also. A single T tack was deployed for gastropexy. Over an Amplatz guide wire, a 9-French sheath was inserted into the stomach. A snare device was utilized to capture the oral gastric catheter. The snare device was pulled retrograde from the stomach up the esophagus and out the oropharynx. The 20-French pull-through gastrostomy was connected to the snare device and pulled antegrade through the oropharynx down the esophagus into the stomach and then through the percutaneous tract external to the patient. The gastrostomy was assembled externally. Contrast injection confirms position in the stomach. Images were obtained for documentation. The patient tolerated procedure well. No immediate complication. IMPRESSION: Fluoroscopic insertion of a 20-French "pull-through" gastrostomy. Electronically Signed   By: Jerilynn Mages.  Shick M.D.   On: 11/06/2020 10:37   IR IMAGING GUIDED PORT INSERTION  Result Date: 11/06/2020 CLINICAL DATA:  Head neck squamous cell carcinoma, access for chemotherapy EXAM: RIGHT INTERNAL JUGULAR SINGLE LUMEN POWER PORT CATHETER INSERTION Date:  11/06/2020 11/06/2020 10:28 am Radiologist:  M. Daryll Brod, MD Guidance:  Ultrasound and fluoroscopic MEDICATIONS: Ancef 2 g for both  procedures; The antibiotic was administered within an appropriate time interval prior to skin puncture. ANESTHESIA/SEDATION: Versed 1.0 mg IV; Fentanyl 50 mcg IV; Moderate Sedation Time:  27 minutes The patient was continuously monitored during the procedure by the interventional radiology nurse under my direct supervision. FLUOROSCOPY TIME:  0 minutes, 54 seconds (5 mGy) COMPLICATIONS: None immediate. CONTRAST:  None. PROCEDURE: Informed consent was obtained from the patient following explanation of the procedure, risks, benefits and alternatives. The patient understands, agrees and consents for the procedure. All questions were addressed. A time out was performed. Maximal barrier sterile technique utilized including caps, mask, sterile gowns, sterile gloves, large sterile drape, hand hygiene, and 2% chlorhexidine scrub. Under sterile conditions and local anesthesia, right internal jugular micropuncture venous access was performed. Access was performed with ultrasound. Images were obtained for documentation of the patent right internal jugular vein. A guide wire was inserted followed by a transitional dilator. This allowed insertion of a guide wire and catheter into the IVC. Measurements were obtained from the SVC / RA junction back to the right IJ venotomy site. In the right infraclavicular chest, a subcutaneous pocket was created over the second anterior rib. This was done under sterile conditions and local anesthesia. 1% lidocaine with  epinephrine was utilized for this. A 2.5 cm incision was made in the skin. Blunt dissection was performed to create a subcutaneous pocket over the right pectoralis major muscle. The pocket was flushed with saline vigorously. There was adequate hemostasis. The port catheter was assembled and checked for leakage. The port catheter was secured in the pocket with two retention sutures. The tubing was tunneled subcutaneously to the right venotomy site and inserted into the SVC/RA  junction through a valved peel-away sheath. Position was confirmed with fluoroscopy. Images were obtained for documentation. The patient tolerated the procedure well. No immediate complications. Incisions were closed in a two layer fashion with 4 - 0 Vicryl suture. Dermabond was applied to the skin. The port catheter was accessed, blood was aspirated followed by saline and heparin flushes. Needle was removed. A dry sterile dressing was applied. IMPRESSION: Ultrasound and fluoroscopically guided right internal jugular single lumen power port catheter insertion. Tip in the SVC/RA junction. Catheter ready for use. Electronically Signed   By: Jerilynn Mages.  Shick M.D.   On: 11/06/2020 10:36     All questions were answered. The patient knows to call the clinic with any problems, questions or concerns. Labs reviewed, platelet count of 38,000 not adequate to proceed with treatment.  Creatinine and magnesium are normal.     Benay Pike, MD 12/03/2020 9:42 AM

## 2020-12-03 NOTE — Progress Notes (Signed)
Nutrition follow-up completed with patient's.  Patient receiving concurrent chemoradiation therapy for oropharyngeal cancer.  Chemotherapy today was canceled secondary to decreased platelets.  Weight stable and documented as 193.1 pounds on June 16. Patient weighed 193 pounds on June 9. Labs reviewed. Patient reports mild mucositis but he is swallowing okay.  Reports he is trying to eat whatever he can.  He is drinking 60 ounces of water by mouth a day.  Reports using approximately 3 cartons of tube feeding a day.  He uses a combination of [employer] 1.4 and Osmolite 1.5.  He also drinks 1 carton of original Ensure daily by mouth.  Yesterday he reports he was able to eat a large chicken tender, baked potato with cheese, and 2 tortillas. Noted intermittent constipation. Patient states he has plenty of tube feeding supplies.   Estimated nutrition needs: 2000-2300 cal, 110-125 g protein, 2.3 L fluid.  Nutrition diagnosis: Food and nutrition related knowledge deficit improved.  Intervention: Continue oral intake as tolerated.  Try to consume food at 3 meals a day. Continue 3 cartons of either [employer] 1.4 and/or Osmolite 1.5 via feeding tube.  Continue 1 Ensure by mouth. (~1385 cal and 60 g protein) Provided him with 1 additional case (12 cartons) of [employer] 1.4.  Monitoring, evaluation, goals: Patient will tolerate adequate calories and protein to minimize weight loss.  Next visit: Thursday, June 23 during infusion.  **Disclaimer: This note was dictated with voice recognition software. Similar sounding words can inadvertently be transcribed and this note may contain transcription errors which may not have been corrected upon publication of note.**

## 2020-12-04 ENCOUNTER — Other Ambulatory Visit: Payer: Self-pay

## 2020-12-04 ENCOUNTER — Ambulatory Visit
Admission: RE | Admit: 2020-12-04 | Discharge: 2020-12-04 | Disposition: A | Discharge status: Home / Self Care | Payer: Medicare Other | Source: Ambulatory Visit | Attending: Radiation Oncology | Admitting: Radiation Oncology

## 2020-12-04 DIAGNOSIS — C01 Malignant neoplasm of base of tongue: Secondary | ICD-10-CM | POA: Diagnosis not present

## 2020-12-04 DIAGNOSIS — C109 Malignant neoplasm of oropharynx, unspecified: Secondary | ICD-10-CM | POA: Diagnosis not present

## 2020-12-04 DIAGNOSIS — Z87891 Personal history of nicotine dependence: Secondary | ICD-10-CM | POA: Diagnosis not present

## 2020-12-04 DIAGNOSIS — Z510 Encounter for antineoplastic radiation therapy: Secondary | ICD-10-CM | POA: Diagnosis not present

## 2020-12-07 ENCOUNTER — Other Ambulatory Visit: Payer: Self-pay

## 2020-12-07 ENCOUNTER — Ambulatory Visit
Admission: RE | Admit: 2020-12-07 | Discharge: 2020-12-07 | Disposition: A | Discharge status: Home / Self Care | Payer: Medicare Other | Source: Ambulatory Visit | Attending: Radiation Oncology | Admitting: Radiation Oncology

## 2020-12-07 DIAGNOSIS — Z510 Encounter for antineoplastic radiation therapy: Secondary | ICD-10-CM | POA: Diagnosis not present

## 2020-12-07 DIAGNOSIS — C01 Malignant neoplasm of base of tongue: Secondary | ICD-10-CM | POA: Diagnosis not present

## 2020-12-07 DIAGNOSIS — Z87891 Personal history of nicotine dependence: Secondary | ICD-10-CM | POA: Diagnosis not present

## 2020-12-07 DIAGNOSIS — C109 Malignant neoplasm of oropharynx, unspecified: Secondary | ICD-10-CM | POA: Diagnosis not present

## 2020-12-08 ENCOUNTER — Ambulatory Visit: Payer: Medicare Other

## 2020-12-08 ENCOUNTER — Ambulatory Visit
Admission: RE | Admit: 2020-12-08 | Discharge: 2020-12-08 | Disposition: A | Discharge status: Home / Self Care | Payer: Medicare Other | Source: Ambulatory Visit | Attending: Radiation Oncology | Admitting: Radiation Oncology

## 2020-12-08 DIAGNOSIS — R293 Abnormal posture: Secondary | ICD-10-CM | POA: Diagnosis not present

## 2020-12-08 DIAGNOSIS — C109 Malignant neoplasm of oropharynx, unspecified: Secondary | ICD-10-CM | POA: Diagnosis not present

## 2020-12-08 DIAGNOSIS — R1310 Dysphagia, unspecified: Secondary | ICD-10-CM

## 2020-12-08 DIAGNOSIS — C01 Malignant neoplasm of base of tongue: Secondary | ICD-10-CM | POA: Diagnosis not present

## 2020-12-08 DIAGNOSIS — Z510 Encounter for antineoplastic radiation therapy: Secondary | ICD-10-CM | POA: Diagnosis not present

## 2020-12-08 DIAGNOSIS — Z87891 Personal history of nicotine dependence: Secondary | ICD-10-CM | POA: Diagnosis not present

## 2020-12-08 NOTE — Patient Instructions (Signed)
SWALLOWING EXERCISES Do these until 6 months after your last day of radiation, then 2-3 times per week afterwards  Effortful Swallows - Press your tongue against the roof of your mouth for 3 seconds, then squeeze the muscles in your neck while you swallow your saliva or a sip of water - Repeat 10-15 times, 2-3 times a day, and use whenever you eat or drink  [place] Swallow - swallow with your tongue sticking out - Stick tongue out past your lips and gently bite tongue with your teeth - Swallow, while holding your tongue with your teeth - Repeat 10-15 times, 2-3 times a day *use a wet spoon if your mouth gets dry*  Pitch Raise - Repeat "he", once per second in as high of a pitch as you can - Repeat 20 times, 2-3 times a day  Mendelsohn Maneuver - "half swallow" exercise - Start to swallow, and keep your Adam's apple up by squeezing hard with the muscles of the throat - Hold the squeeze for 5-7 seconds and then relax - Repeat 10-15 times, 2-3 times a day *use a wet spoon if your mouth gets dry*  Chin pushback - Open your mouth  - Place your fist UNDER your chin near your neck - Tuck your chin and push back with your fist for 5 seconds - Repeat 10 times, 2-3 times a day        6  "Super Swallow"  - Take a breath and hold it  - Bear down (like pushing your bowels)  - Swallow then IMMEDIATELY cough  - Repeat 10 times, 2-3 times a day  

## 2020-12-08 NOTE — Therapy (Signed)
[facility] [address] [place], [place], [address] Phone: [phone]   Fax:  [phone]  Speech Language Pathology Evaluation  Patient Details  Name: Jeffrey [name] MRN: [id number] Date of Birth: 1940/09/06 Referring Provider (SLP): Eppie Gibson, MD  Encounter Date: 12/08/2020   End of Session - 12/08/20 1725     Visit Number 1    Number of Visits 4    Date for SLP Re-Evaluation 03/08/21    SLP Start Time 74    SLP Stop Time  1400    SLP Time Calculation (min) 40 min    Activity Tolerance Patient tolerated treatment well             Past Medical History:  Diagnosis Date   AAA (abdominal aortic aneurysm) ([place])    Allergy    Arthritis    Colon polyps    Coronary artery calcification    Eczematous dermatitis    GERD (gastroesophageal reflux disease)    Hypertension    Iliac artery stenosis, left (HCC)    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   Right Buccal Cheek([facility])   Osteopenia     Past Surgical History:  Procedure Laterality Date   ABDOMINAL AORTIC ENDOVASCULAR FENESTRATED STENT GRAFT N/A 03/21/2018   Procedure: ABDOMINAL AORTIC ENDOVASCULAR FENESTRATED STENT GRAFT ; [place] CT PERFORMED;  Surgeon: [name], MD;  Location: [facility];  Service: Vascular;  Laterality: N/A;   Basal Skin Cancer  2012   CATARACT EXTRACTION Bilateral    IR GASTROSTOMY TUBE MOD SED  11/06/2020   IR IMAGING GUIDED PORT INSERTION  11/06/2020   MASS BIOPSY Right 09/25/2020   Procedure: OPEN NECK MASS BIOPSY;  Surgeon: Leta Baptist, MD;  Location: [facility];  Service: ENT;  Laterality: Right;   TONSILLECTOMY     TRANSURETHRAL RESECTION OF PROSTATE      There were no vitals filed for this visit.   Subjective Assessment - 12/08/20 1322     Subjective "It's more tube feeding at this time." "I do try to eat every day." Pt had sausage and egg english muffin in last 24 hours.    Currently in Pain? Yes                 SLP Evaluation [facility] - 12/08/20 1322       SLP Visit Information   SLP Received On 12/08/20    Referring Provider (SLP) Eppie Gibson, MD    Onset Date spring 2022    Medical Diagnosis oropharyngeal cancer      Subjective   Patient/Family Stated Goal maintain WNL swallowing function after chemorad tx      Pain Assessment   Pain Score 3     Pain Location Throat    Pain Orientation Medial      General Information   HPI 09/11/20 CT neck showed an enlarged right level 2 node. There was also noted to be asymmetric soft tissue at the right tongue base thought to reflect asymmetric tonsillar tissue, 10/05/20 excision of the right neck mass revealed poorly differentiated squamous cell carcinoma, p16 +. The nodule is mostly squamous cell carcinoma with a small amount of lymphoid tissue at the periphery, presumably residual lymph node. 10/05/20 PET revealed primary mucosal neoplasm in the right tongue base area demonstrating hypermetabolism, and no other enlarged or hypermetabolic neck nodes ID'd. Pt will receive 35 fractions of radiation to his Oropharynx and bilateral neck with weekly cisplatin. He started on 5/23 and will  complete 12/29/20.      Prior Functional Status   Cognitive/Linguistic Baseline Within functional limits      Cognition   Overall Cognitive Status Within Functional Limits for tasks assessed      Oral Motor/Sensory Function   Overall Oral Motor/Sensory Function Appears within functional limits for tasks assessed                 Pt currently tolerates a softer diet with thin liquids, however most of pt's nutritional needs are met via PEG "mostly because of my taste buds." - pt endorses dysgeusia/ageusia at this time, 4 1/2 weeks into his rad tx. POs: Pt ate mini-muffins and drank water without overt s/s aspiration. Thyroid elevation appeared adequate, and swallows appeared timely Pt's swallow deemed WNL/WFL at this time.    Because data states the  risk for dysphagia during and after radiation treatment is high due to undergoing radiation tx, SLP taught pt about the possibility of reduced/limited ability for PO intake during rad tx. SLP encouraged pt to continue swallowing POs as far into rad tx as possible, even ingesting POs and/or completing HEP shortly after administration of pain meds. Among other modifications for days when pt cannot functionally swallow, SLP talked about performing only non-swallowing tasks on the handout/HEP, and if necessary to cycle through the swallowing portion so the program of exercises can be completed instead of fatiguing on one of the swallowing exercises and not being able to perform the other swallowing exercises. Pt was then told to add all reps of swallowing tasks back in when it becomes possible to do so.   SLP educated pt re: changes to swallowing musculature after rad tx, and why adherence to dysphagia HEP provided today and PO consumption was necessary to inhibit muscular disuse atrophy and to reduce muscle fibrosis following rad tx. Pt demonstrated understanding of these things to SLP.   SLP then developed a HEP for pt and pt was instructed how to perform exercises involving lingual, vocal, and pharyngeal strengthening. SLP performed each exercise and pt return demonstrated each exercise. SLP ensured pt performance was correct prior to moving on to next exercise. Pt was instructed to complete this program 2 times a day, 6-7 days/week until 6 months after his last rad tx, then x2 a week after that.                    SLP Education - 11/26/20 1629       Education Details swallow HEP procedure, late effects head/neck radiation on swallow ability    Person(s) Educated Patient    Methods Explanation;Demonstration;Verbal cues    Comprehension Verbalized understanding;Returned demonstration;Verbal cues required;Need further instruction                     SLP Short Term Goals - 11/26/20 1635                 SLP SHORT TERM GOAL #1    Title pt will tell SLP why pt is completing HEP with modified independence    Time 1    Period --   visits, for all STGs    Status New         SLP SHORT TERM GOAL #2    Title pt will complete HEP with rare min A in 2 sessions    Time 2    Status New         SLP SHORT TERM GOAL #3  Title pt will describe 3 overt s/s aspiration PNA with modified independence    Time 3    Status New         SLP SHORT TERM GOAL #4    Title pt will tell SLP how a food journal could hasten return to a more normalized diet    Time 3    Status New                     SLP Long Term Goals - 11/26/20 1637                SLP LONG TERM GOAL #1    Title pt will complete HEP with modified independence over tree visits    Time 4    Period --   visits, for all LTGs    Status New         SLP LONG TERM GOAL #2    Title pt will complete HEP with independence over two visits    Time 5    Status New         SLP LONG TERM GOAL #3    Title pt will describe how to modify HEP over time, and the timeline associated with reduction in HEP frequency with modified independence over two sessions    Time 6    Status New                Plan - 12/08/20 1725     Clinical Impression Statement At this time Jeffrey [name]'s swallowing is deemed WNL/WFL with softer food items and thin liquids - noted that pt has been using PEG tube for primary nutrition due to ageusia/dysgeusia, but has been eating and drinking something each day - SLP congratulated pt with this and strongly encouraged him to continue this practice. SLP designed an individualized HEP for dysphagia and pt completed each exercise on their own with usual mod cues faded to modified independent. There are no overt s/s aspiration reported by pt at this time. Data indicate that pt's swallow ability will likely decrease over the course of radiation therapy and could very well decline over time following conclusion of their  radiation therapy due to muscle disuse atrophy and/or muscle fibrosis. Pt will cont to need to be seen by SLP in order to assess safety of PO intake, assess the need for recommending any objective swallow assessment, and ensuring pt correctly completes the individualized HEP.    Speech Therapy Frequency --   once approx every 4 weeks   Duration 12 weeks    Treatment/Interventions Aspiration precaution training;Pharyngeal strengthening exercises;Diet toleration management by SLP;Trials of upgraded texture/liquids;Patient/family education;SLP instruction and feedback;Internal/external aids    Potential to Achieve Goals Good    SLP Home Exercise Plan provided today    Consulted and Agree with Plan of Care Patient             Patient will benefit from skilled therapeutic intervention in order to improve the following deficits and impairments:   Dysphagia, unspecified type    Problem List Patient Active Problem List   Diagnosis Date Noted   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   Mucositis due to antineoplastic therapy 11/26/2020   Drug induced constipation 11/26/2020   Hypomagnesemia 11/26/2020   Normocytic normochromic anemia 11/19/2020   Port-A-Cath in place 11/09/2020   History of drug allergy 11/09/2020   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 [place], [place], [address] Phone: [phone]   Fax:   [phone]  Name: Jeffrey [name] MRN: [id number] Date of Birth: Jul 27, 1940

## 2020-12-09 ENCOUNTER — Ambulatory Visit
Admission: RE | Admit: 2020-12-09 | Discharge: 2020-12-09 | Disposition: A | Discharge status: Home / Self Care | Payer: Medicare Other | Source: Ambulatory Visit | Attending: Radiation Oncology | Admitting: Radiation Oncology

## 2020-12-09 ENCOUNTER — Other Ambulatory Visit: Payer: Self-pay

## 2020-12-09 DIAGNOSIS — C109 Malignant neoplasm of oropharynx, unspecified: Secondary | ICD-10-CM | POA: Diagnosis not present

## 2020-12-09 DIAGNOSIS — Z510 Encounter for antineoplastic radiation therapy: Secondary | ICD-10-CM | POA: Diagnosis not present

## 2020-12-09 DIAGNOSIS — Z87891 Personal history of nicotine dependence: Secondary | ICD-10-CM | POA: Diagnosis not present

## 2020-12-09 DIAGNOSIS — C01 Malignant neoplasm of base of tongue: Secondary | ICD-10-CM | POA: Diagnosis not present

## 2020-12-10 ENCOUNTER — Ambulatory Visit
Admission: RE | Admit: 2020-12-10 | Discharge: 2020-12-10 | Disposition: A | Discharge status: Home / Self Care | Payer: Medicare Other | Source: Ambulatory Visit | Attending: Radiation Oncology | Admitting: Radiation Oncology

## 2020-12-10 ENCOUNTER — Inpatient Hospital Stay: Payer: Medicare Other | Admitting: Hematology and Oncology

## 2020-12-10 ENCOUNTER — Telehealth: Payer: Self-pay | Admitting: Nutrition

## 2020-12-10 ENCOUNTER — Other Ambulatory Visit: Payer: Medicare Other

## 2020-12-10 ENCOUNTER — Encounter: Payer: Self-pay | Admitting: Hematology and Oncology

## 2020-12-10 ENCOUNTER — Inpatient Hospital Stay: Payer: Medicare Other

## 2020-12-10 ENCOUNTER — Other Ambulatory Visit: Payer: Self-pay

## 2020-12-10 ENCOUNTER — Inpatient Hospital Stay: Payer: Medicare Other | Admitting: Nutrition

## 2020-12-10 ENCOUNTER — Encounter: Payer: Medicare Other | Admitting: Nutrition

## 2020-12-10 VITALS — BP 146/68 | HR 78 | Temp 97.4°F | Resp 18 | Ht 70.0 in | Wt 193.4 lb

## 2020-12-10 DIAGNOSIS — Z5111 Encounter for antineoplastic chemotherapy: Secondary | ICD-10-CM | POA: Diagnosis not present

## 2020-12-10 DIAGNOSIS — Z800 Family history of malignant neoplasm of digestive organs: Secondary | ICD-10-CM | POA: Diagnosis not present

## 2020-12-10 DIAGNOSIS — D61818 Other pancytopenia: Secondary | ICD-10-CM | POA: Diagnosis not present

## 2020-12-10 DIAGNOSIS — C109 Malignant neoplasm of oropharynx, unspecified: Secondary | ICD-10-CM

## 2020-12-10 DIAGNOSIS — T451X5D Adverse effect of antineoplastic and immunosuppressive drugs, subsequent encounter: Secondary | ICD-10-CM

## 2020-12-10 DIAGNOSIS — K5903 Drug induced constipation: Secondary | ICD-10-CM | POA: Diagnosis not present

## 2020-12-10 DIAGNOSIS — T451X5A Adverse effect of antineoplastic and immunosuppressive drugs, initial encounter: Secondary | ICD-10-CM

## 2020-12-10 DIAGNOSIS — K1231 Oral mucositis (ulcerative) due to antineoplastic therapy: Secondary | ICD-10-CM | POA: Diagnosis not present

## 2020-12-10 DIAGNOSIS — Z931 Gastrostomy status: Secondary | ICD-10-CM | POA: Diagnosis not present

## 2020-12-10 DIAGNOSIS — C01 Malignant neoplasm of base of tongue: Secondary | ICD-10-CM | POA: Diagnosis not present

## 2020-12-10 DIAGNOSIS — R630 Anorexia: Secondary | ICD-10-CM | POA: Diagnosis not present

## 2020-12-10 DIAGNOSIS — Z87891 Personal history of nicotine dependence: Secondary | ICD-10-CM | POA: Diagnosis not present

## 2020-12-10 DIAGNOSIS — Z95828 Presence of other vascular implants and grafts: Secondary | ICD-10-CM

## 2020-12-10 DIAGNOSIS — D6959 Other secondary thrombocytopenia: Secondary | ICD-10-CM

## 2020-12-10 DIAGNOSIS — Z510 Encounter for antineoplastic radiation therapy: Secondary | ICD-10-CM | POA: Diagnosis not present

## 2020-12-10 DIAGNOSIS — R5383 Other fatigue: Secondary | ICD-10-CM | POA: Diagnosis not present

## 2020-12-10 DIAGNOSIS — R634 Abnormal weight loss: Secondary | ICD-10-CM | POA: Diagnosis not present

## 2020-12-10 DIAGNOSIS — D6481 Anemia due to antineoplastic chemotherapy: Secondary | ICD-10-CM | POA: Diagnosis not present

## 2020-12-10 LAB — CBC WITH DIFFERENTIAL (CANCER CENTER ONLY)
Abs Immature Granulocytes: 0.02 10*3/uL (ref 0.00–0.07)
Basophils Absolute: 0 10*3/uL (ref 0.0–0.1)
Basophils Relative: 1 %
Eosinophils Absolute: 0 10*3/uL (ref 0.0–0.5)
Eosinophils Relative: 0 %
HCT: 21.1 % — ABNORMAL LOW (ref 39.0–52.0)
Hemoglobin: 7.6 g/dL — ABNORMAL LOW (ref 13.0–17.0)
Immature Granulocytes: 2 %
Lymphocytes Relative: 16 %
Lymphs Abs: 0.2 10*3/uL — ABNORMAL LOW (ref 0.7–4.0)
MCH: 33.3 pg (ref 26.0–34.0)
MCHC: 36 g/dL (ref 30.0–36.0)
MCV: 92.5 fL (ref 80.0–100.0)
Monocytes Absolute: 0.4 10*3/uL (ref 0.1–1.0)
Monocytes Relative: 31 %
Neutro Abs: 0.6 10*3/uL — ABNORMAL LOW (ref 1.7–7.7)
Neutrophils Relative %: 50 %
Platelet Count: 35 10*3/uL — ABNORMAL LOW (ref 150–400)
RBC: 2.28 MIL/uL — ABNORMAL LOW (ref 4.22–5.81)
RDW: 13.9 % (ref 11.5–15.5)
WBC Count: 1.1 10*3/uL — ABNORMAL LOW (ref 4.0–10.5)
nRBC: 0 % (ref 0.0–0.2)

## 2020-12-10 LAB — BASIC METABOLIC PANEL - CANCER CENTER ONLY
Anion gap: 11 (ref 5–15)
BUN: 21 mg/dL (ref 8–23)
CO2: 23 mmol/L (ref 22–32)
Calcium: 8.7 mg/dL — ABNORMAL LOW (ref 8.9–10.3)
Chloride: 96 mmol/L — ABNORMAL LOW (ref 98–111)
Creatinine: 0.85 mg/dL (ref 0.61–1.24)
GFR, Estimated: >60 mL/min (ref >60)
Glucose, Bld: 98 mg/dL (ref 70–99)
Potassium: 4.4 mmol/L (ref 3.5–5.1)
Sodium: 130 mmol/L — ABNORMAL LOW (ref 135–145)

## 2020-12-10 LAB — MAGNESIUM: Magnesium: 1.8 mg/dL (ref 1.7–2.4)

## 2020-12-10 MED ORDER — SODIUM CHLORIDE 0.9% FLUSH
10.0000 mL | Freq: Once | INTRAVENOUS | Status: AC
  Administered 2020-12-10: 10 mL
  Filled 2020-12-10: qty 10

## 2020-12-10 MED ORDER — KATE FARMS STANDARD 1.4 EN LIQD: 4.0000 | Freq: Every day | ENTERAL | 6 refills | Status: DC

## 2020-12-10 MED ORDER — PALONOSETRON HCL INJECTION 0.25 MG/5ML
INTRAVENOUS | Status: AC
  Filled 2020-12-10: qty 5

## 2020-12-10 MED ORDER — HEPARIN SOD (PORK) LOCK FLUSH 100 UNIT/ML IV SOLN
500.0000 [IU] | Freq: Once | INTRAVENOUS | Status: AC
  Administered 2020-12-10: 500 [IU]
  Filled 2020-12-10: qty 5

## 2020-12-10 NOTE — Assessment & Plan Note (Signed)
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 ROS pertinent for sore throat, radiation dermatitis of the skin. Physical examination today, well appearing male patient with no major concerns except for mucositis and skin erythema in the neck. I reviewed the labs which showed worsening thrombocytopenia, anemia and leukopenia. Unfortunately he has not been able to recover from his cytopenias, hence we have not been able to proceed with planned chemo. We will repeat labs next week, anticipate being able to give at least a couple more cycles. Sent a message to Dr Isidore Moos about his ongoing severe cytopenias and inability to proceed with chemo.

## 2020-12-10 NOTE — Progress Notes (Signed)
Jeffrey [name]  Patient Care Team: Vivi Barrack, MD as PCP - General (Family Medicine) Buford Dresser, MD as PCP - Cardiology (Cardiology) Chevis Pretty as Consulting Physician (Dentistry) [name], MD as Consulting Physician (Vascular Surgery) [name], [name], PA-C as Physician Assistant (Dermatology) Malmfelt, Stephani Police, RN as Oncology Nurse Navigator Eppie Gibson, MD as Consulting Physician (Radiation Oncology) Leta Baptist, MD as Consulting Physician (Otolaryngology)  CHIEF COMPLAINTS/PURPOSE OF CONSULTATION:  Oropharyngeal cancer.  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 ROS pertinent for sore throat, radiation dermatitis of the skin. Physical examination today, well appearing male patient with no major concerns except for mucositis and skin erythema in the neck. I reviewed the labs which showed worsening thrombocytopenia, anemia and leukopenia. Unfortunately he has not been able to recover from his cytopenias, hence we have not been able to proceed with planned chemo. We will repeat labs next week, anticipate being able to give at least a couple more cycles. Sent a message to Dr Isidore Moos about his ongoing severe cytopenias and inability to proceed with chemo.  Mucositis due to antineoplastic therapy Mild to moderate He is reluctant to try pain medication at this time We will continue to monitor.  Chemotherapy-induced thrombocytopenia Severe, and continues to drop. No indication for platelet transfusion at this time Repeat labs next week May need 1 unit PRBC if Hb less than 7 on next lab Will get a sample for blood bank on the same day   No orders of the defined types were placed in this encounter.  HISTORY OF PRESENTING ILLNESS:   Jeffrey [name] 80 y.o. male is here because of new diagnosis of oropharyngeal cancer.   Oncology History Overview [name]  Patient first noticed to have neck mass about 2 months prior to presentation. He denies any complaints except for neck mass, no difficulty swallowing, pain at the base of the tongue. He went to see his PCP who recommended an [place] which demonstrated hypoechoic mass at the right level II a highly concerning for an enlarged pathologic LN. He was referred to ENT and CT neck was ordered. CT neck showed enlarged right level 2 node, asymmetric soft tissue at the right tongue base, favored to reflect asymmetric tonsillar issue. 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 PET CT showed primary mucosal neoplasm in the right tongue base area demonstrating hypermetabolism.    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HCC)  10/21/2020 Initial Diagnosis   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[facility])    10/21/2020 Cancer Staging   Staging form: Pharynx - HPV-Mediated Oropharynx, AJCC 8th Edition - Clinical stage from 10/21/2020: Stage I (cT1, cN1, cM0, p16+) - Signed by Benay Pike, MD on 10/21/2020  Stage prefix: Initial diagnosis    11/10/2020 -  Chemotherapy    Patient is on Treatment Plan: HEAD/NECK CISPLATIN Q7D        Interval history  Patient is here for follow-up prior to cycle 3 of cisplatin His chemo has been delayed because of cytopenias. He feels well except for sore throat and skin burns from radiation. He is reluctant to try anymore pain medication. He is using lidocaine for pain control Using G tube feedings, weight is stable. He is trying to eat  but pain limits him. Drinking some water and using G tube for the rest Rest of the pertinent 10 point ROS reviewed and neg.   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   Right Buccal Cheek([facility])   Osteopenia     SURGICAL HISTORY: Past Surgical History:  Procedure Laterality Date   ABDOMINAL AORTIC ENDOVASCULAR FENESTRATED STENT GRAFT N/A 03/21/2018   Procedure: ABDOMINAL AORTIC ENDOVASCULAR FENESTRATED STENT GRAFT ; [place] CT PERFORMED;  Surgeon: [name], MD;  Location: [facility];  Service: Vascular;  Laterality: N/A;   Basal Skin Cancer  2012   CATARACT EXTRACTION Bilateral    IR GASTROSTOMY TUBE MOD SED  11/06/2020   IR IMAGING GUIDED PORT INSERTION  11/06/2020   MASS BIOPSY Right 09/25/2020   Procedure: OPEN NECK MASS BIOPSY;  Surgeon: Leta Baptist, MD;  Location: [facility];  Service: ENT;  Laterality: Right;   TONSILLECTOMY     TRANSURETHRAL RESECTION OF PROSTATE      SOCIAL HISTORY: Social History   Socioeconomic History   Marital status: Divorced    Spouse name: Not on file   Number of children: 1   Years of education: Not on file   Highest education level: Not on file  Occupational History   Occupation: Retired    Fish farm manager: [employer]  Tobacco Use   Smoking status: Former    Packs/day: 1.00    Years: 32.00    Pack years: 32.00    Types: Cigarettes    Quit date: 08/17/1990    Years since quitting: 30.3   Smokeless tobacco: Never  Vaping Use   Vaping Use: Never used  Substance and Sexual Activity   Alcohol use: Yes    Comment: Occassionally, "when I feel like it"   Drug use: No   Sexual activity: Yes    Partners: Female  Other Topics Concern   Not on file  Social History Narrative   Not on file   Social Determinants of Health   Financial Resource Strain: Low Risk    Difficulty of Paying Living Expenses: Not hard at all  Food Insecurity: No Food Insecurity   Worried About Charity fundraiser in the  Last Year: Never true   Arboriculturist in the Last Year: Never true  Transportation Needs: No Transportation Needs   Lack of Transportation (Medical): No   Lack of Transportation (Non-Medical): No  Physical Activity: Insufficiently Active   Days of Exercise per Week: 5 days   Minutes of Exercise per Session: 20 min  Stress: No Stress Concern Present   Feeling of Stress : Not at all  Social Connections: Moderately Isolated   Frequency of Communication with Friends and Family: Once a week   Frequency of Social Gatherings with Friends and Family: Twice a week   Attends Religious Services: Never   Marine scientist or Organizations: Yes   Attends Music therapist: 1 to 4 times per year   Marital Status: Divorced  Human resources officer Violence: Not At Risk   Fear of Current or Ex-Partner: No   Emotionally Abused: No   Physically Abused: No  Sexually Abused: No    FAMILY HISTORY: Family History  Problem Relation Age of Onset   Diabetes Mother    Hypertension Mother    Stroke Mother    Alcohol abuse Father    Heart attack Father    Heart disease Father    Hypertension Father    Cancer Sister    Hyperlipidemia Sister    Pancreatic cancer Sister    Pancreatic cancer Brother    Hypertension Son     ALLERGIES:  is allergic to ketotifen fumarate, mixed ragweed, and triamcinolone.  MEDICATIONS:  Current Outpatient Medications  Medication Sig Dispense Refill   acetaminophen (TYLENOL) 650 MG CR tablet Take 1,300 mg by mouth every 8 (eight) hours as needed for pain.     amLODipine (NORVASC) 5 MG tablet Take 1 tablet (5 mg total) by mouth daily. 90 tablet 3   ARTIFICIAL TEAR SOLUTION OP Place 1 drop into both eyes daily as needed (dry eyes).     aspirin EC 81 MG tablet Take 81 mg by mouth daily. Swallow whole.     atorvastatin (LIPITOR) 40 MG tablet TAKE 1 TABLET(40 MG) BY MOUTH DAILY AT 6 PM (Patient taking differently: Take 40 mg by mouth daily at 6 PM.) 90 tablet 1    Emollient (CERAVE) LOTN Apply 1 application topically daily as needed (after shower care).      ferrous sulfate 325 (65 FE) MG tablet Take 325 mg by mouth every other day.     fluticasone (FLONASE) 50 MCG/ACT nasal spray Place 1 spray into both nostrils every other day.     GLUCOSAMINE-CHONDROITIN PO Take 1 tablet by mouth 2 (two) times daily.      lidocaine (XYLOCAINE) 2 % solution Patient: Mix 1part 2% viscous lidocaine, 1part H20. Swish & swallow 39mL of diluted mixture, 59min before meals and at bedtime, up to QID 200 mL 3   lidocaine-prilocaine (EMLA) cream Apply 1 application topically as needed (Utilize prior to port-a-cath needle access). 30 g 3   LORazepam (ATIVAN) 0.5 MG tablet Take 1 tablet (0.5 mg total) by mouth every 6 (six) hours as needed (Nausea or vomiting). 30 tablet 0   losartan (COZAAR) 25 MG tablet TAKE 1 TABLET(25 MG) BY MOUTH TWICE DAILY (Patient taking differently: Take 25 mg by mouth 2 (two) times daily.) 180 tablet 3   ondansetron (ZOFRAN) 8 MG tablet Take 1 tablet (8 mg total) by mouth 2 (two) times daily as needed. Start on the third day after cisplatin chemotherapy. 30 tablet 1   prochlorperazine (COMPAZINE) 10 MG tablet Take 1 tablet (10 mg total) by mouth every 6 (six) hours as needed (Nausea or vomiting). 30 tablet 1   Ascorbic Acid (VITAMIN C) 1000 MG tablet Take 1,000 mg by mouth daily.  (Patient not taking: Reported on 12/03/2020)     Calcium Carbonate-Vitamin D 600-200 MG-UNIT TABS Take 1 tablet by mouth every other day.  (Patient not taking: Reported on 12/03/2020)     Cyanocobalamin 1000 MCG TBCR Take 1,000 mcg by mouth daily.  (Patient not taking: Reported on 12/03/2020)     Multiple Vitamins-Minerals (CENTRUM ADULTS PO) Take 1 tablet by mouth daily.  (Patient not taking: Reported on 12/03/2020)     Omega-3 1000 MG CAPS Take 1,000 mg by mouth 2 (two) times daily. (Patient not taking: Reported on 12/03/2020)     No current facility-administered medications for  this visit.    PHYSICAL EXAMINATION: ECOG PERFORMANCE STATUS: 0 - Asymptomatic  Vitals:   12/10/20  0839  BP: (!) 146/68  Pulse: 78  Resp: 18  Temp: (!) 97.4 F (36.3 C)  SpO2: 98%   Filed Weights   12/10/20 0839  Weight: 193 lb 6.4 oz (87.7 kg)    Physical Exam Constitutional:      Appearance: Normal appearance.  HENT:     Head: Normocephalic and atraumatic.     Mouth/Throat:     Pharynx: Posterior oropharyngeal erythema (mucositis noted) present. No oropharyngeal exudate.  Cardiovascular:     Rate and Rhythm: Normal rate and regular rhythm.     Pulses: Normal pulses.     Heart sounds: Normal heart sounds.  Pulmonary:     Effort: Pulmonary effort is normal.     Breath sounds: Normal breath sounds.  Abdominal:     General: Abdomen is flat. Bowel sounds are normal.     Comments: G tube appears well   Musculoskeletal:        General: No swelling or tenderness. Normal range of motion.     Cervical back: Normal range of motion. No rigidity.  Lymphadenopathy:     Cervical: No cervical adenopathy.  Skin:    General: Skin is warm and dry.  Neurological:     General: No focal deficit present.     Mental Status: He is alert.  Psychiatric:        Mood and Affect: Mood normal.    LABORATORY DATA:  I have reviewed the data as listed Lab Results  Component Value Date   WBC 1.1 (L) 12/10/2020   HGB 7.6 (L) 12/10/2020   HCT 21.1 (L) 12/10/2020   MCV 92.5 12/10/2020   PLT 35 (L) 12/10/2020     Chemistry      Component Value Date/Time   NA 130 (L) 12/10/2020 0825   NA 135 02/03/2020 1134   K 4.4 12/10/2020 0825   CL 96 (L) 12/10/2020 0825   CO2 23 12/10/2020 0825   BUN 21 12/10/2020 0825   BUN 10 02/03/2020 1134   CREATININE 0.85 12/10/2020 0825   CREATININE 1.00 05/11/2020 1127      Component Value Date/Time   CALCIUM 8.7 (L) 12/10/2020 0825   ALKPHOS 79 10/20/2020 1414   AST 22 10/20/2020 1414   ALT 15 10/20/2020 1414   BILITOT 0.7 10/20/2020 1414      Labs reviewed, severe cytopenias,   RADIOGRAPHIC STUDIES: I have personally reviewed the radiological images as listed and agreed with the findings in the report. No results found.   All questions were answered. The patient knows to call the clinic with any problems, questions or concerns. Labs reviewed, platelet count of 35,000 and severe leukopenia not adequate to proceed with treatment.     Benay Pike, MD 12/10/2020 11:00 AM

## 2020-12-10 NOTE — Assessment & Plan Note (Signed)
Mild to moderate He is reluctant to try pain medication at this time We will continue to monitor.

## 2020-12-10 NOTE — Assessment & Plan Note (Signed)
Severe, and continues to drop. No indication for platelet transfusion at this time Repeat labs next week May need 1 unit PRBC if Hb less than 7 on next lab Will get a sample for blood bank on the same day

## 2020-12-10 NOTE — Progress Notes (Signed)
Nutrition follow-up completed with patient receiving concurrent chemoradiation therapy for oropharyngeal cancer.  Chemotherapy canceled today.  Patient scheduled to complete radiation therapy on July 12.  Weight stable at 193.4 pounds on June 23. Noted labs: Magnesium 1.8, sodium 130, white blood cells 1.1, hemoglobin 7.6.  Patient reports his throat is much more sore and is very difficult to eat very much.  He reports drinking water is difficult.  He is trying to continue with 60 ounces of water a day by mouth.  Patient is drinking original Ensure at breakfast, and using 1 carton of [employer] +1 carton Osmolite 1.5 via feeding tube twice a day.  This is providing approximately 1840 cal.  He flushes feeding tube with 120 mL water before and after feedings twice daily.  Reports constipation has resolved with increased water through his feeding tube.  He thinks he can continue to drink 1 carton Ensure daily  Estimated nutrition needs: 2000-2300 cal, 110-125 g protein, 2.3 L fluid.  Nutrition diagnosis: Food and nutrition related knowledge deficit improved.  Intervention: Continue food by mouth as tolerated trying to eat something 3 times daily. Change original Ensure to Ensure Plus or Ensure complete every morning by mouth. Give 4 cartons D[employer] [place] via PEG 1.4 split into 3 feedings. Tube feeding plus Ensure complete provides 2175 cal and 110 g protein.  This meets 100% estimated calorie and protein needs. Give an additional 240 mL free water 3 times daily between tube feedings.  If able, these can be taken by mouth. Tube feeding ordered and [employer] notified.  Next visit: Thursday, June 30 during infusion.  **Disclaimer: This note was dictated with voice recognition software. Similar sounding words can inadvertently be transcribed and this note may contain transcription errors which may not have been corrected upon publication of note.**

## 2020-12-10 NOTE — Telephone Encounter (Signed)
Patient contacted me with questions regarding a phone call he received from [employer].  States person left message requesting a payment of $98 before they would send tube feeding.  Patient hesitant to provide information unless this is a legitimate call.  Explained that [employer] may require a co-pay for his tube feeding prior to delivery.  Also educated patient that some insurance does not cover any cost of tube feeding.  Encouraged him to call them back and ask appropriate questions.  If patient does not want to pay for high cost tube feeding, it is possible we can find a lower cost option.  Patient agreeable and will contact RD for further information.

## 2020-12-11 ENCOUNTER — Ambulatory Visit
Admission: RE | Admit: 2020-12-11 | Discharge: 2020-12-11 | Disposition: A | Discharge status: Home / Self Care | Payer: Medicare Other | Source: Ambulatory Visit | Attending: Radiation Oncology | Admitting: Radiation Oncology

## 2020-12-11 ENCOUNTER — Other Ambulatory Visit: Payer: Self-pay

## 2020-12-11 DIAGNOSIS — Z510 Encounter for antineoplastic radiation therapy: Secondary | ICD-10-CM | POA: Diagnosis not present

## 2020-12-11 DIAGNOSIS — R1310 Dysphagia, unspecified: Secondary | ICD-10-CM | POA: Diagnosis not present

## 2020-12-11 DIAGNOSIS — C109 Malignant neoplasm of oropharynx, unspecified: Secondary | ICD-10-CM | POA: Diagnosis not present

## 2020-12-11 DIAGNOSIS — C01 Malignant neoplasm of base of tongue: Secondary | ICD-10-CM | POA: Diagnosis not present

## 2020-12-11 DIAGNOSIS — Z87891 Personal history of nicotine dependence: Secondary | ICD-10-CM | POA: Diagnosis not present

## 2020-12-11 DIAGNOSIS — Z931 Gastrostomy status: Secondary | ICD-10-CM | POA: Diagnosis not present

## 2020-12-13 ENCOUNTER — Other Ambulatory Visit: Payer: Self-pay | Admitting: Hematology

## 2020-12-14 ENCOUNTER — Ambulatory Visit
Admission: RE | Admit: 2020-12-14 | Discharge: 2020-12-14 | Disposition: A | Discharge status: Home / Self Care | Payer: Medicare Other | Source: Ambulatory Visit | Attending: Radiation Oncology | Admitting: Radiation Oncology

## 2020-12-14 ENCOUNTER — Other Ambulatory Visit: Payer: Self-pay

## 2020-12-14 DIAGNOSIS — Z510 Encounter for antineoplastic radiation therapy: Secondary | ICD-10-CM | POA: Diagnosis not present

## 2020-12-14 DIAGNOSIS — Z87891 Personal history of nicotine dependence: Secondary | ICD-10-CM | POA: Diagnosis not present

## 2020-12-14 DIAGNOSIS — C109 Malignant neoplasm of oropharynx, unspecified: Secondary | ICD-10-CM | POA: Diagnosis not present

## 2020-12-14 DIAGNOSIS — C01 Malignant neoplasm of base of tongue: Secondary | ICD-10-CM | POA: Diagnosis not present

## 2020-12-15 ENCOUNTER — Ambulatory Visit
Admission: RE | Admit: 2020-12-15 | Discharge: 2020-12-15 | Disposition: A | Discharge status: Home / Self Care | Payer: Medicare Other | Source: Ambulatory Visit | Attending: Radiation Oncology | Admitting: Radiation Oncology

## 2020-12-15 ENCOUNTER — Other Ambulatory Visit: Payer: Self-pay

## 2020-12-15 DIAGNOSIS — C109 Malignant neoplasm of oropharynx, unspecified: Secondary | ICD-10-CM | POA: Diagnosis not present

## 2020-12-15 DIAGNOSIS — C01 Malignant neoplasm of base of tongue: Secondary | ICD-10-CM | POA: Diagnosis not present

## 2020-12-15 DIAGNOSIS — Z510 Encounter for antineoplastic radiation therapy: Secondary | ICD-10-CM | POA: Diagnosis not present

## 2020-12-15 DIAGNOSIS — Z87891 Personal history of nicotine dependence: Secondary | ICD-10-CM | POA: Diagnosis not present

## 2020-12-16 ENCOUNTER — Ambulatory Visit
Admission: RE | Admit: 2020-12-16 | Discharge: 2020-12-16 | Disposition: A | Discharge status: Home / Self Care | Payer: Medicare Other | Source: Ambulatory Visit | Attending: Radiation Oncology | Admitting: Radiation Oncology

## 2020-12-16 ENCOUNTER — Other Ambulatory Visit: Payer: Self-pay

## 2020-12-16 ENCOUNTER — Other Ambulatory Visit: Payer: Self-pay | Admitting: Hematology

## 2020-12-16 ENCOUNTER — Telehealth: Payer: Self-pay | Admitting: Hematology

## 2020-12-16 DIAGNOSIS — Z510 Encounter for antineoplastic radiation therapy: Secondary | ICD-10-CM | POA: Diagnosis not present

## 2020-12-16 DIAGNOSIS — C109 Malignant neoplasm of oropharynx, unspecified: Secondary | ICD-10-CM | POA: Diagnosis not present

## 2020-12-16 DIAGNOSIS — C01 Malignant neoplasm of base of tongue: Secondary | ICD-10-CM | POA: Diagnosis not present

## 2020-12-16 DIAGNOSIS — Z87891 Personal history of nicotine dependence: Secondary | ICD-10-CM | POA: Diagnosis not present

## 2020-12-16 NOTE — Telephone Encounter (Signed)
Left message with changed MD appointment due to provider not in office.

## 2020-12-16 NOTE — Progress Notes (Deleted)
[place]   Telephone:(336) [phone] Fax:(336) [phone]   Clinic Follow up Note   Patient Care Team: Vivi Barrack, MD as PCP - General (Family Medicine) Buford Dresser, MD as PCP - Cardiology (Cardiology) Chevis Pretty as Consulting Physician (Dentistry) [name], MD as Consulting Physician (Vascular Surgery) Warren Danes, PA-C as Physician Assistant (Dermatology) Malmfelt, Stephani Police, RN as Oncology Nurse Navigator Eppie Gibson, MD as Consulting Physician (Radiation Oncology) Leta Baptist, MD as Consulting Physician (Otolaryngology) 12/16/2020  CHIEF COMPLAINT: Follow-up SCC of oropharynx  SUMMARY OF ONCOLOGIC HISTORY: Oncology History Overview Note  Patient first noticed to have neck mass about 2 months prior to presentation. He denies any complaints except for neck mass, no difficulty swallowing, pain at the base of the tongue. He went to see his PCP who recommended an [place] which demonstrated hypoechoic mass at the right level II a highly concerning for an enlarged pathologic LN. He was referred to ENT and CT neck was ordered. CT neck showed enlarged right level 2 node, asymmetric soft tissue at the right tongue base, favored to reflect asymmetric tonsillar issue. 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 PET CT showed primary mucosal neoplasm in the right tongue base area demonstrating hypermetabolism.    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HCC)  10/21/2020 Initial Diagnosis   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[place])    10/21/2020 Cancer Staging   Staging form: Pharynx - HPV-Mediated Oropharynx, AJCC 8th Edition - Clinical stage from 10/21/2020: Stage I (cT1, cN1, cM0, p16+) - Signed by Benay Pike, MD on 10/21/2020  Stage prefix: Initial diagnosis    11/10/2020 -  Chemotherapy    Patient is on Treatment Plan: HEAD/NECK CISPLATIN Q7D          CURRENT THERAPY: Chemo RT with weekly cisplatin, starting 11/10/2020  INTERVAL HISTORY: Jeffrey [name] returns for follow-up and treatment as scheduled prior to week 3 chemo RT with cisplatin.   REVIEW OF SYSTEMS:   Constitutional: Denies fevers, chills or abnormal weight loss Eyes: Denies blurriness of vision Ears, nose, mouth, throat, and face: Denies mucositis or sore throat Respiratory: Denies cough, dyspnea or wheezes Cardiovascular: Denies palpitation, chest discomfort or lower extremity swelling Gastrointestinal:  Denies nausea, heartburn or change in bowel habits Skin: Denies abnormal skin rashes Lymphatics: Denies new lymphadenopathy or easy bruising Neurological:Denies numbness, tingling or new weaknesses Behavioral/Psych: Mood is stable, no new changes  All other systems were reviewed with the patient and are negative.  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   Right Buccal Cheek([facility])   Osteopenia     SURGICAL HISTORY: Past Surgical History:  Procedure Laterality Date   ABDOMINAL AORTIC ENDOVASCULAR FENESTRATED STENT GRAFT N/A 03/21/2018   Procedure: ABDOMINAL AORTIC ENDOVASCULAR FENESTRATED STENT GRAFT ; [place] CT PERFORMED;  Surgeon: [name], MD;  Location: [facility];  Service: Vascular;  Laterality: N/A;   Basal Skin Cancer  2012   CATARACT EXTRACTION Bilateral    IR GASTROSTOMY TUBE MOD SED  11/06/2020   IR IMAGING GUIDED PORT INSERTION  11/06/2020   MASS  BIOPSY Right 09/25/2020   Procedure: OPEN NECK MASS BIOPSY;  Surgeon: Leta Baptist, MD;  Location: [facility];  Service: ENT;  Laterality: Right;   TONSILLECTOMY     TRANSURETHRAL RESECTION OF  PROSTATE      I have reviewed the social history and family history with the patient and they are unchanged from previous note.  ALLERGIES:  is allergic to ketotifen fumarate, mixed ragweed, and triamcinolone.  MEDICATIONS:  Current Outpatient Medications  Medication Sig Dispense Refill   acetaminophen (TYLENOL) 650 MG CR tablet Take 1,300 mg by mouth every 8 (eight) hours as needed for pain.     amLODipine (NORVASC) 5 MG tablet Take 1 tablet (5 mg total) by mouth daily. 90 tablet 3   ARTIFICIAL TEAR SOLUTION OP Place 1 drop into both eyes daily as needed (dry eyes).     Ascorbic Acid (VITAMIN C) 1000 MG tablet Take 1,000 mg by mouth daily.  (Patient not taking: Reported on 12/03/2020)     aspirin EC 81 MG tablet Take 81 mg by mouth daily. Swallow whole.     atorvastatin (LIPITOR) 40 MG tablet TAKE 1 TABLET(40 MG) BY MOUTH DAILY AT 6 PM (Patient taking differently: Take 40 mg by mouth daily at 6 PM.) 90 tablet 1   Calcium Carbonate-Vitamin D 600-200 MG-UNIT TABS Take 1 tablet by mouth every other day.  (Patient not taking: Reported on 12/03/2020)     Cyanocobalamin 1000 MCG TBCR Take 1,000 mcg by mouth daily.  (Patient not taking: Reported on 12/03/2020)     Emollient (CERAVE) LOTN Apply 1 application topically daily as needed (after shower care).      ferrous sulfate 325 (65 FE) MG tablet Take 325 mg by mouth every other day.     fluticasone (FLONASE) 50 MCG/ACT nasal spray Place 1 spray into both nostrils every other day.     GLUCOSAMINE-CHONDROITIN PO Take 1 tablet by mouth 2 (two) times daily.      lidocaine (XYLOCAINE) 2 % solution Patient: Mix 1part 2% viscous lidocaine, 1part H20. Swish & swallow 1mL of diluted mixture, 58min before meals and at bedtime, up to QID 200 mL 3   lidocaine-prilocaine (EMLA) cream Apply 1 application topically as needed (Utilize prior to port-a-cath needle access). 30 g 3   LORazepam (ATIVAN) 0.5 MG tablet Take 1 tablet (0.5 mg total) by mouth every 6 (six)  hours as needed (Nausea or vomiting). 30 tablet 0   losartan (COZAAR) 25 MG tablet TAKE 1 TABLET(25 MG) BY MOUTH TWICE DAILY (Patient taking differently: Take 25 mg by mouth 2 (two) times daily.) 180 tablet 3   Multiple Vitamins-Minerals (CENTRUM ADULTS PO) Take 1 tablet by mouth daily.  (Patient not taking: Reported on 12/03/2020)     Nutritional Supplements (KATE FARMS STANDARD 1.4) LIQD 4 Bottles by Percutaneous Endoscopic Gastrostomy route daily. 1300 mL 6   Omega-3 1000 MG CAPS Take 1,000 mg by mouth 2 (two) times daily. (Patient not taking: Reported on 12/03/2020)     ondansetron (ZOFRAN) 8 MG tablet Take 1 tablet (8 mg total) by mouth 2 (two) times daily as needed. Start on the third day after cisplatin chemotherapy. 30 tablet 1   prochlorperazine (COMPAZINE) 10 MG tablet Take 1 tablet (10 mg total) by mouth every 6 (six) hours as needed (Nausea or vomiting). 30 tablet 1   No current facility-administered medications for this visit.    PHYSICAL EXAMINATION: ECOG PERFORMANCE STATUS: {CHL ONC ECOG WG:[id number]}  There were no vitals  filed for this visit. There were no vitals filed for this visit.  GENERAL:alert, no distress and comfortable SKIN: skin color, texture, turgor are normal, no rashes or significant lesions EYES: normal, Conjunctiva are pink and non-injected, sclera clear OROPHARYNX:no exudate, no erythema and lips, buccal mucosa, and tongue normal  NECK: supple, thyroid normal size, non-tender, without nodularity LYMPH:  no palpable lymphadenopathy in the cervical, axillary or inguinal LUNGS: clear to auscultation and percussion with normal breathing effort HEART: regular rate & rhythm and no murmurs and no lower extremity edema ABDOMEN:abdomen soft, non-tender and normal bowel sounds Musculoskeletal:no cyanosis of digits and no clubbing  NEURO: alert & oriented x 3 with fluent speech, no focal motor/sensory deficits  LABORATORY DATA:  I have reviewed the data as  listed CBC Latest Ref Rng & Units 12/10/2020 12/03/2020 11/26/2020  WBC 4.0 - 10.5 K/uL 1.1(L) 2.5(L) 4.7  Hemoglobin 13.0 - 17.0 g/dL 7.6(L) 8.2(L) 9.1(L)  Hematocrit 39.0 - 52.0 % 21.1(L) 23.0(L) 24.8(L)  Platelets 150 - 400 K/uL 35(L) 38(L) 74(L)     CMP Latest Ref Rng & Units 12/10/2020 12/03/2020 11/26/2020  Glucose 70 - 99 mg/dL 98 84 106(H)  BUN 8 - 23 mg/dL 21 23 16   Creatinine 0.61 - 1.24 mg/dL 0.85 1.02 0.98  Sodium 135 - 145 mmol/L 130(L) 130(L) 126(L)  Potassium 3.5 - 5.1 mmol/L 4.4 4.6 4.4  Chloride 98 - 111 mmol/L 96(L) 95(L) 95(L)  CO2 22 - 32 mmol/L 23 26 23   Calcium 8.9 - 10.3 mg/dL 8.7(L) 8.9 9.1  Total Protein 6.5 - 8.1 g/dL - - -  Total Bilirubin 0.3 - 1.2 mg/dL - - -  Alkaline Phos 38 - 126 U/L - - -  AST 15 - 41 U/L - - -  ALT 0 - 44 U/L - - -      RADIOGRAPHIC STUDIES: I have personally reviewed the radiological images as listed and agreed with the findings in the report. No results found.   ASSESSMENT & PLAN:  No problem-specific Assessment & Plan notes found for this encounter.   No orders of the defined types were placed in this encounter.  All questions were answered. The patient knows to call the clinic with any problems, questions or concerns. No barriers to learning was detected. I spent {CHL ONC TIME VISIT - LAGTX:[id number]} counseling the patient face to face. The total time spent in the appointment was {CHL ONC TIME VISIT - QMGNO:[id number]} and more than 50% was on counseling and review of test results     Alla Feeling, NP 12/16/20

## 2020-12-17 ENCOUNTER — Ambulatory Visit: Payer: Medicare Other | Admitting: Nurse Practitioner

## 2020-12-17 ENCOUNTER — Ambulatory Visit
Admission: RE | Admit: 2020-12-17 | Discharge: 2020-12-17 | Disposition: A | Discharge status: Home / Self Care | Payer: Medicare Other | Source: Ambulatory Visit | Attending: Radiation Oncology | Admitting: Radiation Oncology

## 2020-12-17 ENCOUNTER — Encounter: Payer: Self-pay | Admitting: Hematology

## 2020-12-17 ENCOUNTER — Inpatient Hospital Stay: Payer: Medicare Other

## 2020-12-17 ENCOUNTER — Inpatient Hospital Stay: Payer: Medicare Other | Admitting: Nutrition

## 2020-12-17 ENCOUNTER — Other Ambulatory Visit: Payer: Medicare Other

## 2020-12-17 ENCOUNTER — Inpatient Hospital Stay: Payer: Medicare Other | Admitting: Hematology

## 2020-12-17 VITALS — BP 144/65 | HR 80 | Temp 97.2°F | Resp 19 | Ht 70.0 in | Wt 191.7 lb

## 2020-12-17 DIAGNOSIS — Z800 Family history of malignant neoplasm of digestive organs: Secondary | ICD-10-CM | POA: Diagnosis not present

## 2020-12-17 DIAGNOSIS — C109 Malignant neoplasm of oropharynx, unspecified: Secondary | ICD-10-CM | POA: Diagnosis not present

## 2020-12-17 DIAGNOSIS — R5383 Other fatigue: Secondary | ICD-10-CM | POA: Diagnosis not present

## 2020-12-17 DIAGNOSIS — Z5111 Encounter for antineoplastic chemotherapy: Secondary | ICD-10-CM | POA: Diagnosis not present

## 2020-12-17 DIAGNOSIS — D6959 Other secondary thrombocytopenia: Secondary | ICD-10-CM | POA: Diagnosis not present

## 2020-12-17 DIAGNOSIS — Z510 Encounter for antineoplastic radiation therapy: Secondary | ICD-10-CM | POA: Diagnosis not present

## 2020-12-17 DIAGNOSIS — C01 Malignant neoplasm of base of tongue: Secondary | ICD-10-CM | POA: Diagnosis not present

## 2020-12-17 DIAGNOSIS — K5903 Drug induced constipation: Secondary | ICD-10-CM | POA: Diagnosis not present

## 2020-12-17 DIAGNOSIS — T451X5A Adverse effect of antineoplastic and immunosuppressive drugs, initial encounter: Secondary | ICD-10-CM | POA: Diagnosis not present

## 2020-12-17 DIAGNOSIS — D6481 Anemia due to antineoplastic chemotherapy: Secondary | ICD-10-CM | POA: Diagnosis not present

## 2020-12-17 DIAGNOSIS — K1231 Oral mucositis (ulcerative) due to antineoplastic therapy: Secondary | ICD-10-CM | POA: Diagnosis not present

## 2020-12-17 DIAGNOSIS — Z95828 Presence of other vascular implants and grafts: Secondary | ICD-10-CM

## 2020-12-17 DIAGNOSIS — Z87891 Personal history of nicotine dependence: Secondary | ICD-10-CM | POA: Diagnosis not present

## 2020-12-17 DIAGNOSIS — Z931 Gastrostomy status: Secondary | ICD-10-CM | POA: Diagnosis not present

## 2020-12-17 DIAGNOSIS — R634 Abnormal weight loss: Secondary | ICD-10-CM | POA: Diagnosis not present

## 2020-12-17 DIAGNOSIS — R630 Anorexia: Secondary | ICD-10-CM | POA: Diagnosis not present

## 2020-12-17 LAB — CBC WITH DIFFERENTIAL (CANCER CENTER ONLY)
Abs Immature Granulocytes: 0.03 K/uL (ref 0.00–0.07)
Basophils Absolute: 0 K/uL (ref 0.0–0.1)
Basophils Relative: 0 %
Eosinophils Absolute: 0 K/uL (ref 0.0–0.5)
Eosinophils Relative: 0 %
HCT: 21.5 % — ABNORMAL LOW (ref 39.0–52.0)
Hemoglobin: 7.5 g/dL — ABNORMAL LOW (ref 13.0–17.0)
Immature Granulocytes: 1 %
Lymphocytes Relative: 11 %
Lymphs Abs: 0.3 K/uL — ABNORMAL LOW (ref 0.7–4.0)
MCH: 33.3 pg (ref 26.0–34.0)
MCHC: 34.9 g/dL (ref 30.0–36.0)
MCV: 95.6 fL (ref 80.0–100.0)
Monocytes Absolute: 0.8 K/uL (ref 0.1–1.0)
Monocytes Relative: 31 %
Neutro Abs: 1.4 K/uL — ABNORMAL LOW (ref 1.7–7.7)
Neutrophils Relative %: 57 %
Platelet Count: 72 K/uL — ABNORMAL LOW (ref 150–400)
RBC: 2.25 MIL/uL — ABNORMAL LOW (ref 4.22–5.81)
RDW: 15.9 % — ABNORMAL HIGH (ref 11.5–15.5)
WBC Count: 2.5 K/uL — ABNORMAL LOW (ref 4.0–10.5)
nRBC: 0 % (ref 0.0–0.2)

## 2020-12-17 LAB — BASIC METABOLIC PANEL - CANCER CENTER ONLY
Anion gap: 10 (ref 5–15)
BUN: 26 mg/dL — ABNORMAL HIGH (ref 8–23)
CO2: 24 mmol/L (ref 22–32)
Calcium: 8.6 mg/dL — ABNORMAL LOW (ref 8.9–10.3)
Chloride: 97 mmol/L — ABNORMAL LOW (ref 98–111)
Creatinine: 1.05 mg/dL (ref 0.61–1.24)
GFR, Estimated: >60 mL/min
Glucose, Bld: 132 mg/dL — ABNORMAL HIGH (ref 70–99)
Potassium: 4.6 mmol/L (ref 3.5–5.1)
Sodium: 131 mmol/L — ABNORMAL LOW (ref 135–145)

## 2020-12-17 LAB — SAMPLE TO BLOOD BANK

## 2020-12-17 LAB — MAGNESIUM: Magnesium: 1.9 mg/dL (ref 1.7–2.4)

## 2020-12-17 MED ORDER — SODIUM CHLORIDE 0.9 % IV SOLN
20.0000 mg/m2 | Freq: Once | INTRAVENOUS | Status: AC
  Administered 2020-12-17: 42 mg via INTRAVENOUS
  Filled 2020-12-17: qty 42

## 2020-12-17 MED ORDER — SODIUM CHLORIDE 0.9% FLUSH
10.0000 mL | Freq: Once | INTRAVENOUS | Status: AC
  Administered 2020-12-17: 10 mL
  Filled 2020-12-17: qty 10

## 2020-12-17 MED ORDER — MAGNESIUM SULFATE 2 GM/50ML IV SOLN
2.0000 g | Freq: Once | INTRAVENOUS | Status: AC
  Administered 2020-12-17: 2 g via INTRAVENOUS

## 2020-12-17 MED ORDER — POTASSIUM CHLORIDE IN NACL 20-0.9 MEQ/L-% IV SOLN
Freq: Once | INTRAVENOUS | Status: AC
Start: 2020-12-17 — End: 2020-12-17
  Filled 2020-12-17: qty 1000

## 2020-12-17 MED ORDER — PALONOSETRON HCL INJECTION 0.25 MG/5ML
0.2500 mg | Freq: Once | INTRAVENOUS | Status: AC
Start: 2020-12-17 — End: 2020-12-17
  Administered 2020-12-17: 0.25 mg via INTRAVENOUS

## 2020-12-17 MED ORDER — MAGNESIUM SULFATE 2 GM/50ML IV SOLN
INTRAVENOUS | Status: AC
  Filled 2020-12-17: qty 50

## 2020-12-17 MED ORDER — SODIUM CHLORIDE 0.9 % IV SOLN
150.0000 mg | Freq: Once | INTRAVENOUS | Status: AC
  Administered 2020-12-17: 150 mg via INTRAVENOUS
  Filled 2020-12-17: qty 150

## 2020-12-17 MED ORDER — SODIUM CHLORIDE 0.9 % IV SOLN
10.0000 mg | Freq: Once | INTRAVENOUS | Status: AC
  Administered 2020-12-17: 10 mg via INTRAVENOUS
  Filled 2020-12-17: qty 10

## 2020-12-17 MED ORDER — HEPARIN SOD (PORK) LOCK FLUSH 100 UNIT/ML IV SOLN
500.0000 [IU] | Freq: Once | INTRAVENOUS | Status: AC | PRN
  Administered 2020-12-17: 500 [IU]
  Filled 2020-12-17: qty 5

## 2020-12-17 MED ORDER — SODIUM CHLORIDE 0.9% FLUSH
10.0000 mL | INTRAVENOUS | Status: DC | PRN
  Administered 2020-12-17: 10 mL
  Filled 2020-12-17: qty 10

## 2020-12-17 MED ORDER — PALONOSETRON HCL INJECTION 0.25 MG/5ML
INTRAVENOUS | Status: AC
  Filled 2020-12-17: qty 5

## 2020-12-17 MED ORDER — SODIUM CHLORIDE 0.9 % IV SOLN
Freq: Once | INTRAVENOUS | Status: AC
Start: 2020-12-17 — End: 2020-12-17
  Filled 2020-12-17: qty 250

## 2020-12-17 NOTE — Progress Notes (Signed)
[facility]   Telephone:(336) [phone] Fax:(336) [phone]   Clinic Follow up Note   Patient Care Team: Vivi Barrack, MD as PCP - General (Family Medicine) Buford Dresser, MD as PCP - Cardiology (Cardiology) Chevis Pretty as Consulting Physician (Dentistry) [name], MD as Consulting Physician (Vascular Surgery) Warren Danes, PA-C as Physician Assistant (Dermatology) Malmfelt, Stephani Police, RN as Oncology Nurse Navigator Eppie Gibson, MD as Consulting Physician (Radiation Oncology) Leta Baptist, MD as Consulting Physician (Otolaryngology) Benay Pike, MD as Consulting Physician (Hematology and Oncology)  Date of Service:  12/17/2020  CHIEF COMPLAINT: f/u of oropharyngeal cancer  SUMMARY OF ONCOLOGIC HISTORY: Oncology History Overview Note  Patient first noticed to have neck mass about 2 months prior to presentation. He denies any complaints except for neck mass, no difficulty swallowing, pain at the base of the tongue. He went to see his PCP who recommended an [place] which demonstrated hypoechoic mass at the right level II a highly concerning for an enlarged pathologic LN. He was referred to ENT and CT neck was ordered. CT neck showed enlarged right level 2 node, asymmetric soft tissue at the right tongue base, favored to reflect asymmetric tonsillar issue. 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 PET CT showed primary mucosal neoplasm in the right tongue base area demonstrating hypermetabolism.    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[place])  10/21/2020 Initial Diagnosis   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[place])    10/21/2020 Cancer Staging   Staging form: Pharynx - HPV-Mediated Oropharynx, AJCC 8th Edition - Clinical stage from 10/21/2020: Stage I (cT1, cN1, cM0, p16+) - Signed by Benay Pike, MD on 10/21/2020  Stage prefix: Initial diagnosis     11/10/2020 -  Chemotherapy    Patient is on Treatment Plan: HEAD/NECK CISPLATIN Q7D          CURRENT THERAPY:  Cisplatin q7d, starting 11/10/20, on hold since 11/19/20 due to cytopenias Concurrent radiation therapy, through 12/29/20  INTERVAL HISTORY:  Jeffrey [name] is here for a follow up of oropharyngeal cancer. I am seeing him out of courtesy for Dr. Chryl Heck while she is on vacation. He drives himself here. He notes he is having worsening side effects from radiation therapy. He reports he has spoken to Dr. Isidore Moos about this, who offered increasing pain medication. He reports he is entirely dependent on his feeding tube due to the burning in his throat and issues swallowing. He was previously drinking an ensure each day but is now unable to tolerate this. He had previously been using 0% alcohol Listerine. He notes he has not tried magic mouthwash yet. He denies any nausea. He notes his energy is also depleted, but he is still able to take care of himself. He lives alone and doesn't require any outside assistance.  All other systems were reviewed with the patient and are negative.  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   Right Buccal Cheek([facility])   Osteopenia     SURGICAL HISTORY: Past Surgical History:  Procedure Laterality Date   ABDOMINAL AORTIC ENDOVASCULAR FENESTRATED STENT  GRAFT N/A 03/21/2018   Procedure: ABDOMINAL AORTIC ENDOVASCULAR FENESTRATED STENT GRAFT ; [place] CT PERFORMED;  Surgeon: [name], MD;  Location: [facility];  Service: Vascular;  Laterality: N/A;   Basal Skin Cancer  2012   CATARACT EXTRACTION Bilateral    IR GASTROSTOMY TUBE MOD SED  11/06/2020   IR IMAGING  GUIDED PORT INSERTION  11/06/2020   MASS BIOPSY Right 09/25/2020   Procedure: OPEN NECK MASS BIOPSY;  Surgeon: Leta Baptist, MD;  Location: [facility];  Service: ENT;  Laterality: Right;   TONSILLECTOMY     TRANSURETHRAL RESECTION OF PROSTATE      I have reviewed the social history and family history with the patient and they are unchanged from previous note.  ALLERGIES:  is allergic to ketotifen fumarate, mixed ragweed, and triamcinolone.  MEDICATIONS:  Current Outpatient Medications  Medication Sig Dispense Refill   acetaminophen (TYLENOL) 650 MG CR tablet Take 1,300 mg by mouth every 8 (eight) hours as needed for pain.     amLODipine (NORVASC) 5 MG tablet Take 1 tablet (5 mg total) by mouth daily. 90 tablet 3   ARTIFICIAL TEAR SOLUTION OP Place 1 drop into both eyes daily as needed (dry eyes).     Ascorbic Acid (VITAMIN C) 1000 MG tablet Take 1,000 mg by mouth daily.  (Patient not taking: Reported on 12/03/2020)     aspirin EC 81 MG tablet Take 81 mg by mouth daily. Swallow whole.     atorvastatin (LIPITOR) 40 MG tablet TAKE 1 TABLET(40 MG) BY MOUTH DAILY AT 6 PM (Patient taking differently: Take 40 mg by mouth daily at 6 PM.) 90 tablet 1   Calcium Carbonate-Vitamin D 600-200 MG-UNIT TABS Take 1 tablet by mouth every other day.  (Patient not taking: Reported on 12/03/2020)     Cyanocobalamin 1000 MCG TBCR Take 1,000 mcg by mouth daily.  (Patient not taking: Reported on 12/03/2020)     Emollient (CERAVE) LOTN Apply 1 application topically daily as needed (after shower care).      ferrous sulfate 325 (65 FE) MG tablet Take 325 mg by mouth every other day.     fluticasone (FLONASE) 50 MCG/ACT nasal spray Place 1 spray into both nostrils every other day.     GLUCOSAMINE-CHONDROITIN PO Take 1 tablet by mouth 2 (two) times daily.      lidocaine (XYLOCAINE) 2 % solution Patient: Mix 1part 2% viscous lidocaine, 1part H20. Swish & swallow 72mL of diluted mixture, 14min before meals and at  bedtime, up to QID 200 mL 3   lidocaine-prilocaine (EMLA) cream Apply 1 application topically as needed (Utilize prior to port-a-cath needle access). 30 g 3   LORazepam (ATIVAN) 0.5 MG tablet Take 1 tablet (0.5 mg total) by mouth every 6 (six) hours as needed (Nausea or vomiting). 30 tablet 0   losartan (COZAAR) 25 MG tablet TAKE 1 TABLET(25 MG) BY MOUTH TWICE DAILY (Patient taking differently: Take 25 mg by mouth 2 (two) times daily.) 180 tablet 3   Multiple Vitamins-Minerals (CENTRUM ADULTS PO) Take 1 tablet by mouth daily.  (Patient not taking: Reported on 12/03/2020)     Nutritional Supplements (KATE FARMS STANDARD 1.4) LIQD 4 Bottles by Percutaneous Endoscopic Gastrostomy route daily. 1300 mL 6   Omega-3 1000 MG CAPS Take 1,000 mg by mouth 2 (two) times daily. (Patient not taking: Reported on 12/03/2020)     ondansetron (ZOFRAN) 8 MG tablet Take 1 tablet (8 mg total) by mouth 2 (two) times daily  as needed. Start on the third day after cisplatin chemotherapy. 30 tablet 1   prochlorperazine (COMPAZINE) 10 MG tablet Take 1 tablet (10 mg total) by mouth every 6 (six) hours as needed (Nausea or vomiting). 30 tablet 1   No current facility-administered medications for this visit.   Facility-Administered Medications Ordered in Other Visits  Medication Dose Route Frequency Provider Last Rate Last Admin   heparin lock flush 100 unit/mL  500 Units Intracatheter Once PRN Truitt Merle, MD       sodium chloride flush (NS) 0.9 % injection 10 mL  10 mL Intracatheter PRN Truitt Merle, MD        PHYSICAL EXAMINATION: ECOG PERFORMANCE STATUS: 2 - Symptomatic, <50% confined to bed  Vitals:   12/17/20 1003  BP: (!) 144/65  Pulse: 80  Resp: 19  Temp: (!) 97.2 F (36.2 C)  SpO2: 98%   Filed Weights   12/17/20 1003  Weight: 191 lb 11.2 oz (87 kg)    GENERAL:alert, no distress and comfortable SKIN: skin color, texture, turgor are normal, no rashes or significant lesions EYES: normal, Conjunctiva are pink  and non-injected, sclera clear OROPHARYNX:no exudate, no erythema on lips, and tongue normal; expected erythema to mucosa from radiation without ulcers  NECK: erythema from radiation Musculoskeletal:no cyanosis of digits and no clubbing  NEURO: alert & oriented x 3 with fluent speech, no focal motor/sensory deficits  LABORATORY DATA:  I have reviewed the data as listed CBC Latest Ref Rng & Units 12/17/2020 12/10/2020 12/03/2020  WBC 4.0 - 10.5 K/uL 2.5(L) 1.1(L) 2.5(L)  Hemoglobin 13.0 - 17.0 g/dL 7.5(L) 7.6(L) 8.2(L)  Hematocrit 39.0 - 52.0 % 21.5(L) 21.1(L) 23.0(L)  Platelets 150 - 400 K/uL 72(L) 35(L) 38(L)     CMP Latest Ref Rng & Units 12/17/2020 12/10/2020 12/03/2020  Glucose 70 - 99 mg/dL 132(H) 98 84  BUN 8 - 23 mg/dL 26(H) 21 23  Creatinine 0.61 - 1.24 mg/dL 1.05 0.85 1.02  Sodium 135 - 145 mmol/L 131(L) 130(L) 130(L)  Potassium 3.5 - 5.1 mmol/L 4.6 4.4 4.6  Chloride 98 - 111 mmol/L 97(L) 96(L) 95(L)  CO2 22 - 32 mmol/L 24 23 26   Calcium 8.9 - 10.3 mg/dL 8.6(L) 8.7(L) 8.9  Total Protein 6.5 - 8.1 g/dL - - -  Total Bilirubin 0.3 - 1.2 mg/dL - - -  Alkaline Phos 38 - 126 U/L - - -  AST 15 - 41 U/L - - -  ALT 0 - 44 U/L - - -     RADIOGRAPHIC STUDIES: I have personally reviewed the radiological images as listed and agreed with the findings in the report. No results found.   ASSESSMENT & PLAN:  Jeffrey [name] is a 80 y.o. 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 -He went to see his PCP who recommended an [place], which demonstrated hypoechoic mass at the right level II a highly concerning for an enlarged pathologic LN. -He was referred to ENT and CT neck was ordered. CT neck showed enlarged right level 2 node, asymmetric soft tissue at the right tongue base, favored to reflect asymmetric tonsillar issue. -Initial biopsy was inconclusive so he had an excisional biopsy  which showed P16 positive right neck poorly differentiated SCC. -PET CT showed primary mucosal neoplasm in the right tongue base area demonstrating hypermetabolism. -He was started on concurrent chemoradiation therapy with cisplatin on 11/09/20. -He developed worsening thrombocytopenia,  anemia and leukopenia following cycle 2. Chemo has been held since 11/19/20. -Labs reviewed. It appears his counts are finally starting to recover. I would recommend proceeding with reduced dose of cisplatin today. -f/u in one week    2. 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 -He is reluctant to try pain medication at this time -He is scheduled to see our dietician today and next week. -I prescribed magic mouthwash for him today to try. -We will continue to monitor.   3. 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 -No indication for platelet transfusion at this time -Labs reviewed, Hgb 7.5 today (12/17/20) -May need 1 unit PRBC if Hb less than 7   Plan: -Proceed with C3 cisplatin today with 50% dose reduction due to cytopenia  -He will go to radiation after infusion (we will alert then that he may be delayed to his appointment) -labs, f/u with Dr. Chryl Heck, and C4 cisplatin in 1 week  -will call in magic mouth wash for him     No problem-specific Assessment & Plan notes found for this encounter.   No orders of the defined types were placed in this encounter.  All questions were answered. The patient knows to call the clinic with any problems, questions or concerns. No barriers to learning was detected. The total time spent in the appointment was 30 minutes.     Truitt Merle, MD 12/17/2020   I, Wilburn Mylar, am acting as scribe for Truitt Merle, MD.   I have reviewed the above documentation for accuracy and completeness, and I agree with the  above.

## 2020-12-17 NOTE — Progress Notes (Signed)
Nutrition follow-up completed with patient during infusion.  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 Patient appears discouraged today. Weight relatively stable with weight documented today at 191 pounds June 30. Labs were reviewed. Patient reports he has not had anything to eat now for approximately 1-1/2 weeks.  He no longer tolerates liquids by mouth.  He is relying on his feeding tube to provide his nutrition and water requirements.  He denies nausea, vomiting, constipation, and diarrhea.  Estimated nutrition needs: 2000-2300 cal, 110-125 g protein, 2.3 L fluid.  Nutrition diagnosis: Food and nutrition related knowledge deficit continues.  Intervention: Educated patient to begin using feeding tube 3-4 times daily with free water before and after bolus feedings.  Patient will use 4-5 cartons of [facility] via PEG daily as tolerated.  He can also use 1 carton of Osmolite 1.5 to supplement [employer]. (Patient has Osmolite 1.5 left over and does not want to waste it.) Patient educated to give an additional 240 mL of free water 3 times a day between feedings. He understands he should flush his feeding tube with 120 mL of free water before and after bolus feedings. Provided support and encouragement. Patient has adequate tube feeding supplies and formula. He verbalizes understanding.  4 cartons [employer] [place] 1.4+1 carton Osmolite 1.5 provides 2175 cal, 94.9 g protein 5 cartons of [employer] 1.4 provides 2275 cal, 100 g protein.  Monitoring, evaluation, goals: Patient will tolerate increased tube feeding to minimize loss of lean body mass and promote healing. Will evaluate patient's ability to tolerate tube feeding and adjust as needed.  We will write new orders if necessary.  Next visit: Thursday, July 7 during infusion.  **Disclaimer: This note was dictated with voice recognition software. Similar sounding words can inadvertently be transcribed and this  note may contain transcription errors which may not have been corrected upon publication of note.**

## 2020-12-17 NOTE — Progress Notes (Signed)
Per MD Burr Medico, ok to treat with CBC today. Per MD Burr Medico, Cisplatin dose reduction.  Per MD Burr Medico, ok to proceed with urine output of 14ml

## 2020-12-17 NOTE — Patient Instructions (Signed)
[facility]  Discharge Instructions: Thank you for choosing [facility] to provide your oncology and hematology care.   If you have a lab appointment with the Cancer Center, please go directly to the Cancer Center and check in at the registration area.   Wear comfortable clothing and clothing appropriate for easy access to any Portacath or PICC line.   We strive to give you quality time with your provider. You may need to reschedule your appointment if you arrive late (15 or more minutes).  Arriving late affects you and other patients whose appointments are after yours.  Also, if you miss three or more appointments without notifying the office, you may be dismissed from the clinic at the provider's discretion.      For prescription refill requests, have your pharmacy contact our office and allow 72 hours for refills to be completed.    Today you received the following chemotherapy and/or immunotherapy agents : Cisplatin    To help prevent nausea and vomiting after your treatment, we encourage you to take your nausea medication as directed.  BELOW ARE SYMPTOMS THAT SHOULD BE REPORTED IMMEDIATELY: *FEVER GREATER THAN 100.4 F (38 C) OR HIGHER *CHILLS OR SWEATING *NAUSEA AND VOMITING THAT IS NOT CONTROLLED WITH YOUR NAUSEA MEDICATION *UNUSUAL SHORTNESS OF BREATH *UNUSUAL BRUISING OR BLEEDING *URINARY PROBLEMS (pain or burning when urinating, or frequent urination) *BOWEL PROBLEMS (unusual diarrhea, constipation, pain near the anus) TENDERNESS IN MOUTH AND THROAT WITH OR WITHOUT PRESENCE OF ULCERS (sore throat, sores in mouth, or a toothache) UNUSUAL RASH, SWELLING OR PAIN  UNUSUAL VAGINAL DISCHARGE OR ITCHING   Items with * indicate a potential emergency and should be followed up as soon as possible or go to the Emergency Department if any problems should occur.  Please show the CHEMOTHERAPY ALERT CARD or IMMUNOTHERAPY ALERT CARD at check-in to  the Emergency Department and triage nurse.  Should you have questions after your visit or need to cancel or reschedule your appointment, please contact [facility]  Dept: [phone]  and follow the prompts.  Office hours are 8:00 a.m. to 4:30 p.m. Monday - Friday. Please note that voicemails left after 4:00 p.m. may not be returned until the following business day.  We are closed weekends and major holidays. You have access to a nurse at all times for urgent questions. Please call the main number to the clinic Dept: [phone] and follow the prompts.   For any non-urgent questions, you may also contact your provider using MyChart. We now offer e-Visits for anyone 18 and older to request care online for non-urgent symptoms. For details visit [email or web address].   Also download the MyChart app! Go to the app store, search "MyChart", open the app, select [facility], and log in with your MyChart username and password.  Due to Covid, a mask is required upon entering the hospital/clinic. If you do not have a mask, one will be given to you upon arrival. For doctor visits, patients may have 1 support person aged 18 or older with them. For treatment visits, patients cannot have anyone with them due to current Covid guidelines and our immunocompromised population.   

## 2020-12-18 ENCOUNTER — Other Ambulatory Visit: Payer: Self-pay

## 2020-12-18 ENCOUNTER — Ambulatory Visit
Admission: RE | Admit: 2020-12-18 | Discharge: 2020-12-18 | Disposition: A | Discharge status: Home / Self Care | Payer: Medicare Other | Source: Ambulatory Visit | Attending: Radiation Oncology | Admitting: Radiation Oncology

## 2020-12-18 DIAGNOSIS — C109 Malignant neoplasm of oropharynx, unspecified: Secondary | ICD-10-CM | POA: Insufficient documentation

## 2020-12-18 DIAGNOSIS — Z87891 Personal history of nicotine dependence: Secondary | ICD-10-CM | POA: Diagnosis not present

## 2020-12-18 DIAGNOSIS — Z510 Encounter for antineoplastic radiation therapy: Secondary | ICD-10-CM | POA: Insufficient documentation

## 2020-12-18 DIAGNOSIS — C01 Malignant neoplasm of base of tongue: Secondary | ICD-10-CM | POA: Diagnosis not present

## 2020-12-22 ENCOUNTER — Other Ambulatory Visit: Payer: Self-pay

## 2020-12-22 ENCOUNTER — Ambulatory Visit
Admission: RE | Admit: 2020-12-22 | Discharge: 2020-12-22 | Disposition: A | Discharge status: Home / Self Care | Payer: Medicare Other | Source: Ambulatory Visit | Attending: Radiation Oncology | Admitting: Radiation Oncology

## 2020-12-22 ENCOUNTER — Other Ambulatory Visit: Payer: Self-pay | Admitting: Radiation Oncology

## 2020-12-22 DIAGNOSIS — C109 Malignant neoplasm of oropharynx, unspecified: Secondary | ICD-10-CM | POA: Diagnosis not present

## 2020-12-22 DIAGNOSIS — Z510 Encounter for antineoplastic radiation therapy: Secondary | ICD-10-CM | POA: Diagnosis not present

## 2020-12-22 DIAGNOSIS — C01 Malignant neoplasm of base of tongue: Secondary | ICD-10-CM | POA: Diagnosis not present

## 2020-12-22 DIAGNOSIS — Z87891 Personal history of nicotine dependence: Secondary | ICD-10-CM | POA: Diagnosis not present

## 2020-12-22 MED ORDER — LIDOCAINE VISCOUS HCL 2 % MT SOLN: OROMUCOSAL | 3 refills | Status: DC

## 2020-12-23 ENCOUNTER — Ambulatory Visit
Admission: RE | Admit: 2020-12-23 | Discharge: 2020-12-23 | Disposition: A | Discharge status: Home / Self Care | Payer: Medicare Other | Source: Ambulatory Visit | Attending: Radiation Oncology | Admitting: Radiation Oncology

## 2020-12-23 DIAGNOSIS — C01 Malignant neoplasm of base of tongue: Secondary | ICD-10-CM | POA: Diagnosis not present

## 2020-12-23 DIAGNOSIS — C109 Malignant neoplasm of oropharynx, unspecified: Secondary | ICD-10-CM | POA: Diagnosis not present

## 2020-12-23 DIAGNOSIS — Z510 Encounter for antineoplastic radiation therapy: Secondary | ICD-10-CM | POA: Diagnosis not present

## 2020-12-23 DIAGNOSIS — Z87891 Personal history of nicotine dependence: Secondary | ICD-10-CM | POA: Diagnosis not present

## 2020-12-24 ENCOUNTER — Encounter: Payer: Self-pay | Admitting: Hematology and Oncology

## 2020-12-24 ENCOUNTER — Ambulatory Visit
Admission: RE | Admit: 2020-12-24 | Discharge: 2020-12-24 | Disposition: A | Discharge status: Home / Self Care | Payer: Medicare Other | Source: Ambulatory Visit | Attending: Radiation Oncology | Admitting: Radiation Oncology

## 2020-12-24 ENCOUNTER — Inpatient Hospital Stay: Payer: Medicare Other | Admitting: Nutrition

## 2020-12-24 ENCOUNTER — Inpatient Hospital Stay: Payer: Medicare Other | Attending: Hematology and Oncology | Admitting: Hematology and Oncology

## 2020-12-24 ENCOUNTER — Other Ambulatory Visit: Payer: Self-pay

## 2020-12-24 ENCOUNTER — Inpatient Hospital Stay: Payer: Medicare Other

## 2020-12-24 ENCOUNTER — Encounter: Payer: Medicare Other | Admitting: Nutrition

## 2020-12-24 ENCOUNTER — Other Ambulatory Visit: Payer: Medicare Other

## 2020-12-24 VITALS — BP 149/70 | HR 79 | Temp 97.9°F | Resp 18 | Ht 70.0 in | Wt 189.3 lb

## 2020-12-24 DIAGNOSIS — Z87891 Personal history of nicotine dependence: Secondary | ICD-10-CM | POA: Diagnosis not present

## 2020-12-24 DIAGNOSIS — Z800 Family history of malignant neoplasm of digestive organs: Secondary | ICD-10-CM | POA: Diagnosis not present

## 2020-12-24 DIAGNOSIS — Z5111 Encounter for antineoplastic chemotherapy: Secondary | ICD-10-CM | POA: Insufficient documentation

## 2020-12-24 DIAGNOSIS — K1231 Oral mucositis (ulcerative) due to antineoplastic therapy: Secondary | ICD-10-CM | POA: Insufficient documentation

## 2020-12-24 DIAGNOSIS — Z931 Gastrostomy status: Secondary | ICD-10-CM | POA: Insufficient documentation

## 2020-12-24 DIAGNOSIS — C01 Malignant neoplasm of base of tongue: Secondary | ICD-10-CM | POA: Diagnosis not present

## 2020-12-24 DIAGNOSIS — C109 Malignant neoplasm of oropharynx, unspecified: Secondary | ICD-10-CM

## 2020-12-24 DIAGNOSIS — D61818 Other pancytopenia: Secondary | ICD-10-CM | POA: Diagnosis not present

## 2020-12-24 DIAGNOSIS — Z95828 Presence of other vascular implants and grafts: Secondary | ICD-10-CM

## 2020-12-24 DIAGNOSIS — D6481 Anemia due to antineoplastic chemotherapy: Secondary | ICD-10-CM | POA: Diagnosis not present

## 2020-12-24 DIAGNOSIS — R634 Abnormal weight loss: Secondary | ICD-10-CM | POA: Insufficient documentation

## 2020-12-24 DIAGNOSIS — T451X5D Adverse effect of antineoplastic and immunosuppressive drugs, subsequent encounter: Secondary | ICD-10-CM | POA: Diagnosis not present

## 2020-12-24 DIAGNOSIS — Z510 Encounter for antineoplastic radiation therapy: Secondary | ICD-10-CM | POA: Diagnosis not present

## 2020-12-24 DIAGNOSIS — D6959 Other secondary thrombocytopenia: Secondary | ICD-10-CM | POA: Insufficient documentation

## 2020-12-24 LAB — BASIC METABOLIC PANEL - CANCER CENTER ONLY
Anion gap: 8 (ref 5–15)
BUN: 27 mg/dL — ABNORMAL HIGH (ref 8–23)
CO2: 25 mmol/L (ref 22–32)
Calcium: 8.9 mg/dL (ref 8.9–10.3)
Chloride: 101 mmol/L (ref 98–111)
Creatinine: 0.99 mg/dL (ref 0.61–1.24)
GFR, Estimated: >60 mL/min (ref >60)
Glucose, Bld: 95 mg/dL (ref 70–99)
Potassium: 4.6 mmol/L (ref 3.5–5.1)
Sodium: 134 mmol/L — ABNORMAL LOW (ref 135–145)

## 2020-12-24 LAB — CBC WITH DIFFERENTIAL (CANCER CENTER ONLY)
Abs Immature Granulocytes: 0.07 10*3/uL (ref 0.00–0.07)
Basophils Absolute: 0 10*3/uL (ref 0.0–0.1)
Basophils Relative: 0 %
Eosinophils Absolute: 0 10*3/uL (ref 0.0–0.5)
Eosinophils Relative: 0 %
HCT: 22.9 % — ABNORMAL LOW (ref 39.0–52.0)
Hemoglobin: 8 g/dL — ABNORMAL LOW (ref 13.0–17.0)
Immature Granulocytes: 1 %
Lymphocytes Relative: 4 %
Lymphs Abs: 0.2 10*3/uL — ABNORMAL LOW (ref 0.7–4.0)
MCH: 34 pg (ref 26.0–34.0)
MCHC: 34.9 g/dL (ref 30.0–36.0)
MCV: 97.4 fL (ref 80.0–100.0)
Monocytes Absolute: 1.3 10*3/uL — ABNORMAL HIGH (ref 0.1–1.0)
Monocytes Relative: 26 %
Neutro Abs: 3.5 10*3/uL (ref 1.7–7.7)
Neutrophils Relative %: 69 %
Platelet Count: 111 10*3/uL — ABNORMAL LOW (ref 150–400)
RBC: 2.35 MIL/uL — ABNORMAL LOW (ref 4.22–5.81)
RDW: 17.6 % — ABNORMAL HIGH (ref 11.5–15.5)
WBC Count: 5.1 10*3/uL (ref 4.0–10.5)
nRBC: 0 % (ref 0.0–0.2)

## 2020-12-24 LAB — MAGNESIUM: Magnesium: 1.9 mg/dL (ref 1.7–2.4)

## 2020-12-24 MED ORDER — MAGNESIUM SULFATE 2 GM/50ML IV SOLN
2.0000 g | Freq: Once | INTRAVENOUS | Status: AC
Start: 2020-12-24 — End: 2020-12-24
  Administered 2020-12-24: 2 g via INTRAVENOUS

## 2020-12-24 MED ORDER — PALONOSETRON HCL INJECTION 0.25 MG/5ML
0.2500 mg | Freq: Once | INTRAVENOUS | Status: AC
  Administered 2020-12-24: 0.25 mg via INTRAVENOUS

## 2020-12-24 MED ORDER — MAGNESIUM SULFATE 2 GM/50ML IV SOLN
INTRAVENOUS | Status: AC
  Filled 2020-12-24: qty 50

## 2020-12-24 MED ORDER — POTASSIUM CHLORIDE IN NACL 20-0.9 MEQ/L-% IV SOLN
Freq: Once | INTRAVENOUS | Status: AC
Start: 2020-12-24 — End: 2020-12-24
  Filled 2020-12-24: qty 1000

## 2020-12-24 MED ORDER — SODIUM CHLORIDE 0.9% FLUSH
10.0000 mL | INTRAVENOUS | Status: DC | PRN
  Administered 2020-12-24: 10 mL
  Filled 2020-12-24: qty 10

## 2020-12-24 MED ORDER — SODIUM CHLORIDE 0.9 % IV SOLN
150.0000 mg | Freq: Once | INTRAVENOUS | Status: AC
  Administered 2020-12-24: 150 mg via INTRAVENOUS
  Filled 2020-12-24: qty 150

## 2020-12-24 MED ORDER — SODIUM CHLORIDE 0.9 % IV SOLN
10.0000 mg | Freq: Once | INTRAVENOUS | Status: AC
  Administered 2020-12-24: 10 mg via INTRAVENOUS
  Filled 2020-12-24: qty 10

## 2020-12-24 MED ORDER — PALONOSETRON HCL INJECTION 0.25 MG/5ML
INTRAVENOUS | Status: AC
  Filled 2020-12-24: qty 5

## 2020-12-24 MED ORDER — SODIUM CHLORIDE 0.9 % IV SOLN
20.0000 mg/m2 | Freq: Once | INTRAVENOUS | Status: AC
  Administered 2020-12-24: 42 mg via INTRAVENOUS
  Filled 2020-12-24: qty 42

## 2020-12-24 MED ORDER — HEPARIN SOD (PORK) LOCK FLUSH 100 UNIT/ML IV SOLN
500.0000 [IU] | Freq: Once | INTRAVENOUS | Status: AC | PRN
  Administered 2020-12-24: 500 [IU]
  Filled 2020-12-24: qty 5

## 2020-12-24 MED ORDER — SODIUM CHLORIDE 0.9% FLUSH
10.0000 mL | Freq: Once | INTRAVENOUS | Status: AC
  Administered 2020-12-24: 10 mL
  Filled 2020-12-24: qty 10

## 2020-12-24 MED ORDER — SODIUM CHLORIDE 0.9 % IV SOLN
Freq: Once | INTRAVENOUS | Status: AC
  Filled 2020-12-24: qty 250

## 2020-12-24 NOTE — Patient Instructions (Signed)
[name]  Discharge Instructions: Thank you for choosing [name] to provide your oncology and hematology care.   If you have a lab appointment with the [name], please go directly to the [name] and check in at the registration area.   Wear comfortable clothing and clothing appropriate for easy access to any Portacath or PICC line.   We strive to give you quality time with your provider. You may need to reschedule your appointment if you arrive late (15 or more minutes).  Arriving late affects you and other patients whose appointments are after yours.  Also, if you miss three or more appointments without notifying the office, you may be dismissed from the clinic at the provider's discretion.      For prescription refill requests, have your pharmacy contact our office and allow 72 hours for refills to be completed.    Today you received the following chemotherapy and/or immunotherapy agents : Cisplatin    To help prevent nausea and vomiting after your treatment, we encourage you to take your nausea medication as directed.  BELOW ARE SYMPTOMS THAT SHOULD BE REPORTED IMMEDIATELY: *FEVER GREATER THAN 100.4 F (38 C) OR HIGHER *CHILLS OR SWEATING *NAUSEA AND VOMITING THAT IS NOT CONTROLLED WITH YOUR NAUSEA MEDICATION *UNUSUAL SHORTNESS OF BREATH *UNUSUAL BRUISING OR BLEEDING *URINARY PROBLEMS (pain or burning when urinating, or frequent urination) *BOWEL PROBLEMS (unusual diarrhea, constipation, pain near the anus) TENDERNESS IN MOUTH AND THROAT WITH OR WITHOUT PRESENCE OF ULCERS (sore throat, sores in mouth, or a toothache) UNUSUAL RASH, SWELLING OR PAIN  UNUSUAL VAGINAL DISCHARGE OR ITCHING   Items with * indicate a potential emergency and should be followed up as soon as possible or go to the Emergency Department if any problems should occur.  Please show the CHEMOTHERAPY ALERT CARD or IMMUNOTHERAPY ALERT CARD at check-in to  the Emergency Department and triage nurse.  Should you have questions after your visit or need to cancel or reschedule your appointment, please contact [name]  Dept: [phone]  and follow the prompts.  Office hours are 8:00 a.m. to 4:30 p.m. Monday - Friday. Please note that voicemails left after 4:00 p.m. may not be returned until the following business day.  We are closed weekends and major holidays. You have access to a nurse at all times for urgent questions. Please call the main number to the clinic Dept: [phone] and follow the prompts.   For any non-urgent questions, you may also contact your provider using MyChart. We now offer e-Visits for anyone 18 and older to request care online for non-urgent symptoms. For details visit [email or web address].   Also download the MyChart app! Go to the app store, search "MyChart", open the app, select [facility], and log in with your MyChart username and password.  Due to Covid, a mask is required upon entering the hospital/clinic. If you do not have a mask, one will be given to you upon arrival. For doctor visits, patients may have 1 support person aged 18 or older with them. For treatment visits, patients cannot have anyone with them due to current Covid guidelines and our immunocompromised population.   

## 2020-12-24 NOTE — Progress Notes (Signed)
Nutrition follow-up completed with patient during infusion for oropharyngeal cancer. Weight decreased to 189.3 pounds on July 7 down overall from 203.7 pounds May 3.  This is a 7% decrease over 2 months. Noted labs: Glucose 132, BUN 26, sodium 131. Patient reports he is using 4-1/2 cartons of [employer] 1.4 over 3 feedings.  He instills 60 mL free water before bolus feeding and 120 mL free water after bolus feeding.  He continues to drink water by mouth.  He reports he is trying to increase to goal rate of 5 cartons daily to provide 2275 cal and 100 g of protein. Patient states he feels "crappy".  He is ready for treatment to be over. Denies other nutrition impact symptoms.  Estimated nutrition needs: 2000-2300 cal, 110-125 g protein, 2.3 L fluid.  Nutrition diagnosis: Food and nutrition related knowledge deficit improved.  Intervention: Patient educated to continue working towards 5 cartons [employer] 1.4 and encouraged over 4 feedings. Reiterated importance of adequate free water. Patient educated to consume a minimum of 3 additional cups of water daily by mouth or via tube. Provided support and encouragement.  Monitoring, evaluation, goals: Patient will tolerate adequate calories and protein to minimize weight loss and promote healing.  Next visit: Tuesday, August 9 by telephone.  Patient has contact information for questions prior to next follow-up.  **Disclaimer: This note was dictated with voice recognition software. Similar sounding words can inadvertently be transcribed and this note may contain transcription errors which may not have been corrected upon publication of note.**

## 2020-12-24 NOTE — Progress Notes (Signed)
[facility]   [phone] [phone]   Clinic Follow up Note   Patient Care Team: Vivi Barrack, MD as PCP - General (Family Medicine) Buford Dresser, MD as PCP - Cardiology (Cardiology) Chevis Pretty as Consulting Physician (Dentistry) [name], MD as Consulting Physician (Vascular Surgery) Warren Danes, PA-C as Physician Assistant (Dermatology) Malmfelt, Stephani Police, RN as Oncology Nurse Navigator Eppie Gibson, MD as Consulting Physician (Radiation Oncology) Leta Baptist, MD as Consulting Physician (Otolaryngology) Benay Pike, MD as Consulting Physician (Hematology and Oncology)  Date of Service:  12/24/2020  CHIEF COMPLAINT: f/u of oropharyngeal cancer  SUMMARY OF ONCOLOGIC HISTORY: Oncology History Overview Note  Patient first noticed to have neck mass about 2 months prior to presentation. He denies any complaints except for neck mass, no difficulty swallowing, pain at the base of the tongue. He went to see his PCP who recommended an [place] which demonstrated hypoechoic mass at the right level II a highly concerning for an enlarged pathologic LN. He was referred to ENT and CT neck was ordered. CT neck showed enlarged right level 2 node, asymmetric soft tissue at the right tongue base, favored to reflect asymmetric tonsillar issue. 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 PET CT showed primary mucosal neoplasm in the right tongue base area demonstrating hypermetabolism.    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 He says he is wiped out, tired from last few days of radiation and cisplatin.  He wonders if he can stop chemotherapy, he understands that he has not received the ideal dose of chemotherapy but he feels like he cannot handle any more treatment after this week. He is not able to eat anything by mouth, entirely fed through G-tube, weight is pretty stable, maintaining a 2000-calorie diet, lost 3 pounds since last visit.  Mucositis is very painful however he is very reluctant to try any pain medicine. No hearing loss, tinnitus or neuropathy.  No change in bowel habits or urinary habits.  All other systems were reviewed with the patient and are negative.  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   Right Buccal Cheek([facility])   Osteopenia     SURGICAL HISTORY: Past Surgical History:  Procedure Laterality Date   ABDOMINAL AORTIC ENDOVASCULAR FENESTRATED STENT GRAFT N/A 03/21/2018   Procedure: ABDOMINAL AORTIC ENDOVASCULAR FENESTRATED STENT GRAFT ; [place] CT PERFORMED;  Surgeon: Harold Barban  W, MD;  Location: [facility];  Service: Vascular;  Laterality: N/A;   Basal Skin Cancer  2012   CATARACT EXTRACTION Bilateral    IR GASTROSTOMY TUBE MOD SED  11/06/2020   IR IMAGING GUIDED PORT INSERTION  11/06/2020   MASS BIOPSY Right 09/25/2020   Procedure: OPEN NECK MASS BIOPSY;  Surgeon: Leta Baptist, MD;   Location: [facility];  Service: ENT;  Laterality: Right;   TONSILLECTOMY     TRANSURETHRAL RESECTION OF PROSTATE      ALLERGIES:  is allergic to ketotifen fumarate, mixed ragweed, and triamcinolone.  MEDICATIONS:  Current Outpatient Medications  Medication Sig Dispense Refill   acetaminophen (TYLENOL) 650 MG CR tablet Take 1,300 mg by mouth every 8 (eight) hours as needed for pain.     amLODipine (NORVASC) 5 MG tablet Take 1 tablet (5 mg total) by mouth daily. 90 tablet 3   ARTIFICIAL TEAR SOLUTION OP Place 1 drop into both eyes daily as needed (dry eyes).     aspirin EC 81 MG tablet Take 81 mg by mouth daily. Swallow whole.     atorvastatin (LIPITOR) 40 MG tablet TAKE 1 TABLET(40 MG) BY MOUTH DAILY AT 6 PM (Patient taking differently: Take 40 mg by mouth daily at 6 PM.) 90 tablet 1   Emollient (CERAVE) LOTN Apply 1 application topically daily as needed (after shower care).      ferrous sulfate 325 (65 FE) MG tablet Take 325 mg by mouth every other day.     fluticasone (FLONASE) 50 MCG/ACT nasal spray Place 1 spray into both nostrils every other day.     GLUCOSAMINE-CHONDROITIN PO Take 1 tablet by mouth 2 (two) times daily.      lidocaine (XYLOCAINE) 2 % solution Patient: Mix 1part 2% viscous lidocaine, 1part H20. Swish & swallow 43mL of diluted mixture, 57min before meals and at bedtime, up to QID 200 mL 3   lidocaine-prilocaine (EMLA) cream Apply 1 application topically as needed (Utilize prior to port-a-cath needle access). 30 g 3   LORazepam (ATIVAN) 0.5 MG tablet Take 1 tablet (0.5 mg total) by mouth every 6 (six) hours as needed (Nausea or vomiting). 30 tablet 0   losartan (COZAAR) 25 MG tablet TAKE 1 TABLET(25 MG) BY MOUTH TWICE DAILY (Patient taking differently: Take 25 mg by mouth 2 (two) times daily.) 180 tablet 3   Nutritional Supplements (KATE FARMS STANDARD 1.4) LIQD 4 Bottles by Percutaneous Endoscopic Gastrostomy route daily. 1300 mL 6   ondansetron (ZOFRAN) 8 MG  tablet Take 1 tablet (8 mg total) by mouth 2 (two) times daily as needed. Start on the third day after cisplatin chemotherapy. 30 tablet 1   prochlorperazine (COMPAZINE) 10 MG tablet Take 1 tablet (10 mg total) by mouth every 6 (six) hours as needed (Nausea or vomiting). 30 tablet 1   Ascorbic Acid (VITAMIN C) 1000 MG tablet Take 1,000 mg by mouth daily.  (Patient not taking: No sig reported)     Calcium Carbonate-Vitamin D 600-200 MG-UNIT TABS Take 1 tablet by mouth every other day.  (Patient not taking: No sig reported)     Cyanocobalamin 1000 MCG TBCR Take 1,000 mcg by mouth daily.  (Patient not taking: No sig reported)     Multiple Vitamins-Minerals (CENTRUM ADULTS PO) Take 1 tablet by mouth daily.  (Patient not taking: No sig reported)     Omega-3 1000 MG CAPS Take 1,000 mg by mouth 2 (two) times daily. (Patient not taking: No sig reported)  No current facility-administered medications for this visit.    PHYSICAL EXAMINATION: ECOG PERFORMANCE STATUS: 2 - Symptomatic, <50% confined to bed  Vitals:   12/24/20 0928  BP: (!) 149/70  Pulse: 79  Resp: 18  Temp: 97.9 F (36.6 C)  SpO2: 99%   Filed Weights   12/24/20 0928  Weight: 189 lb 4.8 oz (85.9 kg)    GENERAL:alert, appears tired. SKIN: skin color, texture, turgor are normal, no rashes or significant lesions EYES: normal, Conjunctiva are pink and non-injected, sclera clear OROPHARYNX:no visible mucositis in oral cavity, likely in the oropharynx. NECK: erythema from radiation Musculoskeletal:no cyanosis of digits and no clubbing  NEURO: alert & oriented x 3 with fluent speech, no focal motor/sensory deficits  LABORATORY DATA:  I have reviewed the data as listed CBC Latest Ref Rng & Units 12/24/2020 12/17/2020 12/10/2020  WBC 4.0 - 10.5 K/uL 5.1 2.5(L) 1.1(L)  Hemoglobin 13.0 - 17.0 g/dL 8.0(L) 7.5(L) 7.6(L)  Hematocrit 39.0 - 52.0 % 22.9(L) 21.5(L) 21.1(L)  Platelets 150 - 400 K/uL 111(L) 72(L) 35(L)     CMP Latest Ref  Rng & Units 12/17/2020 12/10/2020 12/03/2020  Glucose 70 - 99 mg/dL 132(H) 98 84  BUN 8 - 23 mg/dL 26(H) 21 23  Creatinine 0.61 - 1.24 mg/dL 1.05 0.85 1.02  Sodium 135 - 145 mmol/L 131(L) 130(L) 130(L)  Potassium 3.5 - 5.1 mmol/L 4.6 4.4 4.6  Chloride 98 - 111 mmol/L 97(L) 96(L) 95(L)  CO2 22 - 32 mmol/L 24 23 26   Calcium 8.9 - 10.3 mg/dL 8.6(L) 8.7(L) 8.9  Total Protein 6.5 - 8.1 g/dL - - -  Total Bilirubin 0.3 - 1.2 mg/dL - - -  Alkaline Phos 38 - 126 U/L - - -  AST 15 - 41 U/L - - -  ALT 0 - 44 U/L - - -     RADIOGRAPHIC STUDIES: I have personally reviewed the radiological images as listed and agreed with the findings in the report. No results found.   ASSESSMENT & PLAN:  [name] is a 80 y.o. 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 -He went to see his PCP who recommended an [place], which demonstrated hypoechoic mass at the right level II a highly concerning for an enlarged pathologic LN. -He was referred to ENT and CT neck was ordered. CT neck showed enlarged right level 2 node, asymmetric soft tissue at the right tongue base, favored to reflect asymmetric tonsillar issue. -Initial biopsy was inconclusive so he had an excisional biopsy which showed P16 positive right neck poorly differentiated SCC. -PET CT showed primary mucosal neoplasm in the right tongue base area demonstrating hypermetabolism. -He was started on concurrent chemoradiation therapy with cisplatin on 11/09/20. -He developed worsening thrombocytopenia, anemia and leukopenia following cycle 2. Chemo has been held since 11/19/20. -He got half dose of cisplatin with C3. - He is here today, feels exhaused , doesn't think he can do any more chemo after this week Even this week, he was hoping to get half the dose. He understands he only got about half the dose of what we intend to give, but he says he cant do no  more, will do half dose today and complete chemo. Labs today satisfactory to proceed.  2. 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 -He doesn't  want to try any pain medication - Advised him to atleast try it at night to get some restful sleep, since he has been waking up at night from pain - prescribed liquid hydrocodone.   3. Chemotherapy-induced cytopenia Recovering, satisfactory to proceed.   Plan: -Proceed with C4cisplatin today with 50% dose reduction due to tolerance. -No more chemo after today per patient preference. - Labs in 2 weeks.    No problem-specific Assessment & Plan notes found for this encounter.   No orders of the defined types were placed in this encounter.  All questions were answered. The patient knows to call the clinic with any problems, questions or concerns. No barriers to learning was detected. The total time spent in the appointment was 30 minutes.     Benay Pike, MD 12/24/2020

## 2020-12-24 NOTE — Progress Notes (Signed)
Per Dr.Iruku, ok to proceed with 150 ml urine output.

## 2020-12-25 ENCOUNTER — Ambulatory Visit
Admission: RE | Admit: 2020-12-25 | Discharge: 2020-12-25 | Disposition: A | Discharge status: Home / Self Care | Payer: Medicare Other | Source: Ambulatory Visit | Attending: Radiation Oncology | Admitting: Radiation Oncology

## 2020-12-25 ENCOUNTER — Other Ambulatory Visit: Payer: Self-pay | Admitting: Hematology and Oncology

## 2020-12-25 DIAGNOSIS — C01 Malignant neoplasm of base of tongue: Secondary | ICD-10-CM | POA: Diagnosis not present

## 2020-12-25 DIAGNOSIS — Z87891 Personal history of nicotine dependence: Secondary | ICD-10-CM | POA: Diagnosis not present

## 2020-12-25 DIAGNOSIS — C109 Malignant neoplasm of oropharynx, unspecified: Secondary | ICD-10-CM | POA: Diagnosis not present

## 2020-12-25 DIAGNOSIS — Z510 Encounter for antineoplastic radiation therapy: Secondary | ICD-10-CM | POA: Diagnosis not present

## 2020-12-25 MED ORDER — HYDROCODONE-ACETAMINOPHEN 7.5-325 MG/15ML PO SOLN: 10.0000 mL | Freq: Two times a day (BID) | ORAL | 0 refills | Status: DC | PRN

## 2020-12-25 NOTE — Progress Notes (Signed)
Hydrocodone prescription sent.  [name]

## 2020-12-28 ENCOUNTER — Ambulatory Visit
Admission: RE | Admit: 2020-12-28 | Discharge: 2020-12-28 | Disposition: A | Discharge status: Home / Self Care | Payer: Medicare Other | Source: Ambulatory Visit | Attending: Radiation Oncology | Admitting: Radiation Oncology

## 2020-12-28 ENCOUNTER — Other Ambulatory Visit: Payer: Self-pay

## 2020-12-28 DIAGNOSIS — Z87891 Personal history of nicotine dependence: Secondary | ICD-10-CM | POA: Diagnosis not present

## 2020-12-28 DIAGNOSIS — C01 Malignant neoplasm of base of tongue: Secondary | ICD-10-CM | POA: Diagnosis not present

## 2020-12-28 DIAGNOSIS — Z510 Encounter for antineoplastic radiation therapy: Secondary | ICD-10-CM | POA: Diagnosis not present

## 2020-12-28 DIAGNOSIS — C109 Malignant neoplasm of oropharynx, unspecified: Secondary | ICD-10-CM | POA: Diagnosis not present

## 2020-12-29 ENCOUNTER — Encounter: Payer: Self-pay | Admitting: Radiation Oncology

## 2020-12-29 ENCOUNTER — Ambulatory Visit
Admission: RE | Admit: 2020-12-29 | Discharge: 2020-12-29 | Disposition: A | Discharge status: Home / Self Care | Payer: Medicare Other | Source: Ambulatory Visit | Attending: Radiation Oncology | Admitting: Radiation Oncology

## 2020-12-29 DIAGNOSIS — C01 Malignant neoplasm of base of tongue: Secondary | ICD-10-CM | POA: Diagnosis not present

## 2020-12-29 DIAGNOSIS — Z87891 Personal history of nicotine dependence: Secondary | ICD-10-CM | POA: Diagnosis not present

## 2020-12-29 DIAGNOSIS — C109 Malignant neoplasm of oropharynx, unspecified: Secondary | ICD-10-CM | POA: Diagnosis not present

## 2020-12-29 DIAGNOSIS — Z510 Encounter for antineoplastic radiation therapy: Secondary | ICD-10-CM | POA: Diagnosis not present

## 2020-12-29 NOTE — Progress Notes (Signed)
Oncology Nurse Navigator Documentation   Met with [name] after final RT to offer support and to celebrate end of radiation treatment.   Provided verbal/written post-RT guidance: Importance of keeping all follow-up appts, especially those with Nutrition and SLP. Importance of protecting treatment area from sun. Continuation of Sonafine application 2-3 times daily, application of antibiotic ointment to areas of raw skin; when supply of Sonafine exhausted transition to OTC lotion with vitamin E. Provided/reviewed Epic calendar of upcoming appts. Explained my role as navigator will continue for several more months, encouraged him to call me with needs/concerns.    Jeffrey Asa RN, BSN, OCN Head & Neck Oncology Nurse [place] [facility] Phone # [phone]  Fax # [phone]

## 2020-12-30 ENCOUNTER — Telehealth: Payer: Self-pay | Admitting: Hematology and Oncology

## 2020-12-30 NOTE — Telephone Encounter (Signed)
Scheduled per 07/07 los, patient has been called and voicemail was left. 

## 2020-12-31 ENCOUNTER — Telehealth (HOSPITAL_BASED_OUTPATIENT_CLINIC_OR_DEPARTMENT_OTHER): Payer: Self-pay | Admitting: Cardiology

## 2020-12-31 ENCOUNTER — Other Ambulatory Visit: Payer: Self-pay | Admitting: Cardiology

## 2020-12-31 DIAGNOSIS — I10 Essential (primary) hypertension: Secondary | ICD-10-CM

## 2020-12-31 NOTE — Telephone Encounter (Signed)
Left message for patient to call and schedule yearly visit with Dr. Harrell Gave

## 2020-12-31 NOTE — Telephone Encounter (Signed)
Patient called back he states he will call back next month, because he just finished treatment for tongue and throat cancer.

## 2021-01-06 ENCOUNTER — Inpatient Hospital Stay: Payer: Medicare Other | Admitting: Hematology and Oncology

## 2021-01-06 ENCOUNTER — Other Ambulatory Visit: Payer: Self-pay | Admitting: Hematology and Oncology

## 2021-01-06 ENCOUNTER — Ambulatory Visit: Payer: Medicare Other | Admitting: Physician Assistant

## 2021-01-06 ENCOUNTER — Encounter: Payer: Self-pay | Admitting: Hematology and Oncology

## 2021-01-06 ENCOUNTER — Inpatient Hospital Stay: Payer: Medicare Other

## 2021-01-06 ENCOUNTER — Other Ambulatory Visit: Payer: Self-pay

## 2021-01-06 VITALS — BP 143/68 | HR 74 | Temp 97.6°F | Resp 20 | Ht 70.0 in | Wt 185.8 lb

## 2021-01-06 DIAGNOSIS — C109 Malignant neoplasm of oropharynx, unspecified: Secondary | ICD-10-CM | POA: Diagnosis not present

## 2021-01-06 DIAGNOSIS — D6959 Other secondary thrombocytopenia: Secondary | ICD-10-CM | POA: Diagnosis not present

## 2021-01-06 DIAGNOSIS — D6481 Anemia due to antineoplastic chemotherapy: Secondary | ICD-10-CM | POA: Diagnosis not present

## 2021-01-06 DIAGNOSIS — T451X5D Adverse effect of antineoplastic and immunosuppressive drugs, subsequent encounter: Secondary | ICD-10-CM | POA: Diagnosis not present

## 2021-01-06 DIAGNOSIS — D61818 Other pancytopenia: Secondary | ICD-10-CM | POA: Diagnosis not present

## 2021-01-06 DIAGNOSIS — Z800 Family history of malignant neoplasm of digestive organs: Secondary | ICD-10-CM | POA: Diagnosis not present

## 2021-01-06 DIAGNOSIS — Z931 Gastrostomy status: Secondary | ICD-10-CM | POA: Diagnosis not present

## 2021-01-06 DIAGNOSIS — K1231 Oral mucositis (ulcerative) due to antineoplastic therapy: Secondary | ICD-10-CM | POA: Diagnosis not present

## 2021-01-06 DIAGNOSIS — Z5111 Encounter for antineoplastic chemotherapy: Secondary | ICD-10-CM | POA: Diagnosis not present

## 2021-01-06 DIAGNOSIS — Z87891 Personal history of nicotine dependence: Secondary | ICD-10-CM | POA: Diagnosis not present

## 2021-01-06 DIAGNOSIS — R634 Abnormal weight loss: Secondary | ICD-10-CM | POA: Diagnosis not present

## 2021-01-06 LAB — CBC WITH DIFFERENTIAL (CANCER CENTER ONLY)
Abs Immature Granulocytes: 0.02 10*3/uL (ref 0.00–0.07)
Basophils Absolute: 0 10*3/uL (ref 0.0–0.1)
Basophils Relative: 0 %
Eosinophils Absolute: 0 10*3/uL (ref 0.0–0.5)
Eosinophils Relative: 0 %
HCT: 25.6 % — ABNORMAL LOW (ref 39.0–52.0)
Hemoglobin: 8.5 g/dL — ABNORMAL LOW (ref 13.0–17.0)
Immature Granulocytes: 1 %
Lymphocytes Relative: 7 %
Lymphs Abs: 0.3 10*3/uL — ABNORMAL LOW (ref 0.7–4.0)
MCH: 34.1 pg — ABNORMAL HIGH (ref 26.0–34.0)
MCHC: 33.2 g/dL (ref 30.0–36.0)
MCV: 102.8 fL — ABNORMAL HIGH (ref 80.0–100.0)
Monocytes Absolute: 1.2 10*3/uL — ABNORMAL HIGH (ref 0.1–1.0)
Monocytes Relative: 27 %
Neutro Abs: 2.9 10*3/uL (ref 1.7–7.7)
Neutrophils Relative %: 65 %
Platelet Count: 77 10*3/uL — ABNORMAL LOW (ref 150–400)
RBC: 2.49 MIL/uL — ABNORMAL LOW (ref 4.22–5.81)
RDW: 19.4 % — ABNORMAL HIGH (ref 11.5–15.5)
WBC Count: 4.4 10*3/uL (ref 4.0–10.5)
nRBC: 0 % (ref 0.0–0.2)

## 2021-01-06 LAB — BASIC METABOLIC PANEL - CANCER CENTER ONLY
Anion gap: 11 (ref 5–15)
BUN: 28 mg/dL — ABNORMAL HIGH (ref 8–23)
CO2: 28 mmol/L (ref 22–32)
Calcium: 9.4 mg/dL (ref 8.9–10.3)
Chloride: 94 mmol/L — ABNORMAL LOW (ref 98–111)
Creatinine: 1.02 mg/dL (ref 0.61–1.24)
GFR, Estimated: >60 mL/min (ref >60)
Glucose, Bld: 110 mg/dL — ABNORMAL HIGH (ref 70–99)
Potassium: 4.5 mmol/L (ref 3.5–5.1)
Sodium: 133 mmol/L — ABNORMAL LOW (ref 135–145)

## 2021-01-06 LAB — MAGNESIUM: Magnesium: 2.1 mg/dL (ref 1.7–2.4)

## 2021-01-06 NOTE — Progress Notes (Signed)
[facility]   [phone] [phone]   Clinic Follow up Note   Patient Care Team: Vivi Barrack, MD as PCP - General (Family Medicine) Buford Dresser, MD as PCP - Cardiology (Cardiology) Chevis Pretty as Consulting Physician (Dentistry) [name], MD as Consulting Physician (Vascular Surgery) Warren Danes, PA-C as Physician Assistant (Dermatology) Malmfelt, Stephani Police, RN as Oncology Nurse Navigator Eppie Gibson, MD as Consulting Physician (Radiation Oncology) Leta Baptist, MD as Consulting Physician (Otolaryngology) Benay Pike, MD as Consulting Physician (Hematology and Oncology)  Date of Service:  01/06/2021  CHIEF COMPLAINT: f/u of oropharyngeal cancer  SUMMARY OF ONCOLOGIC HISTORY: Oncology History Overview Note  Patient first noticed to have neck mass about 2 months prior to presentation. He denies any complaints except for neck mass, no difficulty swallowing, pain at the base of the tongue. He went to see his PCP who recommended an [place] which demonstrated hypoechoic mass at the right level II a highly concerning for an enlarged pathologic LN. He was referred to ENT and CT neck was ordered. CT neck showed enlarged right level 2 node, asymmetric soft tissue at the right tongue base, favored to reflect asymmetric tonsillar issue. 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 PET CT showed primary mucosal neoplasm in the right tongue base area demonstrating hypermetabolism.    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HCC)  10/21/2020 Initial Diagnosis   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[place])    10/21/2020 Cancer Staging   Staging form: Pharynx - HPV-Mediated Oropharynx, AJCC 8th Edition - Clinical stage from 10/21/2020: Stage I (cT1, cN1, cM0, p16+) - Signed by Benay Pike, MD on 10/21/2020  Stage prefix: Initial diagnosis     11/10/2020 -  Chemotherapy    Patient is on Treatment Plan: HEAD/NECK CISPLATIN Q7D          CURRENT THERAPY:   Cisplatin q7d, received 100 mg/m2 total, multiple delays due to cytopenias and poor tolerance. Concurrent radiation therapy, through 12/29/20  INTERVAL HISTORY:   Jeffrey [name] is here for a follow up of oropharyngeal cancer. He completed radiation on 12/29/2020.  He had 4 cycles of chemo over the last 2 weekly cycles of cisplatin were already given at 20 mg per metered square.  He tells me that he is exhausted, he understands that he did not receive the ideal dose of chemotherapy but there is no way he could have gotten any more treatment.  He has very painful swallowing, has been taking hydrocodone mostly at night to avoid daytime drowsiness, using baking soda rinses.  Nutrition is 100% through G-tube, trying to get in about 2000 cal a day.  No hearing loss, tinnitus or neuropathy.  No change in bowel habits or urinary habits.  All other systems were reviewed with the patient and are negative.  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   Right Buccal Cheek([facility])   Osteopenia     SURGICAL HISTORY: Past Surgical History:  Procedure Laterality Date   ABDOMINAL AORTIC ENDOVASCULAR FENESTRATED STENT GRAFT N/A 03/21/2018   Procedure: ABDOMINAL AORTIC ENDOVASCULAR  FENESTRATED STENT GRAFT ; [place] CT PERFORMED;  Surgeon: [name], MD;  Location: [facility];  Service: Vascular;  Laterality: N/A;   Basal Skin Cancer  2012   CATARACT EXTRACTION Bilateral    IR GASTROSTOMY TUBE MOD SED  11/06/2020   IR IMAGING GUIDED PORT INSERTION  11/06/2020   MASS BIOPSY Right 09/25/2020    Procedure: OPEN NECK MASS BIOPSY;  Surgeon: Leta Baptist, MD;  Location: [facility];  Service: ENT;  Laterality: Right;   TONSILLECTOMY     TRANSURETHRAL RESECTION OF PROSTATE      ALLERGIES:  is allergic to ketotifen fumarate, mixed ragweed, and triamcinolone.  MEDICATIONS:  Current Outpatient Medications  Medication Sig Dispense Refill   amLODipine (NORVASC) 5 MG tablet Take 1 tablet (5 mg total) by mouth daily. 90 tablet 3   ARTIFICIAL TEAR SOLUTION OP Place 1 drop into both eyes daily as needed (dry eyes).     aspirin EC 81 MG tablet Take 81 mg by mouth daily. Swallow whole.     atorvastatin (LIPITOR) 40 MG tablet TAKE 1 TABLET(40 MG) BY MOUTH DAILY AT 6 PM (Patient taking differently: Take 40 mg by mouth daily at 6 PM.) 90 tablet 1   Emollient (CERAVE) LOTN Apply 1 application topically daily as needed (after shower care).      ferrous sulfate 325 (65 FE) MG tablet Take 325 mg by mouth every other day.     fluticasone (FLONASE) 50 MCG/ACT nasal spray Place 1 spray into both nostrils every other day.     GLUCOSAMINE-CHONDROITIN PO Take 1 tablet by mouth 2 (two) times daily.      HYDROcodone-acetaminophen (HYCET) 7.5-325 mg/15 ml solution Take 10 mLs by mouth every 12 (twelve) hours as needed for moderate pain. 120 mL 0   lidocaine (XYLOCAINE) 2 % solution Patient: Mix 1part 2% viscous lidocaine, 1part H20. Swish & swallow 65mL of diluted mixture, 71min before meals and at bedtime, up to QID 200 mL 3   lidocaine-prilocaine (EMLA) cream Apply 1 application topically as needed (Utilize prior to port-a-cath needle access). 30 g 3   losartan (COZAAR) 25 MG tablet TAKE 1 TABLET(25 MG) BY MOUTH TWICE DAILY (Patient taking differently: Take 25 mg by mouth 2 (two) times daily.) 180 tablet 3   Nutritional Supplements (KATE FARMS STANDARD 1.4) LIQD 4 Bottles by Percutaneous Endoscopic Gastrostomy route daily. 1300 mL 6   Ascorbic Acid (VITAMIN C) 1000 MG tablet Take 1,000 mg by mouth  daily.  (Patient not taking: No sig reported)     Cyanocobalamin 1000 MCG TBCR Take 1,000 mcg by mouth daily.  (Patient not taking: No sig reported)     Omega-3 1000 MG CAPS Take 1,000 mg by mouth 2 (two) times daily. (Patient not taking: No sig reported)     No current facility-administered medications for this visit.    PHYSICAL EXAMINATION: ECOG PERFORMANCE STATUS: 2 - Symptomatic, <50% confined to bed  Vitals:   01/06/21 1122  BP: (!) 143/68  Pulse: 74  Resp: 20  Temp: 97.6 F (36.4 C)  SpO2: 100%    Filed Weights   01/06/21 1122  Weight: 185 lb 12.8 oz (84.3 kg)     GENERAL:alert, appears tired. SKIN: skin color, texture, turgor are normal, no rashes or significant lesions EYES: normal, Conjunctiva are pink and non-injected, sclera clear OROPHARYNX:no visible mucositis in oral cavity, likely in the oropharynx. NECK: erythema from radiation Musculoskeletal:no cyanosis of digits and no clubbing  NEURO: alert & oriented  x 3 with fluent speech, no focal motor/sensory deficits  LABORATORY DATA:  I have reviewed the data as listed CBC Latest Ref Rng & Units 01/06/2021 12/24/2020 12/17/2020  WBC 4.0 - 10.5 K/uL 4.4 5.1 2.5(L)  Hemoglobin 13.0 - 17.0 g/dL 8.5(L) 8.0(L) 7.5(L)  Hematocrit 39.0 - 52.0 % 25.6(L) 22.9(L) 21.5(L)  Platelets 150 - 400 K/uL 77(L) 111(L) 72(L)     CMP Latest Ref Rng & Units 01/06/2021 12/24/2020 12/17/2020  Glucose 70 - 99 mg/dL 110(H) 95 132(H)  BUN 8 - 23 mg/dL 28(H) 27(H) 26(H)  Creatinine 0.61 - 1.24 mg/dL 1.02 0.99 1.05  Sodium 135 - 145 mmol/L 133(L) 134(L) 131(L)  Potassium 3.5 - 5.1 mmol/L 4.5 4.6 4.6  Chloride 98 - 111 mmol/L 94(L) 101 97(L)  CO2 22 - 32 mmol/L 28 25 24   Calcium 8.9 - 10.3 mg/dL 9.4 8.9 8.6(L)  Total Protein 6.5 - 8.1 g/dL - - -  Total Bilirubin 0.3 - 1.2 mg/dL - - -  Alkaline Phos 38 - 126 U/L - - -  AST 15 - 41 U/L - - -  ALT 0 - 44 U/L - - -     RADIOGRAPHIC STUDIES: I have personally reviewed the radiological  images as listed and agreed with the findings in the report. No results found.   ASSESSMENT & PLAN:  Jeffrey [name] is a 80 y.o. male with   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[place]), TxN1M0, HPV(+) -Patient first noticed to have neck mass about 2 months prior to presentation. -He went to see his PCP who recommended an [place], which demonstrated hypoechoic mass at the right level II a highly concerning for an enlarged pathologic LN. -He was referred to ENT and CT neck was ordered. CT neck showed enlarged right level 2 node, asymmetric soft tissue at the right tongue base, favored to reflect asymmetric tonsillar issue. -Initial biopsy was inconclusive so he had an excisional biopsy which showed P16 positive right neck poorly differentiated SCC. -PET CT showed primary mucosal neoplasm in the right tongue base area demonstrating hypermetabolism. -He was started on concurrent chemoradiation therapy with cisplatin on 11/09/20. -He developed worsening thrombocytopenia, anemia and leukopenia following cycle 2. Chemo has been held since 11/19/20. -He overall got 100 max per metered square of cisplatin, multiple delays due to cytopenias in the last 2 cycles, he only got 20 mg per metered square.  Cycle 4 lower dose was patient's preference, he he was exhausted by that time.  He tells me that he cannot take this again if he were to have a recurrence.  He will follow-up with Dr. Isidore Moos after PET/CT approximately in October.  I will also see him back in about 4 weeks since his cytopenias remain uncorrected at this time.  Clinically he has responded well, no concern about residual disease or progressive disease.  2. Mucositis due to antineoplastic therapy -He is currently using as needed liquid hydrocodone, baking soda rinses, most of the nutrition is through G-tube, swallowing is very painful.  He will contact us when he is in need of refill for his liquid hydrocodone.   3. Chemotherapy-induced cytopenia No  immediate transfusion needs.  He will return to clinic for follow-up in 4 weeks with repeat labs.   Plan: -Completed chemotherapy and radiation, not very well-tolerated. Return to clinic in 4 weeks with repeat labs.   No problem-specific Assessment & Plan notes found for this encounter.   No orders of the defined types were placed in this encounter.  All questions were answered. The patient knows to call the clinic with any problems, questions or concerns. No barriers to learning was detected. The total time spent in the appointment was 30 minutes.     Benay Pike, MD 01/06/2021

## 2021-01-08 ENCOUNTER — Other Ambulatory Visit: Payer: Self-pay

## 2021-01-08 ENCOUNTER — Encounter: Payer: Self-pay | Admitting: Hematology and Oncology

## 2021-01-08 MED ORDER — HYDROCODONE-ACETAMINOPHEN 7.5-325 MG/15ML PO SOLN: 10.0000 mL | Freq: Two times a day (BID) | ORAL | 0 refills | Status: DC | PRN

## 2021-01-08 NOTE — Telephone Encounter (Signed)
Spoke with patient to schedule his yearly visit with Dr. [place] Caster states he has throat cancer and is currently receiving treatment.  He states he will call back at the end of August to schedule.

## 2021-01-11 ENCOUNTER — Other Ambulatory Visit: Payer: Self-pay

## 2021-01-11 ENCOUNTER — Ambulatory Visit: Payer: Medicare Other | Attending: Radiation Oncology

## 2021-01-11 ENCOUNTER — Ambulatory Visit
Admission: RE | Admit: 2021-01-11 | Discharge: 2021-01-11 | Disposition: A | Discharge status: Home / Self Care | Payer: Medicare Other | Source: Ambulatory Visit | Attending: Radiation Oncology | Admitting: Radiation Oncology

## 2021-01-11 VITALS — BP 137/81 | HR 77 | Temp 97.6°F | Resp 20 | Ht 69.0 in | Wt 182.8 lb

## 2021-01-11 DIAGNOSIS — R5383 Other fatigue: Secondary | ICD-10-CM | POA: Insufficient documentation

## 2021-01-11 DIAGNOSIS — C01 Malignant neoplasm of base of tongue: Secondary | ICD-10-CM | POA: Diagnosis not present

## 2021-01-11 DIAGNOSIS — C109 Malignant neoplasm of oropharynx, unspecified: Secondary | ICD-10-CM

## 2021-01-11 DIAGNOSIS — R1310 Dysphagia, unspecified: Secondary | ICD-10-CM | POA: Diagnosis not present

## 2021-01-11 DIAGNOSIS — I890 Lymphedema, not elsewhere classified: Secondary | ICD-10-CM | POA: Insufficient documentation

## 2021-01-11 DIAGNOSIS — Z79899 Other long term (current) drug therapy: Secondary | ICD-10-CM | POA: Insufficient documentation

## 2021-01-11 DIAGNOSIS — Z7982 Long term (current) use of aspirin: Secondary | ICD-10-CM | POA: Insufficient documentation

## 2021-01-11 DIAGNOSIS — Z923 Personal history of irradiation: Secondary | ICD-10-CM | POA: Insufficient documentation

## 2021-01-11 NOTE — Progress Notes (Signed)
Jeffrey [name] presents today for follow-up after completing radiation to his oropharynx on 12/29/2020  Pain issues, if any: Reports throat is still sore/burning sensation (mainly from having to cough frequently to clear thick secretions) Using a feeding tube?: Yes--still getting all nutrition through PEG Weight changes, if any:  Wt Readings from Last 3 Encounters:  01/11/21 182 lb 12.8 oz (82.9 kg)  01/06/21 185 lb 12.8 oz (84.3 kg)  12/24/20 189 lb 4.8 oz (85.9 kg)   Swallowing issues, if any: Yes--had F/U with Bridgett Larsson today, and while he was able to swallow applesauce, patient reports it was painful and he doesn't feel ready to try more solid foods  Smoking or chewing tobacco? None Using fluoride trays daily? N/A Last ENT visit was on: Not since diagnosis Other notable issues, if any: Reports occasional soreness to the right side of his jaw. 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 Skin remains mildly hyperpigmented but appears intact. Reports he feels much better than he did 2 weeks ago, but is still struggling with fatigue.  Vitals:   01/11/21 1425  BP: 137/81  Pulse: 77  Resp: 20  Temp: 97.6 F (36.4 C)  SpO2: 100%

## 2021-01-11 NOTE — Therapy (Signed)
[facility] [address] [place], [place], [address] Phone: [phone]   Fax:  [phone]  Speech Language Pathology Treatment  Patient Details  Name: Jeffrey [name] MRN: [id number] Date of Birth: 1940/09/20 Referring Provider (SLP): Jeffrey Gibson, MD   Encounter Date: 01/11/2021   End of Session - 01/11/21 1704     Visit Number 2    Number of Visits 4    Date for SLP Re-Evaluation 03/08/21    SLP Start Time 56    SLP Stop Time  37    SLP Time Calculation (min) 39 min    Activity Tolerance Patient tolerated treatment well             Past Medical History:  Diagnosis Date   AAA (abdominal aortic aneurysm) ([facility])    Allergy    Arthritis    Colon polyps    Coronary artery calcification    Eczematous dermatitis    GERD (gastroesophageal reflux disease)    Hypertension    Iliac artery stenosis, left (HCC)    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 CT PERFORMED;  Surgeon: Jeffrey [name], MD;  Location: [facility];  Service: Vascular;  Laterality: N/A;   Basal Skin Cancer  2012   CATARACT EXTRACTION Bilateral    IR GASTROSTOMY TUBE MOD SED  11/06/2020   IR IMAGING GUIDED PORT INSERTION  11/06/2020   MASS BIOPSY Right 09/25/2020   Procedure: OPEN NECK MASS BIOPSY;  Surgeon: Leta Baptist, MD;  Location: [facility];  Service: ENT;  Laterality: Right;   TONSILLECTOMY     TRANSURETHRAL RESECTION OF PROSTATE      There were no vitals filed for this visit.   Subjective Assessment - 01/11/21 1323     Subjective "I feel better than I did two weeks ago."    Currently in Pain? Yes    Pain Location Throat                   ADULT SLP TREATMENT -  01/11/21 1325       General Information   Behavior/Cognition Alert;Cooperative;Pleasant mood      Treatment Provided   Treatment provided Dysphagia      Dysphagia Treatment   Temperature Spikes Noted No    Respiratory Status Room air    Treatment Methods Skilled observation;Therapeutic exercise;Patient/caregiver education    Pharyngeal Phase Signs & Symptoms Immediate throat clear   cleared throughout - due to thick saliva(?)   Other treatment/comments Pt's throat "feels like it's burning up." Clearing throat throughout session. Pt reports he has had difficulty with swallowing exercises due to discomfort and thickened saliva, but has done chin tuck against resisitance, pitch raise, but has done limited of the others. SLP had to tell pt rationale for HEP as pt told a general reason. He req'd min-mod A with [place], pitch raise, supersupraglottic, and chin tuck against resistance. SLP educated pt on purees and soft foods - suggested pt talk with Jeffrey [name] if he needs ideas of what to eat with pureed and soft, mushy solids.      Pain Assessment   Pain Assessment 0-10    Pain Score 4     Pain Location throat      Assessment /  Recommendations / Plan   Plan Continue with current plan of care              SLP Education - 01/11/21 1704     Education Details HEP exercises (see note)    Person(s) Educated Patient    Methods Explanation;Demonstration;Handout;Verbal cues    Comprehension Verbalized understanding;Returned demonstration;Verbal cues required;Need further instruction              SLP SHORT TERM GOAL #1     Title pt will tell SLP why pt is completing HEP with modified independence    Time     Period --   visits, for all STGs    Status Not met (SLP provided total A)           SLP SHORT TERM GOAL #2    Title pt will complete HEP with rare min A in 2 sessions    Time 1    Status Ongoing           SLP SHORT TERM GOAL #3    Title pt will describe 3 overt s/s  aspiration PNA with modified independence    Time 2    Status Ongoing           SLP SHORT TERM GOAL #4    Title pt will tell SLP how a food journal could hasten return to a more normalized diet    Time 2    Status Ongoing                    SLP Long Term Goals - 11/26/20 1637                     SLP LONG TERM GOAL #1    Title pt will complete HEP with modified independence over tree visits    Time 3    Period --   visits, for all LTGs    Status Ongoing           SLP LONG TERM GOAL #2    Title pt will complete HEP with independence over two visits    Time 4    Status Ongoing           SLP LONG TERM GOAL #3    Title pt will describe how to modify HEP over time, and the timeline associated with reduction in HEP frequency with modified independence over two sessions    Time 5    Status Ongoing         Plan - 01/11/21 1705     Clinical Impression Statement At this time Jeffrey [name]'s swallowing is deemed WNL/WFL with smaller bites of puree (dys I) and thin liquids -  pt has cont'd to use PEG tube for primary nutrition due to ageusia/dysgeusia, but has been drinking water each day - SLP congratulated pt with this and strongly encouraged him to continue this practice. SLP reviewed his individualized HEP for dysphagia and pt completed each exercise on their own with occasional SLP cues faded to modified independent. There are no overt s/s aspiration reported by pt at this time. Data indicate that pt's swallow ability will likely decrease over the course of radiation therapy and could very well decline over time following conclusion of their radiation therapy due to muscle disuse atrophy and/or muscle fibrosis. Pt will cont to need to be seen by SLP in order to assess safety of PO intake, assess the need for recommending any objective swallow assessment, and ensuring  pt correctly completes the individualized HEP.    Speech Therapy Frequency --   once approx every 4 weeks   Duration 12 weeks     Treatment/Interventions Aspiration precaution training;Pharyngeal strengthening exercises;Diet toleration management by SLP;Trials of upgraded texture/liquids;Patient/family education;SLP instruction and feedback;Internal/external aids    Potential to Achieve Goals Good    SLP Home Exercise Plan provided today    Consulted and Agree with Plan of Care Patient             Patient will benefit from skilled therapeutic intervention in order to improve the following deficits and impairments:   Dysphagia, unspecified type    Problem List Patient Active Problem List   Diagnosis Date Noted   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[facility]) 12/10/2020   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   Mucositis due to antineoplastic therapy 11/26/2020   Drug induced constipation 11/26/2020   Hypomagnesemia 11/26/2020   Normocytic normochromic anemia 11/19/2020   Port-A-Cath in place 11/09/2020   History of drug allergy 11/09/2020   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[facility]) 10/21/2020   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 ,[place], [place]  01/11/2021, 5:08 PM  [facility] [address] [place], [place], [address] Phone: [phone]   Fax:  [phone]   Name: Jeffrey [name] MRN: [id number] Date of Birth: 06/11/1941

## 2021-01-12 ENCOUNTER — Encounter: Payer: Self-pay | Admitting: Radiation Oncology

## 2021-01-12 NOTE — Progress Notes (Signed)
Radiation Oncology         [phone] ________________________________  Name: Jeffrey [name] MRN: [id number]  Date: 01/11/2021  DOB: [birth year]  Follow-Up Visit Note  CC: Jeffrey Barrack, MD  Jeffrey Baptist, MD  Diagnosis and Prior Radiotherapy:       ICD-10-CM   1. 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  Jeffrey [name] presents today for follow-up after completing radiation to his oropharynx on 12/29/2020  Pain issues, if any: Reports throat is still sore/burning sensation (mainly from having to cough frequently to clear thick secretions) Using a feeding tube?: Yes--still getting all nutrition through PEG Weight changes, if any:  Wt Readings from Last 3 Encounters:  01/11/21 182 lb 12.8 oz (82.9 kg)  01/06/21 185 lb 12.8 oz (84.3 kg)  12/24/20 189 lb 4.8 oz (85.9 kg)   Swallowing issues, if any: Yes--had F/U with Jeffrey [name] today, and while he was able to swallow applesauce, patient reports it was painful and he doesn't feel ready to try more solid foods  Smoking or chewing tobacco? None Using fluoride trays daily? N/A Last ENT visit was on: Not since diagnosis Other notable issues, if any: Reports occasional soreness to the right side of his jaw. 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 Skin remains mildly hyperpigmented but appears intact. Reports he feels much better than he did 2 weeks ago, but is still struggling with fatigue.  Vitals:   01/11/21 1425  BP: 137/81  Pulse: 77  Resp: 20  Temp: 97.6 F (36.4  C)  SpO2: 100%                       ALLERGIES:  is allergic to ketotifen fumarate, mixed ragweed, and triamcinolone.  Meds: Current Outpatient Medications  Medication Sig Dispense Refill   amLODipine (NORVASC) 5 MG tablet Take 1 tablet (5 mg total) by mouth daily. 90 tablet 3   ARTIFICIAL TEAR SOLUTION OP Place 1 drop into both eyes daily as needed (dry eyes).     Ascorbic Acid (VITAMIN C) 1000 MG tablet Take 1,000 mg by mouth daily.  (Patient not taking: No sig reported)     aspirin EC 81 MG tablet Take 81 mg by mouth daily. Swallow whole.     atorvastatin (LIPITOR) 40 MG tablet TAKE 1 TABLET(40 MG) BY MOUTH DAILY AT 6 PM (Patient taking differently: Take 40 mg by mouth daily at 6 PM.) 90 tablet 1   Cyanocobalamin 1000 MCG TBCR Take 1,000 mcg by mouth daily.  (Patient not taking: No sig reported)     Emollient (CERAVE) LOTN Apply 1 application topically daily as needed (after shower care).      ferrous sulfate 325 (65 FE) MG tablet Take 325 mg by mouth every other day.     fluticasone (FLONASE) 50 MCG/ACT nasal spray Place 1 spray into both nostrils every other day.     GLUCOSAMINE-CHONDROITIN PO Take 1 tablet by mouth 2 (two) times daily.      HYDROcodone-acetaminophen (HYCET)  7.5-325 mg/15 ml solution Take 10 mLs by mouth every 12 (twelve) hours as needed for moderate pain. 120 mL 0   lidocaine (XYLOCAINE) 2 % solution Patient: Mix 1part 2% viscous lidocaine, 1part H20. Swish & swallow 70m of diluted mixture, 34m before meals and at bedtime, up to QID 200 mL 3   lidocaine-prilocaine (EMLA) cream Apply 1 application topically as needed (Utilize prior to port-a-cath needle access). 30 g 3   losartan (COZAAR) 25 MG tablet TAKE 1 TABLET(25 MG) BY MOUTH TWICE DAILY (Patient taking differently: Take 25 mg by mouth 2 (two) times daily.) 180 tablet 3   Nutritional Supplements (KATE FARMS STANDARD 1.4) LIQD 4 Bottles by Percutaneous Endoscopic Gastrostomy route daily. 1300 mL 6   Omega-3 1000  MG CAPS Take 1,000 mg by mouth 2 (two) times daily. (Patient not taking: No sig reported)     No current facility-administered medications for this encounter.    Physical Findings: The patient is in no acute distress. Patient is alert and oriented. Wt Readings from Last 3 Encounters:  01/11/21 182 lb 12.8 oz (82.9 kg)  01/06/21 185 lb 12.8 oz (84.3 kg)  12/24/20 189 lb 4.8 oz (85.9 kg)    height is '5\' 9"'$  (1.753 m) and weight is 182 lb 12.8 oz (82.9 kg). His temperature is 97.6 F (36.4 C). His blood pressure is 137/81 and his pulse is 77. His respiration is 20 and oxygen saturation is 100%. .  General: Alert and oriented, in no acute distress HEENT: Head is normocephalic. Extraocular movements are intact. Oropharynx is notable for healing mucosa, no thrush Neck: Neck is notable for healing skin, no masses palpated Skin: Skin in treatment fields shows satisfactory healing  Lymphatics: see Neck Exam Psychiatric: Judgment and insight are intact. Affect is appropriate.   Lab Findings: Lab Results  Component Value Date   WBC 4.4 01/06/2021   HGB 8.5 (L) 01/06/2021   HCT 25.6 (L) 01/06/2021   MCV 102.8 (H) 01/06/2021   PLT 77 (L) 01/06/2021    Lab Results  Component Value Date   TSH 1.54 04/09/2020    Radiographic Findings: No results found.  Impression/Plan:    1) Head and Neck Cancer Status: healing well from ChRT  2) Nutritional Status: Stabilizing PEG tube: Using for most of his nutrition  3)   Swallowing: Functional but painful, continue speech-language pathology exercises and continue soft foods like yogurt and pudding  4) Dental: Encouraged to continue regular followup with dentistry, and dental hygiene including fluoride rinses.  JeAnderson MaltaRN will navigate him back to dentistry for posttreatment follow-up  5) Thyroid function: Check annually Lab Results  Component Value Date   TSH 1.54 04/09/2020    6)  Apply moisturizers to neck twice a day for 2 more  months to heal skin  7) follow-up with restaging PET in second half of October. The patient was encouraged to call with any issues or questions before then.  On date of service, in total, I spent 20 minutes on this encounter. Patient was seen in person. _____________________________________   SaEppie GibsonMD

## 2021-01-13 ENCOUNTER — Encounter (HOSPITAL_BASED_OUTPATIENT_CLINIC_OR_DEPARTMENT_OTHER): Payer: Self-pay

## 2021-01-13 DIAGNOSIS — I10 Essential (primary) hypertension: Secondary | ICD-10-CM

## 2021-01-14 ENCOUNTER — Other Ambulatory Visit (HOSPITAL_BASED_OUTPATIENT_CLINIC_OR_DEPARTMENT_OTHER): Payer: Self-pay | Admitting: Cardiology

## 2021-01-14 DIAGNOSIS — I10 Essential (primary) hypertension: Secondary | ICD-10-CM

## 2021-01-14 MED ORDER — AMLODIPINE BESYLATE 5 MG PO TABS: 5.0000 mg | ORAL_TABLET | Freq: Every day | ORAL | 0 refills | Status: DC

## 2021-01-20 ENCOUNTER — Ambulatory Visit: Payer: Medicare Other | Attending: Radiation Oncology | Admitting: Physical Therapy

## 2021-01-20 ENCOUNTER — Encounter: Payer: Self-pay | Admitting: Physical Therapy

## 2021-01-20 ENCOUNTER — Other Ambulatory Visit: Payer: Self-pay

## 2021-01-20 DIAGNOSIS — C109 Malignant neoplasm of oropharynx, unspecified: Secondary | ICD-10-CM | POA: Diagnosis not present

## 2021-01-20 DIAGNOSIS — R293 Abnormal posture: Secondary | ICD-10-CM

## 2021-01-20 NOTE — Therapy (Signed)
[place], [place], [address] Phone: [phone]   Fax:  [phone]  Physical Therapy Treatment  Patient Details  Name: Jeffrey [name] MRN: [id number] Date of Birth: November 18, 1940 Referring Provider (PT): Reita May Date: 01/20/2021   PT End of Session - 01/20/21 1339     Visit Number 2    Number of Visits 2    Date for PT Re-Evaluation 01/28/21    PT Start Time 1304    PT Stop Time 1339    PT Time Calculation (min) 35 min    Activity Tolerance Patient tolerated treatment well    Behavior During Therapy [facility] for tasks assessed/performed             Past Medical History:  Diagnosis Date   AAA (abdominal aortic aneurysm) ([place])    Allergy    Arthritis    Colon polyps    Coronary artery calcification    Eczematous dermatitis    GERD (gastroesophageal reflux disease)    Hypertension    Iliac artery stenosis, left (HCC)    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   Right Buccal Cheek([facility])   Osteopenia     Past Surgical History:  Procedure Laterality Date   ABDOMINAL AORTIC ENDOVASCULAR FENESTRATED STENT GRAFT N/A 03/21/2018   Procedure: ABDOMINAL AORTIC ENDOVASCULAR FENESTRATED STENT GRAFT ; [place] CT PERFORMED;  Surgeon: [name], MD;  Location: [facility];  Service: Vascular;  Laterality: N/A;   Basal Skin Cancer  2012   CATARACT EXTRACTION Bilateral    IR GASTROSTOMY TUBE MOD SED  11/06/2020   IR IMAGING GUIDED PORT INSERTION  11/06/2020   MASS BIOPSY Right 09/25/2020   Procedure: OPEN NECK MASS BIOPSY;  Surgeon: Leta Baptist, MD;  Location: [address];  Service: ENT;  Laterality: Right;   TONSILLECTOMY     TRANSURETHRAL RESECTION OF PROSTATE      There were no vitals filed for this visit.   Subjective Assessment - 01/20/21 1307     Subjective I am having unrelenting pain in my throat and jaw. I am also having unrelenting pain in my back which is something that I  have had for a while.    Pertinent History Squamous cell carcinoma of oropharynx, Stage 1, 09/11/20 CT neck showed an enlarged right level 2 node. There was also noted to be asymmetric soft tissue at the right tongue base that was favored to reflect asymmetric tonsillar tissue, 10/05/20 excision of the right neck mass revealed  poorly differentiated squamous cell carcinoma. Tumor is p16 +  The nodule is mostly squamous cell carcinoma with a small  amount of lymphoid tissue at the periphery, presumably residual lymph node.  As the tumor has mostly replaced the presumed lymph node, there is almost certainly extra-nodal extension, the extent of which cannot be accurately measured due to the marked paucity of possible residual lymph node, 10/05/20 PET revealed primary mucosal neoplasm in the right tongue base area demonstrating hypermetabolism. The patient was status post excisional biopsy of the enlarged right level 2 lymph node and there were no other enlarged or hypermetabolic neck nodes. There were no PET-CT findings for metastatic disease involving the chest, abdomen, pelvis, or osseous structures, will receive 35 fractions of radiation to his Oropharynx and bilateral neck with weekly cisplatin. He started on 5/23 and will complete 12/29/20.    Patient Stated Goals to gain info from provider    Currently in Pain? Yes  Pain Score 6     Pain Location Throat    Pain Orientation Anterior    Pain Descriptors / Indicators Burning;Constant    Pain Type Acute pain    Pain Onset 1 to 4 weeks ago    Pain Frequency Constant    Aggravating Factors  eating    Pain Relieving Factors sipping cool water    Effect of Pain on Daily Activities hard to eat solid food    Multiple Pain Sites Yes    Pain Score 4    Pain Location Jaw    Pain Orientation Right    Pain Descriptors / Indicators Aching    Pain Type Acute pain    Pain Onset 1 to 4 weeks ago    Pain Frequency Constant    Aggravating Factors  eating solid  foods    Pain Relieving Factors ice    Effect of Pain on Daily Activities hard to eat    Pain Score 6    Pain Location Back    Pain Orientation Upper;Mid;Lower    Pain Descriptors / Indicators Constant;Sharp    Pain Type Chronic pain    Pain Onset More than a month ago    Pain Frequency Constant    Aggravating Factors  starting to walk, standing up    Pain Relieving Factors being asleep    Effect of Pain on Daily Activities impairs mobility                [facility] PT Assessment - 01/20/21 0001       Sit to Stand   Comments 30 sec sit to stand: 6 reps avg is 11 for his age      AROM   Overall AROM Comments assessed back pain with the following movements: flexion, extension, side bending all with no substantial increase or decrease in back pain    Cervical Flexion WFL    Cervical Extension WFL    Cervical - Right Side Bend WFL    Cervical - Left Side Bend WFL    Cervical - Right Rotation WFL    Cervical - Left Rotation WFL               LYMPHEDEMA/ONCOLOGY QUESTIONNAIRE - 01/20/21 0001       Head and Neck   4 cm superior to sternal notch around neck 42 cm    6 cm superior to sternal notch around neck 43 cm    8 cm superior to sternal notch around neck 43.7 cm                                     PT Long Term Goals - 01/20/21 1345       PT LONG TERM GOAL #1   Title Pt will demonstrate baseline cervical ROM and not demonstrate any signs or symptoms of lymphedema.    Time 8    Period Weeks    Status Achieved                   Plan - 01/20/21 1340     Clinical Impression Statement Pt returns to PT after completion of chemo and radiation for treatment of base of tongue and oropharynx cancer. He continues to demonstrate full cervical ROM and reports he has been compliant with his exercises. Pt has a new onset of lower back pain that he reports began when he woke up one  morning. He has a history of chronic mid and upper back  pain and was referred to a back pain clinic but did not want to have injections. He was considering surgery prior to his cancer diagnosis. His low back pain is not worsened by forward flexion or extension and he only had a twinge with bilateral lateral flexion. Pt also has some very minor swelling in area of right jaw that may be from radiation and not the beginning of lymphedema. He has no swelling in his neck and he reports his jaw swelling comes and goes and feels better with ice. Pt did not want to begin PT for back pain and fatigue. He would like to be discharged and see how he does over the next month or so. Educated pt to call his doctor to get another PT referral if his swelling worsens, if he begins to have neck swelling, continues to have low back pain or fatigue. Pt will be discharged from skilled PT services at this time.    PT Frequency --   eval and 1 f/u   PT Duration 8 weeks    PT Treatment/Interventions ADLs/Self Care Home Management;Therapeutic exercise;Patient/family education    PT Next Visit Plan d/c this visit    PT Home Exercise Plan head and neck ROM exercises    Consulted and Agree with Plan of Care Patient             Patient will benefit from skilled therapeutic intervention in order to improve the following deficits and impairments:  Pain, Postural dysfunction, Decreased knowledge of precautions  Visit Diagnosis: Abnormal posture  Oropharyngeal carcinoma ([place])     Problem List Patient Active Problem List   Diagnosis Date Noted   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[place]) 12/10/2020   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   Mucositis due to antineoplastic therapy 11/26/2020   Drug induced constipation 11/26/2020   Hypomagnesemia 11/26/2020   Normocytic normochromic anemia 11/19/2020   Port-A-Cath in place 11/09/2020   History of drug allergy 11/09/2020   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[place]) 10/21/2020   Malignant neoplasm of base of tongue ([place])  10/21/2020   Hyperglycemia 04/09/2020   Chronic neck pain 04/09/2020   Coronary artery disease due to calcified coronary lesion 12/27/2018   Osteopenia 02/24/2017   Abdominal aortic aneurysm (AAA) without rupture ([place]) 07/06/2015   Basal cell carcinoma 07/06/2015   Essential hypertension 07/06/2015   History of colon polyps 07/06/2015   HLD (hyperlipidemia) 07/06/2015    Allyson Sabal [facility] 01/20/2021, 1:46 PM  [place], [place], [address] Phone: [phone]   Fax:  [phone]  Name: Jeffrey [name] MRN: [id number] Date of Birth: [birth year]  PHYSICAL THERAPY DISCHARGE SUMMARY  Visits from Start of Care: 2  Current functional level related to goals / functional outcomes: All goals met   Remaining deficits: Still with low back pain but pt would like to see if this gets better on his own   Education / Equipment: HEP, lymphedema education   Patient agrees to discharge. Patient goals were met. Patient is being discharged due to meeting the stated rehab goals.  Allyson Sabal [place], [place] 01/20/21 1:47 PM

## 2021-01-25 DIAGNOSIS — R1310 Dysphagia, unspecified: Secondary | ICD-10-CM | POA: Diagnosis not present

## 2021-01-25 DIAGNOSIS — C109 Malignant neoplasm of oropharynx, unspecified: Secondary | ICD-10-CM | POA: Diagnosis not present

## 2021-01-25 DIAGNOSIS — Z931 Gastrostomy status: Secondary | ICD-10-CM | POA: Diagnosis not present

## 2021-01-26 ENCOUNTER — Ambulatory Visit: Payer: Medicare Other | Admitting: Nutrition

## 2021-01-26 NOTE — Progress Notes (Signed)
Nutrition follow-up completed with patient over the telephone. Patient completed treatment for oropharyngeal cancer.  Patient reports he continues to have dry mouth and thick saliva.  He thinks his throat is better but he has not been able to tolerate regular food yet.  He tried yogurt but it tasted horrible.  He is drinking water by mouth.  He has not tried to drink any oral nutrition supplements recently.  Weight decreased and documented as 182 pounds on July 25 decreased from 189.3 pounds July 7. Patient is using between 3 and 4 cartons of [employer] 1.4 via feeding tube daily.  This is less than he was infusing 1 month ago which is contributing to weight loss.  Estimated nutrition needs: 2000-2300 cal, 110-125 g protein, 2.3 L fluid.  Nutrition diagnosis:  Food and nutrition related knowledge deficit continues. Unintended weight loss related to inadequate calories and protein as evidenced by 4% weight loss over 1 month.  Intervention: Patient educated to increase tube feeding to 4-5 cartons [employer] 1.4 daily.  This would provide 1820-2275 cal, 80-100 g protein. Educated to try oral nutrition supplements by mouth. Educated on thinning down both drinks and foods such as yogurt or soup or ice cream to easier consistency to swallow. Educated on importance of adequate calories and protein to support healing and weight maintenance.  Monitoring, evaluation, goals: Patient will tolerate adequate calories and protein to minimize further weight loss and support healing.  Next visit: Approximately 1 month by telephone.  **Disclaimer: This note was dictated with voice recognition software. Similar sounding words can inadvertently be transcribed and this note may contain transcription errors which may not have been corrected upon publication of note.**

## 2021-02-02 ENCOUNTER — Inpatient Hospital Stay: Payer: Medicare Other | Admitting: Hematology and Oncology

## 2021-02-02 ENCOUNTER — Inpatient Hospital Stay: Payer: Medicare Other | Attending: Hematology and Oncology

## 2021-02-02 ENCOUNTER — Encounter: Payer: Self-pay | Admitting: Hematology and Oncology

## 2021-02-02 ENCOUNTER — Other Ambulatory Visit: Payer: Self-pay

## 2021-02-02 VITALS — BP 136/61 | HR 72 | Temp 97.8°F | Resp 17 | Ht 69.0 in | Wt 183.1 lb

## 2021-02-02 DIAGNOSIS — C109 Malignant neoplasm of oropharynx, unspecified: Secondary | ICD-10-CM

## 2021-02-02 DIAGNOSIS — Z87891 Personal history of nicotine dependence: Secondary | ICD-10-CM | POA: Diagnosis not present

## 2021-02-02 DIAGNOSIS — D6481 Anemia due to antineoplastic chemotherapy: Secondary | ICD-10-CM | POA: Insufficient documentation

## 2021-02-02 DIAGNOSIS — T451X5D Adverse effect of antineoplastic and immunosuppressive drugs, subsequent encounter: Secondary | ICD-10-CM | POA: Diagnosis not present

## 2021-02-02 DIAGNOSIS — Z931 Gastrostomy status: Secondary | ICD-10-CM | POA: Diagnosis not present

## 2021-02-02 DIAGNOSIS — D6959 Other secondary thrombocytopenia: Secondary | ICD-10-CM | POA: Insufficient documentation

## 2021-02-02 DIAGNOSIS — D61818 Other pancytopenia: Secondary | ICD-10-CM | POA: Diagnosis not present

## 2021-02-02 DIAGNOSIS — K1231 Oral mucositis (ulcerative) due to antineoplastic therapy: Secondary | ICD-10-CM | POA: Diagnosis not present

## 2021-02-02 DIAGNOSIS — R634 Abnormal weight loss: Secondary | ICD-10-CM | POA: Diagnosis not present

## 2021-02-02 DIAGNOSIS — Z800 Family history of malignant neoplasm of digestive organs: Secondary | ICD-10-CM | POA: Diagnosis not present

## 2021-02-02 LAB — CBC WITH DIFFERENTIAL (CANCER CENTER ONLY)
Abs Immature Granulocytes: 0.01 10*3/uL (ref 0.00–0.07)
Basophils Absolute: 0 10*3/uL (ref 0.0–0.1)
Basophils Relative: 0 %
Eosinophils Absolute: 0 10*3/uL (ref 0.0–0.5)
Eosinophils Relative: 0 %
HCT: 26 % — ABNORMAL LOW (ref 39.0–52.0)
Hemoglobin: 8.7 g/dL — ABNORMAL LOW (ref 13.0–17.0)
Immature Granulocytes: 0 %
Lymphocytes Relative: 14 %
Lymphs Abs: 0.5 10*3/uL — ABNORMAL LOW (ref 0.7–4.0)
MCH: 36 pg — ABNORMAL HIGH (ref 26.0–34.0)
MCHC: 33.5 g/dL (ref 30.0–36.0)
MCV: 107.4 fL — ABNORMAL HIGH (ref 80.0–100.0)
Monocytes Absolute: 0.8 10*3/uL (ref 0.1–1.0)
Monocytes Relative: 25 %
Neutro Abs: 2 10*3/uL (ref 1.7–7.7)
Neutrophils Relative %: 61 %
Platelet Count: 57 10*3/uL — ABNORMAL LOW (ref 150–400)
RBC: 2.42 MIL/uL — ABNORMAL LOW (ref 4.22–5.81)
RDW: 17.9 % — ABNORMAL HIGH (ref 11.5–15.5)
WBC Count: 3.3 10*3/uL — ABNORMAL LOW (ref 4.0–10.5)
nRBC: 0 % (ref 0.0–0.2)

## 2021-02-02 LAB — BASIC METABOLIC PANEL - CANCER CENTER ONLY
Anion gap: 7 (ref 5–15)
BUN: 26 mg/dL — ABNORMAL HIGH (ref 8–23)
CO2: 25 mmol/L (ref 22–32)
Calcium: 9.2 mg/dL (ref 8.9–10.3)
Chloride: 100 mmol/L (ref 98–111)
Creatinine: 1.11 mg/dL (ref 0.61–1.24)
GFR, Estimated: >60 mL/min (ref >60)
Glucose, Bld: 104 mg/dL — ABNORMAL HIGH (ref 70–99)
Potassium: 4.8 mmol/L (ref 3.5–5.1)
Sodium: 132 mmol/L — ABNORMAL LOW (ref 135–145)

## 2021-02-02 LAB — MAGNESIUM: Magnesium: 1.8 mg/dL (ref 1.7–2.4)

## 2021-02-02 NOTE — Progress Notes (Signed)
[place]   [phone]) [phone] [phone]   Clinic Follow up Note   Patient Care Team: Vivi Barrack, MD as PCP - General (Family Medicine) Buford Dresser, MD as PCP - Cardiology (Cardiology) Chevis Pretty as Consulting Physician (Dentistry) [name], MD as Consulting Physician (Vascular Surgery) Warren Danes, PA-C as Physician Assistant (Dermatology) [place], Stephani Police, RN as Oncology Nurse Navigator Eppie Gibson, MD as Consulting Physician (Radiation Oncology) Leta Baptist, MD as Consulting Physician (Otolaryngology) Benay Pike, MD as Consulting Physician (Hematology and Oncology)  Date of Service:  02/02/2021  CHIEF COMPLAINT: f/u of oropharyngeal cancer after completion of chemoradiation.  SUMMARY OF ONCOLOGIC HISTORY: Oncology History Overview Note  Patient first noticed to have neck mass about 2 months prior to presentation. He denies any complaints except for neck mass, no difficulty swallowing, pain at the base of the tongue. He went to see his PCP who recommended an [place] which demonstrated hypoechoic mass at the right level II a highly concerning for an enlarged pathologic LN. He was referred to ENT and CT neck was ordered. CT neck showed enlarged right level 2 node, asymmetric soft tissue at the right tongue base, favored to reflect asymmetric tonsillar issue. 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 PET CT showed primary mucosal neoplasm in the right tongue base area demonstrating hypermetabolism.    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HCC)  10/21/2020 Initial Diagnosis   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[facility])   10/21/2020 Cancer Staging   Staging form: Pharynx - HPV-Mediated Oropharynx, AJCC 8th Edition - Clinical stage from 10/21/2020: Stage I (cT1, cN1, cM0, p16+) - Signed by Benay Pike, MD on 10/21/2020 Stage prefix:  Initial diagnosis   11/10/2020 -  Chemotherapy    Patient is on Treatment Plan: HEAD/NECK CISPLATIN Q7D         CURRENT THERAPY:   Cisplatin q7d, received 100 mg/m2 total, multiple delays due to cytopenias and poor tolerance. Concurrent radiation therapy, through 12/29/20  INTERVAL HISTORY:   Jeffrey [name] is here for a follow up of oropharyngeal cancer. He completed radiation on 12/29/2020.  He had 4 cycles of chemo, dose reduced, received a total dose of 100 mg per metered square. Since last visit, he is slowly recovering.  He is now able to eat some food by mouth.  He is still using his G-tube for most of his feeding, using 3 cans a day.  He is trying to get it close to 2000 cal but not quite there yet.  Throat pain is not bothersome anymore.  He still feels quite tired, have sent puffs with mild activity.  No hearing loss, neuropathy or other residual adverse effects from cisplatin. Baseline constipation.  No change in urinary habits. Rest of the pertinent 10 point ROS reviewed and negative.  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   Right Buccal Cheek([facility])   Osteopenia     SURGICAL HISTORY: Past Surgical History:  Procedure Laterality Date   ABDOMINAL AORTIC ENDOVASCULAR FENESTRATED STENT GRAFT N/A 03/21/2018   Procedure: ABDOMINAL AORTIC ENDOVASCULAR FENESTRATED  STENT GRAFT ; [place] CT PERFORMED;  Surgeon: [name], MD;  Location: [facility];  Service: Vascular;  Laterality: N/A;   Basal Skin Cancer  2012   CATARACT EXTRACTION Bilateral    IR GASTROSTOMY TUBE MOD SED  11/06/2020   IR IMAGING GUIDED PORT INSERTION  11/06/2020   MASS BIOPSY Right 09/25/2020   Procedure: OPEN  NECK MASS BIOPSY;  Surgeon: Leta Baptist, MD;  Location: [facility];  Service: ENT;  Laterality: Right;   TONSILLECTOMY     TRANSURETHRAL RESECTION OF PROSTATE      ALLERGIES:  is allergic to ketotifen fumarate, mixed ragweed, and triamcinolone.  MEDICATIONS:  Current Outpatient Medications  Medication Sig Dispense Refill   amLODipine (NORVASC) 5 MG tablet Take 1 tablet (5 mg total) by mouth daily. 30 tablet 0   ARTIFICIAL TEAR SOLUTION OP Place 1 drop into both eyes daily as needed (dry eyes).     Ascorbic Acid (VITAMIN C) 1000 MG tablet Take 1,000 mg by mouth daily.     aspirin EC 81 MG tablet Take 81 mg by mouth daily. Swallow whole.     atorvastatin (LIPITOR) 40 MG tablet TAKE 1 TABLET(40 MG) BY MOUTH DAILY AT 6 PM (Patient taking differently: Take 40 mg by mouth daily at 6 PM.) 90 tablet 1   Cyanocobalamin 1000 MCG TBCR Take 1,000 mcg by mouth daily.     Emollient (CERAVE) LOTN Apply 1 application topically daily as needed (after shower care).      ferrous sulfate 325 (65 FE) MG tablet Take 325 mg by mouth every other day.     fluticasone (FLONASE) 50 MCG/ACT nasal spray Place 1 spray into both nostrils every other day.     GLUCOSAMINE-CHONDROITIN PO Take 1 tablet by mouth 2 (two) times daily.      HYDROcodone-acetaminophen (HYCET) 7.5-325 mg/15 ml solution Take 10 mLs by mouth every 12 (twelve) hours as needed for moderate pain. 120 mL 0   lidocaine (XYLOCAINE) 2 % solution Patient: Mix 1part 2% viscous lidocaine, 1part H20. Swish & swallow 19m of diluted mixture, 365m before meals and at bedtime, up to QID 200 mL 3   lidocaine-prilocaine (EMLA) cream Apply 1 application topically as needed (Utilize prior to port-a-cath needle access). 30 g 3   losartan (COZAAR) 25 MG tablet TAKE 1 TABLET(25 MG) BY MOUTH TWICE DAILY (Patient taking differently: Take 25 mg by mouth 2 (two) times daily.) 180 tablet 3   Nutritional Supplements (KATE FARMS STANDARD 1.4) LIQD 4 Bottles by  Percutaneous Endoscopic Gastrostomy route daily. 1300 mL 6   Omega-3 1000 MG CAPS Take 1,000 mg by mouth 2 (two) times daily.     No current facility-administered medications for this visit.    PHYSICAL EXAMINATION: ECOG PERFORMANCE STATUS: 2 - Symptomatic, <50% confined to bed  Vitals:   02/02/21 1218  BP: 136/61  Pulse: 72  Resp: 17  Temp: 97.8 F (36.6 C)  SpO2: 100%    Filed Weights   02/02/21 1218  Weight: 183 lb 1.6 oz (83.1 kg)   GENERAL:alert, appears tired. SKIN: skin color, texture, turgor are normal, no rashes or significant lesions EYES: normal, Conjunctiva are pink and non-injected, sclera clear OROPHARYNX: Mild erythema oral cavity, no ulceration.   NECK: erythema from radiation, improving Musculoskeletal:no cyanosis of digits and no clubbing  NEURO: alert & oriented x 3 with fluent speech, no focal motor/sensory deficits  LABORATORY DATA:  I have reviewed the data as listed CBC Latest Ref Rng &  Units 02/02/2021 01/06/2021 12/24/2020  WBC 4.0 - 10.5 K/uL 3.3(L) 4.4 5.1  Hemoglobin 13.0 - 17.0 g/dL 8.7(L) 8.5(L) 8.0(L)  Hematocrit 39.0 - 52.0 % 26.0(L) 25.6(L) 22.9(L)  Platelets 150 - 400 K/uL 57(L) 77(L) 111(L)     CMP Latest Ref Rng & Units 02/02/2021 01/06/2021 12/24/2020  Glucose 70 - 99 mg/dL 104(H) 110(H) 95  BUN 8 - 23 mg/dL 26(H) 28(H) 27(H)  Creatinine 0.61 - 1.24 mg/dL 1.11 1.02 0.99  Sodium 135 - 145 mmol/L 132(L) 133(L) 134(L)  Potassium 3.5 - 5.1 mmol/L 4.8 4.5 4.6  Chloride 98 - 111 mmol/L 100 94(L) 101  CO2 22 - 32 mmol/L '25 28 25  '$ Calcium 8.9 - 10.3 mg/dL 9.2 9.4 8.9  Total Protein 6.5 - 8.1 g/dL - - -  Total Bilirubin 0.3 - 1.2 mg/dL - - -  Alkaline Phos 38 - 126 U/L - - -  AST 15 - 41 U/L - - -  ALT 0 - 44 U/L - - -     RADIOGRAPHIC STUDIES: I have personally reviewed the radiological images as listed and agreed with the findings in the report. No results found.   ASSESSMENT & PLAN:  Quill Rajaram is a 80 y.o. male with    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[facility]), TxN1M0, HPV(+) CT neck showed enlarged right level 2 node, asymmetric soft tissue at the right tongue base, favored to reflect asymmetric tonsillar issue. -Initial biopsy was inconclusive so he had an excisional biopsy which showed P16 positive right neck poorly differentiated SCC. -PET CT showed primary mucosal neoplasm in the right tongue base area demonstrating hypermetabolism. -He was started on concurrent chemoradiation therapy with cisplatin on 11/09/20. -He developed worsening thrombocytopenia, anemia and leukopenia following cycle 2. Chemo has been held since 11/19/20. -He overall got 100 max per metered square of cisplatin, multiple delays due to cytopenias in the last 2 cycles, he only got 20 mg per metered square.   He is still recovering from recent treatment.  Fatigue is still bothersome.  Mucositis has almost resolved.  He is able to swallow food by mouth but still continues with G-tube feeding for most of his nourishment.  His labs today show persistent cytopenias.  No excessive bleeding or bruising. I have recommended that he follow-up with repeat labs in about 4 weeks.  He was encouraged to contact us sooner for any further questions or concerns. Anticipate end of treatment PET/CT in October to look at the status of the disease.  2. Mucositis due to antineoplastic therapy Pain is almost negligible.  He has not been using any pain medication at this time.  He is trying to swallow some soft foods.  We will continue to monitor this.   3. Chemotherapy-induced cytopenia No immediate transfusion needs.  He will return to clinic for follow-up in 4 weeks with repeat labs.   Plan: -Completed chemotherapy and radiation, not very well-tolerated. Return to clinic in 4 weeks with repeat labs.     Benay Pike, MD 02/02/2021

## 2021-02-04 ENCOUNTER — Inpatient Hospital Stay: Payer: Medicare Other | Admitting: Hematology and Oncology

## 2021-02-04 ENCOUNTER — Inpatient Hospital Stay: Payer: Medicare Other

## 2021-02-12 ENCOUNTER — Other Ambulatory Visit (HOSPITAL_BASED_OUTPATIENT_CLINIC_OR_DEPARTMENT_OTHER): Payer: Self-pay | Admitting: Cardiology

## 2021-02-12 DIAGNOSIS — I10 Essential (primary) hypertension: Secondary | ICD-10-CM

## 2021-02-12 NOTE — Telephone Encounter (Signed)
Rx request sent to pharmacy.  

## 2021-02-15 NOTE — Progress Notes (Signed)
                                                                                                                                                             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 C32.8-Malignant neoplasm of overlapping sites of larynx  Cancer Staging: Cancer Staging 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[place]) Staging form: Pharynx - HPV-Mediated Oropharynx, AJCC 8th Edition - Clinical stage from 10/21/2020: Stage I (cT1, cN1, cM0, p16+) - Signed by Benay Pike, MD on 10/21/2020 Stage prefix: Initial diagnosis  Intent: Curative  Radiation Treatment Dates: 11/09/2020 through 12/29/2020 Site Technique Total Dose (Gy) Dose per Fx (Gy) Completed Fx Beam Energies  Oropharynx: HN_orophar IMRT 70/70 2 35/35 6X   Narrative: The patient tolerated radiation therapy relatively well.   Plan: The patient will follow-up with radiation oncology in 2-3 wks.  -----------------------------------  Eppie Gibson, MD

## 2021-02-16 ENCOUNTER — Ambulatory Visit: Payer: Medicare Other

## 2021-02-17 ENCOUNTER — Encounter: Payer: Self-pay | Admitting: Hematology and Oncology

## 2021-02-17 ENCOUNTER — Other Ambulatory Visit: Payer: Self-pay

## 2021-02-17 ENCOUNTER — Ambulatory Visit: Payer: Medicare Other | Attending: Radiation Oncology

## 2021-02-17 DIAGNOSIS — R1310 Dysphagia, unspecified: Secondary | ICD-10-CM | POA: Diagnosis not present

## 2021-02-17 NOTE — Patient Instructions (Signed)
   Signs of Aspiration Pneumonia   Chest pain/tightness Fever (can be low grade) Cough  With foul-smelling phlegm (sputum) With sputum containing pus or blood With greenish sputum Fatigue  Shortness of breath  Wheezing   **IF YOU HAVE THESE SIGNS, CONTACT YOUR DOCTOR OR GO TO THE EMERGENCY DEPARTMENT OR URGENT CARE AS SOON AS POSSIBLE**     

## 2021-02-17 NOTE — Therapy (Signed)
[facility] [address] [place], [place], [address] Phone: [phone]   Fax:  [phone]  Speech Language Pathology Treatment  Patient Details  Name: Jeffrey [name] MRN: [id number] Date of Birth: 02/05/1941 Referring Provider (SLP): Eppie Gibson, MD   Encounter Date: 02/17/2021   End of Session - 02/17/21 1409     Visit Number 3    Number of Visits 4    Date for SLP Re-Evaluation 03/08/21    SLP Start Time 24    SLP Stop Time  57    SLP Time Calculation (min) 29 min    Activity Tolerance Patient tolerated treatment well             Past Medical History:  Diagnosis Date   AAA (abdominal aortic aneurysm) ([facility])    Allergy    Arthritis    Colon polyps    Coronary artery calcification    Eczematous dermatitis    GERD (gastroesophageal reflux disease)    Hypertension    Iliac artery stenosis, left (HCC)    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   Right Buccal Cheek([facility])   Osteopenia     Past Surgical History:  Procedure Laterality Date   ABDOMINAL AORTIC ENDOVASCULAR FENESTRATED STENT GRAFT N/A 03/21/2018   Procedure: ABDOMINAL AORTIC ENDOVASCULAR FENESTRATED STENT GRAFT ; [place] CT PERFORMED;  Surgeon: [name], MD;  Location: [facility];  Service: Vascular;  Laterality: N/A;   Basal Skin Cancer  2012   CATARACT EXTRACTION Bilateral    IR GASTROSTOMY TUBE MOD SED  11/06/2020   IR IMAGING GUIDED PORT INSERTION  11/06/2020   MASS BIOPSY Right 09/25/2020   Procedure: OPEN NECK MASS BIOPSY;  Surgeon: Leta Baptist, MD;  Location: [facility];  Service: ENT;  Laterality: Right;   TONSILLECTOMY     TRANSURETHRAL RESECTION OF PROSTATE      There were no vitals filed for this visit.   Subjective Assessment - 02/17/21 1324     Subjective "I still have some issues but I'm in a lot better place than last time."    Currently in Pain? Yes    Pain Location Back    Pain Orientation  Right;Left    Pain Descriptors / Indicators Constant    Pain Type Chronic pain    Pain Onset More than a month ago    Pain Frequency Constant                   ADULT SLP TREATMENT - 02/17/21 1330       General Information   Behavior/Cognition Alert;Cooperative;Pleasant mood      Treatment Provided   Treatment provided Dysphagia      Dysphagia Treatment   Temperature Spikes Noted No    Respiratory Status Room air    Treatment Methods Skilled observation;Therapeutic exercise;Patient/caregiver education    Patient observed directly with PO's Yes    Type of PO's observed Regular;Thin liquids    Oral Phase Signs & Symptoms --   none noted   Pharyngeal Phase Signs & Symptoms --   none noted   Other treatment/comments Pt now eating multiple items. Today he had peanut butter crackers adn water without overt s/s oral or pharyngeal difficulties. HEP completed with occasional min A, stating he was not completing HEP as prescribed. SLP provided aspiration PNA s/sx and pt told SLP 3 of these with modified independence.      Assessment / Recommendations / Plan   Plan Continue with  current plan of care      Progression Toward Goals   Progression toward goals Progressing toward goals              SLP Education - 02/17/21 1417     Education Details aspiration PNA, HEP procedure    Person(s) Educated Patient    Methods Explanation;Demonstration;Verbal cues    Comprehension Verbalized understanding;Returned demonstration;Verbal cues required               ST SHORT TERM GOALS 02/17/21  SLP SHORT TERM GOAL #1      Title pt will tell SLP why pt is completing HEP with modified independence    Time      Period --   visits, for all STGs    Status Not met (SLP provided total A)           SLP SHORT TERM GOAL #2    Title pt will complete HEP with rare min A in 2 sessions    Time     Status Not met           SLP SHORT TERM GOAL #3    Title pt will describe 3 overt s/s  aspiration PNA with modified independence    Time     Status Met           SLP SHORT TERM GOAL #4    Title pt will tell SLP how a food journal could hasten return to a more normalized diet    Time     Status Deferred                    SLP Long Term Goals - 02/17/21 1637                     SLP LONG TERM GOAL #1    Title pt will complete HEP with modified independence over tree visits    Time 2    Period --   visits, for all LTGs    Status Ongoing           SLP LONG TERM GOAL #2    Title pt will complete HEP with independence over two visits    Time 3    Status Ongoing           SLP LONG TERM GOAL #3    Title pt will describe how to modify HEP over time, and the timeline associated with reduction in HEP frequency with modified independence over two sessions    Time 4    Status Ongoing         Plan - 02/17/21 1410     Clinical Impression Statement At this time Jeffrey [name]'s swallowing is deemed WNL/WFL with regular diet (peanut butter crackers) and thin liquids - pt is no longer using PEG tube for primary nutrition due to [place]. SLP reviewed his individualized HEP for dysphagia and pt completed each exercise on their own with occasional SLP cues faded to modified independent. There are no overt s/s aspiration reported by pt at this time. Data indicate that pt's swallow ability will likely decrease over the course of radiation therapy and could very well decline over time following conclusion of their radiation therapy due to muscle disuse atrophy and/or muscle fibrosis. Pt will cont to need to be seen by SLP in order to assess safety of PO intake, assess the need for recommending any objective swallow assessment, and ensuring pt correctly completes the individualized HEP.  Speech Therapy Frequency --   once approx every 4 weeks   Duration 12 weeks    Treatment/Interventions Aspiration precaution training;Pharyngeal strengthening exercises;Diet toleration management by  SLP;Trials of upgraded texture/liquids;Patient/family education;SLP instruction and feedback;Internal/external aids    Potential to Achieve Goals Good    SLP Home Exercise Plan provided today    Consulted and Agree with Plan of Care Patient             Patient will benefit from skilled therapeutic intervention in order to improve the following deficits and impairments:   Dysphagia, unspecified type    Problem List Patient Active Problem List   Diagnosis Date Noted   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[place]) 12/10/2020   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   Mucositis due to antineoplastic therapy 11/26/2020   Drug induced constipation 11/26/2020   Hypomagnesemia 11/26/2020   Normocytic normochromic anemia 11/19/2020   Port-A-Cath in place 11/09/2020   History of drug allergy 11/09/2020   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[facility]) 10/21/2020   Malignant neoplasm of base of tongue ([place]) 10/21/2020   Hyperglycemia 04/09/2020   Chronic neck pain 04/09/2020   Coronary artery disease due to calcified coronary lesion 12/27/2018   Osteopenia 02/24/2017   Abdominal aortic aneurysm (AAA) without rupture ([place]) 07/06/2015   Basal cell carcinoma 07/06/2015   Essential hypertension 07/06/2015   History of colon polyps 07/06/2015   HLD (hyperlipidemia) 07/06/2015    Yashas Camilli. ,[place], [place]  02/17/2021, 2:18 PM  [facility] [address] [place], [place], [address] Phone: [phone]   Fax:  [phone]   Name: Kaysin Brock MRN: [id number] Date of Birth: November 23, 1940

## 2021-02-23 ENCOUNTER — Encounter: Payer: Self-pay | Admitting: Hematology and Oncology

## 2021-02-23 ENCOUNTER — Other Ambulatory Visit (HOSPITAL_BASED_OUTPATIENT_CLINIC_OR_DEPARTMENT_OTHER): Payer: Self-pay

## 2021-03-01 ENCOUNTER — Encounter: Payer: Medicare Other | Admitting: Nutrition

## 2021-03-01 ENCOUNTER — Telehealth: Payer: Self-pay | Admitting: Nutrition

## 2021-03-01 NOTE — Telephone Encounter (Signed)
Nutrition follow-up completed with patient over the telephone status post treatment for oropharyngeal cancer.  Last weight documented was 183.1 pounds August 16 stable from 182 pounds July 25. Patient reports he is trying to eat.  His throat has not completely healed and it still causes him some pain.  He continues to have dry mouth with thick saliva.  Nevertheless he tries to eat something every day. He is using 3 cartons of [employer] 1.4 via feeding tube daily. He drinks one half a carton of Ensure complete mixed with whole milk daily. He tries to eat foods daily but still has taste alterations.  Nutrition diagnosis: Food and nutrition related knowledge deficit improved. Unintended weight loss cannot be evaluated.  Intervention: If patient is maintaining weight after MD visit tomorrow, continue same strategies for calories and protein via feeding tube and oral intake.  If patient has demonstrated weight loss, encouraged him to increase oral intake to minimize weight loss.  If patient not feeling well, use 4 cartons of [employer] 1.4 via feeding tube. As oral intake is able to increase he will be able to decrease [facility] via feeding tube. Provided support and encouragement.  Monitoring, evaluation, goals: Patient will tolerate adequate calories and protein to minimize weight loss and support healing.  Next visit: Wednesday, October 19 by telephone with Vinnie Level.  **Disclaimer: This note was dictated with voice recognition software. Similar sounding words can inadvertently be transcribed and this note may contain transcription errors which may not have been corrected upon publication of note.**

## 2021-03-02 ENCOUNTER — Other Ambulatory Visit: Payer: Self-pay

## 2021-03-02 ENCOUNTER — Encounter: Payer: Self-pay | Admitting: Hematology and Oncology

## 2021-03-02 ENCOUNTER — Inpatient Hospital Stay: Payer: Medicare Other | Attending: Hematology and Oncology | Admitting: Hematology and Oncology

## 2021-03-02 ENCOUNTER — Inpatient Hospital Stay: Payer: Medicare Other

## 2021-03-02 VITALS — BP 143/82 | HR 73 | Temp 97.4°F | Resp 17 | Wt 179.2 lb

## 2021-03-02 DIAGNOSIS — Z9221 Personal history of antineoplastic chemotherapy: Secondary | ICD-10-CM | POA: Diagnosis not present

## 2021-03-02 DIAGNOSIS — Z931 Gastrostomy status: Secondary | ICD-10-CM | POA: Insufficient documentation

## 2021-03-02 DIAGNOSIS — D61810 Antineoplastic chemotherapy induced pancytopenia: Secondary | ICD-10-CM | POA: Diagnosis not present

## 2021-03-02 DIAGNOSIS — R634 Abnormal weight loss: Secondary | ICD-10-CM

## 2021-03-02 DIAGNOSIS — Z800 Family history of malignant neoplasm of digestive organs: Secondary | ICD-10-CM | POA: Diagnosis not present

## 2021-03-02 DIAGNOSIS — T451X5D Adverse effect of antineoplastic and immunosuppressive drugs, subsequent encounter: Secondary | ICD-10-CM | POA: Diagnosis not present

## 2021-03-02 DIAGNOSIS — C109 Malignant neoplasm of oropharynx, unspecified: Secondary | ICD-10-CM | POA: Diagnosis not present

## 2021-03-02 DIAGNOSIS — Z87891 Personal history of nicotine dependence: Secondary | ICD-10-CM | POA: Diagnosis not present

## 2021-03-02 DIAGNOSIS — K1231 Oral mucositis (ulcerative) due to antineoplastic therapy: Secondary | ICD-10-CM | POA: Diagnosis not present

## 2021-03-02 DIAGNOSIS — Z923 Personal history of irradiation: Secondary | ICD-10-CM | POA: Diagnosis not present

## 2021-03-02 LAB — CBC WITH DIFFERENTIAL (CANCER CENTER ONLY)
Abs Immature Granulocytes: 0.04 10*3/uL (ref 0.00–0.07)
Basophils Absolute: 0 10*3/uL (ref 0.0–0.1)
Basophils Relative: 0 %
Eosinophils Absolute: 0 10*3/uL (ref 0.0–0.5)
Eosinophils Relative: 0 %
HCT: 28.3 % — ABNORMAL LOW (ref 39.0–52.0)
Hemoglobin: 9.7 g/dL — ABNORMAL LOW (ref 13.0–17.0)
Immature Granulocytes: 1 %
Lymphocytes Relative: 10 %
Lymphs Abs: 0.6 10*3/uL — ABNORMAL LOW (ref 0.7–4.0)
MCH: 35.3 pg — ABNORMAL HIGH (ref 26.0–34.0)
MCHC: 34.3 g/dL (ref 30.0–36.0)
MCV: 102.9 fL — ABNORMAL HIGH (ref 80.0–100.0)
Monocytes Absolute: 1 10*3/uL (ref 0.1–1.0)
Monocytes Relative: 20 %
Neutro Abs: 3.7 10*3/uL (ref 1.7–7.7)
Neutrophils Relative %: 69 %
Platelet Count: 106 10*3/uL — ABNORMAL LOW (ref 150–400)
RBC: 2.75 MIL/uL — ABNORMAL LOW (ref 4.22–5.81)
RDW: 14.2 % (ref 11.5–15.5)
WBC Count: 5.3 10*3/uL (ref 4.0–10.5)
nRBC: 0 % (ref 0.0–0.2)

## 2021-03-02 LAB — BASIC METABOLIC PANEL - CANCER CENTER ONLY
Anion gap: 8 (ref 5–15)
BUN: 25 mg/dL — ABNORMAL HIGH (ref 8–23)
CO2: 25 mmol/L (ref 22–32)
Calcium: 9.6 mg/dL (ref 8.9–10.3)
Chloride: 97 mmol/L — ABNORMAL LOW (ref 98–111)
Creatinine: 1.04 mg/dL (ref 0.61–1.24)
GFR, Estimated: >60 mL/min (ref >60)
Glucose, Bld: 99 mg/dL (ref 70–99)
Potassium: 4.8 mmol/L (ref 3.5–5.1)
Sodium: 130 mmol/L — ABNORMAL LOW (ref 135–145)

## 2021-03-02 LAB — MAGNESIUM: Magnesium: 1.7 mg/dL (ref 1.7–2.4)

## 2021-03-02 NOTE — Progress Notes (Signed)
[place]   [phone] [phone]   Clinic Follow up Note   Patient Care Team: Vivi Barrack, MD as PCP - General (Family Medicine) Buford Dresser, MD as PCP - Cardiology (Cardiology) Chevis Pretty as Consulting Physician (Dentistry) [name], MD as Consulting Physician (Vascular Surgery) Warren Danes, PA-C as Physician Assistant (Dermatology) Mal[place]elt, Stephani Police, RN as Oncology Nurse Navigator Eppie Gibson, MD as Consulting Physician (Radiation Oncology) Leta Baptist, MD as Consulting Physician (Otolaryngology) Benay Pike, MD as Consulting Physician (Hematology and Oncology)  Date of Service:  03/02/2021  CHIEF COMPLAINT: f/u of oropharyngeal cancer after completion of chemoradiation.  SUMMARY OF ONCOLOGIC HISTORY: Oncology History Overview Note  Patient first noticed to have neck mass about 2 months prior to presentation. He denies any complaints except for neck mass, no difficulty swallowing, pain at the base of the tongue. He went to see his PCP who recommended an [place] which demonstrated hypoechoic mass at the right level II a highly concerning for an enlarged pathologic LN. He was referred to ENT and CT neck was ordered. CT neck showed enlarged right level 2 node, asymmetric soft tissue at the right tongue base, favored to reflect asymmetric tonsillar issue. 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 PET CT showed primary mucosal neoplasm in the right tongue base area demonstrating hypermetabolism.    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HCC)  10/21/2020 Initial Diagnosis   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[place])   10/21/2020 Cancer Staging   Staging form: Pharynx - HPV-Mediated Oropharynx, AJCC 8th Edition - Clinical stage from 10/21/2020: Stage I (cT1, cN1, cM0, p16+) - Signed by Benay Pike, MD on 10/21/2020 Stage prefix:  Initial diagnosis   11/10/2020 -  Chemotherapy    Patient is on Treatment Plan: HEAD/NECK CISPLATIN Q7D         CURRENT THERAPY:  None, completed chemotherapy.   INTERVAL HISTORY:   Jeffrey [name] is here for a follow up of oropharyngeal cancer. He completed radiation on 12/29/2020.  He had 4 cycles of chemo, dose reduced, received a total dose of 100 mg per metered square. He couldn't tolerate any further chemotherapy [name] is here for follow-up after finishing chemoradiation.  Since his last visit, he thinks the recovery has been there but slow.  He still using G-tube for most of his feeding.  He has been able to eat some food but very small quantities of food.  He still cannot taste most of it and he feels like the throat hurts especially when he tries to swallow.  He lost about 3 pounds since his last visit.  Besides the above-mentioned complaints, he has noticed the thick saliva which has gotten better, dry mouth.  No ringing noise hearing loss or neuropathy. Rest of the pertinent 10 point ROS reviewed and negative. Cisplatin q7d, received 100 mg/m2 total, multiple delays due to cytopenias and poor tolerance. 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   Right Buccal Cheek([facility])   Osteopenia     SURGICAL HISTORY: Past  Surgical History:  Procedure Laterality Date   ABDOMINAL AORTIC ENDOVASCULAR FENESTRATED STENT GRAFT N/A 03/21/2018   Procedure: ABDOMINAL AORTIC ENDOVASCULAR FENESTRATED STENT GRAFT ; [place] CT PERFORMED;  Surgeon: [name], MD;  Location: [facility];  Service: Vascular;  Laterality: N/A;   Basal Skin Cancer  2012    CATARACT EXTRACTION Bilateral    IR GASTROSTOMY TUBE MOD SED  11/06/2020   IR IMAGING GUIDED PORT INSERTION  11/06/2020   MASS BIOPSY Right 09/25/2020   Procedure: OPEN NECK MASS BIOPSY;  Surgeon: Leta Baptist, MD;  Location: [facility];  Service: ENT;  Laterality: Right;   TONSILLECTOMY     TRANSURETHRAL RESECTION OF PROSTATE      ALLERGIES:  is allergic to ketotifen fumarate, mixed ragweed, and triamcinolone.  MEDICATIONS:  Current Outpatient Medications  Medication Sig Dispense Refill   amLODipine (NORVASC) 5 MG tablet Take 1 tablet (5 mg total) by mouth daily. Please keep your upcoming appointment for refills. 30 tablet 0   ARTIFICIAL TEAR SOLUTION OP Place 1 drop into both eyes daily as needed (dry eyes).     Ascorbic Acid (VITAMIN C) 1000 MG tablet Take 1,000 mg by mouth daily.     aspirin EC 81 MG tablet Take 81 mg by mouth daily. Swallow whole.     atorvastatin (LIPITOR) 40 MG tablet TAKE 1 TABLET(40 MG) BY MOUTH DAILY AT 6 PM (Patient taking differently: Take 40 mg by mouth daily at 6 PM.) 90 tablet 1   Cyanocobalamin 1000 MCG TBCR Take 1,000 mcg by mouth daily.     Emollient (CERAVE) LOTN Apply 1 application topically daily as needed (after shower care).      ferrous sulfate 325 (65 FE) MG tablet Take 325 mg by mouth every other day.     fluticasone (FLONASE) 50 MCG/ACT nasal spray Place 1 spray into both nostrils every other day.     GLUCOSAMINE-CHONDROITIN PO Take 1 tablet by mouth 2 (two) times daily.      HYDROcodone-acetaminophen (HYCET) 7.5-325 mg/15 ml solution Take 10 mLs by mouth every 12 (twelve) hours as needed for moderate pain. 120 mL 0   lidocaine (XYLOCAINE) 2 % solution Patient: Mix 1part 2% viscous lidocaine, 1part H20. Swish & swallow 50m of diluted mixture, 325m before meals and at bedtime, up to QID 200 mL 3   lidocaine-prilocaine (EMLA) cream Apply 1 application topically as needed (Utilize prior to port-a-cath needle access). 30 g 3   losartan  (COZAAR) 25 MG tablet TAKE 1 TABLET(25 MG) BY MOUTH TWICE DAILY (Patient taking differently: Take 25 mg by mouth 2 (two) times daily.) 180 tablet 3   Nutritional Supplements (KATE FARMS STANDARD 1.4) LIQD 4 Bottles by Percutaneous Endoscopic Gastrostomy route daily. 1300 mL 6   Omega-3 1000 MG CAPS Take 1,000 mg by mouth 2 (two) times daily.     No current facility-administered medications for this visit.    PHYSICAL EXAMINATION: ECOG PERFORMANCE STATUS: 2 - Symptomatic, <50% confined to bed  Vitals:   03/02/21 1223  BP: (!) 143/82  Pulse: 73  Resp: 17  Temp: (!) 97.4 F (36.3 C)  SpO2: 100%    GENERAL:alert, appears well, in no distress. SKIN: skin color, texture, turgor are normal, no rashes or significant lesions EYES: normal, Conjunctiva are pink and non-injected, sclera clear OROPHARYNX: Mild erythema posterior oral cavity with some ulceration. NECK: No palpable bilateral cervical lymphadenopathy. Abdomen: Soft, nontender, G-tube site with some red granulation tissue around the tube, no evidence of  infection. Musculoskeletal:no cyanosis of digits and no clubbing  NEURO: alert & oriented x 3 with fluent speech, no focal motor/sensory deficits  LABORATORY DATA:  I have reviewed the data as listed CBC Latest Ref Rng & Units 03/02/2021 02/02/2021 01/06/2021  WBC 4.0 - 10.5 K/uL 5.3 3.3(L) 4.4  Hemoglobin 13.0 - 17.0 g/dL 9.7(L) 8.7(L) 8.5(L)  Hematocrit 39.0 - 52.0 % 28.3(L) 26.0(L) 25.6(L)  Platelets 150 - 400 K/uL 106(L) 57(L) 77(L)     CMP Latest Ref Rng & Units 02/02/2021 01/06/2021 12/24/2020  Glucose 70 - 99 mg/dL 104(H) 110(H) 95  BUN 8 - 23 mg/dL 26(H) 28(H) 27(H)  Creatinine 0.61 - 1.24 mg/dL 1.11 1.02 0.99  Sodium 135 - 145 mmol/L 132(L) 133(L) 134(L)  Potassium 3.5 - 5.1 mmol/L 4.8 4.5 4.6  Chloride 98 - 111 mmol/L 100 94(L) 101  CO2 22 - 32 mmol/L '25 28 25  '$ Calcium 8.9 - 10.3 mg/dL 9.2 9.4 8.9  Total Protein 6.5 - 8.1 g/dL - - -  Total Bilirubin 0.3 - 1.2 mg/dL  - - -  Alkaline Phos 38 - 126 U/L - - -  AST 15 - 41 U/L - - -  ALT 0 - 44 U/L - - -    RADIOGRAPHIC STUDIES: I have personally reviewed the radiological images as listed and agreed with the findings in the report. No results found.   ASSESSMENT & PLAN:   Chans Ewert is a 80 y.o. male with   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[place]), TxN1M0, HPV(+) CT neck showed enlarged right level 2 node, asymmetric soft tissue at the right tongue base, favored to reflect asymmetric tonsillar issue. -Initial biopsy was inconclusive so he had an excisional biopsy which showed P16 positive right neck poorly differentiated SCC. -PET CT showed primary mucosal neoplasm in the right tongue base area demonstrating hypermetabolism. -He was started on concurrent chemoradiation therapy with cisplatin on 11/09/20. -He developed worsening thrombocytopenia, anemia and leukopenia following cycle 2. Chemo has been held since 11/19/20. -He overall got 100 max per metered square of cisplatin, multiple delays due to cytopenias in the last 2 cycles, he only got 20 mg per metered square.   He is still recovering from recent treatment.  He continues to use G-tube for most of his feeding, occasionally eating some food.  He reports dry mouth, thick saliva and lack of taste. Physical examination today without any concerns except for some erythema and ulcerations which are healing in the posterior oral cavity. I have reviewed his CBC which showed significant improvement.  He will continue G-tube nourishment, follow-up with Dr. Isidore Moos after his end of treatment PET scan and return to clinic for follow-up with me in 3 months.  He was instructed to stay in touch with our nurse navigation team with any questions or concerns.  2. Mucositis due to antineoplastic therapy Pain is almost negligible.  However the discomfort persists.  He is not using any pain medication. However he has some difficulty swallowing food.   He has tried  most of the foods, feels uncomfortable when he tries to swallow food.  Encouraged him to try 1 meal a day by mouth and continue G-tube nourishment.   3. Chemotherapy-induced cytopenia His blood counts show significant improvement compared to last visit.  #4.  Weight loss secondary to poor p.o. intake.  Encouraged to keep up with the G-tube management and add one meal by mouth as tolerated.  Plan: -Completed chemotherapy and radiation, not very well-tolerated. Return to clinic  in mid-October to review imaging with Dr. Isidore Moos. Return to clinic with [place] in 3 months.    Benay Pike, MD 03/02/2021

## 2021-03-03 ENCOUNTER — Encounter (HOSPITAL_BASED_OUTPATIENT_CLINIC_OR_DEPARTMENT_OTHER): Payer: Self-pay | Admitting: Cardiology

## 2021-03-03 ENCOUNTER — Ambulatory Visit (INDEPENDENT_AMBULATORY_CARE_PROVIDER_SITE_OTHER): Payer: Medicare Other | Admitting: Cardiology

## 2021-03-03 VITALS — BP 118/76 | HR 72 | Ht 70.0 in | Wt 177.0 lb

## 2021-03-03 DIAGNOSIS — I10 Essential (primary) hypertension: Secondary | ICD-10-CM | POA: Diagnosis not present

## 2021-03-03 DIAGNOSIS — I2510 Atherosclerotic heart disease of native coronary artery without angina pectoris: Secondary | ICD-10-CM | POA: Diagnosis not present

## 2021-03-03 DIAGNOSIS — I739 Peripheral vascular disease, unspecified: Secondary | ICD-10-CM

## 2021-03-03 DIAGNOSIS — Z9221 Personal history of antineoplastic chemotherapy: Secondary | ICD-10-CM | POA: Diagnosis not present

## 2021-03-03 DIAGNOSIS — R530 Neoplastic (malignant) related fatigue: Secondary | ICD-10-CM

## 2021-03-03 DIAGNOSIS — I714 Abdominal aortic aneurysm, without rupture, unspecified: Secondary | ICD-10-CM

## 2021-03-03 NOTE — Patient Instructions (Signed)
Medication Instructions:  Your physician recommends that you continue on your current medications as directed. Please refer to the Current Medication list given to you today.  *If you need a refill on your cardiac medications before your next appointment, please call your pharmacy*  Testing/Procedures: Your physician has requested that you have an echocardiogram. Echocardiography is a painless test that uses sound waves to create images of your heart. It provides your doctor with information about the size and shape of your heart and how well your heart's chambers and valves are working. This procedure takes approximately one hour. There are no restrictions for this procedure.  Follow-Up: At [facility], you and your health needs are our priority.  As part of our continuing mission to provide you with exceptional heart care, we have created designated Provider Care Teams.  These Care Teams include your primary Cardiologist (physician) and Advanced Practice Providers (APPs -  Physician Assistants and Nurse Practitioners) who all work together to provide you with the care you need, when you need it.  We recommend signing up for the patient portal called "MyChart".  Sign up information is provided on this After Visit Summary.  MyChart is used to connect with patients for Virtual Visits (Telemedicine).  Patients are able to view lab/test results, encounter notes, upcoming appointments, etc.  Non-urgent messages can be sent to your provider as well.   To learn more about what you can do with MyChart, go to [email or web address].    Your next appointment:   3 month(s)  The format for your next appointment:   In Person  Provider:   Buford Dresser, MD

## 2021-03-03 NOTE — Progress Notes (Signed)
Cardiology Office Note:    Date:  03/03/2021   ID:  Jeffrey [name], DOB Jul 23, 1940, MRN [id number]  PCP:  Vivi Barrack, MD  Cardiologist:  Buford Dresser, MD PhD  Referring MD: Vivi Barrack, MD   Chief complaint: follow up  History of Present Illness:    Jeffrey [name] is a 80 y.o. male with a hx of AAA s/p EVAR by Dr. Trula Slade, PAD, hypertension, coronary artery calcification who is seen for follow up today. I initially met him 02/02/18 as a new consult at the request of Dr. [name] for preoperative evaluation prior to vascular surgery.   Today: Finished chemo/radiation for squamous cell carcinoma of the oropharynx (diagnosed 10/2020), has PEG tube in place. Felt terrible with treatment, slowly starting to feel better. Follow up PET scan scheduled for next month. Even with feeding tube, has lost significant weight, from 200 lbs to 174 lbs currently. Couldn't eat at all for 3 mos by mouth, now back to eating by mouth every day with PEG supplemental calories. Cisplatin affected his taste buds as well. Has not had echocardiogram since chemotherapy.  Given his current issues, hypertension and CV have been on the backburner, but no current concerns. Tolerating amlodipine, losartan, atorvastatin. Stopped taking aspirin since surgery in 08/2020. Remains anemia/mildly thrombocytopenic. Blood pressures remained well controlled, no lows during treatment.   ROS positive for generalized fatigue. Trying to gradually increase activity, can barely take steps. Even tired walking around the store for a long time. Also now with worsening upper back pain from prior injury. Has mild dyspnea on exertion, pain in the back stops him first.  Denies chest pain, shortness of breath at rest. No PND, orthopnea, LE edema. No syncope or palpitations.   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   Right Buccal Cheek([facility])   Osteopenia     Past Surgical History:  Procedure Laterality Date   ABDOMINAL AORTIC ENDOVASCULAR FENESTRATED STENT GRAFT N/A 03/21/2018   Procedure: ABDOMINAL AORTIC ENDOVASCULAR FENESTRATED STENT GRAFT ; [place] CT PERFORMED;  Surgeon: [name], MD;  Location: [facility];  Service: Vascular;  Laterality: N/A;   Basal Skin Cancer  2012   CATARACT EXTRACTION Bilateral    IR GASTROSTOMY TUBE MOD SED  11/06/2020   IR IMAGING GUIDED PORT INSERTION  11/06/2020   MASS BIOPSY Right 09/25/2020   Procedure: OPEN NECK MASS BIOPSY;  Surgeon: Leta Baptist, MD;  Location: [facility];  Service: ENT;  Laterality: Right;   TONSILLECTOMY     TRANSURETHRAL RESECTION OF PROSTATE      Current Medications: Current Outpatient Medications on File Prior to Visit  Medication Sig   amLODipine (NORVASC) 5 MG tablet Take 1 tablet (5 mg total) by mouth daily. Please keep your upcoming appointment for refills.   ARTIFICIAL TEAR SOLUTION OP Place 1 drop into both eyes daily as needed (dry eyes).   Ascorbic Acid (VITAMIN C) 1000 MG tablet Take 1,000 mg by mouth daily.   atorvastatin (LIPITOR) 40 MG tablet TAKE 1 TABLET(40 MG) BY MOUTH DAILY AT 6 PM (Patient taking differently: Take 40 mg by mouth daily at 6 PM.)   Cyanocobalamin 1000  MCG TBCR Take 1,000 mcg by mouth daily.   Emollient (CERAVE) LOTN Apply 1 application topically daily as needed (after shower care).    ferrous sulfate 325 (65 FE) MG tablet Take 325 mg by mouth every other day.   fluticasone (FLONASE) 50 MCG/ACT nasal spray Place 1 spray into both nostrils every other day.   GLUCOSAMINE-CHONDROITIN PO Take 1 tablet by mouth 2 (two) times daily.    HYDROcodone-acetaminophen (HYCET) 7.5-325 mg/15 ml solution Take 10 mLs by mouth every 12 (twelve) hours  as needed for moderate pain.   lidocaine (XYLOCAINE) 2 % solution Patient: Mix 1part 2% viscous lidocaine, 1part H20. Swish & swallow 35m of diluted mixture, 329m before meals and at bedtime, up to QID   lidocaine-prilocaine (EMLA) cream Apply 1 application topically as needed (Utilize prior to port-a-cath needle access).   losartan (COZAAR) 25 MG tablet TAKE 1 TABLET(25 MG) BY MOUTH TWICE DAILY (Patient taking differently: Take 25 mg by mouth 2 (two) times daily.)   Nutritional Supplements (KATE FARMS STANDARD 1.4) LIQD 4 Bottles by Percutaneous Endoscopic Gastrostomy route daily.   Omega-3 1000 MG CAPS Take 1,000 mg by mouth 2 (two) times daily.   No current facility-administered medications on file prior to visit.     Allergies:   Ketotifen fumarate, Mixed ragweed, and Triamcinolone   Social History   Tobacco Use   Smoking status: Former    Packs/day: 1.00    Years: 32.00    Pack years: 32.00    Types: Cigarettes    Quit date: 08/17/1990    Years since quitting: 30.5   Smokeless tobacco: Never  Vaping Use   Vaping Use: Never used  Substance Use Topics   Alcohol use: Yes    Comment: Occassionally, "when I feel like it"   Drug use: No    Family History: The patient's family history includes Alcohol abuse in his father; Cancer in his sister; Diabetes in his mother; Heart attack in his father; Heart disease in his father; Hyperlipidemia in his sister; Hypertension in his father, mother, and son; Pancreatic cancer in his brother and sister; Stroke in his mother.  ROS:   Please see the history of present illness.  Additional pertinent ROS:  EKGs/Labs/Other Studies Reviewed:    The following studies were reviewed today: Prior notes. No prior ECGs, stress tests, or echo in the system.  EKG:  EKG is personally reviewed today.   03/03/21 NSR at 72 bpm, nonspecific t wave pattern  Recent Labs: 04/09/2020: TSH 1.54 10/20/2020: ALT 15 03/02/2021: BUN 25; Creatinine 1.04; Hemoglobin  9.7; Magnesium 1.7; Platelet Count 106; Potassium 4.8; Sodium 130  Recent Lipid Panel    Component Value Date/Time   CHOL 123 04/09/2020 1417   CHOL 117 02/03/2020 1134   TRIG 50 04/09/2020 1417   HDL 68 04/09/2020 1417   HDL 60 02/03/2020 1134   CHOLHDL 1.8 04/09/2020 1417   VLDL 12.4 03/26/2019 1403   LDLCALC 43 04/09/2020 1417    Physical Exam:    VS:  BP 118/76 (BP Location: Left Arm, Patient Position: Sitting, Cuff Size: Normal)   Pulse 72   Ht '5\' 10"'  (1.778 m)   Wt 177 lb (80.3 kg)   BMI 25.40 kg/m     Wt Readings from Last 3 Encounters:  03/03/21 177 lb (80.3 kg)  03/02/21 179 lb 3.2 oz (81.3 kg)  02/02/21 183 lb 1.6 oz (83.1 kg)    GEN: Well nourished, well developed in no acute distress HEENT: Normal,  moist mucous membranes NECK: No JVD CARDIAC: regular rhythm, normal S1 and S2, no rubs or gallops. No murmur. VASCULAR: Radial and DP pulses 2+ bilaterally. No carotid bruits RESPIRATORY:  Clear to auscultation without rales, wheezing or rhonchi  ABDOMEN: Soft, non-tender, non-distended. PEG tube in place MUSCULOSKELETAL:  Ambulates independently SKIN: Warm and dry, no edema NEUROLOGIC:  Alert and oriented x 3. No focal neuro deficits noted. PSYCHIATRIC:  Normal affect    ASSESSMENT:    1. Hx antineoplastic chemotherapy   2. Essential hypertension   3. PVD (peripheral vascular disease) ([place])   4. Coronary artery calcification seen on CT scan   5. Neoplastic malignant related fatigue   6. Abdominal aortic aneurysm (AAA) without rupture (HCC)     PLAN:    Squamous cell carcinoma of the oropharynx, s/p cisplatin and radiation -no echo available, with fatigue, DOE, and risk of cardiomyopathy from chemo, will check echo  AAA s/p FEVAR and bilateral renal stents 2019: followed by Dr. Trula Slade -continue statin -aspirin on hold given cytopenia, reassess at follow up -denies claudication, symptoms improved from prior appt  Hypertension:  -continue 25 mg  losartan BID -continue amlodipine 5 mg daily  Coronary calcification on imaging Hyperlipidemia -LDL goal <70 give AAA, coronary calcification -tolerating high intensity statin, atorvastatin 40 mg daily  Cardiovascular risk evaluation and prevention counseling: -recommend heart healthy/Mediterranean diet, with whole grains, fruits, vegetable, fish, lean meats, nuts, and olive oil. Limit salt. -recommend moderate walking, 3-5 times/week for 30-50 minutes each session. Aim for at least 150 minutes.week. Goal should be pace of 3 miles/hours, or walking 1.5 miles in 30 minutes -recommend avoidance of tobacco products. Avoid excess alcohol.  Plan for follow up: 3 mos to discuss symptoms, echo, restarting aspirin. If stable, then can go back to annual visits.  Medication Adjustments/Labs and Tests Ordered: Current medicines are reviewed at length with the patient today.  Concerns regarding medicines are outlined above.  Orders Placed This Encounter  Procedures   EKG 12-Lead   ECHOCARDIOGRAM COMPLETE    No orders of the defined types were placed in this encounter.   Patient Instructions  Medication Instructions:  Your physician recommends that you continue on your current medications as directed. Please refer to the Current Medication list given to you today.  *If you need a refill on your cardiac medications before your next appointment, please call your pharmacy*  Testing/Procedures: Your physician has requested that you have an echocardiogram. Echocardiography is a painless test that uses sound waves to create images of your heart. It provides your doctor with information about the size and shape of your heart and how well your heart's chambers and valves are working. This procedure takes approximately one hour. There are no restrictions for this procedure.  Follow-Up: At [facility], you and your health needs are our priority.  As part of our continuing mission to provide you with  exceptional heart care, we have created designated Provider Care Teams.  These Care Teams include your primary Cardiologist (physician) and Advanced Practice Providers (APPs -  Physician Assistants and Nurse Practitioners) who all work together to provide you with the care you need, when you need it.  We recommend signing up for the patient portal called "MyChart".  Sign up information is provided on this After Visit Summary.  MyChart is used to connect with patients for Virtual Visits (Telemedicine).  Patients are able to view lab/test results, encounter notes, upcoming appointments, etc.  Non-urgent messages can be sent to your  provider as well.   To learn more about what you can do with MyChart, go to [email or web address].    Your next appointment:   3 month(s)  The format for your next appointment:   In Person  Provider:   Buford Dresser, MD     Signed, Buford Dresser, MD PhD 03/03/2021    [place]

## 2021-03-04 ENCOUNTER — Telehealth: Payer: Self-pay | Admitting: Nutrition

## 2021-03-04 NOTE — Telephone Encounter (Signed)
Patient cancelled nutrition follow up.  

## 2021-03-08 ENCOUNTER — Ambulatory Visit (INDEPENDENT_AMBULATORY_CARE_PROVIDER_SITE_OTHER): Payer: Medicare Other

## 2021-03-08 ENCOUNTER — Other Ambulatory Visit: Payer: Self-pay

## 2021-03-08 DIAGNOSIS — R530 Neoplastic (malignant) related fatigue: Secondary | ICD-10-CM | POA: Diagnosis not present

## 2021-03-08 DIAGNOSIS — Z9221 Personal history of antineoplastic chemotherapy: Secondary | ICD-10-CM | POA: Diagnosis not present

## 2021-03-08 LAB — ECHOCARDIOGRAM COMPLETE
Area-P 1/2: 2.91 cm2
MV M vel: 3.08 m/s
MV Peak grad: 37.9 mmHg
S' Lateral: 2.92 cm

## 2021-03-12 ENCOUNTER — Other Ambulatory Visit (HOSPITAL_BASED_OUTPATIENT_CLINIC_OR_DEPARTMENT_OTHER): Payer: Self-pay | Admitting: Cardiology

## 2021-03-12 DIAGNOSIS — I10 Essential (primary) hypertension: Secondary | ICD-10-CM

## 2021-03-17 ENCOUNTER — Other Ambulatory Visit: Payer: Self-pay

## 2021-03-17 ENCOUNTER — Ambulatory Visit: Payer: Medicare Other | Attending: Radiation Oncology

## 2021-03-17 DIAGNOSIS — R1310 Dysphagia, unspecified: Secondary | ICD-10-CM | POA: Diagnosis not present

## 2021-03-17 NOTE — Therapy (Signed)
[facility] [address] [place], [place], [address] Phone: [phone]   Fax:  [phone]  Speech Language Pathology Treatment/ Renewal Summary  Patient Details  Name: Jeffrey [name] MRN: [id number] Date of Birth: 1941/02/11 Referring Provider (SLP): Eppie Gibson, MD   Encounter Date: 03/17/2021   End of Session - 03/17/21 1436     Visit Number 4    Number of Visits 6    Date for SLP Re-Evaluation 06/15/21    SLP Start Time 4    SLP Stop Time  1438    SLP Time Calculation (min) 34 min    Activity Tolerance Patient tolerated treatment well             Past Medical History:  Diagnosis Date   AAA (abdominal aortic aneurysm) ([facility])    Allergy    Arthritis    Colon polyps    Coronary artery calcification    Eczematous dermatitis    GERD (gastroesophageal reflux disease)    Hypertension    Iliac artery stenosis, left (HCC)    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   Right Buccal Cheek([facility])   Osteopenia     Past Surgical History:  Procedure Laterality Date   ABDOMINAL AORTIC ENDOVASCULAR FENESTRATED STENT GRAFT N/A 03/21/2018   Procedure: ABDOMINAL AORTIC ENDOVASCULAR FENESTRATED STENT GRAFT ; [place] CT PERFORMED;  Surgeon: [name], MD;  Location: [facility];  Service: Vascular;  Laterality: N/A;   Basal Skin Cancer  2012   CATARACT EXTRACTION Bilateral    IR GASTROSTOMY TUBE MOD SED  11/06/2020   IR IMAGING GUIDED PORT INSERTION  11/06/2020   MASS BIOPSY Right 09/25/2020   Procedure: OPEN NECK MASS BIOPSY;  Surgeon: Leta Baptist, MD;  Location: [facility];  Service: ENT;  Laterality: Right;   TONSILLECTOMY     TRANSURETHRAL RESECTION OF PROSTATE      There were no vitals filed for this visit.   Subjective Assessment - 03/17/21 1408     Subjective "I haven't used the feeding tube since last Tuesday." Pt is maintaining weight.  Renewal summary this visit.   Currently in Pain?  No/denies                   ADULT SLP TREATMENT - 03/17/21 1409       General Information   Behavior/Cognition Alert;Cooperative;Pleasant mood      Treatment Provided   Treatment provided Dysphagia      Dysphagia Treatment   Temperature Spikes Noted No    Respiratory Status Room air    Treatment Methods Skilled observation;Therapeutic exercise;Patient/caregiver education    Patient observed directly with PO's Yes    Type of PO's observed Regular;Dysphagia 3 (soft);Thin liquids    Oral Phase Signs & Symptoms Other (comment)   none noted   Pharyngeal Phase Signs & Symptoms Other (comment)   pt cont to report thickened saliva - cleared throat x12 times during session without POs. No clearing with POs.   Other treatment/comments Pt cont to eat multiple items; last 24 hours had omlette, cold cereal, milkshake, Hershey bar, and a roast beef meal (TV dinner). Today he had a Snickers bar and water without overt s/s oral or pharyngeal difficulties. HEP completed with rare min A Caryl Ada), stating he was "doing better" with completing HEP as prescribed. SLP again provided pt with instructions to complete HEP as instructed.      Pain Assessment   Pain Assessment No/denies pain  Assessment / Recommendations / Plan   Plan Continue with current plan of care      Progression Toward Goals   Progression toward goals Progressing toward goals              SLP Education - 03/17/21 1658     Education Details Mendelsohn procedure    Person(s) Educated Patient    Methods Explanation;Demonstration;Verbal cues    Comprehension Verbalized understanding;Returned demonstration;Verbal cues required;Need further instruction               ST SHORT TERM GOALS 03/17/21       SLP SHORT TERM GOAL #1      Title pt will tell SLP why pt is completing HEP with modified independence    Time      Period --   visits, for all STGs    Status Not met (SLP provided total A)           SLP  SHORT TERM GOAL #2    Title pt will complete HEP with rare min A in 2 sessions    Time      Status Not met           SLP SHORT TERM GOAL #3    Title pt will describe 3 overt s/s aspiration PNA with modified independence    Time      Status Met           SLP SHORT TERM GOAL #4    Title pt will tell SLP how a food journal could hasten return to a more normalized diet    Time      Status Deferred                    SLP Long Term Goals - 09/28//22 1637                     SLP LONG TERM GOAL #1    Title pt will complete HEP with modified independence over three visits    Time 1    Period --   visits, for all LTGs    Status Not met           SLP LONG TERM GOAL #2    Title pt will complete HEP with independence over two visits    Time 2    Status Ongoing           SLP LONG TERM GOAL #3    Title pt will describe how to modify HEP over time, and the timeline associated with reduction in HEP frequency with modified independence over two sessions    Time 3    Status Ongoing                     Plan - 03/17/21 1658     Clinical Impression Statement At this time [name]'s swallowing is deemed WNL/WFL with regular diet (snickers bar) and thin liquids - pt has not used PEG tube since Thursday 03-11-21. SLP reviewed his individualized HEP for dysphagia and pt completed each exercise on his own with rare SLP cues faded to modified independent for MEndelsohn, and independent with other portion of HEP. There are no overt s/s aspiration reported by pt at this time. Data indicate that pt's swallow ability will likely decrease over the course of radiation therapy and could very well decline over time following conclusion of their radiation therapy due to muscle disuse atrophy and/or muscle fibrosis. Pt  will cont to need to be seen by SLP in order to assess safety of PO intake, assess the need for recommending any objective swallow assessment, and ensuring pt correctly completes the  individualized HEP. Pt is being renewed this visit. Possibility is there for pt to reduce frequency to every other month next session.    Speech Therapy Frequency --   once approx every 4 weeks   Duration 12 weeks    Treatment/Interventions Aspiration precaution training;Pharyngeal strengthening exercises;Diet toleration management by SLP;Trials of upgraded texture/liquids;Patient/family education;SLP instruction and feedback;Internal/external aids    Potential to Achieve Goals Good    SLP Home Exercise Plan provided today    Consulted and Agree with Plan of Care Patient             Patient will benefit from skilled therapeutic intervention in order to improve the following deficits and impairments:   Dysphagia, unspecified type    Problem List Patient Active Problem List   Diagnosis Date Noted   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[facility]) 12/10/2020   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   Mucositis due to antineoplastic therapy 11/26/2020   Drug induced constipation 11/26/2020   Hypomagnesemia 11/26/2020   Normocytic normochromic anemia 11/19/2020   Port-A-Cath in place 11/09/2020   History of drug allergy 11/09/2020   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 [place], [place], [address] Phone: [phone]   Fax:  [phone]   Name: Dyrell Tuccillo MRN: [id number] Date of Birth: 1941-01-01

## 2021-03-17 NOTE — Patient Instructions (Signed)
   Remember to [facility] when you do the [facility]. Hold the swallow halfway through - that's when the Adam's apple raises.

## 2021-03-25 ENCOUNTER — Telehealth: Payer: Self-pay | Admitting: *Deleted

## 2021-03-25 NOTE — Telephone Encounter (Signed)
CALLED PATIENT TO INFORM OF PET SCAN FOR 04-07-21- ARRIVAL TIME- 1:30 PM @ [facility], PATIENT TO HAVE WATER ONLY- 6 HRS. PRIOR TO TEST, PATIENT TO RECEIVE RESULTS FROM DR. SQUIRE ON 04-09-21 @ 11:20 AM, SPOKE WITH PATIENT AND HE IS AWARE OF THESE APPTS.

## 2021-03-30 ENCOUNTER — Other Ambulatory Visit: Payer: Self-pay | Admitting: Cardiology

## 2021-03-30 ENCOUNTER — Other Ambulatory Visit: Payer: Self-pay | Admitting: Family Medicine

## 2021-03-30 DIAGNOSIS — I10 Essential (primary) hypertension: Secondary | ICD-10-CM

## 2021-03-30 NOTE — Telephone Encounter (Signed)
Rx(s) sent to pharmacy electronically.  

## 2021-04-06 ENCOUNTER — Ambulatory Visit: Payer: Self-pay | Admitting: Radiation Oncology

## 2021-04-07 ENCOUNTER — Encounter: Payer: Medicare Other | Admitting: Dietician

## 2021-04-07 ENCOUNTER — Ambulatory Visit (HOSPITAL_COMMUNITY)
Admission: RE | Admit: 2021-04-07 | Discharge: 2021-04-07 | Disposition: A | Discharge status: Home / Self Care | Payer: Medicare Other | Source: Ambulatory Visit | Attending: Radiation Oncology | Admitting: Radiation Oncology

## 2021-04-07 DIAGNOSIS — Z931 Gastrostomy status: Secondary | ICD-10-CM | POA: Diagnosis not present

## 2021-04-07 DIAGNOSIS — C760 Malignant neoplasm of head, face and neck: Secondary | ICD-10-CM | POA: Diagnosis not present

## 2021-04-07 DIAGNOSIS — C109 Malignant neoplasm of oropharynx, unspecified: Secondary | ICD-10-CM | POA: Diagnosis not present

## 2021-04-07 LAB — GLUCOSE, CAPILLARY: Glucose-Capillary: 105 mg/dL — ABNORMAL HIGH (ref 70–99)

## 2021-04-07 MED ORDER — FLUDEOXYGLUCOSE F - 18 (FDG) INJECTION
8.8000 | Freq: Once | INTRAVENOUS | Status: AC | PRN
  Administered 2021-04-07: 9.44 via INTRAVENOUS

## 2021-04-08 NOTE — Progress Notes (Signed)
Jeffrey [name] presents today for follow-up after completing radiation to his oropharynx on 12/29/2020, and to review PET scan results from 04/07/2021  Pain issues, if any: back pain Using a feeding tube?: no Weight changes, if any:  [employer]   04/09/21 1115  Weight: 176 lb 12.8 oz (80.2 kg)   BP (!) 157/84 (BP Location: Right Arm, Patient Position: Sitting, Cuff Size: Normal)   Pulse 66   Temp 97.9 F (36.6 C) (Skin)   Resp 16   Ht 5\' 9"  (1.753 m)   Wt 176 lb 12.8 oz (80.2 kg)   SpO2 100%   BMI 26.11 kg/m  Swallowing issues, if any: 03/17/2021 Saw Carl Schinke-SLP: "At this time Jeffrey [name]'s swallowing is deemed WNL/WFL with regular diet (snickers bar) and thin liquids - pt has not used PEG tube since Thursday 03-11-21. SLP reviewed his individualized HEP for dysphagia and pt completed each exercise on his own with rare SLP cues faded to modified independent for MEndelsohn, and independent with other portion of HEP. There are no overt s/s aspiration reported by pt at this time." Smoking or chewing tobacco? none Using fluoride trays daily? no Last ENT visit was on: Not since diagnosis Other notable issues, if any: Last saw medical oncologist on 03/02/2021   Patient in for follow up doing well here for PET results.

## 2021-04-09 ENCOUNTER — Other Ambulatory Visit: Payer: Self-pay

## 2021-04-09 ENCOUNTER — Ambulatory Visit
Admission: RE | Admit: 2021-04-09 | Discharge: 2021-04-09 | Disposition: A | Discharge status: Home / Self Care | Payer: Medicare Other | Source: Ambulatory Visit | Attending: Radiation Oncology | Admitting: Radiation Oncology

## 2021-04-09 ENCOUNTER — Encounter: Payer: Self-pay | Admitting: Radiation Oncology

## 2021-04-09 VITALS — BP 157/84 | HR 66 | Temp 97.9°F | Resp 16 | Ht 69.0 in | Wt 176.8 lb

## 2021-04-09 DIAGNOSIS — Z79899 Other long term (current) drug therapy: Secondary | ICD-10-CM | POA: Diagnosis not present

## 2021-04-09 DIAGNOSIS — M899 Disorder of bone, unspecified: Secondary | ICD-10-CM | POA: Insufficient documentation

## 2021-04-09 DIAGNOSIS — C01 Malignant neoplasm of base of tongue: Secondary | ICD-10-CM

## 2021-04-09 DIAGNOSIS — C109 Malignant neoplasm of oropharynx, unspecified: Secondary | ICD-10-CM

## 2021-04-09 DIAGNOSIS — Z923 Personal history of irradiation: Secondary | ICD-10-CM | POA: Insufficient documentation

## 2021-04-09 DIAGNOSIS — Z931 Gastrostomy status: Secondary | ICD-10-CM | POA: Diagnosis not present

## 2021-04-09 DIAGNOSIS — Z08 Encounter for follow-up examination after completed treatment for malignant neoplasm: Secondary | ICD-10-CM | POA: Diagnosis not present

## 2021-04-09 NOTE — Progress Notes (Signed)
Oncology Nurse Navigator Documentation   I met with Jeffrey [name] during his follow up with Dr. Isidore Moos today. He has improved significantly since completing his treatment 12/29/20. He has not used his feeding tube in one month and has maintained his weight with oral intake. Dr. Isidore Moos has placed an order to have it removed. 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 He will also be scheduled with Dr. Benson Norway for post radiation treatment evaluation. I will get him an appointment to see is ENT Dr. Benjamine Mola in 3 months. [name] is aware that he will be getting several phone calls to arrange all his new appointments. He knows to call me if he has any questions or concerns in the future.   Harlow Asa RN, BSN, OCN Head & Neck Oncology Nurse [place] at [facility] Phone # [phone]  Fax # [phone]

## 2021-04-12 ENCOUNTER — Other Ambulatory Visit: Payer: Self-pay

## 2021-04-12 ENCOUNTER — Ambulatory Visit (INDEPENDENT_AMBULATORY_CARE_PROVIDER_SITE_OTHER): Payer: Medicare Other

## 2021-04-12 DIAGNOSIS — Z0000 Encounter for general adult medical examination without abnormal findings: Secondary | ICD-10-CM

## 2021-04-12 NOTE — Patient Instructions (Signed)
Jeffrey [name] , Thank you for taking time to come for your Medicare Wellness Visit. I appreciate your ongoing commitment to your health goals. Please review the following plan we discussed and let me know if I can assist you in the future.   Screening recommendations/referrals: Colonoscopy: No longer required unless directed by PCP Recommended yearly ophthalmology/optometry visit for glaucoma screening and checkup Recommended yearly dental visit for hygiene and checkup  Vaccinations: Influenza vaccine: Due and discussed  Pneumococcal vaccine: Up to date Tdap vaccine: Done 05/05/18 repeat every 10 years  Shingles vaccine: Completed 9/19 & 05/14/19   Covid-19: Completed 1/11, 2/1, 05/04/20 & 10/26/20  Advanced directives: Please bring a copy of your health care power of attorney and living will to the office at your convenience.  Conditions/risks identified: live long  Next appointment: Follow up in one year for your annual wellness visit.   Preventive Care 81 Years and Older, Male Preventive care refers to lifestyle choices and visits with your health care provider that can promote health and wellness. What does preventive care include? A yearly physical exam. This is also called an annual well check. Dental exams once or twice a year. Routine eye exams. Ask your health care provider how often you should have your eyes checked. Personal lifestyle choices, including: Daily care of your teeth and gums. Regular physical activity. Eating a healthy diet. Avoiding tobacco and drug use. Limiting alcohol use. Practicing safe sex. Taking low doses of aspirin every day. Taking vitamin and mineral supplements as recommended by your health care provider. What happens during an annual well check? The services and screenings done by your health care provider during your annual well check will depend on your age, overall health, lifestyle risk factors, and family history of disease. Counseling   Your health care provider may ask you questions about your: Alcohol use. Tobacco use. Drug use. Emotional well-being. Home and relationship well-being. Sexual activity. Eating habits. History of falls. Memory and ability to understand (cognition). Work and work Statistician. Screening  You may have the following tests or measurements: Height, weight, and BMI. Blood pressure. Lipid and cholesterol levels. These may be checked every 5 years, or more frequently if you are over 85 years old. Skin check. Lung cancer screening. You may have this screening every year starting at age 61 if you have a 30-pack-year history of smoking and currently smoke or have quit within the past 15 years. Fecal occult blood test (FOBT) of the stool. You may have this test every year starting at age 6. Flexible sigmoidoscopy or colonoscopy. You may have a sigmoidoscopy every 5 years or a colonoscopy every 10 years starting at age 40. Prostate cancer screening. Recommendations will vary depending on your family history and other risks. Hepatitis C blood test. Hepatitis B blood test. Sexually transmitted disease (STD) testing. Diabetes screening. This is done by checking your blood sugar (glucose) after you have not eaten for a while (fasting). You may have this done every 1-3 years. Abdominal aortic aneurysm (AAA) screening. You may need this if you are a current or former smoker. Osteoporosis. You may be screened starting at age 37 if you are at high risk. Talk with your health care provider about your test results, treatment options, and if necessary, the need for more tests. Vaccines  Your health care provider may recommend certain vaccines, such as: Influenza vaccine. This is recommended every year. Tetanus, diphtheria, and acellular pertussis (Tdap, Td) vaccine. You may need a Td booster every 10  years. Zoster vaccine. You may need this after age 43. Pneumococcal 13-valent conjugate (PCV13) vaccine.  One dose is recommended after age 48. Pneumococcal polysaccharide (PPSV23) vaccine. One dose is recommended after age 8. Talk to your health care provider about which screenings and vaccines you need and how often you need them. This information is not intended to replace advice given to you by your health care provider. Make sure you discuss any questions you have with your health care provider. Document Released: 07/03/2015 Document Revised: 02/24/2016 Document Reviewed: 04/07/2015 Elsevier Interactive Patient Education  2017 [place] Prevention in the Home Falls can cause injuries. They can happen to people of all ages. There are many things you can do to make your home safe and to help prevent falls. What can I do on the outside of my home? Regularly fix the edges of walkways and driveways and fix any cracks. Remove anything that might make you trip as you walk through a door, such as a raised step or threshold. Trim any bushes or trees on the path to your home. Use bright outdoor lighting. Clear any walking paths of anything that might make someone trip, such as rocks or tools. Regularly check to see if handrails are loose or broken. Make sure that both sides of any steps have handrails. Any raised decks and porches should have guardrails on the edges. Have any leaves, snow, or ice cleared regularly. Use sand or salt on walking paths during winter. Clean up any spills in your garage right away. This includes oil or grease spills. What can I do in the bathroom? Use night lights. Install grab bars by the toilet and in the tub and shower. Do not use towel bars as grab bars. Use non-skid mats or decals in the tub or shower. If you need to sit down in the shower, use a plastic, non-slip stool. Keep the floor dry. Clean up any water that spills on the floor as soon as it happens. Remove soap buildup in the tub or shower regularly. Attach bath mats securely with double-sided  non-slip rug tape. Do not have throw rugs and other things on the floor that can make you trip. What can I do in the bedroom? Use night lights. Make sure that you have a light by your bed that is easy to reach. Do not use any sheets or blankets that are too big for your bed. They should not hang down onto the floor. Have a firm chair that has side arms. You can use this for support while you get dressed. Do not have throw rugs and other things on the floor that can make you trip. What can I do in the kitchen? Clean up any spills right away. Avoid walking on wet floors. Keep items that you use a lot in easy-to-reach places. If you need to reach something above you, use a strong step stool that has a grab bar. Keep electrical cords out of the way. Do not use floor polish or wax that makes floors slippery. If you must use wax, use non-skid floor wax. Do not have throw rugs and other things on the floor that can make you trip. What can I do with my stairs? Do not leave any items on the stairs. Make sure that there are handrails on both sides of the stairs and use them. Fix handrails that are broken or loose. Make sure that handrails are as long as the stairways. Check any carpeting to make sure  that it is firmly attached to the stairs. Fix any carpet that is loose or worn. Avoid having throw rugs at the top or bottom of the stairs. If you do have throw rugs, attach them to the floor with carpet tape. Make sure that you have a light switch at the top of the stairs and the bottom of the stairs. If you do not have them, ask someone to add them for you. What else can I do to help prevent falls? Wear shoes that: Do not have high heels. Have rubber bottoms. Are comfortable and fit you well. Are closed at the toe. Do not wear sandals. If you use a stepladder: Make sure that it is fully opened. Do not climb a closed stepladder. Make sure that both sides of the stepladder are locked into place. Ask  someone to hold it for you, if possible. Clearly mark and make sure that you can see: Any grab bars or handrails. First and last steps. Where the edge of each step is. Use tools that help you move around (mobility aids) if they are needed. These include: Canes. Walkers. Scooters. Crutches. Turn on the lights when you go into a dark area. Replace any light bulbs as soon as they burn out. Set up your furniture so you have a clear path. Avoid moving your furniture around. If any of your floors are uneven, fix them. If there are any pets around you, be aware of where they are. Review your medicines with your doctor. Some medicines can make you feel dizzy. This can increase your chance of falling. Ask your doctor what other things that you can do to help prevent falls. This information is not intended to replace advice given to you by your health care provider. Make sure you discuss any questions you have with your health care provider. Document Released: 04/02/2009 Document Revised: 11/12/2015 Document Reviewed: 07/11/2014 Elsevier Interactive Patient Education  2017 [employer].

## 2021-04-12 NOTE — Progress Notes (Signed)
Virtual Visit via Telephone Note  I connected with  Jeffrey [name] on 04/12/21 at 11:00 AM EDT by telephone and verified that I am speaking with the correct person using two identifiers.  Medicare Annual Wellness visit completed telephonically due to Covid-19 pandemic.   Persons participating in this call: This Health Coach and this patient.   Location: Patient: Home Provider: Office   I discussed the limitations, risks, security and privacy concerns of performing an evaluation and management service by telephone and the availability of in person appointments. The patient expressed understanding and agreed to proceed.  Unable to perform video visit due to video visit attempted and failed and/or patient does not have video capability.   Some vital signs may be absent or patient reported.   Willette Brace, LPN   Subjective:   Jeffrey [name] is a 80 y.o. male who presents for Medicare Annual/Subsequent preventive examination.  Review of Systems     Cardiac Risk Factors include: advanced age (>20men, >34 women);hypertension;dyslipidemia;male gender     Objective:    Today's Vitals   04/12/21 1106  PainSc: 4    There is no height or weight on file to calculate BMI.  Advanced Directives 04/12/2021 01/11/2021 12/03/2020 12/03/2020 11/26/2020 11/06/2020 10/30/2020  Does Patient Have a Medical Advance Directive? Yes No No No No No No  Does patient want to make changes to medical advance directive? Yes (MAU/Ambulatory/Procedural Areas - Information given) - - - - - -  Would patient like information on creating a medical advance directive? - No - Patient declined No - Patient declined No - Patient declined - No - Patient declined No - Patient declined    Current Medications (verified) Outpatient Encounter Medications as of 04/12/2021  Medication Sig   amLODipine (NORVASC) 5 MG tablet Take 1 tablet (5 mg total) by mouth daily.   ARTIFICIAL TEAR SOLUTION OP Place 1 drop into both eyes  daily as needed (dry eyes).   Ascorbic Acid (VITAMIN C) 1000 MG tablet Take 1,000 mg by mouth daily.   atorvastatin (LIPITOR) 40 MG tablet Take 1 tablet (40 mg total) by mouth daily at 6 PM.   Cyanocobalamin 1000 MCG TBCR Take 1,000 mcg by mouth daily.   Emollient (CERAVE) LOTN Apply 1 application topically daily as needed (after shower care).    ferrous sulfate 325 (65 FE) MG tablet Take 325 mg by mouth every other day.   GLUCOSAMINE-CHONDROITIN PO Take 1 tablet by mouth 2 (two) times daily.    losartan (COZAAR) 25 MG tablet TAKE 1 TABLET(25 MG) BY MOUTH TWICE DAILY   fluticasone (FLONASE) 50 MCG/ACT nasal spray Place 1 spray into both nostrils every other day. (Patient not taking: Reported on 04/12/2021)   Omega-3 1000 MG CAPS Take 1,000 mg by mouth 2 (two) times daily. (Patient not taking: No sig reported)   [DISCONTINUED] HYDROcodone-acetaminophen (HYCET) 7.5-325 mg/15 ml solution Take 10 mLs by mouth every 12 (twelve) hours as needed for moderate pain. (Patient not taking: No sig reported)   [DISCONTINUED] lidocaine (XYLOCAINE) 2 % solution Patient: Mix 1part 2% viscous lidocaine, 1part H20. Swish & swallow 46mL of diluted mixture, 1min before meals and at bedtime, up to QID (Patient not taking: Reported on 04/09/2021)   [DISCONTINUED] lidocaine-prilocaine (EMLA) cream Apply 1 application topically as needed (Utilize prior to port-a-cath needle access). (Patient not taking: No sig reported)   [DISCONTINUED] Nutritional Supplements (KATE FARMS STANDARD 1.4) LIQD 4 Bottles by Percutaneous Endoscopic Gastrostomy route daily. (Patient not taking: No sig  reported)   No facility-administered encounter medications on file as of 04/12/2021.    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   Right Buccal Cheek(MOHS)   Osteopenia    Past Surgical History:  Procedure Laterality Date   ABDOMINAL AORTIC ENDOVASCULAR FENESTRATED STENT GRAFT N/A 03/21/2018   Procedure: ABDOMINAL AORTIC ENDOVASCULAR FENESTRATED STENT GRAFT ; [place] PERFORMED;  Surgeon: [name], MD;  Location: [facility] OR;  Service: Vascular;  Laterality: N/A;   Basal Skin Cancer  2012   CATARACT EXTRACTION Bilateral    IR GASTROSTOMY TUBE MOD SED  11/06/2020   IR IMAGING GUIDED PORT INSERTION  11/06/2020   MASS BIOPSY Right 09/25/2020   Procedure: OPEN NECK MASS BIOPSY;  Surgeon: Leta Baptist, MD;  Location: [place];  Service: ENT;  Laterality: Right;   TONSILLECTOMY     TRANSURETHRAL RESECTION OF PROSTATE     Family History  Problem Relation Age of Onset   Diabetes Mother    Hypertension Mother    Stroke Mother    Alcohol abuse Father    Heart attack Father    Heart disease Father    Hypertension Father    Cancer Sister    Hyperlipidemia Sister    Pancreatic cancer Sister    Pancreatic cancer Brother    Hypertension Son    Social History   Socioeconomic History   Marital status: Divorced    Spouse name: Not on file   Number of children: 1   Years of education: Not on file   Highest education level: Not on file  Occupational History   Occupation: Retired    Fish farm manager: [employer]  Tobacco Use   Smoking status: Former    Packs/day: 1.00    Years: 32.00    Pack years: 32.00    Types: Cigarettes    Quit date: 08/17/1990    Years since quitting: 30.6   Smokeless tobacco: Never  Vaping Use   Vaping Use: Never used  Substance and Sexual Activity   Alcohol use: Not Currently    Comment: Occassionally, "when I feel like it"   Drug use: No   Sexual activity: Yes    Partners: Female  Other Topics Concern   Not on file   Social History Narrative   Not on file   Social Determinants of Health   Financial Resource Strain: Low Risk    Difficulty of Paying Living Expenses: Not hard at all  Food Insecurity: No Food Insecurity   Worried About Charity fundraiser in the Last Year: Never true   Arboriculturist in the Last Year: Never true  Transportation Needs: No Transportation Needs   Lack of Transportation (Medical): No   Lack of Transportation (Non-Medical): No  Physical Activity: Inactive   Days of Exercise per Week: 0 days   Minutes of Exercise per Session: 0 min  Stress: No Stress Concern Present   Feeling of Stress : Not at all  Social Connections: Moderately Isolated   Frequency of Communication with Friends and Family: More than three times a week  Frequency of Social Gatherings with Friends and Family: Once a week   Attends Religious Services: Never   Marine scientist or Organizations: Yes   Attends Archivist Meetings: Never   Marital Status: Divorced    Tobacco Counseling Counseling given: Not Answered   Clinical Intake:  Pre-visit preparation completed: Yes  Pain : 0-10 Pain Score: 4  Pain Type: Chronic pain Pain Location: Back Pain Orientation: Upper Pain Descriptors / Indicators: Aching, Dull Pain Onset: More than a month ago Pain Frequency: Constant     BMI - recorded: 26.1 Nutritional Status: BMI 25 -29 Overweight Nutritional Risks: None Diabetes: No  How often do you need to have someone help you when you read instructions, pamphlets, or other written materials from your doctor or pharmacy?: 1 - Never  Diabetic?No     Information entered by :: Charlott Rakes, LPN   Activities of Daily Living In your present state of health, do you have any difficulty performing the following activities: 04/12/2021  Hearing? Y  Comment pt stated slight  loss  Vision? N  Difficulty concentrating or making decisions? Y  Comment memory at times  Walking or  climbing stairs? N  Dressing or bathing? N  Doing errands, shopping? N  Preparing Food and eating ? N  Using the Toilet? N  In the past six months, have you accidently leaked urine? N  Do you have problems with loss of bowel control? N  Managing your Medications? N  Managing your Finances? N  Housekeeping or managing your Housekeeping? N  Some recent data might be hidden    Patient Care Team: Vivi Barrack, MD as PCP - General (Family Medicine) Buford Dresser, MD as PCP - Cardiology (Cardiology) Chevis Pretty as Consulting Physician (Dentistry) [name], MD as Consulting Physician (Vascular Surgery) Warren Danes, PA-C as Physician Assistant (Dermatology) Malmfelt, Stephani Police, RN as Oncology Nurse Navigator Eppie Gibson, MD as Consulting Physician (Radiation Oncology) Leta Baptist, MD as Consulting Physician (Otolaryngology) Benay Pike, MD as Consulting Physician (Hematology and Oncology)  Indicate any recent Medical Services you may have received from other than [facility] providers in the past year (date may be approximate).     Assessment:   This is a routine wellness examination for [facility].  Hearing/Vision screen Hearing Screening - Comments:: Pt stated slight hearing loss  Vision Screening - Comments:: Pt follows up with Dr Idolina Primer for annual eye exams   Dietary issues and exercise activities discussed: Current Exercise Habits: The patient does not participate in regular exercise at present   Goals Addressed             This Visit's Progress    Patient Stated       Live long       Depression Screen PHQ 2/9 Scores 04/12/2021 04/06/2020 03/05/2019 02/28/2018 02/27/2017 02/24/2017  PHQ - 2 Score 0 0 0 0 0 0    Fall Risk Fall Risk  04/12/2021 04/06/2020 03/05/2019 02/28/2018 02/27/2017  Falls in the past year? 0 0 0 No Yes  Number falls in past yr: 0 0 0 - 1  Injury with Fall? 0 0 0 - No  Risk for fall due to : Impaired vision;Impaired  balance/gait Impaired balance/gait;Impaired mobility;Impaired vision - - -  Follow up Falls prevention discussed Falls prevention discussed Education provided;Falls evaluation completed - Education provided    FALL RISK PREVENTION PERTAINING TO THE HOME:  Any stairs in or around the home? Yes  If so,  are there any without handrails? No  Home free of loose throw rugs in walkways, pet beds, electrical cords, etc? Yes  Adequate lighting in your home to reduce risk of falls? Yes   ASSISTIVE DEVICES UTILIZED TO PREVENT FALLS:  Life alert? No  Use of a cane, walker or w/c? No  Grab bars in the bathroom? Yes  Shower chair or bench in shower? Yes  Elevated toilet seat or a handicapped toilet? No   TIMED UP AND GO:  Was the test performed? No .   Cognitive Function: MMSE - Mini Mental State Exam 02/27/2017  Orientation to time 5  Orientation to Place 5  Registration 3  Attention/ Calculation 3  Recall 2  Language- name 2 objects 2  Language- repeat 1  Language- follow 3 step command 3  Language- read & follow direction 1  Write a sentence 1  Copy design 1  Total score 27     6CIT Screen 04/12/2021 04/06/2020 03/05/2019 02/28/2018  What Year? 0 points 0 points 0 points 0 points  What month? 0 points 0 points 0 points 0 points  What time? 0 points - 0 points 0 points  Count back from 20 0 points 0 points 0 points 0 points  Months in reverse 0 points 0 points 0 points 0 points  Repeat phrase 0 points 0 points 2 points 10 points  Total Score 0 - 2 10    Immunizations Immunization History  Administered Date(s) Administered   Fluad Quad(high Dose 65+) 03/26/2019, 04/09/2020   Influenza, High Dose Seasonal PF 04/26/2017, 05/05/2018   PFIZER Comirnaty(Gray Top)Covid-19 Tri-Sucrose Vaccine 10/26/2020   PFIZER(Purple Top)SARS-COV-2 Vaccination 07/01/2019, 07/22/2019, 04/24/2020   Pneumococcal Conjugate-13 09/24/2014   Pneumococcal Polysaccharide-23 04/26/2017   Tdap 05/05/2018    Zoster Recombinat (Shingrix) 03/09/2019, 05/14/2019    TDAP status: Up to date  Flu Vaccine status: Due, Education has been provided regarding the importance of this vaccine. Advised may receive this vaccine at local pharmacy or Health Dept. Aware to provide a copy of the vaccination record if obtained from local pharmacy or Health Dept. Verbalized acceptance and understanding.  Pneumococcal vaccine status: Up to date  Covid-19 vaccine status: Completed vaccines  Qualifies for Shingles Vaccine? Yes   Zostavax completed Yes   Shingrix Completed?: Yes  Screening Tests Health Maintenance  Topic Date Due   COVID-19 Vaccine (5 - Booster for Pfizer series) 12/21/2020   INFLUENZA VACCINE  01/18/2021   TETANUS/TDAP  05/05/2028   Pneumonia Vaccine 12+ Years old  Completed   Zoster Vaccines- Shingrix  Completed   HPV VACCINES  Aged Out    Health Maintenance  Health Maintenance Due  Topic Date Due   COVID-19 Vaccine (5 - Booster for Pfizer series) 12/21/2020   INFLUENZA VACCINE  01/18/2021    Colorectal cancer screening: No longer required.    Additional Screening:   Vision Screening: Recommended annual ophthalmology exams for early detection of glaucoma and other disorders of the eye. Is the patient up to date with their annual eye exam?  Yes  Who is the provider or what is the name of the office in which the patient attends annual eye exams? Dr Idolina Primer If pt is not established with a provider, would they like to be referred to a provider to establish care? No .   Dental Screening: Recommended annual dental exams for proper oral hygiene  Community Resource Referral / Chronic Care Management: CRR required this visit?  No   CCM required this visit?  No      Plan:     I have personally reviewed and noted the following in the patient's chart:   Medical and social history Use of alcohol, tobacco or illicit drugs  Current medications and supplements including opioid  prescriptions. Patient is not currently taking opioid prescriptions. Functional ability and status Nutritional status Physical activity Advanced directives List of other physicians Hospitalizations, surgeries, and ER visits in previous 12 months Vitals Screenings to include cognitive, depression, and falls Referrals and appointments  In addition, I have reviewed and discussed with patient certain preventive protocols, quality metrics, and best practice recommendations. A written personalized care plan for preventive services as well as general preventive health recommendations were provided to patient.     Willette Brace, LPN   [id number]   Nurse Notes: None

## 2021-04-13 ENCOUNTER — Encounter: Payer: Self-pay | Admitting: Radiation Oncology

## 2021-04-13 ENCOUNTER — Encounter: Payer: Self-pay | Admitting: Family Medicine

## 2021-04-13 ENCOUNTER — Ambulatory Visit (INDEPENDENT_AMBULATORY_CARE_PROVIDER_SITE_OTHER): Payer: Medicare Other | Admitting: Family Medicine

## 2021-04-13 VITALS — BP 149/82 | HR 65 | Temp 97.9°F | Ht 69.0 in | Wt 176.4 lb

## 2021-04-13 DIAGNOSIS — Z23 Encounter for immunization: Secondary | ICD-10-CM | POA: Diagnosis not present

## 2021-04-13 DIAGNOSIS — I10 Essential (primary) hypertension: Secondary | ICD-10-CM | POA: Diagnosis not present

## 2021-04-13 DIAGNOSIS — K1231 Oral mucositis (ulcerative) due to antineoplastic therapy: Secondary | ICD-10-CM

## 2021-04-13 DIAGNOSIS — R739 Hyperglycemia, unspecified: Secondary | ICD-10-CM | POA: Diagnosis not present

## 2021-04-13 DIAGNOSIS — C109 Malignant neoplasm of oropharynx, unspecified: Secondary | ICD-10-CM

## 2021-04-13 DIAGNOSIS — E785 Hyperlipidemia, unspecified: Secondary | ICD-10-CM

## 2021-04-13 DIAGNOSIS — Z0001 Encounter for general adult medical examination with abnormal findings: Secondary | ICD-10-CM | POA: Diagnosis not present

## 2021-04-13 DIAGNOSIS — Z86010 Personal history of colonic polyps: Secondary | ICD-10-CM

## 2021-04-13 DIAGNOSIS — Z1211 Encounter for screening for malignant neoplasm of colon: Secondary | ICD-10-CM

## 2021-04-13 LAB — COMPREHENSIVE METABOLIC PANEL
ALT: 12 U/L (ref 0–53)
AST: 20 U/L (ref 0–37)
Albumin: 4.5 g/dL (ref 3.5–5.2)
Alkaline Phosphatase: 62 U/L (ref 39–117)
BUN: 23 mg/dL (ref 6–23)
CO2: 22 mEq/L (ref 19–32)
Calcium: 10 mg/dL (ref 8.4–10.5)
Chloride: 95 mEq/L — ABNORMAL LOW (ref 96–112)
Creatinine, Ser: 1.03 mg/dL (ref 0.40–1.50)
GFR: 68.77 mL/min (ref >60.00)
Glucose, Bld: 92 mg/dL (ref 70–99)
Potassium: 4.8 mEq/L (ref 3.5–5.1)
Sodium: 128 mEq/L — ABNORMAL LOW (ref 135–145)
Total Bilirubin: 0.8 mg/dL (ref 0.2–1.2)
Total Protein: 7.5 g/dL (ref 6.0–8.3)

## 2021-04-13 LAB — LIPID PANEL
Cholesterol: 126 mg/dL (ref 0–200)
HDL: 71.5 mg/dL (ref >39.00)
LDL Cholesterol: 44 mg/dL (ref 0–99)
NonHDL: 54.47
Total CHOL/HDL Ratio: 2
Triglycerides: 53 mg/dL (ref 0.0–149.0)
VLDL: 10.6 mg/dL (ref 0.0–40.0)

## 2021-04-13 LAB — CBC
HCT: 30.7 % — ABNORMAL LOW (ref 39.0–52.0)
Hemoglobin: 10.5 g/dL — ABNORMAL LOW (ref 13.0–17.0)
MCHC: 34.3 g/dL (ref 30.0–36.0)
MCV: 100.7 fl — ABNORMAL HIGH (ref 78.0–100.0)
Platelets: 88 10*3/uL — ABNORMAL LOW (ref 150.0–400.0)
RBC: 3.05 Mil/uL — ABNORMAL LOW (ref 4.22–5.81)
RDW: 13.9 % (ref 11.5–15.5)
WBC: 5.2 10*3/uL (ref 4.0–10.5)

## 2021-04-13 LAB — TSH: TSH: 1.75 u[IU]/mL (ref 0.35–5.50)

## 2021-04-13 LAB — HEMOGLOBIN A1C: Hgb A1c MFr Bld: 5.2 % (ref 4.6–6.5)

## 2021-04-13 NOTE — Progress Notes (Signed)
Chief Complaint:  Jeffrey [name] is a 80 y.o. male who presents today for his annual comprehensive physical exam.    Assessment/Plan:  Chronic Problems Addressed Today: HLD (hyperlipidemia) On Lipitor 40 mg daily.  Check labs.  Essential hypertension At goal per JNC 8.  Continue amlodipine 5 mg daily and losartan 25 mg twice daily.  Mucositis due to antineoplastic therapy Recommended good oral hydration.  He can also try hard candies to facilitate saliva production.  Hyperglycemia Check a1c.   History of colon polyps Will refer for colonoscopy.  SCC of neck Follows with oncology.  In remission based on PET scan from last week.  Preventative Healthcare: Flu vaccine given today. Will get blood work done today. Would like to schedule for colonoscopy.   Patient Counseling(The following topics were reviewed and/or handout was given):  -Nutrition: Stressed importance of moderation in sodium/caffeine intake, saturated fat and cholesterol, caloric balance, sufficient intake of fresh fruits, vegetables, and fiber.  -Stressed the importance of regular exercise.   -Substance Abuse: Discussed cessation/primary prevention of tobacco, alcohol, or other drug use; driving or other dangerous activities under the influence; availability of treatment for abuse.   -Injury prevention: Discussed safety belts, safety helmets, smoke detector, smoking near bedding or upholstery.   -Sexuality: Discussed sexually transmitted diseases, partner selection, use of condoms, avoidance of unintended pregnancy and contraceptive alternatives.   -Dental health: Discussed importance of regular tooth brushing, flossing, and dental visits.  -Health maintenance and immunizations reviewed. Please refer to Health maintenance section.  Return to care in 1 year for next preventative visit.     Subjective:  HPI:  He has no acute complaints today.   He was diagnosed with SCC of oropharynx. He finished his  chemoradiation on 12/29/2020.Marland Kitchen He  recovered well. He still experience dry mouth, thick saliva and bad taste bud. He lost few pounds since last visit.  He denies dysphagia or any pain while swallowing food.   Lifestyle Diet: Balanced Exercise: None specific  Depression screen PHQ 2/9 04/12/2021  Decreased Interest 0  Down, Depressed, Hopeless 0  PHQ - 2 Score 0  Some recent data might be hidden    Health Maintenance Due  Topic Date Due   COVID-19 Vaccine (5 - Booster for Pfizer series) 12/21/2020     ROS: Per HPI, otherwise a complete review of systems was negative.   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   Right Buccal Cheek(MOHS)   Osteopenia    Patient Active Problem List   Diagnosis Date Noted   Mucositis due to antineoplastic therapy 11/26/2020   Drug induced constipation 11/26/2020   Normocytic normochromic anemia 11/19/2020   Port-A-Cath in place 11/09/2020   History of drug allergy 11/09/2020   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   Hyperglycemia 04/09/2020   Chronic neck pain 04/09/2020   Coronary artery disease due to calcified coronary lesion 12/27/2018   Osteopenia 02/24/2017   Abdominal aortic aneurysm (AAA) without rupture 07/06/2015   Basal cell carcinoma 07/06/2015   Essential hypertension 07/06/2015  History of colon polyps 07/06/2015   HLD (hyperlipidemia) 07/06/2015   Past Surgical History:  Procedure Laterality Date   ABDOMINAL AORTIC ENDOVASCULAR FENESTRATED STENT GRAFT N/A 03/21/2018   Procedure: ABDOMINAL AORTIC ENDOVASCULAR  FENESTRATED STENT GRAFT ; [place] CT PERFORMED;  Surgeon: [name], MD;  Location: [facility];  Service: Vascular;  Laterality: N/A;   Basal Skin Cancer  2012   CATARACT EXTRACTION Bilateral    IR GASTROSTOMY TUBE MOD SED  11/06/2020   IR IMAGING GUIDED PORT INSERTION  11/06/2020   MASS BIOPSY Right 09/25/2020   Procedure: OPEN NECK MASS BIOPSY;  Surgeon: Leta Baptist, MD;  Location: [place];  Service: ENT;  Laterality: Right;   TONSILLECTOMY     TRANSURETHRAL RESECTION OF PROSTATE      Family History  Problem Relation Age of Onset   Diabetes Mother    Hypertension Mother    Stroke Mother    Alcohol abuse Father    Heart attack Father    Heart disease Father    Hypertension Father    Cancer Sister    Hyperlipidemia Sister    Pancreatic cancer Sister    Pancreatic cancer Brother    Hypertension Son     Medications- reviewed and updated Current Outpatient Medications  Medication Sig Dispense Refill   amLODipine (NORVASC) 5 MG tablet Take 1 tablet (5 mg total) by mouth daily. 90 tablet 3   ARTIFICIAL TEAR SOLUTION OP Place 1 drop into both eyes daily as needed (dry eyes).     Ascorbic Acid (VITAMIN C) 1000 MG tablet Take 1,000 mg by mouth daily.     atorvastatin (LIPITOR) 40 MG tablet Take 1 tablet (40 mg total) by mouth daily at 6 PM. 90 tablet 3   Cyanocobalamin 1000 MCG TBCR Take 1,000 mcg by mouth daily.     Emollient (CERAVE) LOTN Apply 1 application topically daily as needed (after shower care).      ferrous sulfate 325 (65 FE) MG tablet Take 325 mg by mouth every other day.     fluticasone (FLONASE) 50 MCG/ACT nasal spray Place 1 spray into both nostrils every other day.     GLUCOSAMINE-CHONDROITIN PO Take 1 tablet by mouth 2 (two) times daily.      losartan (COZAAR) 25 MG tablet TAKE 1 TABLET(25 MG) BY MOUTH TWICE DAILY 180 tablet 3   Omega-3 1000 MG CAPS Take 1,000 mg by mouth 2 (two) times daily.     No current facility-administered medications for this  visit.    Allergies-reviewed and updated Allergies  Allergen Reactions   Ketotifen Fumarate Swelling and Other (See Comments)    Eye swelling   Mixed Ragweed     Hay fever   Triamcinolone Hives and Rash    Social History   Socioeconomic History   Marital status: Divorced    Spouse name: Not on file   Number of children: 1   Years of education: Not on file   Highest education level: Not on file  Occupational History   Occupation: Retired    Fish farm manager: [employer]  Tobacco Use   Smoking status: Former    Packs/day: 1.00    Years: 32.00    Pack years: 32.00    Types: Cigarettes    Quit date: 08/17/1990    Years since quitting: 30.6   Smokeless tobacco: Never  Vaping Use   Vaping Use: Never used  Substance and Sexual Activity   Alcohol use: Not Currently  Comment: Occassionally, "when I feel like it"   Drug use: No   Sexual activity: Yes    Partners: Female  Other Topics Concern   Not on file  Social History Narrative   Not on file   Social Determinants of Health   Financial Resource Strain: Low Risk    Difficulty of Paying Living Expenses: Not hard at all  Food Insecurity: No Food Insecurity   Worried About Charity fundraiser in the Last Year: Never true   [place] in the Last Year: Never true  Transportation Needs: No Transportation Needs   Lack of Transportation (Medical): No   Lack of Transportation (Non-Medical): No  Physical Activity: Inactive   Days of Exercise per Week: 0 days   Minutes of Exercise per Session: 0 min  Stress: No Stress Concern Present   Feeling of Stress : Not at all  Social Connections: Moderately Isolated   Frequency of Communication with Friends and Family: More than three times a week   Frequency of Social Gatherings with Friends and Family: Once a week   Attends Religious Services: Never   Marine scientist or Organizations: Yes   Attends Archivist Meetings: Never   Marital Status: Divorced         Objective:  Physical Exam: BP (!) 149/82   Pulse 65   Temp 97.9 F (36.6 C) (Temporal)   Ht 5\' 9"  (1.753 m)   Wt 176 lb 6.4 oz (80 kg)   SpO2 95%   BMI 26.05 kg/m   Body mass index is 26.05 kg/m. Wt Readings from Last 3 Encounters:  04/13/21 176 lb 6.4 oz (80 kg)  04/09/21 176 lb 12.8 oz (80.2 kg)  03/03/21 177 lb (80.3 kg)   Gen: NAD, resting comfortably HEENT: TMs normal bilaterally. OP clear. No thyromegaly noted.  CV: RRR with no murmurs appreciated Pulm: NWOB, CTAB with no crackles, wheezes, or rhonchi GI: Normal bowel sounds present. Soft, Nontender, Nondistended. MSK: no edema, cyanosis, or clubbing noted Skin: warm, dry Neuro: CN2-12 grossly intact. Strength 5/5 in upper and lower extremities. Reflexes symmetric and intact bilaterally.  Psych: Normal affect and thought content      I,Savera Zaman,acting as a scribe for Dimas Chyle, MD.,have documented all relevant documentation on the behalf of Dimas Chyle, MD,as directed by  Dimas Chyle, MD while in the presence of Dimas Chyle, MD.   I, Dimas Chyle, MD, have reviewed all documentation for this visit. The documentation on 04/13/21 for the exam, diagnosis, procedures, and orders are all accurate and complete.  Algis Greenhouse. [name], MD 04/13/2021 2:13 PM

## 2021-04-13 NOTE — Progress Notes (Signed)
Radiation Oncology         [phone] ________________________________  Name: Kayla Weekes MRN: [id number]  Date: 04/09/2021  DOB: 1940/12/06  Follow-Up Visit Note  CC: Vivi Barrack, MD  Leta Baptist, MD  Diagnosis and Prior Radiotherapy:       ICD-10-CM   1. 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 Comes presents today for follow-up after completing radiation to his oropharynx on 12/29/2020, and to review PET scan results from 04/07/2021  Pain issues, if any: back pain Using a feeding tube?: no Weight changes, if any:  Filed Weights   04/09/21 1115  Weight: 176 lb 12.8 oz (80.2 kg)   BP (!) 157/84 (BP Location: Right Arm, Patient Position: Sitting, Cuff Size: Normal)   Pulse 66   Temp 97.9 F (36.6 C) (Skin)   Resp 16   Ht 5\' 9"  (1.753 m)   Wt 176 lb 12.8 oz (80.2 kg)   SpO2 100%   BMI 26.11 kg/m  Swallowing issues, if any: 03/17/2021 Saw Carl Schinke-SLP: "At this time [name]'s swallowing is deemed WNL/WFL with regular diet (snickers bar) and thin liquids - pt has not used PEG tube since Thursday 03-11-21. SLP reviewed his individualized HEP for dysphagia and pt completed each exercise on his own with rare SLP cues faded to modified independent for MEndelsohn, and independent with other portion of HEP. There are no overt s/s aspiration reported by pt at this time." Smoking or chewing tobacco? none Using fluoride trays daily? no Last ENT visit was on: Not since diagnosis Other notable issues, if any: Last saw medical oncologist on 03/02/2021  He denies any recent trauma or rib pain.   Patient in for follow up doing well here for PET results.                     ALLERGIES:  is allergic to ketotifen fumarate, mixed ragweed, and triamcinolone.  Meds: Current Outpatient Medications  Medication Sig Dispense Refill   amLODipine (NORVASC) 5 MG tablet Take 1 tablet (5 mg total) by mouth daily. 90 tablet 3   ARTIFICIAL TEAR SOLUTION OP Place 1 drop into both eyes daily as needed (dry eyes).     Ascorbic Acid (VITAMIN C) 1000 MG tablet Take 1,000 mg by mouth daily.     atorvastatin (LIPITOR) 40 MG tablet Take 1 tablet (40 mg total) by mouth daily at 6 PM. 90 tablet 3   Cyanocobalamin 1000 MCG TBCR Take 1,000 mcg by mouth daily.     Emollient (CERAVE) LOTN Apply 1 application topically daily as needed (after shower care).      ferrous sulfate 325 (65 FE) MG tablet Take 325 mg by mouth every other day.     fluticasone (FLONASE) 50 MCG/ACT nasal spray Place 1 spray into both nostrils every other day.     GLUCOSAMINE-CHONDROITIN PO Take 1 tablet by mouth 2 (two) times daily.      losartan (COZAAR) 25 MG tablet TAKE 1 TABLET(25 MG) BY MOUTH TWICE DAILY 180 tablet 3   Omega-3 1000 MG CAPS Take 1,000 mg by mouth 2 (two)  times daily.     No current facility-administered medications for this encounter.    Physical Findings: The patient is in no acute distress. Patient is alert and oriented. Wt Readings from Last 3 Encounters:  04/13/21 176 lb 6.4 oz (80 kg)  04/09/21 176 lb 12.8 oz (80.2 kg)  03/03/21 177 lb (80.3 kg)    height is 5\' 9"  (1.753 m) and weight is 176 lb 12.8 oz (80.2 kg). His skin temperature is 97.9 F (36.6 C). His blood pressure is 157/84 (abnormal) and his pulse is 66. His respiration is 16 and oxygen saturation is 100%. .  General: Alert and oriented, in no acute distress HEENT: Head is normocephalic. Extraocular movements are intact. 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 Affect is appropriate. Heart regular in rate and rhythm Chest clear to auscultation bilaterally  Lab Findings: Lab Results  Component Value Date   WBC 5.2 04/13/2021   HGB 10.5 (L) 04/13/2021   HCT 30.7 (L) 04/13/2021   MCV 100.7 (H) 04/13/2021   PLT 88.0 (L) 04/13/2021    Lab Results  Component Value Date   TSH 1.75 04/13/2021    Radiographic Findings: NM PET Image Restag (PS) Skull Base To Thigh  Result Date: 04/09/2021 CLINICAL DATA:  Subsequent treatment strategy for head neck carcinoma. Squamous cell carcinoma of the RIGHT oropharynx EXAM: NUCLEAR MEDICINE PET SKULL BASE TO THIGH TECHNIQUE: 9.4 mCi F-18 FDG was injected intravenously. Full-ring PET imaging was performed from the skull base to thigh after the radiotracer. CT data was obtained and used for attenuation correction and anatomic localization. Fasting blood glucose: 105 mg/dl COMPARISON:  PET-CT 10/12/2020 FINDINGS: Mediastinal blood pool activity: SUV max 2.7 Liver activity: SUV max not applicable NECK: No asymmetric or increased metabolism in the oropharynx or hypopharynx. Activity associated with the LEFT maxilla is favored odontogenic in origin (image 21) No hypermetabolic cervical or submental lymph nodes. No enlarged lymph nodes. Incidental CT findings: none CHEST: No hypermetabolic mediastinal or hilar nodes. No suspicious pulmonary nodules on the CT scan. Incidental CT findings: Port in the anterior chest wall with tip in distal SVC. ABDOMEN/PELVIS: No abnormal hypermetabolic activity within the liver, pancreas, adrenal glands, or spleen. No hypermetabolic lymph nodes in the abdomen or pelvis. Inflammatory activity associated with the gastrostomy tube tract. Incidental CT findings: Abdominal aortic stent graft noted. SKELETON: New focus of uptake with anterior medial  LEFT fifth rib (image 93). A subtle sclerosis at this level suggest healing fracture. Incidental CT findings: none IMPRESSION: 1. No evidence of head neck carcinoma recurrence within the oropharynx. 2. No evidence of metastatic adenopathy in the neck. 3. No evidence of distant metastatic disease. 4. New focal uptake in anterior LEFT fifth rib is favored posttraumatic. 5. Gastrostomy tube noted. Electronically Signed   By: Suzy Bouchard M.D.   On: 04/09/2021 05:37    Impression/Plan:    1) Head and Neck Cancer Status: I favor that he is in remission and I personally reviewed his images.  I explained that he does have a new focus of uptake in the left fifth rib.  He has no recent trauma to that area or pain.  We will discuss him at tumor board to determine if additional imaging is needed for surveillance.  2) Nutritional Status: Doing  well, taking all food by mouth PEG tube: This will be removed soon  3)   Swallowing doing well, continue speech-language pathology exercises and continue soft foods like yogurt and pudding  4) Dental: Encouraged to continue regular followup with dentistry, and dental hygiene including fluoride rinses.  Anderson Malta  RN will navigate him back to dentistry for posttreatment follow-up as this still has not happened.  5) Thyroid function: Check annually Lab Results  Component Value Date   TSH 1.75 04/13/2021    6)  PT referral for neck edema.   7) Next F/u w/ me in July, sooner PRN    On date of service, in total, I spent 20 minutes on this encounter. Patient was seen in person. _____________________________________   Eppie Gibson, MD

## 2021-04-13 NOTE — Assessment & Plan Note (Signed)
Recommended good oral hydration.  He can also try hard candies to facilitate saliva production.

## 2021-04-13 NOTE — Assessment & Plan Note (Signed)
Will refer for colonoscopy

## 2021-04-13 NOTE — Assessment & Plan Note (Signed)
On Lipitor 40 mg daily.  Check labs. 

## 2021-04-13 NOTE — Patient Instructions (Signed)
It was very nice to see you today!  We will give you your flu vaccine today.  We will check blood work.  Please continue working on staying active.  I will see back in year for your next physical.  Come back sooner if needed.  Take care, Dr [name]  PLEASE NOTE:  If you had any lab tests please let us know if you have not heard back within a few days. You may see your results on mychart before we have a chance to review them but we will give you a call once they are reviewed by [place]. If we ordered any referrals today, please let us know if you have not heard from their office within the next week.   Please try these tips to maintain a healthy lifestyle:  Eat at least 3 REAL meals and 1-2 snacks per day.  Aim for no more than 5 hours between eating.  If you eat breakfast, please do so within one hour of getting up.   Each meal should contain half fruits/vegetables, one quarter protein, and one quarter carbs (no bigger than a computer mouse)  Cut down on sweet beverages. This includes juice, soda, and sweet tea.   Drink at least 1 glass of water with each meal and aim for at least 8 glasses per day  Exercise at least 150 minutes every week.    Preventive Care 80 Years and Older, Male Preventive care refers to lifestyle choices and visits with your health care provider that can promote health and wellness. This includes: A yearly physical exam. This is also called an annual wellness visit. Regular dental and eye exams. Immunizations. Screening for certain conditions. Healthy lifestyle choices, such as: Eating a healthy diet. Getting regular exercise. Not using drugs or products that contain nicotine and tobacco. Limiting alcohol use. What can I expect for my preventive care visit? Physical exam Your health care provider will check your: Height and weight. These may be used to calculate your BMI (body mass index). BMI is a measurement that tells if you are at a healthy  weight. Heart rate and blood pressure. Body temperature. Skin for abnormal spots. Counseling Your health care provider may ask you questions about your: Past medical problems. Family's medical history. Alcohol, tobacco, and drug use. Emotional well-being. Home life and relationship well-being. Sexual activity. Diet, exercise, and sleep habits. History of falls. Memory and ability to understand (cognition). Work and work Statistician. Access to firearms. What immunizations do I need? Vaccines are usually given at various ages, according to a schedule. Your health care provider will recommend vaccines for you based on your age, medical history, and lifestyle or other factors, such as travel or where you work. What tests do I need? Blood tests Lipid and cholesterol levels. These may be checked every 5 years, or more often depending on your overall health. Hepatitis C test. Hepatitis B test. Screening Lung cancer screening. You may have this screening every year starting at age 62 if you have a 30-pack-year history of smoking and currently smoke or have quit within the past 15 years. Colorectal cancer screening. All adults should have this screening starting at age 41 and continuing until age 29. Your health care provider may recommend screening at age 56 if you are at increased risk. You will have tests every 1-10 years, depending on your results and the type of screening test. Prostate cancer screening. Recommendations will vary depending on your family history and other risks. Genital exam to  check for testicular cancer or hernias. Diabetes screening. This is done by checking your blood sugar (glucose) after you have not eaten for a while (fasting). You may have this done every 1-3 years. Abdominal aortic aneurysm (AAA) screening. You may need this if you are a current or former smoker. STD (sexually transmitted disease) testing, if you are at risk. Follow these instructions at  home: Eating and drinking  Eat a diet that includes fresh fruits and vegetables, whole grains, lean protein, and low-fat dairy products. Limit your intake of foods with high amounts of sugar, saturated fats, and salt. Take vitamin and mineral supplements as recommended by your health care provider. Do not drink alcohol if your health care provider tells you not to drink. If you drink alcohol: Limit how much you have to 0-2 drinks a day. Be aware of how much alcohol is in your drink. In the [place] one drink equals one 12 oz bottle of beer (355 mL), one 5 oz glass of wine (148 mL), or one 1 oz glass of hard liquor (44 mL). Lifestyle Take daily care of your teeth and gums. Brush your teeth every morning and night with fluoride toothpaste. Floss one time each day. Stay active. Exercise for at least 30 minutes 5 or more days each week. Do not use any products that contain nicotine or tobacco, such as cigarettes, e-cigarettes, and chewing tobacco. If you need help quitting, ask your health care provider. Do not use drugs. If you are sexually active, practice safe sex. Use a condom or other form of protection to prevent STIs (sexually transmitted infections). Talk with your health care provider about taking a low-dose aspirin or statin. Find healthy ways to cope with stress, such as: Meditation, yoga, or listening to music. Journaling. Talking to a trusted person. Spending time with friends and family. Safety Always wear your seat belt while driving or riding in a vehicle. Do not drive: If you have been drinking alcohol. Do not ride with someone who has been drinking. When you are tired or distracted. While texting. Wear a helmet and other protective equipment during sports activities. If you have firearms in your house, make sure you follow all gun safety procedures. What's next? Visit your health care provider once a year for an annual wellness visit. Ask your health care provider how often  you should have your eyes and teeth checked. Stay up to date on all vaccines. This information is not intended to replace advice given to you by your health care provider. Make sure you discuss any questions you have with your health care provider. Document Revised: 08/14/2020 Document Reviewed: 05/31/2018 Elsevier Patient Education  2022 [employer].

## 2021-04-13 NOTE — Assessment & Plan Note (Signed)
At goal per JNC 8.  Continue amlodipine 5 mg daily and losartan 25 mg twice daily.

## 2021-04-13 NOTE — Assessment & Plan Note (Addendum)
Check a1c 

## 2021-04-14 ENCOUNTER — Other Ambulatory Visit: Payer: Self-pay | Admitting: *Deleted

## 2021-04-14 DIAGNOSIS — E870 Hyperosmolality and hypernatremia: Secondary | ICD-10-CM

## 2021-04-14 NOTE — Progress Notes (Signed)
Please inform patient of the following:  His sodium is down a bit. We should recheck again in a few weeks to make sure it is stable. Ok to place order for [employer].  All of his other labs are stable.  Algis Greenhouse. [name], MD 04/14/2021 7:53 AM

## 2021-04-16 ENCOUNTER — Ambulatory Visit: Payer: Medicare Other | Attending: Radiation Oncology

## 2021-04-16 ENCOUNTER — Other Ambulatory Visit: Payer: Self-pay

## 2021-04-16 DIAGNOSIS — R1310 Dysphagia, unspecified: Secondary | ICD-10-CM | POA: Insufficient documentation

## 2021-04-16 NOTE — Therapy (Signed)
[facility] [address] [place], [place], [address] Phone: [phone]   Fax:  [phone]  Speech Language Pathology Treatment  Patient Details  Name: Jeffrey [name] MRN: [id number] Date of Birth: 1940/08/24 Referring Provider (SLP): Eppie Gibson, MD   Encounter Date: 04/16/2021   End of Session - 04/16/21 1404     Visit Number 5    Number of Visits 6    Date for SLP Re-Evaluation 06/15/21    SLP Start Time 49    SLP Stop Time  32    SLP Time Calculation (min) 36 min    Activity Tolerance Patient tolerated treatment well             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   Right Buccal Cheek([facility])   Osteopenia     Past Surgical History:  Procedure Laterality Date   ABDOMINAL AORTIC ENDOVASCULAR FENESTRATED STENT GRAFT N/A 03/21/2018   Procedure: ABDOMINAL AORTIC ENDOVASCULAR FENESTRATED STENT GRAFT ; [facility] CT PERFORMED;  Surgeon: [name], MD;  Location: [facility];  Service: Vascular;  Laterality: N/A;   Basal Skin Cancer  2012   CATARACT EXTRACTION Bilateral    IR GASTROSTOMY TUBE MOD SED  11/06/2020   IR IMAGING GUIDED PORT INSERTION  11/06/2020   MASS BIOPSY Right 09/25/2020   Procedure: OPEN NECK MASS BIOPSY;  Surgeon: Leta Baptist, MD;  Location: [facility];  Service: ENT;  Laterality: Right;   TONSILLECTOMY     TRANSURETHRAL RESECTION OF PROSTATE      There were no vitals filed for this visit.   Subjective Assessment - 04/16/21 1327     Subjective "[name] used the feeling tube in over a month. It's coming out Tuesday!"    Currently in Pain? No/denies                   ADULT SLP TREATMENT  - 04/16/21 1327       General Information   Behavior/Cognition Alert;Cooperative;Pleasant mood      Treatment Provided   Treatment provided Dysphagia      Dysphagia Treatment   Temperature Spikes Noted No    Respiratory Status Room air    Treatment Methods Skilled observation;Therapeutic exercise;Patient/caregiver education    Patient observed directly with PO's Yes    Type of PO's observed Dysphagia 3 (soft);Thin liquids    Oral Phase Signs & Symptoms Prolonged mastication    Pharyngeal Phase Signs & Symptoms Delayed throat clear   x2   Other treatment/comments Pt had cold cereal, fried eggs, sandwich on plain bread and ribs in the last 24 hours. See above for how pt did today with Lance crackers and water. Pt has done HEP every day since last session! He completed with modified independent (looked at sheet for Caryl Ada hold time) today. No overt s/sx aspiration PNA.      Assessment / Recommendations / Plan   Plan Other (Comment)   every other month; possible d/c next session     Dysphagia Recommendations   Diet recommendations Regular;Dysphagia 3 (mechanical soft);Thin liquid    Medication Administration --   as tolerated  Progression Toward Goals   Progression toward goals Progressing toward goals              SLP SHORT TERM GOAL #1      Title pt will tell SLP why pt is completing HEP with modified independence    Time      Period --   visits, for all STGs    Status Not met (SLP provided total A)           SLP SHORT TERM GOAL #2    Title pt will complete HEP with rare min A in 2 sessions    Time      Status Not met           SLP SHORT TERM GOAL #3    Title pt will describe 3 overt s/s aspiration PNA with modified independence    Time      Status Met           SLP SHORT TERM GOAL #4    Title pt will tell SLP how a food journal could hasten return to a more normalized diet    Time      Status Deferred                                     SLP LONG  TERM GOAL #1    Title pt will complete HEP with modified independence over three visits    Time     Period --   visits, for all LTGs    Status Partially met           SLP LONG TERM GOAL #2    Title pt will complete HEP with independence over two visits    Time 1    Status Ongoing           SLP LONG TERM GOAL #3    Title pt will describe how to modify HEP over time, and the timeline associated with reduction in HEP frequency with modified independence over two sessions    Time 2    Status Ongoing           Plan - 04/16/21 1404     Clinical Impression Statement At this time [name]'s swallowing is deemed WNL/WFL with regular diet (peanut butter cracdkers) and thin liquids - pt has not used PEG tube since Thursday 03-11-21. SLP reviewed his individualized HEP for dysphagia and pt completed each exercise on his own with modified independence. There are no overt s/s aspiration PNA reported by pt nor observed at this time. Data indicate that pt's swallow ability will likely decrease over the course of radiation therapy and could very well decline over time following conclusion of their radiation therapy due to muscle disuse atrophy and/or muscle fibrosis. Pt will cont to need to be seen by SLP in order to assess safety of PO intake, assess the need for recommending any objective swallow assessment, and ensuring pt correctly completes the individualized HEP. REduced to once everyother month. Possible d/c next session.    Speech Therapy Frequency --   once approx every 4 weeks   Duration 12 weeks    Treatment/Interventions Aspiration precaution training;Pharyngeal strengthening exercises;Diet toleration management by SLP;Trials of upgraded texture/liquids;Patient/family education;SLP instruction and feedback;Internal/external aids    Potential to Achieve Goals Good    SLP Home Exercise Plan provided today    Consulted and Agree with Plan of Care  Patient             Patient will benefit from  skilled therapeutic intervention in order to improve the following deficits and impairments:   Dysphagia, unspecified type    Problem List Patient Active Problem List   Diagnosis Date Noted   Mucositis due to antineoplastic therapy 11/26/2020   Drug induced constipation 11/26/2020   Normocytic normochromic anemia 11/19/2020   Port-A-Cath in place 11/09/2020   History of drug allergy 11/09/2020   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 [place], [place], [address] Phone: [phone]   Fax:  [phone]   Name: Jeffrey [name] MRN: [id number] Date of Birth: 10/23/1940

## 2021-04-16 NOTE — Patient Instructions (Signed)
   [facility] at [facility] [phone]  Third driveway on the right

## 2021-04-19 ENCOUNTER — Ambulatory Visit (HOSPITAL_COMMUNITY)
Admission: RE | Admit: 2021-04-19 | Discharge: 2021-04-19 | Disposition: A | Discharge status: Home / Self Care | Payer: Medicare Other | Source: Ambulatory Visit | Attending: Radiation Oncology | Admitting: Radiation Oncology

## 2021-04-19 ENCOUNTER — Other Ambulatory Visit: Payer: Self-pay

## 2021-04-19 DIAGNOSIS — C01 Malignant neoplasm of base of tongue: Secondary | ICD-10-CM | POA: Insufficient documentation

## 2021-04-19 DIAGNOSIS — Z431 Encounter for attention to gastrostomy: Secondary | ICD-10-CM | POA: Insufficient documentation

## 2021-04-19 HISTORY — PX: IR GASTROSTOMY TUBE REMOVAL: IMG5492

## 2021-04-19 MED ORDER — SILVER NITRATE-POT NITRATE 75-25 % EX MISC
CUTANEOUS | Status: DC | PRN
  Administered 2021-04-19: 2 via TOPICAL

## 2021-04-19 MED ORDER — SILVER NITRATE-POT NITRATE 75-25 % EX MISC
CUTANEOUS | Status: AC
  Filled 2021-04-19: qty 10

## 2021-04-19 MED ORDER — LIDOCAINE VISCOUS HCL 2 % MT SOLN
OROMUCOSAL | Status: AC
  Filled 2021-04-19: qty 15

## 2021-04-19 MED ORDER — LIDOCAINE VISCOUS HCL 2 % MT SOLN
OROMUCOSAL | Status: DC | PRN
  Administered 2021-04-19: 10 mL

## 2021-04-19 NOTE — Procedures (Signed)
Per order of Dr. Isidore Moos,  patient's 58 French pull-through gastrostomy tube was removed in its entirety without immediate complications.  Medication used- viscous  lidocaine into gastrostomy tube insertion site tract; silver nitrate application to surrounding granulation tissue.  Gauze dressing was applied over site. Site care instructions were reviewed with patient. EBL< 3 cc.

## 2021-04-20 ENCOUNTER — Telehealth (HOSPITAL_COMMUNITY): Payer: Self-pay

## 2021-04-21 ENCOUNTER — Other Ambulatory Visit: Payer: Self-pay | Admitting: *Deleted

## 2021-04-21 DIAGNOSIS — I714 Abdominal aortic aneurysm, without rupture, unspecified: Secondary | ICD-10-CM

## 2021-04-22 ENCOUNTER — Other Ambulatory Visit: Payer: Self-pay

## 2021-04-22 DIAGNOSIS — C01 Malignant neoplasm of base of tongue: Secondary | ICD-10-CM

## 2021-04-23 ENCOUNTER — Telehealth: Payer: Self-pay | Admitting: Hematology and Oncology

## 2021-04-23 NOTE — Telephone Encounter (Signed)
Sch per 11/4 inbasket , pt aware

## 2021-05-03 ENCOUNTER — Other Ambulatory Visit: Payer: Self-pay

## 2021-05-03 ENCOUNTER — Encounter: Payer: Self-pay | Admitting: Physical Therapy

## 2021-05-03 ENCOUNTER — Ambulatory Visit: Payer: Medicare Other | Attending: Radiation Oncology | Admitting: Physical Therapy

## 2021-05-03 DIAGNOSIS — C01 Malignant neoplasm of base of tongue: Secondary | ICD-10-CM | POA: Insufficient documentation

## 2021-05-03 DIAGNOSIS — R293 Abnormal posture: Secondary | ICD-10-CM | POA: Diagnosis not present

## 2021-05-03 DIAGNOSIS — C109 Malignant neoplasm of oropharynx, unspecified: Secondary | ICD-10-CM | POA: Diagnosis not present

## 2021-05-03 DIAGNOSIS — I890 Lymphedema, not elsewhere classified: Secondary | ICD-10-CM | POA: Diagnosis not present

## 2021-05-03 NOTE — Therapy (Signed)
[place] @ [address], [place], [address] Phone: [phone]   Fax:  [phone]  Physical Therapy Evaluation  Patient Details  Name: Jeffrey [name] MRN: [id number] Date of Birth: 06-03-1941 Referring Provider (PT): Reita May Date: 05/03/2021   PT End of Session - 05/03/21 1138     Visit Number 1    Number of Visits 9    Date for PT Re-Evaluation 05/31/21    PT Start Time 1107    PT Stop Time 1134    PT Time Calculation (min) 27 min    Activity Tolerance Patient tolerated treatment well    Behavior During Therapy [facility] for tasks assessed/performed             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   Right Buccal Cheek(MOHS)   Osteopenia     Past Surgical History:  Procedure Laterality Date   ABDOMINAL AORTIC ENDOVASCULAR FENESTRATED STENT GRAFT N/A 03/21/2018   Procedure: ABDOMINAL AORTIC ENDOVASCULAR FENESTRATED STENT GRAFT ; [place] CT PERFORMED;  Surgeon: [name], MD;  Location: [place];  Service: Vascular;  Laterality: N/A;   Basal Skin Cancer  2012   CATARACT EXTRACTION Bilateral    IR GASTROSTOMY TUBE MOD SED  11/06/2020   IR GASTROSTOMY TUBE REMOVAL  04/19/2021   IR IMAGING GUIDED PORT INSERTION  11/06/2020   MASS BIOPSY Right 09/25/2020   Procedure: OPEN NECK MASS BIOPSY;  Surgeon: Leta Baptist, MD;  Location: [place];  Service: ENT;  Laterality: Right;   TONSILLECTOMY     TRANSURETHRAL RESECTION OF PROSTATE      There were no vitals filed for this visit.    Subjective Assessment - 05/03/21 1108     Subjective I have been doing the stretches every day since you gave them to me. My neck  has felt better than it has in years. I do not notice my neck pain.    Pertinent History Squamous cell carcinoma of oropharynx, Stage 1, 09/11/20 CT neck showed an enlarged right level 2 node. There was also noted to be asymmetric soft tissue at the right tongue base that was favored to reflect asymmetric tonsillar tissue, 10/05/20 excision of the right neck mass revealed  poorly differentiated squamous cell carcinoma. Tumor is p16 +  The nodule is mostly squamous cell carcinoma with a small  amount of lymphoid tissue at the periphery, presumably residual lymph node.  As the tumor has mostly replaced the presumed lymph node, there is almost certainly extra-nodal extension, the extent of which cannot be accurately measured due to the marked paucity of possible residual lymph node, 10/05/20 PET revealed primary mucosal neoplasm in the right tongue base area demonstrating hypermetabolism. The patient was status post excisional biopsy of the enlarged right level 2 lymph node and there were no other enlarged or hypermetabolic neck nodes. There were no PET-CT findings for metastatic disease involving the chest, abdomen, pelvis, or osseous structures, will receive 35 fractions of radiation to his Oropharynx and bilateral neck with weekly cisplatin. He started on 5/23 and will complete 12/29/20.    Patient Stated Goals to do  what I have to do so I don't have to come back    Currently in Pain? Yes    Pain Score 4     Pain Location Back    Pain Orientation Upper    Pain Descriptors / Indicators Aching;Dull    Pain Type Chronic pain    Pain Onset More than a month ago    Pain Frequency Intermittent    Aggravating Factors  nothing    Pain Relieving Factors lying in floor and straightening back    Effect of Pain on Daily Activities I don't climb mountains anymore like I used to, used to walk 5 miles a day but unable to do that now                [facility] PT Assessment - 05/03/21 0001       Assessment   Medical  Diagnosis cancer of base of tongue and oropharynx    Referring Provider (PT) Squire    Onset Date/Surgical Date 09/11/20    Hand Dominance Right    Prior Therapy Dec 2021 went to PT for back and neck pain, baselines in Aug 2022      Precautions   Precautions Other (comment)    Precaution Comments lymphedema      Restrictions   Weight Bearing Restrictions No      Balance Screen   Has the patient fallen in the past 6 months No    Has the patient had a decrease in activity level because of a fear of falling?  No    Is the patient reluctant to leave their home because of a fear of falling?  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   Overall Cognitive Status Within Functional Limits for tasks assessed      Functional Tests   Functional tests Sit to Stand      Posture/Postural Control   Posture/Postural Control Postural limitations    Postural Limitations Rounded Shoulders;Forward head;Increased thoracic kyphosis      AROM   Overall AROM Comments R shoulder ROM WFL, L is 25% limited - pt reports he has pain in L shoulder that began around 3 weeks ago and he thinks push ups or something else he did aggrevated it    Cervical Flexion WFL    Cervical Extension WFL    Cervical - Right Side Bend WFL    Cervical - Left Side Bend WFL    Cervical - Right Rotation WFL    Cervical - Left Rotation WFL               LYMPHEDEMA/ONCOLOGY QUESTIONNAIRE - 05/03/21 0001       Head and Neck   4 cm superior to sternal notch around neck 43.5 cm    6 cm superior to sternal notch around neck 43.5 cm    8 cm superior to sternal notch around neck 48.4 cm                     Objective  measurements completed on examination: See above findings.  PT Education - 05/03/21 1147     Education Details anatomy and physiology of the lymphatic system, how lymphedema is treated, compression pump, compression garment    Person(s) Educated Patient    Methods Explanation    Comprehension Verbalized understanding                 PT Long Term Goals - 05/03/21 1143       PT LONG TERM GOAL #1   Title Pt will demonstrate independence in self MLD for long term management of lymphedema.    Time 4    Period Weeks    Status New    Target Date 05/31/21      PT LONG TERM GOAL #2   Title Pt will receive appropriate compression garment for long term management of lymphedema.    Time 4    Period Weeks    Status New    Target Date 05/31/21      PT LONG TERM GOAL #3   Title Pt will demonstrate a 1.5 cm decrease in edema 8 cm superior to sternal notch to decrease risk of infection.    Baseline 48.4 cm    Time 4    Period Weeks    Status New    Target Date 05/31/21                    Plan - 05/03/21 1139     Clinical Impression Statement Pt repors to PT with recent onset of neck lymphedema. He completed chemo and radiation for treatment of base of tongue and oropharynx cancer in Aug 2022. Pt reports he did not really notice it but his doctor did. He reports he does see he has a "[place] neck." Educated pt about the anatomy and physiology of the lymphatic system and the signs and treatment for lymphedema. His neck ROM is WFL and pt is consistent about doing his exercises. He would benefit from skilled PT services to decrease anterior neck lymphedema and assist pt with obtaining an appropriate compression garment for long term management of lymphedema. Also educated about a compression pump and pt is considering this option.    Stability/Clinical Decision Making Stable/Uncomplicated    Clinical Decision Making Low    Rehab Potential Good    PT  Frequency 2x / week    PT Duration 4 weeks    PT Treatment/Interventions ADLs/Self Care Home Management;Therapeutic exercise;Patient/family education;Manual lymph drainage;Compression bandaging;Manual techniques;Vasopneumatic Device    PT Next Visit Plan begin MLD to neck and eventually give handout and instruct pt in self MLD, assist pt with getting a long term compression garment    PT Home Exercise Plan head and neck ROM exercises    Consulted and Agree with Plan of Care Patient             Patient will benefit from skilled therapeutic intervention in order to improve the following deficits and impairments:  Postural dysfunction, Increased edema, Decreased knowledge of precautions  Visit Diagnosis: Lymphedema, not elsewhere classified  Abnormal posture  Oropharyngeal carcinoma ([place])     Problem List Patient Active Problem List   Diagnosis Date Noted   Mucositis due to antineoplastic therapy 11/26/2020   Drug induced constipation 11/26/2020   Normocytic normochromic anemia 11/19/2020   Port-A-Cath in place 11/09/2020   History of drug allergy 11/09/2020   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[place]) 10/21/2020   Malignant neoplasm of base of tongue ([place]) 10/21/2020   Hyperglycemia 04/09/2020   Chronic neck  pain 04/09/2020   Coronary artery disease due to calcified coronary lesion 12/27/2018   Osteopenia 02/24/2017   Abdominal aortic aneurysm (AAA) without rupture 07/06/2015   Basal cell carcinoma 07/06/2015   Essential hypertension 07/06/2015   History of colon polyps 07/06/2015   HLD (hyperlipidemia) 07/06/2015    Manus Gunning, PT 05/03/2021, 11:48 AM  [place] @ [address] [place], [place], [address] Phone: [phone]   Fax:  [phone]  Name: Jeffrey [name] MRN: [id number] Date of Birth: 03/22/1941  Manus Gunning, PT 05/03/21 11:48 AM

## 2021-05-04 ENCOUNTER — Encounter: Payer: Self-pay | Admitting: Gastroenterology

## 2021-05-09 ENCOUNTER — Encounter: Payer: Self-pay | Admitting: Family Medicine

## 2021-05-10 ENCOUNTER — Other Ambulatory Visit: Payer: Self-pay | Admitting: *Deleted

## 2021-05-10 DIAGNOSIS — D696 Thrombocytopenia, unspecified: Secondary | ICD-10-CM

## 2021-05-10 NOTE — Telephone Encounter (Signed)
See note

## 2021-05-10 NOTE — Telephone Encounter (Signed)
Lab ordered.

## 2021-05-11 ENCOUNTER — Other Ambulatory Visit (INDEPENDENT_AMBULATORY_CARE_PROVIDER_SITE_OTHER): Payer: Medicare Other

## 2021-05-11 ENCOUNTER — Other Ambulatory Visit: Payer: Self-pay

## 2021-05-11 DIAGNOSIS — D696 Thrombocytopenia, unspecified: Secondary | ICD-10-CM

## 2021-05-11 DIAGNOSIS — E870 Hyperosmolality and hypernatremia: Secondary | ICD-10-CM | POA: Diagnosis not present

## 2021-05-11 LAB — CBC WITH DIFFERENTIAL/PLATELET
Basophils Absolute: 0 10*3/uL (ref 0.0–0.1)
Basophils Relative: 0.3 % (ref 0.0–3.0)
Eosinophils Absolute: 0 10*3/uL (ref 0.0–0.7)
Eosinophils Relative: 0.4 % (ref 0.0–5.0)
HCT: 28.5 % — ABNORMAL LOW (ref 39.0–52.0)
Hemoglobin: 9.8 g/dL — ABNORMAL LOW (ref 13.0–17.0)
Lymphocytes Relative: 11.6 % — ABNORMAL LOW (ref 12.0–46.0)
Lymphs Abs: 0.6 10*3/uL — ABNORMAL LOW (ref 0.7–4.0)
MCHC: 34.4 g/dL (ref 30.0–36.0)
MCV: 98.2 fl (ref 78.0–100.0)
Monocytes Absolute: 1 10*3/uL (ref 0.1–1.0)
Monocytes Relative: 19.5 % — ABNORMAL HIGH (ref 3.0–12.0)
Neutro Abs: 3.6 10*3/uL (ref 1.4–7.7)
Neutrophils Relative %: 68.2 % (ref 43.0–77.0)
Platelets: 91 10*3/uL — ABNORMAL LOW (ref 150.0–400.0)
RBC: 2.9 Mil/uL — ABNORMAL LOW (ref 4.22–5.81)
RDW: 13.9 % (ref 11.5–15.5)
WBC: 5.3 10*3/uL (ref 4.0–10.5)

## 2021-05-11 LAB — COMPREHENSIVE METABOLIC PANEL
ALT: 12 U/L (ref 0–53)
AST: 22 U/L (ref 0–37)
Albumin: 4.5 g/dL (ref 3.5–5.2)
Alkaline Phosphatase: 56 U/L (ref 39–117)
BUN: 20 mg/dL (ref 6–23)
CO2: 24 mEq/L (ref 19–32)
Calcium: 9.8 mg/dL (ref 8.4–10.5)
Chloride: 95 mEq/L — ABNORMAL LOW (ref 96–112)
Creatinine, Ser: 1 mg/dL (ref 0.40–1.50)
GFR: 71.21 mL/min (ref >60.00)
Glucose, Bld: 94 mg/dL (ref 70–99)
Potassium: 5.1 mEq/L (ref 3.5–5.1)
Sodium: 129 mEq/L — ABNORMAL LOW (ref 135–145)
Total Bilirubin: 0.9 mg/dL (ref 0.2–1.2)
Total Protein: 7.2 g/dL (ref 6.0–8.3)

## 2021-05-12 NOTE — Progress Notes (Signed)
Please inform patient of the following:  His sodium number is low but stable. His platelet numbers are also slightly improving. I think both of these are probably related to his cancer treatment but since his levels have been stable we can recheck again in 3-6 months. Do not need to make any changes to his treatment plan at this time.  Jeffrey [name]. [name], MD 05/12/2021 7:51 AM

## 2021-05-17 ENCOUNTER — Encounter: Payer: Self-pay | Admitting: Physical Therapy

## 2021-05-17 ENCOUNTER — Other Ambulatory Visit: Payer: Self-pay

## 2021-05-17 ENCOUNTER — Ambulatory Visit: Payer: Medicare Other | Admitting: Physical Therapy

## 2021-05-17 DIAGNOSIS — I890 Lymphedema, not elsewhere classified: Secondary | ICD-10-CM | POA: Diagnosis not present

## 2021-05-17 DIAGNOSIS — R293 Abnormal posture: Secondary | ICD-10-CM | POA: Diagnosis not present

## 2021-05-17 DIAGNOSIS — C109 Malignant neoplasm of oropharynx, unspecified: Secondary | ICD-10-CM

## 2021-05-17 DIAGNOSIS — C01 Malignant neoplasm of base of tongue: Secondary | ICD-10-CM | POA: Diagnosis not present

## 2021-05-17 NOTE — Therapy (Signed)
[place] @ [address] [place], [place], [address] Phone: [phone]   Fax:  [phone]  Physical Therapy Treatment  Patient Details  Name: Jeffrey [name] MRN: [id number] Date of Birth: 05-27-1941 Referring Provider (PT): Isidore Moos   Encounter Date: 05/17/2021   PT End of Session - 05/17/21 1159     Visit Number 2    Number of Visits 9    Date for PT Re-Evaluation 05/31/21    PT Start Time 1109    PT Stop Time 1154    PT Time Calculation (min) 45 min    Activity Tolerance Patient tolerated treatment well    Behavior During Therapy [facility] for tasks assessed/performed             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   Right Buccal Cheek(MOHS)   Osteopenia     Past Surgical History:  Procedure Laterality Date   ABDOMINAL AORTIC ENDOVASCULAR FENESTRATED STENT GRAFT N/A 03/21/2018   Procedure: ABDOMINAL AORTIC ENDOVASCULAR FENESTRATED STENT GRAFT ; [place] CT PERFORMED;  Surgeon: [name], MD;  Location: [place];  Service: Vascular;  Laterality: N/A;   Basal Skin Cancer  2012   CATARACT EXTRACTION Bilateral    IR GASTROSTOMY TUBE MOD SED  11/06/2020   IR GASTROSTOMY TUBE REMOVAL  04/19/2021   IR IMAGING GUIDED PORT INSERTION  11/06/2020   MASS BIOPSY Right 09/25/2020   Procedure: OPEN NECK MASS BIOPSY;  Surgeon: Leta Baptist, MD;  Location: [place];  Service: ENT;  Laterality: Right;   TONSILLECTOMY     TRANSURETHRAL RESECTION OF PROSTATE      There were no vitals filed for this visit.   Subjective Assessment - 05/17/21 1110     Subjective I have been wearing the chip pack everyday for a minimum of 3 hours.     Pertinent History Squamous cell carcinoma of oropharynx, Stage 1, 09/11/20 CT neck showed an enlarged right level 2 node. There was also noted to be asymmetric soft tissue at the right tongue base that was favored to reflect asymmetric tonsillar tissue, 10/05/20 excision of the right neck mass revealed  poorly differentiated squamous cell carcinoma. Tumor is p16 +  The nodule is mostly squamous cell carcinoma with a small  amount of lymphoid tissue at the periphery, presumably residual lymph node.  As the tumor has mostly replaced the presumed lymph node, there is almost certainly extra-nodal extension, the extent of which cannot be accurately measured due to the marked paucity of possible residual lymph node, 10/05/20 PET revealed primary mucosal neoplasm in the right tongue base area demonstrating hypermetabolism. The patient was status post excisional biopsy of the enlarged right level 2 lymph node and there were no other enlarged or hypermetabolic neck nodes. There were no PET-CT findings for metastatic disease involving the chest, abdomen, pelvis, or osseous structures, will receive 35 fractions of radiation to his Oropharynx and bilateral neck with weekly cisplatin. He started on 5/23 and will complete 12/29/20.    Patient Stated Goals to do what I have to do so I don't have to come back    Currently in Pain?  Yes    Pain Score 4     Pain Location Back    Pain Orientation Upper    Pain Descriptors / Indicators Aching;Dull    Pain Type Chronic pain    Pain Onset More than a month ago    Pain Frequency Intermittent    Aggravating Factors  nothing    Pain Relieving Factors lying in the floor and straighthening back    Effect of Pain on Daily Activities unable to exercise like I used to                               [facility] Adult PT Treatment/Exercise - 05/17/21 0001       Manual Therapy   Manual Therapy Manual Lymphatic Drainage (MLD)    Manual Lymphatic Drainage (MLD) in supine  with head elevated: short neck, 5 diaphragmatic breaths, bilateral axillary nodes, bilateral pectoral nodes, bilateral chest, short neck, posterior, lateral and anterior neck then retracing all steps while verbally instructing pt in correct skin stretch technique, anatomy and physiology of the lymphatic system and correct seqeunce                          PT Long Term Goals - 05/03/21 1143       PT LONG TERM GOAL #1   Title Pt will demonstrate independence in self MLD for long term management of lymphedema.    Time 4    Period Weeks    Status New    Target Date 05/31/21      PT LONG TERM GOAL #2   Title Pt will receive appropriate compression garment for long term management of lymphedema.    Time 4    Period Weeks    Status New    Target Date 05/31/21      PT LONG TERM GOAL #3   Title Pt will demonstrate a 1.5 cm decrease in edema 8 cm superior to sternal notch to decrease risk of infection.    Baseline 48.4 cm    Time 4    Period Weeks    Status New    Target Date 05/31/21                   Plan - 05/17/21 1159     Clinical Impression Statement Began MLD today to anterior neck. Instructed pt in anatomy and physiology of the lymphatic system and correct skin stretch technique. Next session will issue [place] head and neck handout and have pt return demonstrate correct technique. Anterior neck was visibly reduced by end of session.    PT Frequency 2x / week    PT Duration 4 weeks    PT Treatment/Interventions ADLs/Self Care Home Management;Therapeutic exercise;Patient/family education;Manual lymph drainage;Compression bandaging;Manual techniques;Vasopneumatic Device    PT Next Visit Plan cont MLD to neck and give [employer] and instruct pt in self MLD, assist pt with getting a long term compression garment    PT Home Exercise Plan head and neck ROM exercises    Consulted and Agree with Plan of Care Patient             Patient will benefit  from skilled therapeutic intervention in order to improve the following deficits and impairments:  Postural dysfunction, Increased edema, Decreased knowledge of precautions  Visit Diagnosis: Lymphedema, not elsewhere classified  Abnormal posture  Oropharyngeal carcinoma ([place])     Problem List Patient  Active Problem List   Diagnosis Date Noted   Mucositis due to antineoplastic therapy 11/26/2020   Drug induced constipation 11/26/2020   Normocytic normochromic anemia 11/19/2020   Port-A-Cath in place 11/09/2020   History of drug allergy 11/09/2020   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[place]) 10/21/2020   Malignant neoplasm of base of tongue ([place]) 10/21/2020   Hyperglycemia 04/09/2020   Chronic neck pain 04/09/2020   Coronary artery disease due to calcified coronary lesion 12/27/2018   Osteopenia 02/24/2017   Abdominal aortic aneurysm (AAA) without rupture 07/06/2015   Basal cell carcinoma 07/06/2015   Essential hypertension 07/06/2015   History of colon polyps 07/06/2015   HLD (hyperlipidemia) 07/06/2015    Manus Gunning, PT 05/17/2021, 12:01 PM  [facility] @ [address] [place], [place], [address] Phone: [phone]   Fax:  [phone]  Name: Jashua Knaak MRN: [id number] Date of Birth: [birth year]   Manus Gunning, PT 05/17/21 12:01 PM

## 2021-05-20 ENCOUNTER — Ambulatory Visit: Payer: Medicare Other | Attending: Radiation Oncology | Admitting: Physical Therapy

## 2021-05-20 ENCOUNTER — Other Ambulatory Visit: Payer: Self-pay

## 2021-05-20 DIAGNOSIS — R1310 Dysphagia, unspecified: Secondary | ICD-10-CM | POA: Insufficient documentation

## 2021-05-20 DIAGNOSIS — R293 Abnormal posture: Secondary | ICD-10-CM

## 2021-05-20 DIAGNOSIS — I890 Lymphedema, not elsewhere classified: Secondary | ICD-10-CM | POA: Diagnosis not present

## 2021-05-20 DIAGNOSIS — C109 Malignant neoplasm of oropharynx, unspecified: Secondary | ICD-10-CM

## 2021-05-20 NOTE — Patient Instructions (Signed)
Marena Unisex Recovery Chin Strap, Mid-Neck Coverage Post-Op Mask - L, Beige Visit the [facility] Store 3.7 out of 5 stars    556 ratings  19 answered questions $40.00 ($40.00 / Count) FREE Returns  Get $60 off instantly: Pay $0.00 upon approval for the [employer]. No annual fee. Color: Beige  Beige    Black Size: Large (Pack of 1)  Large (Pack of 1)     Medium (Pack of 1)     X-Large (Pack of 1)     Small     Small (Pack of 1)     Large     X-Large  CHIN STRAP: Designed with [employer]arena's patented TriFlex fabric, the Marena Recovery Chin Strap Mask features two adjustable closures at the top and base of the head. The gentle but firm compression promotes healing and recovery time for post-facial surgery. MOVE IN COMFORT:  Designed to move with you, the 4-way stretch fabric ensures a perfect fit during fluctuating swelling levels. The gentle but firm compression promotes healing without causing pain or discomfort around the face. ADJUSTABLE:This Chin Strap with Mid-Neck Coverage features a hook and loop closure on the top of the head and another hook and loop closure at the base of the head,allowing the perfect fit. COMPRESSION: Medical-grade compression helps reduce swelling at each step of the healing process. The patented fabric and unique construction helps stimulate the lymphatic system, enabling the body to efficiently remove waste and reduce fluid build-up. IDEAL FOR: Designed for post-surgical support, the [employer] is ideal for post facial surgery, including face lifts, liposuction, chin alterations, laser procedures, maxillofacial operations, and other medical procedures. Additional Details

## 2021-05-20 NOTE — Therapy (Signed)
[place] @ [address] [place], [place], [address] Phone: [phone]   Fax:  [phone]  Physical Therapy Treatment  Patient Details  Name: Jeffrey [name] MRN: [id number] Date of Birth: Nov 01, 1940 Referring Provider (PT): Reita May Date: 05/20/2021   PT End of Session - 05/20/21 1252     Visit Number 3    Number of Visits 9    Date for PT Re-Evaluation 05/31/21    PT Start Time 1200    PT Stop Time 1245    PT Time Calculation (min) 45 min    Activity Tolerance Patient tolerated treatment well    Behavior During Therapy [facility] for tasks assessed/performed             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   Right Buccal Cheek(MOHS)   Osteopenia     Past Surgical History:  Procedure Laterality Date   ABDOMINAL AORTIC ENDOVASCULAR FENESTRATED STENT GRAFT N/A 03/21/2018   Procedure: ABDOMINAL AORTIC ENDOVASCULAR FENESTRATED STENT GRAFT ; [place] CT PERFORMED;  Surgeon: [name], MD;  Location: [place];  Service: Vascular;  Laterality: N/A;   Basal Skin Cancer  2012   CATARACT EXTRACTION Bilateral    IR GASTROSTOMY TUBE MOD SED  11/06/2020   IR GASTROSTOMY TUBE REMOVAL  04/19/2021   IR IMAGING GUIDED PORT INSERTION  11/06/2020   MASS BIOPSY Right 09/25/2020   Procedure: OPEN NECK MASS BIOPSY;  Surgeon: Leta Baptist, MD;  Location: [place];  Service: ENT;  Laterality: Right;   TONSILLECTOMY     TRANSURETHRAL RESECTION OF PROSTATE      There were no vitals filed for this visit.                      [place] Adult PT Treatment/Exercise - 05/20/21 0001       Manual Therapy   Manual Therapy Manual  Lymphatic Drainage (MLD)    Manual therapy comments gave pt information about getting a [place] from [employer]. He will need a size large    Manual Lymphatic Drainage (MLD) pt in sitting, instructed in self MLD using  [employer] and hand over hand instruction. Also performed technique for pt on anterior and posterio neck                          PT Long Term Goals - 05/03/21 1143       PT LONG TERM GOAL #1   Title Pt will demonstrate independence in self MLD for long term management of lymphedema.    Time 4    Period Weeks    Status New    Target Date 05/31/21      PT LONG TERM GOAL #2   Title Pt will receive appropriate compression garment for long term management of lymphedema.    Time 4    Period Weeks    Status New    Target Date 05/31/21      PT LONG TERM GOAL #3   Title Pt will demonstrate a 1.5 cm decrease in edema 8 cm superior  to sternal notch to decrease risk of infection.    Baseline 48.4 cm    Time 4    Period Weeks    Status New    Target Date 05/31/21                   Plan - 05/20/21 1252     Clinical Impression Statement Pt continues with soft lymphedema in anterior neck.  He was instructed in self MLD technique and encouraged to cotninue doing it at home along with wearing chip pack. Showed pt Marena garment on [employer] and gave him infomration on how to get it should he decide to do that.    Stability/Clinical Decision Making Stable/Uncomplicated    Rehab Potential Good    PT Frequency 2x / week    PT Duration 4 weeks    PT Treatment/Interventions ADLs/Self Care Home Management;Therapeutic exercise;Patient/family education;Manual lymph drainage;Compression bandaging;Manual techniques;Vasopneumatic Device    PT Next Visit Plan cont MLD to neck and review [facility] handout and instruct pt in self MLD, assist pt with getting a long term compression garment. Did pt decide to get [place] ?    PT Home Exercise Plan head and neck  ROM exercises    Consulted and Agree with Plan of Care Patient             Patient will benefit from skilled therapeutic intervention in order to improve the following deficits and impairments:  Postural dysfunction, Increased edema, Decreased knowledge of precautions  Visit Diagnosis: Lymphedema, not elsewhere classified  Abnormal posture  Oropharyngeal carcinoma ([place])     Problem List Patient Active Problem List   Diagnosis Date Noted   Mucositis due to antineoplastic therapy 11/26/2020   Drug induced constipation 11/26/2020   Normocytic normochromic anemia 11/19/2020   Port-A-Cath in place 11/09/2020   History of drug allergy 11/09/2020   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[place]) 10/21/2020   Malignant neoplasm of base of tongue ([place]) 10/21/2020   Hyperglycemia 04/09/2020   Chronic neck pain 04/09/2020   Coronary artery disease due to calcified coronary lesion 12/27/2018   Osteopenia 02/24/2017   Abdominal aortic aneurysm (AAA) without rupture 07/06/2015   Basal cell carcinoma 07/06/2015   Essential hypertension 07/06/2015   History of colon polyps 07/06/2015   HLD (hyperlipidemia) 07/06/2015   Jeffrey [name] PT  Norwood Levo, PT 05/20/2021, 12:55 PM  [place] @ [place], [place], [address] Phone: [phone]   Fax:  [phone]  Name: Jeffrey [name] MRN: [id number] Date of Birth: Apr 20, 1941

## 2021-05-24 ENCOUNTER — Ambulatory Visit: Payer: Medicare Other | Admitting: Surgery

## 2021-05-24 ENCOUNTER — Other Ambulatory Visit (HOSPITAL_COMMUNITY): Payer: Medicare Other

## 2021-05-26 ENCOUNTER — Other Ambulatory Visit: Payer: Self-pay

## 2021-05-26 ENCOUNTER — Encounter: Payer: Self-pay | Admitting: Physical Therapy

## 2021-05-26 ENCOUNTER — Ambulatory Visit: Payer: Medicare Other | Admitting: Physical Therapy

## 2021-05-26 DIAGNOSIS — I890 Lymphedema, not elsewhere classified: Secondary | ICD-10-CM | POA: Diagnosis not present

## 2021-05-26 DIAGNOSIS — R1310 Dysphagia, unspecified: Secondary | ICD-10-CM | POA: Diagnosis not present

## 2021-05-26 DIAGNOSIS — C109 Malignant neoplasm of oropharynx, unspecified: Secondary | ICD-10-CM

## 2021-05-26 DIAGNOSIS — R293 Abnormal posture: Secondary | ICD-10-CM | POA: Diagnosis not present

## 2021-05-26 NOTE — Therapy (Signed)
[facility] @ [place], [place], [address] Phone: [phone]   Fax:  [phone]  Physical Therapy Treatment  Patient Details  Name: Jeffrey [name] MRN: [id number] Date of Birth: [birth year] Referring Provider (PT): Isidore Moos   Encounter Date: 05/26/2021   PT End of Session - 05/26/21 1200     Visit Number 4    Number of Visits 9    Date for PT Re-Evaluation 05/31/21    PT Start Time 1103    PT Stop Time 3762    PT Time Calculation (min) 55 min    Activity Tolerance Patient tolerated treatment well    Behavior During Therapy [facility] for tasks assessed/performed             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   Right Buccal Cheek([facility])   Osteopenia     Past Surgical History:  Procedure Laterality Date   ABDOMINAL AORTIC ENDOVASCULAR FENESTRATED STENT GRAFT N/A 03/21/2018   Procedure: ABDOMINAL AORTIC ENDOVASCULAR FENESTRATED STENT GRAFT ; [place] [facility] PERFORMED;  Surgeon: [name], MD;  Location: [facility];  Service: Vascular;  Laterality: N/A;   Basal Skin Cancer  2012   CATARACT EXTRACTION Bilateral    IR GASTROSTOMY TUBE MOD SED  11/06/2020   IR GASTROSTOMY TUBE REMOVAL  04/19/2021   IR IMAGING GUIDED PORT INSERTION  11/06/2020   MASS BIOPSY Right 09/25/2020   Procedure: OPEN NECK MASS BIOPSY;  Surgeon: Leta Baptist, MD;  Location: [facility];  Service: ENT;  Laterality: Right;   TONSILLECTOMY     TRANSURETHRAL RESECTION OF PROSTATE      There were no vitals filed for this visit.   Subjective Assessment - 05/26/21 1104     Subjective I have been doing the massage every day since [place] instructed me. I ordered the  [place]. I have been wearing it.    Pertinent History Squamous cell carcinoma of oropharynx, Stage 1, 09/11/20 CT neck showed an enlarged right level 2 node. There was also noted to be asymmetric soft tissue at the right tongue base that was favored to reflect asymmetric tonsillar tissue, 10/05/20 excision of the right neck mass revealed  poorly differentiated squamous cell carcinoma. Tumor is p16 +  The nodule is mostly squamous cell carcinoma with a small  amount of lymphoid tissue at the periphery, presumably residual lymph node.  As the tumor has mostly replaced the presumed lymph node, there is almost certainly extra-nodal extension, the extent of which cannot be accurately measured due to the marked paucity of possible residual lymph node, 10/05/20 PET revealed primary mucosal neoplasm in the right tongue base area demonstrating hypermetabolism. The patient was status post excisional biopsy of the enlarged right level 2 lymph node and there were no other enlarged or hypermetabolic neck nodes. There were no PET-CT findings for metastatic disease involving the chest, abdomen, pelvis, or osseous structures, will receive 35 fractions of radiation to his Oropharynx and bilateral neck with weekly cisplatin. He started on 5/23 and will complete 12/29/20.    Patient Stated Goals to do what I have to do so I don't have to  come back    Currently in Pain? Yes    Pain Score 4     Pain Location Back    Pain Orientation Upper    Pain Descriptors / Indicators Aching;Dull    Pain Type Chronic pain    Pain Onset More than a month ago    Pain Frequency Intermittent    Aggravating Factors  nothing    Pain Relieving Factors lying in the floor and straightening back    Effect of Pain on Daily Activities unable to exercise like I used to                               [facility] Adult PT Treatment/Exercise - 05/26/21 0001       Manual Therapy   Manual Therapy Manual Lymphatic Drainage  (MLD);Edema management    Edema Management cut 1/2 foam and placed in thick stockinette and issued to pt to wear in his [place], also made a thinner chip pack that he can wear as well in the [facility]    Manual Lymphatic Drainage (MLD) In sitting had pt demonstrate entire sequence with therapist providing max verbal and tactle cues as needed for correct skin stretch and sequence: short neck, 5 diaphragmatic breaths, bilateral axillary nodes, bilateral pectoral nodes, bilateral chest, short neck, posterior, lateral and anterior neck                          PT Long Term Goals - 05/03/21 1143       PT LONG TERM GOAL #1   Title Pt will demonstrate independence in self MLD for long term management of lymphedema.    Time 4    Period Weeks    Status New    Target Date 05/31/21      PT LONG TERM GOAL #2   Title Pt will receive appropriate compression garment for long term management of lymphedema.    Time 4    Period Weeks    Status New    Target Date 05/31/21      PT LONG TERM GOAL #3   Title Pt will demonstrate a 1.5 cm decrease in edema 8 cm superior to sternal notch to decrease risk of infection.    Baseline 48.4 cm    Time 4    Period Weeks    Status New    Target Date 05/31/21                   Plan - 05/26/21 1201     Clinical Impression Statement Pt did not feel comfortable with self MLD. He reports he has been practicing it but is not sure he is doing it correctly. Had pt follow [employer] and demonstrate entire sequence. Pt initially required verbal and tactile cues for correct skin stretch but demonstrated increased independence by end of session. Pt also had been reversing the direction of his stretch at the end instead of the sequence so corrected this. Pt feels better after todays session. Will have pt do the same thing at next session to assess for independence with self MLD.    PT Frequency 2x / week    PT Duration 4 weeks    PT  Treatment/Interventions ADLs/Self Care Home Management;Therapeutic exercise;Patient/family education;Manual lymph drainage;Compression bandaging;Manual techniques;Vasopneumatic Device    PT Next Visit Plan cont MLD to neck and review [employer] and instruct pt in self  MLD, how did foam and chip pack work in [place]?    PT Home Exercise Plan head and neck ROM exercises    Consulted and Agree with Plan of Care Patient             Patient will benefit from skilled therapeutic intervention in order to improve the following deficits and impairments:  Postural dysfunction, Increased edema, Decreased knowledge of precautions  Visit Diagnosis: Lymphedema, not elsewhere classified  Abnormal posture  Oropharyngeal carcinoma ([place])     Problem List Patient Active Problem List   Diagnosis Date Noted   Mucositis due to antineoplastic therapy 11/26/2020   Drug induced constipation 11/26/2020   Normocytic normochromic anemia 11/19/2020   Port-A-Cath in place 11/09/2020   History of drug allergy 11/09/2020   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[place]) 10/21/2020   Malignant neoplasm of base of tongue ([place]) 10/21/2020   Hyperglycemia 04/09/2020   Chronic neck pain 04/09/2020   Coronary artery disease due to calcified coronary lesion 12/27/2018   Osteopenia 02/24/2017   Abdominal aortic aneurysm (AAA) without rupture 07/06/2015   Basal cell carcinoma 07/06/2015   Essential hypertension 07/06/2015   History of colon polyps 07/06/2015   HLD (hyperlipidemia) 07/06/2015    Manus Gunning, PT 05/26/2021, 12:04 PM  [facility] @ [place], [place], [address] Phone: [phone]   Fax:  [phone]  Name: Lennart Gladish MRN: [id number] Date of Birth: [birth year]  Manus Gunning, PT 05/26/21 12:04 PM

## 2021-05-28 ENCOUNTER — Encounter: Payer: Self-pay | Admitting: Gastroenterology

## 2021-05-28 ENCOUNTER — Ambulatory Visit (INDEPENDENT_AMBULATORY_CARE_PROVIDER_SITE_OTHER): Payer: Medicare Other | Admitting: Gastroenterology

## 2021-05-28 VITALS — BP 130/64 | HR 72 | Ht 69.0 in | Wt 170.8 lb

## 2021-05-28 DIAGNOSIS — Z86010 Personal history of colonic polyps: Secondary | ICD-10-CM | POA: Diagnosis not present

## 2021-05-28 NOTE — Progress Notes (Signed)
Review of pertinent gastrointestinal problems: 1.  History of precancerous colon polyps.  Colonoscopy 2008 Dr. Lyla Son found 1 subcentimeter adenomatous polyp.  Colonoscopy Dr. Fuller Plan August 2011 2 subcentimeter polyps were removed, these were both adenomas.  Colonoscopy March 2017 in [facility], full records not available at the time of this visit  HPI: This is a very pleasant 80 year old man who was referred to me by Vivi Barrack, MD  to evaluate history of precancerous colon polyps.    He was previously a patient of Dr. Lenoard Aden and was inadvertently put on my schedule, certainly I am happy to meet him today.  His primary care physician sent him here to discuss his history of precancerous colon polyps and consider repeat colonoscopy.  His last colonoscopy was March 2017 in [place].  He brought with him a large packet of information today that he saved since the procedure.  Unfortunately none of the information shows exactly what was found, what was done, and there were no associated pathology reports.  He has noticed changes in his bowel since chemotherapy with these are working out.  He has no overt GI bleeding.  He has no serious abdominal pains.  Colon cancer does not run in his family  Diagnosed with squamous cell cancer of the oropharynx May 2022, treated with chemotherapy and radiation therapy.  Interventional radiology G-tube placed May 2022.  PET scan October 2022 showed no evidence of head neck cancer recurrence and no clear metastatic disease.  His G-tube was removed 4 weeks ago.  He is eating well.  Blood work November 2022 showed hemoglobin 9.8, platelets 91 complete metabolic profile was normal except for sodium 129.  His slightly low platelets were improving a bit, felt probably due to his oncologic treatments, chemotherapy.  Review of systems: Pertinent positive and negative review of systems were noted in the above HPI section. All other review  negative.   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   Right Buccal Cheek([facility])   Osteopenia     Past Surgical History:  Procedure Laterality Date   ABDOMINAL AORTIC ENDOVASCULAR FENESTRATED STENT GRAFT N/A 03/21/2018   Procedure: ABDOMINAL AORTIC ENDOVASCULAR FENESTRATED STENT GRAFT ; [place] CT PERFORMED;  Surgeon: [name], MD;  Location[facility];  Service: Vascular;  Laterality: N/A;   Basal Skin Cancer  2012   CATARACT EXTRACTION Bilateral    IR GASTROSTOMY TUBE MOD SED  11/06/2020   IR GASTROSTOMY TUBE REMOVAL  04/19/2021   IR IMAGING GUIDED PORT INSERTION  11/06/2020   MASS BIOPSY Right 09/25/2020   Procedure: OPEN NECK MASS BIOPSY;  Surgeon: Leta Baptist, MD;  Location: [facility];  Service: ENT;  Laterality: Right;   TONSILLECTOMY     TRANSURETHRAL RESECTION OF PROSTATE      Current Outpatient Medications  Medication Sig Dispense Refill   amLODipine (NORVASC) 5 MG tablet Take 1 tablet (5 mg total) by mouth daily. 90 tablet 3   ARTIFICIAL TEAR SOLUTION OP Place 1 drop into both eyes daily as needed (dry eyes).     Ascorbic Acid (VITAMIN C) 1000 MG tablet Take 1,000 mg by mouth daily.  atorvastatin (LIPITOR) 40 MG tablet Take 1 tablet (40 mg total) by mouth daily at 6 PM. 90 tablet 3   Cyanocobalamin 1000 MCG TBCR Take 1,000 mcg by mouth daily.     Emollient (CERAVE) LOTN Apply 1 application topically daily as needed (after shower care).      ferrous sulfate 325 (65 FE) MG tablet Take 325 mg by mouth every other day.     fluticasone (FLONASE) 50 MCG/ACT nasal spray Place 1 spray into both nostrils every other day.     GLUCOSAMINE-CHONDROITIN PO Take 1 tablet by mouth 2 (two) times  daily.      losartan (COZAAR) 25 MG tablet TAKE 1 TABLET(25 MG) BY MOUTH TWICE DAILY 180 tablet 3   Omega-3 1000 MG CAPS Take 1,000 mg by mouth 2 (two) times daily.     No current facility-administered medications for this visit.    Allergies as of 05/28/2021 - Review Complete 05/28/2021  Allergen Reaction Noted   Ketotifen fumarate Swelling and Other (See Comments) 07/17/2015   Mixed ragweed  04/06/2020   Triamcinolone Hives and Rash 07/17/2015    Family History  Problem Relation Age of Onset   Diabetes Mother    Hypertension Mother    Stroke Mother    Alcohol abuse Father    Heart attack Father    Heart disease Father    Hypertension Father    Cancer Sister    Hyperlipidemia Sister    Pancreatic cancer Sister    Pancreatic cancer Brother    Hypertension Son     Social History   Socioeconomic History   Marital status: Divorced    Spouse name: Not on file   Number of children: 1   Years of education: Not on file   Highest education level: Not on file  Occupational History   Occupation: Retired    Fish farm manager: [employer]  Tobacco Use   Smoking status: Former    Packs/day: 1.00    Years: 32.00    Pack years: 32.00    Types: Cigarettes    Quit date: 08/17/1990    Years since quitting: 30.8   Smokeless tobacco: Never  Vaping Use   Vaping Use: Never used  Substance and Sexual Activity   Alcohol use: Not Currently    Comment: Occassionally, "when I feel like it"   Drug use: No   Sexual activity: Yes    Partners: Female  Other Topics Concern   Not on file  Social History Narrative   Not on file   Social Determinants of Health   Financial Resource Strain: Low Risk    Difficulty of Paying Living Expenses: Not hard at all  Food Insecurity: No Food Insecurity   Worried About Charity fundraiser in the Last Year: Never true   [place] in the Last Year: Never true  Transportation Needs: No Transportation Needs   Lack of Transportation (Medical): No    Lack of Transportation (Non-Medical): No  Physical Activity: Inactive   Days of Exercise per Week: 0 days   Minutes of Exercise per Session: 0 min  Stress: No Stress Concern Present   Feeling of Stress : Not at all  Social Connections: Moderately Isolated   Frequency of Communication with Friends and Family: More than three times a week   Frequency of Social Gatherings with Friends and Family: Once a week   Attends Religious Services: Never   Marine scientist or Organizations: Yes   Attends [employer]  or Organization Meetings: Never   Marital Status: Divorced  Human resources officer Violence: Not At Risk   Fear of Current or Ex-Partner: No   Emotionally Abused: No   Physically Abused: No   Sexually Abused: No     Physical Exam: Ht 5\' 9"  (1.753 m)   Wt 170 lb 12.8 oz (77.5 kg)   BMI 25.22 kg/m  Constitutional: generally well-appearing Psychiatric: alert and oriented x3 Eyes: extraocular movements intact Mouth: oral pharynx moist, no lesions Neck: supple no lymphadenopathy Cardiovascular: heart regular rate and rhythm Lungs: clear to auscultation bilaterally Abdomen: soft, nontender, nondistended, no obvious ascites, no peritoneal signs, normal bowel sounds, nicely healing G-tube site Extremities: no lower extremity edema bilaterally Skin: no lesions on visible extremities   Assessment and plan: 80 y.o. male with personal history of precancerous colon polyps  He is 80 years old.  We discussed that most people stop having surveillance colonoscopies sometime between the age of 48 and 84.  He really is having no significant bowel issues.  I explained that we will reach out to his [place] gastroenterologist to collect records from his 2017 colonoscopy.  Following that I will contact him with my final recommendations.  If he only had 1 or 2 small subcentimeter adenomas removed then current guidelines for polyp surveillance show that he would certainly not need another colonoscopy until  7 years from that date which would be March 2024.  Please see the "Patient Instructions" section for addition details about the plan.   Owens Loffler, MD [facility] 05/28/2021, 9:25 AM  Cc: Vivi Barrack, MD  Total time on date of encounter was 45 minutes (this included time spent preparing to see the patient reviewing records; obtaining and/or reviewing separately obtained history; performing a medically appropriate exam and/or evaluation; counseling and educating the patient and family if present; ordering medications, tests or procedures if applicable; and documenting clinical information in the health record).

## 2021-05-28 NOTE — Patient Instructions (Signed)
If you are age 80 or older, your body mass index should be between 23-30. Your Body mass index is 25.22 kg/m. If this is out of the aforementioned range listed, please consider follow up with your Primary Care Provider. ________________________________________________________  The [facility] providers would like to encourage you to use [facility] to communicate with providers for non-urgent requests or questions.  Due to long hold times on the telephone, sending your provider a message by [facility] may be a faster and more efficient way to get a response.  Please allow 48 business hours for a response.  Please remember that this is for non-urgent requests.  _______________________________________________________  We will contact Dr Rolm Bookbinder for previous colonoscopy results from 2017.  Once records are reviewed, a decision will be made on whether further testing will be needed.  Thank you for entrusting me with your care and choosing [facility].  Dr Ardis Hughs

## 2021-06-01 ENCOUNTER — Other Ambulatory Visit: Payer: Self-pay

## 2021-06-01 ENCOUNTER — Ambulatory Visit: Payer: Medicare Other | Admitting: Physical Therapy

## 2021-06-01 ENCOUNTER — Encounter: Payer: Self-pay | Admitting: Physical Therapy

## 2021-06-01 DIAGNOSIS — I890 Lymphedema, not elsewhere classified: Secondary | ICD-10-CM

## 2021-06-01 DIAGNOSIS — R293 Abnormal posture: Secondary | ICD-10-CM

## 2021-06-01 DIAGNOSIS — C109 Malignant neoplasm of oropharynx, unspecified: Secondary | ICD-10-CM | POA: Diagnosis not present

## 2021-06-01 DIAGNOSIS — R1310 Dysphagia, unspecified: Secondary | ICD-10-CM | POA: Diagnosis not present

## 2021-06-01 NOTE — Therapy (Signed)
[facility] @ [place], [place], [address] Phone: [phone]   Fax:  [phone]  Physical Therapy Treatment  Patient Details  Name: Jeffrey [name] MRN: [id number] Date of Birth: 10/23/1940 Referring Provider (PT): Reita May Date: 06/01/2021   PT End of Session - 06/01/21 1158     Visit Number 5    Number of Visits 9    Date for PT Re-Evaluation 05/31/21    PT Start Time 1107    PT Stop Time 0998    PT Time Calculation (min) 46 min    Activity Tolerance Patient tolerated treatment well    Behavior During Therapy [facility] for tasks assessed/performed             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   Right Buccal Cheek([facility])   Osteopenia     Past Surgical History:  Procedure Laterality Date   ABDOMINAL AORTIC ENDOVASCULAR FENESTRATED STENT GRAFT N/A 03/21/2018   Procedure: ABDOMINAL AORTIC ENDOVASCULAR FENESTRATED STENT GRAFT ; [place] CT PERFORMED;  Surgeon: [name], MD;  Location: [facility];  Service: Vascular;  Laterality: N/A;   Basal Skin Cancer  2012   CATARACT EXTRACTION Bilateral    IR GASTROSTOMY TUBE MOD SED  11/06/2020   IR GASTROSTOMY TUBE REMOVAL  04/19/2021   IR IMAGING GUIDED PORT INSERTION  11/06/2020   MASS BIOPSY Right 09/25/2020   Procedure: OPEN NECK MASS BIOPSY;  Surgeon: Leta Baptist, MD;  Location: [facility];  Service: ENT;  Laterality: Right;   MASS EXCISION     x 2; throat and above lip   TONSILLECTOMY     TRANSURETHRAL RESECTION OF PROSTATE      There were no vitals filed for this visit.   Subjective Assessment - 06/01/21 1107     Subjective I am trying with the self  massage, I still find myself doing what the sheet says instead of what you say to do.    Pertinent History Squamous cell carcinoma of oropharynx, Stage 1, 09/11/20 CT neck showed an enlarged right level 2 node. There was also noted to be asymmetric soft tissue at the right tongue base that was favored to reflect asymmetric tonsillar tissue, 10/05/20 excision of the right neck mass revealed  poorly differentiated squamous cell carcinoma. Tumor is p16 +  The nodule is mostly squamous cell carcinoma with a small  amount of lymphoid tissue at the periphery, presumably residual lymph node.  As the tumor has mostly replaced the presumed lymph node, there is almost certainly extra-nodal extension, the extent of which cannot be accurately measured due to the marked paucity of possible residual lymph node, 10/05/20 PET revealed primary mucosal neoplasm in the right tongue base area demonstrating hypermetabolism. The patient was status post excisional biopsy of the enlarged right level 2 lymph node and there were no other enlarged or hypermetabolic neck nodes. There were no PET-CT findings for metastatic disease involving the chest, abdomen, pelvis, or osseous structures, will receive 35 fractions of radiation to his Oropharynx and bilateral neck with weekly cisplatin. He started on 5/23 and will complete 12/29/20.  Patient Stated Goals to do what I have to do so I don't have to come back    Currently in Pain? Yes    Pain Score 4     Pain Location Back    Pain Orientation Upper    Pain Descriptors / Indicators Aching;Dull    Pain Type Chronic pain    Pain Onset More than a month ago    Pain Frequency Intermittent    Aggravating Factors  nothing    Pain Relieving Factors lying in the floor and straightening back    Effect of Pain on Daily Activities unable to exercise like I used to                               [facility] Adult PT Treatment/Exercise - 06/01/21 0001       Manual Therapy    Edema Management assessed [place] to fit and educated pt to loosen velcro on top of head so it is not as tight especially when using foam chip pack    Manual Lymphatic Drainage (MLD) In sitting had pt demonstrate entire sequence with therapist providing max verbal and tactle cues as needed for correct skin stretch and sequence: short neck, 5 diaphragmatic breaths, bilateral axillary nodes, bilateral pectoral nodes, bilateral chest, short neck, posterior, lateral and anterior neck then had pt lie down in supine and therapist performed MLD on anterior neck then retraced all steps                          PT Long Term Goals - 05/03/21 1143       PT LONG TERM GOAL #1   Title Pt will demonstrate independence in self MLD for long term management of lymphedema.    Time 4    Period Weeks    Status New    Target Date 05/31/21      PT LONG TERM GOAL #2   Title Pt will receive appropriate compression garment for long term management of lymphedema.    Time 4    Period Weeks    Status New    Target Date 05/31/21      PT LONG TERM GOAL #3   Title Pt will demonstrate a 1.5 cm decrease in edema 8 cm superior to sternal notch to decrease risk of infection.    Baseline 48.4 cm    Time 4    Period Weeks    Status New    Target Date 05/31/21                   Plan - 06/01/21 1156     Clinical Impression Statement Had pt return demonstrate correct self MLD technique. Pt was able to do so today with very minimal cueing, He demonstrated an excellent skin stretch and was able to follow correct sequence. After pt demonstrated entire sequence then therapist completed MLD. Pt demonstrates improved skin mobility of anterior neck.    PT Treatment/Interventions ADLs/Self Care Home Management;Therapeutic exercise;Patient/family education;Manual lymph drainage;Compression bandaging;Manual techniques;Vasopneumatic Device    PT Next Visit Plan cont MLD to neck and review [employer] and  instruct pt in self MLD, how did foam and chip pack work in [place] after it was adjsuted    PT Home Exercise Plan head and neck ROM exercises, self MLD    Consulted and Agree with Plan of Care Patient  Patient will benefit from skilled therapeutic intervention in order to improve the following deficits and impairments:  Postural dysfunction, Increased edema, Decreased knowledge of precautions  Visit Diagnosis: Lymphedema, not elsewhere classified  Abnormal posture  Oropharyngeal carcinoma ([place])     Problem List Patient Active Problem List   Diagnosis Date Noted   Mucositis due to antineoplastic therapy 11/26/2020   Drug induced constipation 11/26/2020   Normocytic normochromic anemia 11/19/2020   Port-A-Cath in place 11/09/2020   History of drug allergy 11/09/2020   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[place]) 10/21/2020   Malignant neoplasm of base of tongue ([place]) 10/21/2020   Hyperglycemia 04/09/2020   Chronic neck pain 04/09/2020   Coronary artery disease due to calcified coronary lesion 12/27/2018   Osteopenia 02/24/2017   Abdominal aortic aneurysm (AAA) without rupture 07/06/2015   Basal cell carcinoma 07/06/2015   Essential hypertension 07/06/2015   History of colon polyps 07/06/2015   HLD (hyperlipidemia) 07/06/2015    Manus Gunning, PT 06/01/2021, 12:00 PM  [facility] @ [place], [place], [address] Phone: [phone]   Fax:  [phone]  Name: Jeffrey [name] MRN: [id number] Date of Birth: Aug 15, 1940   Manus Gunning, PT 06/01/21 12:00 PM

## 2021-06-02 ENCOUNTER — Telehealth (INDEPENDENT_AMBULATORY_CARE_PROVIDER_SITE_OTHER): Payer: Medicare Other | Admitting: Cardiology

## 2021-06-02 ENCOUNTER — Encounter (HOSPITAL_BASED_OUTPATIENT_CLINIC_OR_DEPARTMENT_OTHER): Payer: Self-pay | Admitting: Cardiology

## 2021-06-02 VITALS — BP 130/70 | Ht 69.0 in | Wt 170.0 lb

## 2021-06-02 DIAGNOSIS — Z9221 Personal history of antineoplastic chemotherapy: Secondary | ICD-10-CM

## 2021-06-02 DIAGNOSIS — E785 Hyperlipidemia, unspecified: Secondary | ICD-10-CM

## 2021-06-02 DIAGNOSIS — Z9889 Other specified postprocedural states: Secondary | ICD-10-CM

## 2021-06-02 DIAGNOSIS — I10 Essential (primary) hypertension: Secondary | ICD-10-CM | POA: Diagnosis not present

## 2021-06-02 DIAGNOSIS — I2510 Atherosclerotic heart disease of native coronary artery without angina pectoris: Secondary | ICD-10-CM

## 2021-06-02 DIAGNOSIS — I714 Abdominal aortic aneurysm, without rupture, unspecified: Secondary | ICD-10-CM

## 2021-06-02 DIAGNOSIS — I739 Peripheral vascular disease, unspecified: Secondary | ICD-10-CM

## 2021-06-02 NOTE — Patient Instructions (Signed)
Medication Instructions:  Your Physician recommend you continue on your current medication as directed.    *If you need a refill on your cardiac medications before your next appointment, please call your pharmacy*   Lab Work: None ordered today   Testing/Procedures: None ordered today   Follow-Up: At [facility], you and your health needs are our priority.  As part of our continuing mission to provide you with exceptional heart care, we have created designated Provider Care Teams.  These Care Teams include your primary Cardiologist (physician) and Advanced Practice Providers (APPs -  Physician Assistants and Nurse Practitioners) who all work together to provide you with the care you need, when you need it.  We recommend signing up for the patient portal called "MyChart".  Sign up information is provided on this After Visit Summary.  MyChart is used to connect with patients for Virtual Visits (Telemedicine).  Patients are able to view lab/test results, encounter notes, upcoming appointments, etc.  Non-urgent messages can be sent to your provider as well.   To learn more about what you can do with MyChart, go to [email or web address].    Your next appointment:   November 30, 2021 @ 8:40 am  The format for your next appointment:   Virtual Visit   Provider:   Buford Dresser, MD

## 2021-06-02 NOTE — Progress Notes (Signed)
Virtual Visit via Video Note   This visit type was conducted due to national recommendations for restrictions regarding the COVID-19 Pandemic (e.g. social distancing) in an effort to limit this patient's exposure and mitigate transmission in our community.  Due to his co-morbid illnesses, this patient is at least at moderate risk for complications without adequate follow up.  This format is felt to be most appropriate for this patient at this time.  All issues noted in this document were discussed and addressed.  A limited physical exam was performed with this format.  Please refer to the patient's chart for his consent to telehealth for [facility].      The patient was identified using 2 identifiers.  Patient Location: Home Provider Location: Home Office   Date:  06/02/2021   ID:  Jeffrey [name], DOB 1941/06/01, MRN [id number]  PCP:  Vivi Barrack, MD  Cardiologist:  Buford Dresser, MD PhD  Referring MD: Vivi Barrack, MD   Chief complaint: follow up  History of Present Illness:    Jeffrey [name] is a 80 y.o. male with a hx of AAA s/p EVAR by Dr. Trula Slade, PAD, hypertension, coronary artery calcification who is seen for follow up today. I initially met him 02/02/18 as a new consult at the request of Dr. [name] for preoperative evaluation prior to vascular surgery.   Today: Overall stable to slowly improving. Stamina remains poor but gradually able to increase activity. Able to climb steps slowly. Tolerating PO, PEG tube removed.   Reviewed labs, CBC remains abnormal. No active bleeding. No claudication. Aspirin remains on hold.  Blood pressure remains near goal. Has not seen significantly high/low numbers in recent months.  Denies chest pain, shortness of breath at rest or with normal exertion. No PND, orthopnea, LE edema or unexpected weight gain. No syncope or palpitations.   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   Right Buccal Cheek([facility])   Osteopenia     Past Surgical History:  Procedure Laterality Date   ABDOMINAL AORTIC ENDOVASCULAR FENESTRATED STENT GRAFT N/A 03/21/2018   Procedure: ABDOMINAL AORTIC ENDOVASCULAR FENESTRATED STENT GRAFT ; [place] CT PERFORMED;  Surgeon: [name], MD;  Location[facility];  Service: Vascular;  Laterality: N/A;   Basal Skin Cancer  2012   CATARACT EXTRACTION Bilateral    IR GASTROSTOMY TUBE MOD SED  11/06/2020   IR GASTROSTOMY TUBE REMOVAL  04/19/2021   IR IMAGING GUIDED PORT INSERTION  11/06/2020   MASS BIOPSY Right 09/25/2020   Procedure: OPEN NECK MASS BIOPSY;  Surgeon: Leta Baptist, MD;  Location: [facility];  Service: ENT;  Laterality: Right;   MASS EXCISION     x 2; throat and above lip   TONSILLECTOMY     TRANSURETHRAL RESECTION OF PROSTATE      Current Medications: Current Outpatient Medications on File Prior to Visit  Medication Sig   amLODipine (NORVASC) 5 MG tablet Take 1 tablet (5 mg total) by mouth daily.   ARTIFICIAL TEAR SOLUTION OP Place 1 drop into both eyes daily as needed (dry eyes).   Ascorbic Acid (VITAMIN C) 1000 MG tablet Take 1,000 mg by mouth  daily.   atorvastatin (LIPITOR) 40 MG tablet Take 1 tablet (40 mg total) by mouth daily at 6 PM.   Cyanocobalamin 1000 MCG TBCR Take 1,000 mcg by mouth daily.   Emollient (CERAVE) LOTN Apply 1 application topically daily as needed (after shower care).    ferrous sulfate 325 (65 FE) MG tablet Take 325 mg by mouth every other day.   fluticasone (FLONASE) 50 MCG/ACT nasal spray Place 1 spray into both nostrils every other day.   GLUCOSAMINE-CHONDROITIN PO Take 1 tablet by mouth 2 (two) times daily.    losartan (COZAAR) 25 MG tablet TAKE 1 TABLET(25 MG)  BY MOUTH TWICE DAILY   No current facility-administered medications on file prior to visit.     Allergies:   Ketotifen fumarate, Mixed ragweed, and Triamcinolone   Social History   Tobacco Use   Smoking status: Former    Packs/day: 1.00    Years: 32.00    Pack years: 32.00    Types: Cigarettes    Quit date: 08/17/1990    Years since quitting: 30.8   Smokeless tobacco: Never  Vaping Use   Vaping Use: Never used  Substance Use Topics   Alcohol use: Not Currently    Comment: Occassionally, "when I feel like it"   Drug use: No    Family History: The patient's family history includes Alcohol abuse in his father; Colon cancer (age of onset: 81) in his paternal grandmother; Diabetes in his mother; Heart attack in his father; Heart disease in his father; Hyperlipidemia in his sister; Hypertension in his father, mother, and son; Pancreatic cancer in his brother and sister; Stroke in his mother. There is no history of Esophageal cancer, Stomach cancer, or Liver disease.  ROS:   Please see the history of present illness.  Additional pertinent ROS:  EKGs/Labs/Other Studies Reviewed:    The following studies were reviewed today: Echo 03/08/21  1. Left ventricular ejection fraction, by estimation, is 60 to 65%. The  left ventricle has normal function. The left ventricle has no regional  wall motion abnormalities. Left ventricular diastolic parameters were  normal.   2. Right ventricular systolic function is normal. The right ventricular  size is normal.   3. The mitral valve is normal in structure. Trivial mitral valve  regurgitation.   4. The aortic valve is normal in structure. Aortic valve regurgitation is  not visualized.  EKG:  EKG is personally reviewed today.   03/03/21 NSR at 72 bpm, nonspecific t wave pattern  Recent Labs: 03/02/2021: Magnesium 1.7 04/13/2021: TSH 1.75 05/11/2021: ALT 12; BUN 20; Creatinine, Ser 1.00; Hemoglobin 9.8; Platelets 91.0 Repeated and verified  X2.; Potassium 5.1; Sodium 129  Recent Lipid Panel    Component Value Date/Time   CHOL 126 04/13/2021 1419   CHOL 117 02/03/2020 1134   TRIG 53.0 04/13/2021 1419   HDL 71.50 04/13/2021 1419   HDL 60 02/03/2020 1134   CHOLHDL 2 04/13/2021 1419   VLDL 10.6 04/13/2021 1419   LDLCALC 44 04/13/2021 1419   LDLCALC 43 04/09/2020 1417    Physical Exam:    VS:  BP 130/70    Ht $R'5\' 9"'xb$  (1.753 m)    Wt 170 lb (77.1 kg)    BMI 25.10 kg/m     Wt Readings from Last 3 Encounters:  06/02/21 170 lb (77.1 kg)  05/28/21 170 lb 12.8 oz (77.5 kg)  04/13/21 176 lb 6.4 oz (80 kg)    VITAL SIGNS:  reviewed GEN:  no acute distress  EYES:  sclerae anicteric, EOMI - Extraocular Movements Intact RESPIRATORY:  normal respiratory effort, symmetric expansion CARDIOVASCULAR:  no visible JVD SKIN:  no rash, lesions or ulcers. MUSCULOSKELETAL:  no obvious deformities. NEURO:  alert and oriented x 3, no obvious focal deficit PSYCH:  normal affect   ASSESSMENT:    1. Hx antineoplastic chemotherapy   2. Essential hypertension   3. PVD (peripheral vascular disease) ([place])   4. Abdominal aortic aneurysm (AAA) without rupture, unspecified part   5. Coronary artery calcification seen on CT scan   6. History of stent insertion of renal artery   7. Hyperlipidemia, unspecified hyperlipidemia type     PLAN:    Squamous cell carcinoma of the oropharynx, s/p cisplatin and radiation -echo reassuring 02/2021 -no evidence of active disease on recent scans, PEG tube removed, port still in place  AAA s/p FEVAR and bilateral renal stents 2019: followed by Dr. Trula Slade -continue statin -aspirin on hold given cytopenia, counts remain abnormal but improving -no active bleeding -denies claudication  Hypertension:  -continue 25 mg losartan BID -continue amlodipine 5 mg daily  Coronary calcification on imaging Hyperlipidemia -LDL goal <70 give AAA, coronary calcification -tolerating high intensity statin,  atorvastatin 40 mg daily -aspirin as above  Cardiovascular risk evaluation and prevention counseling: -recommend heart healthy/Mediterranean diet, with whole grains, fruits, vegetable, fish, lean meats, nuts, and olive oil. Limit salt. -recommend moderate walking, 3-5 times/week for 30-50 minutes each session. Aim for at least 150 minutes.week. Goal should be pace of 3 miles/hours, or walking 1.5 miles in 30 minutes -recommend avoidance of tobacco products. Avoid excess alcohol.  Plan for follow up: 6 mos to follow up labs/discuss aspirin, sooner as needed  Today, I have spent 13 minutes with the patient with telehealth technology discussing the above problems.  Additional time spent in chart review, documentation, and communication.   Medication Adjustments/Labs and Tests Ordered: Current medicines are reviewed at length with the patient today.  Concerns regarding medicines are outlined above.  No orders of the defined types were placed in this encounter.   No orders of the defined types were placed in this encounter.   Patient Instructions  Medication Instructions:  Your Physician recommend you continue on your current medication as directed.    *If you need a refill on your cardiac medications before your next appointment, please call your pharmacy*   Lab Work: None ordered today   Testing/Procedures: None ordered today   Follow-Up: At [facility], you and your health needs are our priority.  As part of our continuing mission to provide you with exceptional heart care, we have created designated Provider Care Teams.  These Care Teams include your primary Cardiologist (physician) and Advanced Practice Providers (APPs -  Physician Assistants and Nurse Practitioners) who all work together to provide you with the care you need, when you need it.  We recommend signing up for the patient portal called "MyChart".  Sign up information is provided on this After Visit Summary.   MyChart is used to connect with patients for Virtual Visits (Telemedicine).  Patients are able to view lab/test results, encounter notes, upcoming appointments, etc.  Non-urgent messages can be sent to your provider as well.   To learn more about what you can do with MyChart, go to [email or web address].    Your next appointment:   November 30, 2021 @ 8:40 am  The format for your next appointment:   Virtual Visit   Provider:   Buford Dresser, MD  Signed, Buford Dresser, MD PhD 06/02/2021    [place]

## 2021-06-03 ENCOUNTER — Encounter: Payer: Self-pay | Admitting: Rehabilitation

## 2021-06-03 ENCOUNTER — Ambulatory Visit: Payer: Medicare Other | Admitting: Rehabilitation

## 2021-06-03 ENCOUNTER — Other Ambulatory Visit: Payer: Self-pay

## 2021-06-03 DIAGNOSIS — I890 Lymphedema, not elsewhere classified: Secondary | ICD-10-CM

## 2021-06-03 DIAGNOSIS — C109 Malignant neoplasm of oropharynx, unspecified: Secondary | ICD-10-CM

## 2021-06-03 DIAGNOSIS — R1310 Dysphagia, unspecified: Secondary | ICD-10-CM | POA: Diagnosis not present

## 2021-06-03 DIAGNOSIS — R293 Abnormal posture: Secondary | ICD-10-CM | POA: Diagnosis not present

## 2021-06-03 NOTE — Therapy (Addendum)
[place] @ [place], [place], [address] Phone: [phone]   Fax:  [phone]  Physical Therapy Treatment  Patient Details  Name: Jeffrey [name] MRN: [id number] Date of Birth: [birth year] Referring Provider (PT): Isidore Moos   Encounter Date: 06/03/2021   PT End of Session - 06/03/21 1314     Visit Number 6    Number of Visits 9    Date for PT Re-Evaluation 05/31/21    PT Start Time 5885    PT Stop Time 0277    PT Time Calculation (min) 45 min    Activity Tolerance Patient tolerated treatment well    Behavior During Therapy [facility] for tasks assessed/performed             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   Right Buccal Cheek(MO[facility])   Osteopenia     Past Surgical History:  Procedure Laterality Date   ABDOMINAL AORTIC ENDOVASCULAR FENESTRATED STENT GRAFT N/A 03/21/2018   Procedure: ABDOMINAL AORTIC ENDOVASCULAR FENESTRATED STENT GRAFT ; [place] CT PERFORMED;  Surgeon: [name], MD;  Location: [place];  Service: Vascular;  Laterality: N/A;   Basal Skin Cancer  2012   CATARACT EXTRACTION Bilateral    IR GASTROSTOMY TUBE MOD SED  11/06/2020   IR GASTROSTOMY TUBE REMOVAL  04/19/2021   IR IMAGING GUIDED PORT INSERTION  11/06/2020   MASS BIOPSY Right 09/25/2020   Procedure: OPEN NECK MASS BIOPSY;  Surgeon: Leta Baptist, MD;  Location: [place];  Service: ENT;  Laterality: Right;   MASS EXCISION     x 2; throat and above lip   TONSILLECTOMY     TRANSURETHRAL RESECTION OF PROSTATE      There were no vitals filed for this visit.   Subjective Assessment - 06/03/21 1233     Subjective I am doing well with the  self massage    Pertinent History Squamous cell carcinoma of oropharynx, Stage 1, 09/11/20 CT neck showed an enlarged right level 2 node. There was also noted to be asymmetric soft tissue at the right tongue base that was favored to reflect asymmetric tonsillar tissue, 10/05/20 excision of the right neck mass revealed  poorly differentiated squamous cell carcinoma. Tumor is p16 +  The nodule is mostly squamous cell carcinoma with a small  amount of lymphoid tissue at the periphery, presumably residual lymph node.  As the tumor has mostly replaced the presumed lymph node, there is almost certainly extra-nodal extension, the extent of which cannot be accurately measured due to the marked paucity of possible residual lymph node, 10/05/20 PET revealed primary mucosal neoplasm in the right tongue base area demonstrating hypermetabolism. The patient was status post excisional biopsy of the enlarged right level 2 lymph node and there were no other enlarged or hypermetabolic neck nodes. There were no PET-CT findings for metastatic disease involving the chest, abdomen, pelvis, or osseous structures, will receive 35 fractions of radiation to his Oropharynx and bilateral neck with weekly cisplatin. He started on 5/23 and will complete 12/29/20.    Currently in Pain? Yes    Pain Score 4     Pain  Location Back    Pain Orientation Upper    Pain Descriptors / Indicators Aching;Dull    Pain Type Chronic pain                               [facility] Adult PT Treatment/Exercise - 06/03/21 0001       Manual Therapy   Manual Lymphatic Drainage (MLD) In sitting had pt demonstrate entire sequence with therapist providing  verbal  cues as needed for sequence only: short neck, 5 diaphragmatic breaths, bilateral axillary nodes, bilateral pectoral nodes, bilateral chest, short neck, posterior, lateral and anterior neck then had pt lie down in supine and therapist performed MLD on anterior neck then retraced all  steps                          PT Long Term Goals - 05/03/21 1143       PT LONG TERM GOAL #1   Title Pt will demonstrate independence in self MLD for long term management of lymphedema.    Time 4    Period Weeks    Status New    Target Date 05/31/21      PT LONG TERM GOAL #2   Title Pt will receive appropriate compression garment for long term management of lymphedema.    Time 4    Period Weeks    Status New    Target Date 05/31/21      PT LONG TERM GOAL #3   Title Pt will demonstrate a 1.5 cm decrease in edema 8 cm superior to sternal notch to decrease risk of infection.    Baseline 48.4 cm    Time 4    Period Weeks    Status New    Target Date 05/31/21                   Plan - 06/03/21 1315     Clinical Impression Statement Pt doing very well and feels ready for DC next visit as scheduled.  Does well with MLD but with overthinking the steps and direction of circles but otherwise performing very well.    PT Frequency 2x / week    PT Duration 4 weeks    PT Treatment/Interventions ADLs/Self Care Home Management;Therapeutic exercise;Patient/family education;Manual lymph drainage;Compression bandaging;Manual techniques;Vasopneumatic Device    PT Next Visit Plan DC visit    Consulted and Agree with Plan of Care Patient             Patient will benefit from skilled therapeutic intervention in order to improve the following deficits and impairments:     Visit Diagnosis: Lymphedema, not elsewhere classified  Abnormal posture  Oropharyngeal carcinoma ([place])     Problem List Patient Active Problem List   Diagnosis Date Noted   Mucositis due to antineoplastic therapy 11/26/2020   Drug induced constipation 11/26/2020   Normocytic normochromic anemia 11/19/2020   Port-A-Cath in place 11/09/2020   History of drug allergy 11/09/2020   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[place]) 10/21/2020   Malignant neoplasm of base of tongue ([place])  10/21/2020   Hyperglycemia 04/09/2020   Chronic neck pain 04/09/2020   Coronary artery disease due to calcified coronary lesion 12/27/2018   Osteopenia 02/24/2017   Abdominal aortic aneurysm (AAA) without rupture 07/06/2015   Basal cell carcinoma 07/06/2015   Essential hypertension 07/06/2015   History of colon polyps 07/06/2015   HLD (hyperlipidemia) 07/06/2015  Stark Bray, PT 06/03/2021, 1:17 PM  [facility] @ [place], [place], [address] Phone: [phone]   Fax:  [phone]  Name: Jeffrey [name] MRN: [id number] Date of Birth: Oct 17, 1940  PHYSICAL THERAPY DISCHARGE SUMMARY  Visits from Start of Care: 6  Current functional level related to goals / functional outcomes: Per reading last note pt goals were mostly met and pt was independent in self MLD   Remaining deficits: None   Education / Equipment: Self MLD   Patient agrees to discharge. Patient goals were partially met. Patient is being discharged due to not returning since the last visit.  Allyson Sabal [place], [place] 08/25/21 11:51 AM

## 2021-06-08 ENCOUNTER — Other Ambulatory Visit: Payer: Self-pay

## 2021-06-08 ENCOUNTER — Encounter: Payer: Medicare Other | Admitting: Physical Therapy

## 2021-06-08 ENCOUNTER — Ambulatory Visit: Payer: Medicare Other

## 2021-06-08 ENCOUNTER — Other Ambulatory Visit: Payer: Medicare Other

## 2021-06-08 ENCOUNTER — Ambulatory Visit: Payer: Medicare Other | Admitting: Hematology and Oncology

## 2021-06-08 DIAGNOSIS — C109 Malignant neoplasm of oropharynx, unspecified: Secondary | ICD-10-CM | POA: Diagnosis not present

## 2021-06-08 DIAGNOSIS — R1310 Dysphagia, unspecified: Secondary | ICD-10-CM | POA: Diagnosis not present

## 2021-06-08 DIAGNOSIS — I890 Lymphedema, not elsewhere classified: Secondary | ICD-10-CM | POA: Diagnosis not present

## 2021-06-08 DIAGNOSIS — R293 Abnormal posture: Secondary | ICD-10-CM | POA: Diagnosis not present

## 2021-06-10 NOTE — Therapy (Signed)
[facility] [address] [place], [place], [address] Phone: [phone]   Fax:  [phone]  Speech Language Pathology Treatment  Patient Details  Name: Jeffrey [name] MRN: [id number] Date of Birth: 1940-08-05 Referring Provider (SLP): Eppie Gibson, MD   Encounter Date: 06/08/2021   End of Session - 06/10/21 0105     Visit Number 6    Number of Visits 6    Date for SLP Re-Evaluation 06/15/21    SLP Start Time 2376    SLP Stop Time  34    SLP Time Calculation (min) 35 min    Activity Tolerance Patient tolerated treatment well             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   Right Buccal Cheek(MOHS)   Osteopenia     Past Surgical History:  Procedure Laterality Date   ABDOMINAL AORTIC ENDOVASCULAR FENESTRATED STENT GRAFT N/A 03/21/2018   Procedure: ABDOMINAL AORTIC ENDOVASCULAR FENESTRATED STENT GRAFT ; [place] CT PERFORMED;  Surgeon: [name], MD;  Location: [place];  Service: Vascular;  Laterality: N/A;   Basal Skin Cancer  2012   CATARACT EXTRACTION Bilateral    IR GASTROSTOMY TUBE MOD SED  11/06/2020   IR GASTROSTOMY TUBE REMOVAL  04/19/2021   IR IMAGING GUIDED PORT INSERTION  11/06/2020   MASS BIOPSY Right 09/25/2020   Procedure: OPEN NECK MASS BIOPSY;  Surgeon: Leta Baptist, MD;  Location: [place];  Service: ENT;  Laterality: Right;   MASS EXCISION     x 2; throat and above lip   TONSILLECTOMY     TRANSURETHRAL RESECTION OF PROSTATE      There were no vitals filed for this visit.   SPEECH THERAPY DISCHARGE SUMMARY  Visits from Start of Care: 6  Current functional level related to goals / functional  outcomes: See below.   Remaining deficits: See below   Education / Equipment: HEP , late effects head/neck radiation on swallow function, food journal, making HEP more difficult.   Patient agrees to discharge. Patient goals were partially met. Patient is being discharged due to being pleased with the current functional level..          ADULT SLP TREATMENT - 06/10/21 0001       General Information   Behavior/Cognition Alert;Cooperative;Pleasant mood      Treatment Provided   Treatment provided Dysphagia      Dysphagia Treatment   Respiratory Status Room air    Treatment Methods Skilled observation;Therapeutic exercise;Patient/caregiver education    Patient observed directly with PO's Yes    Type of PO's observed Dysphagia 3 (soft);Thin liquids    Oral Phase Signs & Symptoms Prolonged mastication    Pharyngeal Phase Signs & Symptoms Other (comment)   none noted   Other treatment/comments Completed HEP with SBA. reported completion each day since last visit - maybe skipped 1-2 days. Pt told SLP how to make HEP more challenging indpendently, and when to decr frequency to at least 3 days/week (mid-January). SLP told pt to contact rad onc, med onc, or ENT if he should experience difficulty with his swallowing including coughing,  choking, inability to pass food through the pharynx, or excessive throat clearing. Lastly SLP let pt know to get to ED or urgent care should he experience overt s/sx of aspiration PNA.      Assessment / Recommendations / Plan   Plan Discharge SLP treatment due to (comment)   reached potential/met goals     Dysphagia Recommendations   Medication Administration Whole meds with liquid      Progression Toward Goals   Progression toward goals Goals met, education completed, patient discharged from SLP              SLP Education - 06/10/21 0101     Education Details where to go/who to tell withswallow problems    Person(s) Educated Patient     Methods Explanation    Comprehension Verbalized understanding              SLP SHORT TERM GOAL #1      Title pt will tell SLP why pt is completing HEP with modified independence    Time      Period --   visits, for all STGs    Status Not met (SLP provided total A)           SLP SHORT TERM GOAL #2    Title pt will complete HEP with rare min A in 2 sessions    Time      Status Not met           SLP SHORT TERM GOAL #3    Title pt will describe 3 overt s/s aspiration PNA with modified independence    Time      Status Met           SLP SHORT TERM GOAL #4    Title pt will tell SLP how a food journal could hasten return to a more normalized diet    Time      Status Deferred                                       SLP LONG TERM GOAL #1    Title pt will complete HEP with modified independence over three visits    Time      Period --   visits, for all LTGs    Status Partially met           SLP LONG TERM GOAL #2    Title pt will complete HEP with independence over two visits    Time     Status Partially Met           SLP LONG TERM GOAL #3    Title pt will describe how to modify HEP over time, and the timeline associated with reduction in HEP frequency with modified independence over two sessions    Time     Status Achieved                  Plan - 04/16/21 1404       Clinical Impression Statement At this time [name]'s swallowing is deemed WNL/WFL with regular/dys III diet (cereal bar) and thin liquids. SLP reviewed his individualized HEP for dysphagia and pt completed each exercise on his own with modified independence. There are no overt s/s aspiration PNA reported by pt nor observed at this time. Data indicate that pt's swallow ability will likely decrease over the course of radiation therapy and  could very well decline over time following conclusion of their radiation therapy due to muscle disuse atrophy and/or muscle fibrosis. Pt will cont to need to be seen by SLP  in order to assess safety of PO intake, assess the need for recommending any objective swallow assessment, and ensuring pt correctly completes the individualized HEP. REduced to once everyother month. D/c this session - pt agrees.               Treatment/Interventions Aspiration precaution training;Pharyngeal strengthening exercises;Diet toleration management by SLP;Trials of upgraded texture/liquids;Patient/family education;SLP instruction and feedback;Internal/external aids     Potential to Achieve Goals Good     SLP Home Exercise Plan provided today     Consulted and Agree with Plan of Care Patient           Patient will benefit from skilled therapeutic intervention in order to improve the following deficits and impairments:   No diagnosis found.    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, [place], [place], [address] Phone: [phone]   Fax:  [phone]   Name: Jeffrey [name] MRN: [id number] Date of Birth: 08-May-1941

## 2021-06-11 ENCOUNTER — Telehealth: Payer: Self-pay | Admitting: Hematology and Oncology

## 2021-06-11 NOTE — Telephone Encounter (Signed)
Scheduled appointment per provider. Patient aware.  

## 2021-06-23 ENCOUNTER — Telehealth: Payer: Self-pay | Admitting: Gastroenterology

## 2021-06-23 NOTE — Telephone Encounter (Signed)
Colonoscopy March 2017 Dr. Derinda Sis in [place].  indication "personal history of colon polyps" findings 2 diminutive polyps were found and removed, one was 3 mm, 1 was 2 mm.  1 of these was a tubular adenoma on pathology and the other one was in.  He was also noted to have "severe diverticulosis" of the sigmoid colon and also internal hemorrhoids.   Jeffrey [name], Can you please contact him.  Let him know that I reviewed his March 2017 colonoscopy as well as the biopsy reports.  He had only a single subcentimeter adenoma.  He does not need surveillance colonoscopy until March 2024.  At that time he will be 20/36 82 years old and so it is probably not going to be necessary that he have 1 at that time.

## 2021-06-24 NOTE — Telephone Encounter (Signed)
Phone call to patient to make him aware that Dr Ardis Hughs has reviewed colonoscopy findingsfrom March 2017.  Patient advised that he does not need surveillance colonoscopy until March 2024, and he will be 39+ years old at that time and it will probably not be necessary to have a colonoscopy at that time.    Patient advised to contact our office with any further complications or issues that he may have, and follow up on an as needed basis.    Patient agreed to plan and verbalized understanding. No further questions.

## 2021-06-29 ENCOUNTER — Other Ambulatory Visit: Payer: Self-pay

## 2021-06-29 ENCOUNTER — Ambulatory Visit (HOSPITAL_COMMUNITY)
Admission: RE | Admit: 2021-06-29 | Discharge: 2021-06-29 | Disposition: A | Discharge status: Home / Self Care | Payer: Medicare Other | Source: Ambulatory Visit | Attending: Radiation Oncology | Admitting: Radiation Oncology

## 2021-06-29 DIAGNOSIS — C01 Malignant neoplasm of base of tongue: Secondary | ICD-10-CM | POA: Diagnosis not present

## 2021-06-29 DIAGNOSIS — J9811 Atelectasis: Secondary | ICD-10-CM | POA: Diagnosis not present

## 2021-07-01 NOTE — Progress Notes (Incomplete)
Jeffrey [name] presents today for follow-up after completing radiation to his oropharynx on 12/29/2020, and to review CT scan results from 06/29/2021  Pain issues, if any: *** Using a feeding tube?: N/A--removed 04/19/2021 Weight changes, if any: ***  Swallowing issues, if any: *** Smoking or chewing tobacco? *** Using fluoride trays daily? *** Last ENT visit was on: ***Not since diagnosis Other notable issues, if any: ***Scheduled for F/U with Dr. Chryl Heck on 07/07/2021

## 2021-07-02 ENCOUNTER — Encounter: Payer: Self-pay | Admitting: Hematology and Oncology

## 2021-07-02 ENCOUNTER — Ambulatory Visit: Payer: Medicare Other | Admitting: Radiation Oncology

## 2021-07-05 ENCOUNTER — Ambulatory Visit: Payer: Medicare Other | Admitting: Surgery

## 2021-07-05 ENCOUNTER — Ambulatory Visit (HOSPITAL_COMMUNITY)
Admission: RE | Admit: 2021-07-05 | Discharge: 2021-07-05 | Disposition: A | Discharge status: Home / Self Care | Payer: Medicare Other | Source: Ambulatory Visit | Attending: Surgery | Admitting: Surgery

## 2021-07-05 ENCOUNTER — Ambulatory Visit (INDEPENDENT_AMBULATORY_CARE_PROVIDER_SITE_OTHER)
Admission: RE | Admit: 2021-07-05 | Discharge: 2021-07-05 | Disposition: A | Discharge status: Home / Self Care | Payer: Medicare Other | Source: Ambulatory Visit | Attending: Surgery | Admitting: Surgery

## 2021-07-05 ENCOUNTER — Other Ambulatory Visit: Payer: Self-pay

## 2021-07-05 ENCOUNTER — Encounter: Payer: Self-pay | Admitting: Surgery

## 2021-07-05 VITALS — BP 149/78 | HR 60 | Temp 98.0°F | Resp 20 | Ht 69.0 in | Wt 169.0 lb

## 2021-07-05 DIAGNOSIS — I7142 Juxtarenal abdominal aortic aneurysm, without rupture: Secondary | ICD-10-CM

## 2021-07-05 DIAGNOSIS — I714 Abdominal aortic aneurysm, without rupture, unspecified: Secondary | ICD-10-CM | POA: Insufficient documentation

## 2021-07-05 DIAGNOSIS — I739 Peripheral vascular disease, unspecified: Secondary | ICD-10-CM

## 2021-07-05 NOTE — Progress Notes (Signed)
[facility]  Patient name: Jeffrey [name] MRN: [id number] DOB: 08-30-1940 Sex: male   REASON FOR VISIT:    Follow-up  HISOTRY OF PRESENT ILLNESS:    Jeffrey [name] is a 81 y.o. male who returns today for follow up  He is status post FEVAR with a scalloped to the superior mesenteric artery and bilateral renal stents on March 21, 2018.  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  He did have some left foot numbness following the procedure which improved with time.  He had a relatively silent occlusion of his left limb of his graft.  He rarely gets left leg symptoms.     He continues to take a statin for hypercholesterolemia.  He is on an ARB for hypertension.  He does take an aspirin.  He is a former smoker.  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   Right Buccal Cheek(MOHS)   Osteopenia      FAMILY HISTORY:   Family History  Problem Relation Age of Onset   Diabetes Mother    Hypertension Mother    Stroke Mother    Alcohol abuse Father    Heart attack Father    Heart disease Father    Hypertension Father    Hyperlipidemia Sister    Pancreatic cancer Sister    Pancreatic cancer Brother    Colon cancer Paternal Grandmother 83   Hypertension Son    Esophageal cancer Neg Hx    Stomach cancer Neg Hx    Liver disease Neg Hx     SOCIAL HISTORY:   Social History   Tobacco Use   Smoking status: Former    Packs/day: 1.00    Years: 32.00    Pack years: 32.00    Types: Cigarettes    Quit date: 08/17/1990    Years since quitting: 30.9    Smokeless tobacco: Never  Substance Use Topics   Alcohol use: Not Currently    Comment: Occassionally, "when I feel like it"     ALLERGIES:   Allergies  Allergen Reactions   Ketotifen Fumarate Swelling and Other (See Comments)    Eye swelling   Mixed Ragweed     Hay fever   Triamcinolone Hives and Rash     CURRENT MEDICATIONS:   Current Outpatient Medications  Medication Sig Dispense Refill   amLODipine (NORVASC) 5 MG tablet Take 1 tablet (5 mg total) by mouth daily. 90 tablet 3   ARTIFICIAL TEAR SOLUTION OP Place 1 drop into both eyes daily as needed (dry eyes).     Ascorbic Acid (VITAMIN C) 1000 MG tablet Take 1,000 mg by mouth daily.     atorvastatin (LIPITOR) 40 MG tablet Take 1 tablet (40 mg total) by mouth daily at 6 PM. 90 tablet 3   Cyanocobalamin 1000 MCG TBCR Take 1,000 mcg by mouth daily.     Emollient (CERAVE) LOTN Apply 1 application topically daily as needed (after shower care).      ferrous sulfate 325 (65 FE) MG tablet Take 325  mg by mouth every other day.     fluticasone (FLONASE) 50 MCG/ACT nasal spray Place 1 spray into both nostrils every other day.     GLUCOSAMINE-CHONDROITIN PO Take 1 tablet by mouth 2 (two) times daily.      losartan (COZAAR) 25 MG tablet TAKE 1 TABLET(25 MG) BY MOUTH TWICE DAILY 180 tablet 3   No current facility-administered medications for this visit.    REVIEW OF SYSTEMS:   [X]  denotes positive finding, [ ]  denotes negative finding Cardiac  Comments:  Chest pain or chest pressure:    Shortness of breath upon exertion:    Short of breath when lying flat:    Irregular heart rhythm:        Vascular    Pain in calf, thigh, or hip brought on by ambulation:    Pain in feet at night that wakes you up from your sleep:     Blood clot in your veins:    Leg swelling:         Pulmonary    Oxygen at home:    Productive cough:     Wheezing:         Neurologic    Sudden weakness in arms or legs:     Sudden numbness in arms or  legs:     Sudden onset of difficulty speaking or slurred speech:    Temporary loss of vision in one eye:     Problems with dizziness:         Gastrointestinal    Blood in stool:     Vomited blood:         Genitourinary    Burning when urinating:     Blood in urine:        Psychiatric    Major depression:         Hematologic    Bleeding problems:    Problems with blood clotting too easily:        Skin    Rashes or ulcers:        Constitutional    Fever or chills:      PHYSICAL EXAM:   There were no vitals filed for this visit.  GENERAL: The patient is a well-nourished male, in no acute distress. The vital signs are documented above. CARDIAC: There is a regular rate and rhythm.  PULMONARY: Non-labored respirations ABDOMEN: Soft and non-tender  MUSCULOSKELETAL: There are no major deformities or cyanosis. NEUROLOGIC: No focal weakness or paresthesias are detected. SKIN: There are no ulcers or rashes noted. PSYCHIATRIC: The patient has a normal affect.  STUDIES:   I have reviewed the following vascular lab studies: +-------+-----------+-----------+------------+------------+   ABI/TBI Today's ABI Today's TBI Previous ABI Previous TBI   +-------+-----------+-----------+------------+------------+   Right   1.18        0.99        1.12         0.71           +-------+-----------+-----------+------------+------------+   Left    0.73        0.38        0.78         0.29           +-------+-----------+-----------+------------+------------+  Right toe pressure equals 146 Left toe pressure equals 56  EVAR: Abdominal Aorta: Patent endovascular aneurysm repair with no evidence of  endoleak. Occlusion of the left limb of the EVAR.  Maximum aortic diameter is 3.53 cm  MEDICAL ISSUES:   AAA:  Maximum aortic diameter is 3.53 cm.  No evidence of endoleak.  He will follow-up with ultrasound of the aneurysm as well as his renal arteries in 1 year  Left leg claudication: He had a  relatively silent occlusion of the left limb of his graft.  His daily living is not impacted by this at all.  I did discuss that if he gets a wound or change in symptoms to contact me.  I will check ABIs again at his next visit.    Leia Alf, MD, FACS [facility] [phone] Pager [phone]

## 2021-07-06 ENCOUNTER — Other Ambulatory Visit: Payer: Medicare Other

## 2021-07-06 ENCOUNTER — Ambulatory Visit: Payer: Medicare Other | Admitting: Hematology and Oncology

## 2021-07-07 ENCOUNTER — Other Ambulatory Visit: Payer: Self-pay

## 2021-07-07 ENCOUNTER — Encounter: Payer: Self-pay | Admitting: Hematology and Oncology

## 2021-07-07 ENCOUNTER — Inpatient Hospital Stay: Payer: Medicare Other | Attending: Hematology and Oncology

## 2021-07-07 ENCOUNTER — Inpatient Hospital Stay: Payer: Medicare Other | Admitting: Hematology and Oncology

## 2021-07-07 VITALS — BP 146/73 | HR 63 | Temp 97.9°F | Resp 18 | Ht 69.0 in | Wt 171.9 lb

## 2021-07-07 DIAGNOSIS — C109 Malignant neoplasm of oropharynx, unspecified: Secondary | ICD-10-CM

## 2021-07-07 DIAGNOSIS — Z931 Gastrostomy status: Secondary | ICD-10-CM | POA: Insufficient documentation

## 2021-07-07 DIAGNOSIS — Z800 Family history of malignant neoplasm of digestive organs: Secondary | ICD-10-CM | POA: Insufficient documentation

## 2021-07-07 DIAGNOSIS — D61810 Antineoplastic chemotherapy induced pancytopenia: Secondary | ICD-10-CM | POA: Diagnosis not present

## 2021-07-07 DIAGNOSIS — T451X5D Adverse effect of antineoplastic and immunosuppressive drugs, subsequent encounter: Secondary | ICD-10-CM | POA: Insufficient documentation

## 2021-07-07 DIAGNOSIS — Z9221 Personal history of antineoplastic chemotherapy: Secondary | ICD-10-CM | POA: Diagnosis not present

## 2021-07-07 DIAGNOSIS — Z87891 Personal history of nicotine dependence: Secondary | ICD-10-CM | POA: Diagnosis not present

## 2021-07-07 DIAGNOSIS — D6481 Anemia due to antineoplastic chemotherapy: Secondary | ICD-10-CM | POA: Diagnosis not present

## 2021-07-07 DIAGNOSIS — Z923 Personal history of irradiation: Secondary | ICD-10-CM | POA: Insufficient documentation

## 2021-07-07 LAB — CBC WITH DIFFERENTIAL (CANCER CENTER ONLY)
Abs Immature Granulocytes: 0.05 10*3/uL (ref 0.00–0.07)
Basophils Absolute: 0 10*3/uL (ref 0.0–0.1)
Basophils Relative: 0 %
Eosinophils Absolute: 0 10*3/uL (ref 0.0–0.5)
Eosinophils Relative: 0 %
HCT: 27.6 % — ABNORMAL LOW (ref 39.0–52.0)
Hemoglobin: 9.6 g/dL — ABNORMAL LOW (ref 13.0–17.0)
Immature Granulocytes: 1 %
Lymphocytes Relative: 11 %
Lymphs Abs: 0.6 10*3/uL — ABNORMAL LOW (ref 0.7–4.0)
MCH: 33.4 pg (ref 26.0–34.0)
MCHC: 34.8 g/dL (ref 30.0–36.0)
MCV: 96.2 fL (ref 80.0–100.0)
Monocytes Absolute: 1.1 10*3/uL — ABNORMAL HIGH (ref 0.1–1.0)
Monocytes Relative: 22 %
Neutro Abs: 3.5 10*3/uL (ref 1.7–7.7)
Neutrophils Relative %: 66 %
Platelet Count: 98 10*3/uL — ABNORMAL LOW (ref 150–400)
RBC: 2.87 MIL/uL — ABNORMAL LOW (ref 4.22–5.81)
RDW: 13.8 % (ref 11.5–15.5)
WBC Count: 5.2 10*3/uL (ref 4.0–10.5)
nRBC: 0 % (ref 0.0–0.2)

## 2021-07-07 LAB — BASIC METABOLIC PANEL - CANCER CENTER ONLY
Anion gap: 8 (ref 5–15)
BUN: 28 mg/dL — ABNORMAL HIGH (ref 8–23)
CO2: 25 mmol/L (ref 22–32)
Calcium: 9.9 mg/dL (ref 8.9–10.3)
Chloride: 97 mmol/L — ABNORMAL LOW (ref 98–111)
Creatinine: 1.12 mg/dL (ref 0.61–1.24)
GFR, Estimated: >60 mL/min (ref >60)
Glucose, Bld: 89 mg/dL (ref 70–99)
Potassium: 4.3 mmol/L (ref 3.5–5.1)
Sodium: 130 mmol/L — ABNORMAL LOW (ref 135–145)

## 2021-07-07 LAB — MAGNESIUM: Magnesium: 1.7 mg/dL (ref 1.7–2.4)

## 2021-07-07 NOTE — Progress Notes (Signed)
Jeffrey [name]   Telephone:([phone] Fax:([phone]   Clinic Follow up Note   Patient Care Team: Vivi Barrack, MD as PCP - General (Family Medicine) Buford Dresser, MD as PCP - Cardiology (Cardiology) Chevis Pretty as Consulting Physician (Dentistry) [name], MD as Consulting Physician (Vascular Surgery) Warren Danes, PA-C as Physician Assistant (Dermatology) Malmfelt, Stephani Police, RN as Oncology Nurse Navigator Eppie Gibson, MD as Consulting Physician (Radiation Oncology) Leta Baptist, MD as Consulting Physician (Otolaryngology) Benay Pike, MD as Consulting Physician (Hematology and Oncology)  Date of Service:  07/07/2021  CHIEF COMPLAINT: f/u of oropharyngeal cancer after completion of chemoradiation.  SUMMARY OF ONCOLOGIC HISTORY: Oncology History Overview Note  Patient first noticed to have neck mass about 2 months prior to presentation. He denies any complaints except for neck mass, no difficulty swallowing, pain at the base of the tongue. He went to see his PCP who recommended an [place] which demonstrated hypoechoic mass at the right level II a highly concerning for an enlarged pathologic LN. He was referred to ENT and CT neck was ordered. CT neck showed enlarged right level 2 node, asymmetric soft tissue at the right tongue base, favored to reflect asymmetric tonsillar issue. 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 PET CT showed primary mucosal neoplasm in the right tongue base area demonstrating hypermetabolism.    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   INTERVAL HISTORY:   Jeffrey [name] is here for a follow up of oropharyngeal cancer. He completed radiation on 12/29/2020.  He had 4 cycles of chemo, dose reduced, received a total dose of 100 mg per metered square. He couldn't tolerate any further chemotherapy Jeffrey [name] is here for follow-up after finishing chemoradiation.   End of treatment PET with complete response.  Since his last visit, he continues to do well.  He has some chronic dry mouth and loss of taste but has been able to manage weight.  He would like to maintain his weight around 165-170 pounds, weight before starting treatment was 190 pounds. He denies any change in breathing, bowel habits or urinary habits.  He has follow-up with ENT team next week.  He would like to get his port removed. 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   Right Buccal Cheek(MOHS)   Osteopenia     SURGICAL HISTORY: Past Surgical History:  Procedure Laterality Date   ABDOMINAL AORTIC ENDOVASCULAR FENESTRATED STENT GRAFT N/A 03/21/2018   Procedure: ABDOMINAL AORTIC ENDOVASCULAR FENESTRATED STENT GRAFT ; [facility] CT PERFORMED;  Surgeon: [name], MD;  Location: [facility] OR;  Service: Vascular;  Laterality: N/A;   Basal Skin Cancer  2012   CATARACT EXTRACTION Bilateral    IR GASTROSTOMY TUBE MOD SED  11/06/2020   IR GASTROSTOMY TUBE REMOVAL  04/19/2021   IR IMAGING GUIDED PORT INSERTION  11/06/2020   MASS BIOPSY Right 09/25/2020    Procedure: OPEN NECK MASS BIOPSY;  Surgeon: Leta Baptist, MD;  Location: [place];  Service: ENT;  Laterality: Right;   MASS EXCISION     x 2; throat and above lip   TONSILLECTOMY     TRANSURETHRAL RESECTION OF PROSTATE      ALLERGIES:  is allergic to ketotifen fumarate, mixed ragweed, and triamcinolone.  MEDICATIONS:  Current Outpatient Medications  Medication Sig Dispense Refill   amLODipine (NORVASC) 5 MG tablet Take 1 tablet (5 mg total) by mouth daily. 90 tablet 3   ARTIFICIAL TEAR SOLUTION OP Place 1 drop into both eyes daily as needed (dry eyes).     Ascorbic Acid (VITAMIN C) 1000 MG tablet Take 1,000 mg by mouth daily.     atorvastatin (LIPITOR) 40 MG tablet Take 1 tablet (40 mg total) by mouth daily at 6 PM. 90 tablet 3   Cyanocobalamin 1000 MCG TBCR Take 1,000 mcg by mouth daily.     Emollient (CERAVE) LOTN Apply 1 application topically daily as needed (after shower care).      ferrous sulfate 325 (65 FE) MG tablet Take 325 mg by mouth every other day.     fluticasone (FLONASE) 50 MCG/ACT nasal spray Place 1 spray into both nostrils every other day.     GLUCOSAMINE-CHONDROITIN PO Take 1 tablet by mouth 2 (two) times daily.      losartan (COZAAR) 25 MG tablet TAKE 1 TABLET(25 MG) BY MOUTH TWICE DAILY 180 tablet 3   No current facility-administered medications for this visit.    PHYSICAL EXAMINATION: ECOG PERFORMANCE STATUS: 2 - Symptomatic, <50% confined to bed  Vitals:   07/07/21 1156  BP: (!) 146/73  Pulse: 63  Resp: 18  Temp: 97.9 F (36.6 C)  SpO2: 100%    GENERAL:alert, appears well, in no distress. SKIN: skin color, texture, turgor are normal, no rashes or significant lesions EYES: normal, Conjunctiva are pink and non-injected, sclera clear OROPHARYNX: No tonsillar mass  NECK: No palpable bilateral cervical lymphadenopathy. Abdomen: Soft, nontender, nondistended, no organomegaly.   Musculoskeletal:no cyanosis of digits and no clubbing   NEURO: alert & oriented x 3 with fluent speech, no focal motor/sensory deficits  LABORATORY DATA:  I have reviewed the data as listed CBC Latest Ref Rng & Units 07/07/2021 05/11/2021 04/13/2021  WBC 4.0 - 10.5 K/uL 5.2 5.3 5.2  Hemoglobin 13.0 - 17.0 g/dL 9.6(L) 9.8(L) 10.5(L)  Hematocrit 39.0 - 52.0 % 27.6(L) 28.5(L) 30.7(L)  Platelets 150 - 400 K/uL 98(L) 91.0 Repeated and verified X2.(L) 88.0(L)     CMP Latest Ref Rng & Units 07/07/2021 05/11/2021 04/13/2021  Glucose 70 - 99 mg/dL 89 94 92  BUN 8 - 23 mg/dL 28(H) 20 23  Creatinine 0.61 - 1.24 mg/dL 1.12 1.00 1.03  Sodium 135 - 145 mmol/L 130(L) 129(L) 128(L)  Potassium 3.5 - 5.1 mmol/L 4.3 5.1 4.8  Chloride 98 - 111 mmol/L 97(L) 95(L) 95(L)  CO2 22 - 32 mmol/L 25 24 22   Calcium 8.9 - 10.3 mg/dL 9.9 9.8 10.0  Total Protein 6.0 - 8.3 g/dL - 7.2 7.5  Total Bilirubin 0.2 - 1.2 mg/dL -  0.9 0.8  Alkaline Phos 39 - 117 U/L - 56 62  AST 0 - 37 U/L - 22 20  ALT 0 - 53 U/L - 12 12    RADIOGRAPHIC STUDIES: I have personally reviewed the radiological images as listed and agreed with the findings in the report. No results found.   ASSESSMENT & PLAN:   Yobani Schertzer is a 81 y.o. 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 -Initial biopsy was inconclusive so he had an excisional biopsy which showed P16 positive right neck poorly differentiated SCC. -PET CT showed primary mucosal neoplasm in the right tongue base area demonstrating hypermetabolism. -He was started on concurrent chemoradiation therapy with cisplatin on 11/09/20. -He developed worsening thrombocytopenia, anemia and leukopenia following cycle 2. Chemo has been held since 11/19/20. -He overall got 100 max per metered square of cisplatin, multiple delays due to cytopenias in the last 2 cycles, he only got 20 mg  per metered square.   End of treatment PET with no concern for residual disease.  He is here for follow-up.  Clinically no evidence of cancer recurrence. He was recommended to follow-up with ENT for surveillance.    2. Chemotherapy-induced cytopenia His blood counts show significant improvement compared to last visit, however he continues to have anemia and thrombocytopenia.  No indication for transfusion.  I have added additional labs when he comes back to follow-up with me in 4 to 6 weeks.  #3.  Weight loss, resolved, he has been able to maintain his weight around 170 pounds.  He actually would want to lose about 5 pounds, he does not want to go back to his baseline weight.  #4, TSH in October 2022 is normal, repeat recommended in 1 year.  Okay to remove port  Plan:  -Completed chemotherapy and radiation, not very well-tolerated. Return to clinic in 4 to 6 weeks for lab check and follow up    Benay Pike, MD 07/07/2021

## 2021-07-12 DIAGNOSIS — Z85819 Personal history of malignant neoplasm of unspecified site of lip, oral cavity, and pharynx: Secondary | ICD-10-CM | POA: Diagnosis not present

## 2021-07-14 ENCOUNTER — Other Ambulatory Visit: Payer: Self-pay | Admitting: Student

## 2021-07-15 ENCOUNTER — Ambulatory Visit (HOSPITAL_COMMUNITY)
Admission: RE | Admit: 2021-07-15 | Discharge: 2021-07-15 | Disposition: A | Discharge status: Home / Self Care | Payer: Medicare Other | Source: Ambulatory Visit | Attending: Hematology and Oncology | Admitting: Hematology and Oncology

## 2021-07-15 ENCOUNTER — Other Ambulatory Visit: Payer: Self-pay

## 2021-07-15 DIAGNOSIS — Z85818 Personal history of malignant neoplasm of other sites of lip, oral cavity, and pharynx: Secondary | ICD-10-CM | POA: Diagnosis not present

## 2021-07-15 DIAGNOSIS — Z452 Encounter for adjustment and management of vascular access device: Secondary | ICD-10-CM | POA: Insufficient documentation

## 2021-07-15 DIAGNOSIS — C109 Malignant neoplasm of oropharynx, unspecified: Secondary | ICD-10-CM | POA: Insufficient documentation

## 2021-07-15 HISTORY — PX: IR REMOVAL TUN ACCESS W/ PORT W/O FL MOD SED: IMG2290

## 2021-07-15 MED ORDER — LIDOCAINE-EPINEPHRINE 1 %-1:100000 IJ SOLN
INTRAMUSCULAR | Status: AC
  Filled 2021-07-15: qty 1

## 2021-08-11 ENCOUNTER — Telehealth: Payer: Self-pay | Admitting: Family Medicine

## 2021-08-11 NOTE — Chronic Care Management (AMB) (Signed)
°  Chronic Care Management   Note  08/11/2021 Name: Jeffrey [name] MRN: [id number] DOB: 16-Jan-1941  Jeffrey [name] is a 81 y.o. year old male who is a primary care patient of Vivi Barrack, MD. I reached out to [employer] by phone today in response to a referral sent by [name] PCP, Vivi Barrack, MD.   [name] was given information about Chronic Care Management services today including:  CCM service includes personalized support from designated clinical staff supervised by his physician, including individualized plan of care and coordination with other care providers 24/7 contact phone numbers for assistance for urgent and routine care needs. Service will only be billed when office clinical staff spend 20 minutes or more in a month to coordinate care. Only one practitioner may furnish and bill the service in a calendar month. The patient may stop CCM services at any time (effective at the end of the month) by phone call to the office staff.   Patient agreed to services and verbal consent obtained.   Follow up [place] 09/03/2021@9 :30AM   Tatjana Dellinger Upstream Scheduler

## 2021-08-19 ENCOUNTER — Inpatient Hospital Stay: Payer: Medicare Other | Admitting: Hematology and Oncology

## 2021-08-19 ENCOUNTER — Encounter: Payer: Self-pay | Admitting: Hematology and Oncology

## 2021-08-19 ENCOUNTER — Inpatient Hospital Stay: Payer: Medicare Other | Attending: Hematology and Oncology

## 2021-08-19 ENCOUNTER — Other Ambulatory Visit: Payer: Self-pay

## 2021-08-19 VITALS — BP 145/82 | HR 63 | Temp 97.9°F | Resp 16 | Ht 69.0 in | Wt 166.7 lb

## 2021-08-19 DIAGNOSIS — T451X5D Adverse effect of antineoplastic and immunosuppressive drugs, subsequent encounter: Secondary | ICD-10-CM | POA: Diagnosis not present

## 2021-08-19 DIAGNOSIS — Z923 Personal history of irradiation: Secondary | ICD-10-CM | POA: Insufficient documentation

## 2021-08-19 DIAGNOSIS — Z800 Family history of malignant neoplasm of digestive organs: Secondary | ICD-10-CM | POA: Diagnosis not present

## 2021-08-19 DIAGNOSIS — D61810 Antineoplastic chemotherapy induced pancytopenia: Secondary | ICD-10-CM | POA: Diagnosis not present

## 2021-08-19 DIAGNOSIS — C109 Malignant neoplasm of oropharynx, unspecified: Secondary | ICD-10-CM | POA: Insufficient documentation

## 2021-08-19 DIAGNOSIS — D649 Anemia, unspecified: Secondary | ICD-10-CM | POA: Diagnosis not present

## 2021-08-19 DIAGNOSIS — Z87891 Personal history of nicotine dependence: Secondary | ICD-10-CM | POA: Insufficient documentation

## 2021-08-19 DIAGNOSIS — Z931 Gastrostomy status: Secondary | ICD-10-CM | POA: Insufficient documentation

## 2021-08-19 DIAGNOSIS — Z9221 Personal history of antineoplastic chemotherapy: Secondary | ICD-10-CM | POA: Diagnosis not present

## 2021-08-19 DIAGNOSIS — R634 Abnormal weight loss: Secondary | ICD-10-CM

## 2021-08-19 LAB — IRON AND IRON BINDING CAPACITY (CC-WL,HP ONLY)
Iron: 84 ug/dL (ref 45–182)
Saturation Ratios: 26 % (ref 17.9–39.5)
TIBC: 322 ug/dL (ref 250–450)
UIBC: 238 ug/dL (ref 117–376)

## 2021-08-19 LAB — COMPREHENSIVE METABOLIC PANEL
ALT: 13 U/L (ref 0–44)
AST: 18 U/L (ref 15–41)
Albumin: 4.4 g/dL (ref 3.5–5.0)
Alkaline Phosphatase: 64 U/L (ref 38–126)
Anion gap: 7 (ref 5–15)
BUN: 19 mg/dL (ref 8–23)
CO2: 24 mmol/L (ref 22–32)
Calcium: 9.6 mg/dL (ref 8.9–10.3)
Chloride: 97 mmol/L — ABNORMAL LOW (ref 98–111)
Creatinine, Ser: 1.01 mg/dL (ref 0.61–1.24)
GFR, Estimated: >60 mL/min (ref >60)
Glucose, Bld: 87 mg/dL (ref 70–99)
Potassium: 4.1 mmol/L (ref 3.5–5.1)
Sodium: 128 mmol/L — ABNORMAL LOW (ref 135–145)
Total Bilirubin: 0.8 mg/dL (ref 0.3–1.2)
Total Protein: 7.3 g/dL (ref 6.5–8.1)

## 2021-08-19 LAB — CBC WITH DIFFERENTIAL/PLATELET
Abs Immature Granulocytes: 0.07 10*3/uL (ref 0.00–0.07)
Basophils Absolute: 0 10*3/uL (ref 0.0–0.1)
Basophils Relative: 0 %
Eosinophils Absolute: 0 10*3/uL (ref 0.0–0.5)
Eosinophils Relative: 0 %
HCT: 27.7 % — ABNORMAL LOW (ref 39.0–52.0)
Hemoglobin: 9.6 g/dL — ABNORMAL LOW (ref 13.0–17.0)
Immature Granulocytes: 2 %
Lymphocytes Relative: 8 %
Lymphs Abs: 0.4 10*3/uL — ABNORMAL LOW (ref 0.7–4.0)
MCH: 32.7 pg (ref 26.0–34.0)
MCHC: 34.7 g/dL (ref 30.0–36.0)
MCV: 94.2 fL (ref 80.0–100.0)
Monocytes Absolute: 0.8 10*3/uL (ref 0.1–1.0)
Monocytes Relative: 17 %
Neutro Abs: 3.3 10*3/uL (ref 1.7–7.7)
Neutrophils Relative %: 73 %
Platelets: 132 10*3/uL — ABNORMAL LOW (ref 150–400)
RBC: 2.94 MIL/uL — ABNORMAL LOW (ref 4.22–5.81)
RDW: 13.4 % (ref 11.5–15.5)
WBC: 4.5 10*3/uL (ref 4.0–10.5)
nRBC: 0 % (ref 0.0–0.2)

## 2021-08-19 LAB — MAGNESIUM: Magnesium: 1.8 mg/dL (ref 1.7–2.4)

## 2021-08-19 LAB — RETICULOCYTES
Immature Retic Fract: 11.6 % (ref 2.3–15.9)
RBC.: 2.9 MIL/uL — ABNORMAL LOW (ref 4.22–5.81)
Retic Count, Absolute: 42.6 10*3/uL (ref 19.0–186.0)
Retic Ct Pct: 1.5 % (ref 0.4–3.1)

## 2021-08-19 LAB — FERRITIN: Ferritin: 401 ng/mL — ABNORMAL HIGH (ref 24–336)

## 2021-08-19 LAB — VITAMIN B12: Vitamin B-12: 1464 pg/mL — ABNORMAL HIGH (ref 180–914)

## 2021-08-19 NOTE — Progress Notes (Signed)
[place]   Telephone:(336) [phone] Fax:(336) [phone]   Clinic Follow up Note   Patient Care Team: Vivi Barrack, MD as PCP - General (Family Medicine) Buford Dresser, MD as PCP - Cardiology (Cardiology) Chevis Pretty as Consulting Physician (Dentistry) [name], MD as Consulting Physician (Vascular Surgery) Warren Danes, PA-C as Physician Assistant (Dermatology) Malmfelt, Stephani Police, RN as Oncology Nurse Navigator Eppie Gibson, MD as Consulting Physician (Radiation Oncology) Leta Baptist, MD as Consulting Physician (Otolaryngology) Benay Pike, MD as Consulting Physician (Hematology and Oncology) Edythe Clarity, [facility] as Pharmacist (Pharmacist)  Date of Service:  08/19/2021  CHIEF COMPLAINT: f/u of oropharyngeal cancer after completion of chemoradiation.  SUMMARY OF ONCOLOGIC HISTORY: Oncology History Overview Note  Patient first noticed to have neck mass about 2 months prior to presentation. He denies any complaints except for neck mass, no difficulty swallowing, pain at the base of the tongue. He went to see his PCP who recommended an [place] which demonstrated hypoechoic mass at the right level II a highly concerning for an enlarged pathologic LN. He was referred to ENT and CT neck was ordered. CT neck showed enlarged right level 2 node, asymmetric soft tissue at the right tongue base, favored to reflect asymmetric tonsillar issue. 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 PET CT showed primary mucosal neoplasm in the right tongue base area demonstrating hypermetabolism.    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HCC)  10/21/2020 Initial Diagnosis   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[place])   10/21/2020 Cancer Staging   Staging form: Pharynx - HPV-Mediated Oropharynx, AJCC 8th Edition - Clinical stage from 10/21/2020: Stage I (cT1, cN1, cM0, p16+) - Signed  by Benay Pike, MD on 10/21/2020 Stage prefix: Initial diagnosis    11/10/2020 -  Chemotherapy    Patient is on Treatment Plan: HEAD/NECK CISPLATIN Q7D         CURRENT THERAPY:  None, completed chemotherapy. Surveillance  INTERVAL HISTORY:   Jeffrey [name] is here for a follow up of oropharyngeal cancer. He completed radiation on 12/29/2020.  He had 4 cycles of chemo, dose reduced, received a total dose of 100 mg per metered square. He couldn't tolerate any further chemotherapy Jeffrey [name] is here for follow-up after finishing chemoradiation.   End of treatment PET with complete response. Since last visit, he has been doing well. He is now at 166 lbs. No new sore throat. He last saw Dr Benjamine Mola in January, no concerns. No difficulty swallowing, he has been eating very well. No speech difficulty, has some dry mouth.  Rest of the pertinent 10 point ROS reviewed and negative.  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   Right Buccal Cheek(MOHS)   Osteopenia     SURGICAL HISTORY: Past Surgical History:  Procedure Laterality Date   ABDOMINAL AORTIC ENDOVASCULAR FENESTRATED STENT GRAFT N/A 03/21/2018   Procedure: ABDOMINAL AORTIC ENDOVASCULAR FENESTRATED STENT GRAFT ; [place] CT PERFORMED;  Surgeon: [name], MD;  Location: [place];  Service: Vascular;  Laterality: N/A;   Basal Skin Cancer  2012  CATARACT EXTRACTION Bilateral    IR GASTROSTOMY TUBE MOD SED  11/06/2020   IR GASTROSTOMY TUBE REMOVAL  04/19/2021   IR IMAGING GUIDED PORT INSERTION  11/06/2020   IR REMOVAL TUN ACCESS W/ PORT W/O FL MOD SED  07/15/2021   MASS BIOPSY Right 09/25/2020   Procedure: OPEN NECK MASS BIOPSY;   Surgeon: Leta Baptist, MD;  Location: [place];  Service: ENT;  Laterality: Right;   MASS EXCISION     x 2; throat and above lip   TONSILLECTOMY     TRANSURETHRAL RESECTION OF PROSTATE      ALLERGIES:  is allergic to ketotifen fumarate, mixed ragweed, and triamcinolone.  MEDICATIONS:  Current Outpatient Medications  Medication Sig Dispense Refill   amLODipine (NORVASC) 5 MG tablet Take 1 tablet (5 mg total) by mouth daily. 90 tablet 3   ARTIFICIAL TEAR SOLUTION OP Place 1 drop into both eyes daily as needed (dry eyes).     Ascorbic Acid (VITAMIN C) 1000 MG tablet Take 1,000 mg by mouth daily.     atorvastatin (LIPITOR) 40 MG tablet Take 1 tablet (40 mg total) by mouth daily at 6 PM. 90 tablet 3   Cyanocobalamin 1000 MCG TBCR Take 1,000 mcg by mouth daily.     Emollient (CERAVE) LOTN Apply 1 application topically daily as needed (after shower care).      ferrous sulfate 325 (65 FE) MG tablet Take 325 mg by mouth every other day.     fluticasone (FLONASE) 50 MCG/ACT nasal spray Place 1 spray into both nostrils every other day.     GLUCOSAMINE-CHONDROITIN PO Take 1 tablet by mouth 2 (two) times daily.      losartan (COZAAR) 25 MG tablet TAKE 1 TABLET(25 MG) BY MOUTH TWICE DAILY 180 tablet 3   No current facility-administered medications for this visit.    PHYSICAL EXAMINATION: ECOG PERFORMANCE STATUS: 2 - Symptomatic, <50% confined to bed  Vitals:   08/19/21 1243  BP: (!) 145/82  Pulse: 63  Resp: 16  Temp: 97.9 F (36.6 C)  SpO2: 100%    Physical Exam Constitutional:      Appearance: Normal appearance.  HENT:     Head: Normocephalic and atraumatic.     Mouth/Throat:     Comments: No tonsillar enlargement. Cardiovascular:     Rate and Rhythm: Normal rate and regular rhythm.  Pulmonary:     Effort: Pulmonary effort is normal.     Breath sounds: Normal breath sounds.  Abdominal:     General: Abdomen is flat. Bowel sounds are normal.  Musculoskeletal:      Cervical back: Normal range of motion and neck supple.  Neurological:     General: No focal deficit present.     Mental Status: He is alert.  Psychiatric:        Mood and Affect: Mood normal.     LABORATORY DATA:  I have reviewed the data as listed CBC Latest Ref Rng & Units 08/19/2021 07/07/2021 05/11/2021  WBC 4.0 - 10.5 K/uL 4.5 5.2 5.3  Hemoglobin 13.0 - 17.0 g/dL 9.6(L) 9.6(L) 9.8(L)  Hematocrit 39.0 - 52.0 % 27.7(L) 27.6(L) 28.5(L)  Platelets 150 - 400 K/uL 132(L) 98(L) 91.0 Repeated and verified X2.(L)     CMP Latest Ref Rng & Units 07/07/2021 05/11/2021 04/13/2021  Glucose 70 - 99 mg/dL 89 94 92  BUN 8 - 23 mg/dL 28(H) 20 23  Creatinine 0.61 - 1.24 mg/dL 1.12 1.00 1.03  Sodium 135 - 145 mmol/L  130(L) 129(L) 128(L)  Potassium 3.5 - 5.1 mmol/L 4.3 5.1 4.8  Chloride 98 - 111 mmol/L 97(L) 95(L) 95(L)  CO2 22 - 32 mmol/L 25 24 22   Calcium 8.9 - 10.3 mg/dL 9.9 9.8 10.0  Total Protein 6.0 - 8.3 g/dL - 7.2 7.5  Total Bilirubin 0.2 - 1.2 mg/dL - 0.9 0.8  Alkaline Phos 39 - 117 U/L - 56 62  AST 0 - 37 U/L - 22 20  ALT 0 - 53 U/L - 12 12    RADIOGRAPHIC STUDIES: I have personally reviewed the radiological images as listed and agreed with the findings in the report. No results found.   ASSESSMENT & PLAN:   Jeffrey [name] is a 81 y.o. male with   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[place]), TxN1M0, HPV(+) CT neck showed enlarged right level 2 node, asymmetric soft tissue at the right tongue base, favored to reflect asymmetric tonsillar issue. -Initial biopsy was inconclusive so he had an excisional biopsy which showed P16 positive right neck poorly differentiated SCC. -PET CT showed primary mucosal neoplasm in the right tongue base area demonstrating hypermetabolism. -He was started on concurrent chemoradiation therapy with cisplatin on 11/09/20. -He developed worsening thrombocytopenia, anemia and leukopenia following cycle 2. Chemo has been held since 11/19/20. -He overall got  100 mg per metered square of cisplatin, multiple delays due to cytopenias in the last 2 cycles, he only got 20 mg per metered square.   End of treatment PET with no concern for residual disease.   He is here for follow up. He is doing well. No complaints. No concerns for recurrence on Physical exam. Continue surveillance with Dr Benjamine Mola as recommended RTC with [place] in 6 months TSH will be done Oct 2023.  2. Chemotherapy-induced cytopenia Hb stable, thrombocytopenia improving. No iron deficiency, B12 levels normal. No indication for transfusion. Ok to monitor.  #3.  Weight loss, ongoing, weight today is 166 lbs. Encouraged him to maintain weight. He says he is eating quite a bit.  #4, TSH in October 2022 is normal, repeat recommended in 1 year.  #5, cyst noted below the port site, decreasing in size according to patient. Continue to monitor.  Plan:  -Completed chemotherapy and radiation, not very well-tolerated. RTC in 6 months.    Benay Pike, MD 08/19/2021

## 2021-08-20 LAB — FOLATE RBC
Folate, Hemolysate: 579 ng/mL
Folate, RBC: 1997 ng/mL (ref >498)
Hematocrit: 29 % — ABNORMAL LOW (ref 37.5–51.0)

## 2021-08-23 ENCOUNTER — Other Ambulatory Visit: Payer: Self-pay

## 2021-08-23 ENCOUNTER — Ambulatory Visit: Payer: Medicare Other | Admitting: Dermatology

## 2021-08-23 DIAGNOSIS — Z1283 Encounter for screening for malignant neoplasm of skin: Secondary | ICD-10-CM

## 2021-08-23 DIAGNOSIS — Z85828 Personal history of other malignant neoplasm of skin: Secondary | ICD-10-CM | POA: Diagnosis not present

## 2021-08-23 DIAGNOSIS — L821 Other seborrheic keratosis: Secondary | ICD-10-CM | POA: Diagnosis not present

## 2021-08-23 DIAGNOSIS — R21 Rash and other nonspecific skin eruption: Secondary | ICD-10-CM

## 2021-08-23 MED ORDER — CLOBETASOL PROP EMOLLIENT BASE 0.05 % EX CREA: 1.0000 "application " | TOPICAL_CREAM | Freq: Every day | CUTANEOUS | 0 refills | Status: DC

## 2021-08-23 NOTE — Patient Instructions (Signed)
TEST THE RX CREAM ON A LITTLE SPOT FOR 5 DAYS TO SEE IF THERE IS A REACTION  ?

## 2021-08-30 ENCOUNTER — Telehealth: Payer: Self-pay | Admitting: Pharmacist

## 2021-08-30 NOTE — Progress Notes (Signed)
? ? ?Chronic Care Management ?Pharmacy Assistant  ? ?Name: Jeffrey [name]  MRN: [id number] DOB: 03/17/1941 ? ? ?Reason for Encounter: Chart Review For Initial Visit With Clinical Pharmacist ?  ?Conditions to be addressed/monitored: ?HTN, CAD, Osteopenia, HLD, Hyperglycemia ? ?Primary concerns for visit include: ?HTN, HLD  ? ?Recent office visits:  ?04/13/2021 OV (PCP) Jeffrey Barrack, MD; no medication changes indicated. ? ?Recent consult visits:  ?08/23/2021 OV (Dermatology) Jeffrey Monarch, MD; Clobetasol Prop Emollient Bas 0.05% cream, test the Rx cream on a little spot for 5 days to see if there is a reaction ? ?08/19/2021 OV (Oncology) Jeffrey Pike, MD; -Completed chemotherapy and radiation, not very well-tolerated. RTC in 6 months. ? ?07/07/2021 OV (Oncology) Jeffrey Pike, MD; -Completed chemotherapy and radiation, not very well-tolerated. Return to clinic in 4 to 6 weeks for lab check and follow up. ? ?07/05/2021 OV (Vascular Surgeon) Jeffrey [name], MD; no medication changes indicated. ? ?06/02/2021 VV (Cardiology) Jeffrey Dresser, MD; no medication changes indicated. ? ?05/28/2021 OV Jeffrey [name]) Jeffrey Banister, MD; no medication changes indicated. ? ?04/09/2021 OV (Radiation Oncology) Jeffrey Gibson, MD; no medication changes indicated. ? ?03/03/2021 OV (Cardiology) Jeffrey Dresser, MD; no medication changes indicated. ? ?Hospital visits:  ?None in previous 6 months ? ?Medications: ?Outpatient Encounter Medications as of 08/30/2021  ?Medication Sig  ? amLODipine (NORVASC) 5 MG tablet Take 1 tablet (5 mg total) by mouth daily.  ? ARTIFICIAL TEAR SOLUTION OP Place 1 drop into both eyes daily as needed (dry eyes).  ? Ascorbic Acid (VITAMIN C) 1000 MG tablet Take 1,000 mg by mouth daily.  ? atorvastatin (LIPITOR) 40 MG tablet Take 1 tablet (40 mg total) by mouth daily at 6 PM.  ? Clobetasol Prop Emollient Base (CLOBETASOL PROPIONATE E) 0.05 % emollient cream Apply 1 application. topically daily.   ? Cyanocobalamin 1000 MCG TBCR Take 1,000 mcg by mouth daily.  ? Emollient (CERAVE) LOTN Apply 1 application topically daily as needed (after shower care).   ? ferrous sulfate 325 (65 FE) MG tablet Take 325 mg by mouth every other day.  ? fluticasone (FLONASE) 50 MCG/ACT nasal spray Place 1 spray into both nostrils every other day.  ? GLUCOSAMINE-CHONDROITIN PO Take 1 tablet by mouth 2 (two) times daily.   ? losartan (COZAAR) 25 MG tablet TAKE 1 TABLET(25 MG) BY MOUTH TWICE DAILY  ? ?No facility-administered encounter medications on file as of 08/30/2021.  ? ?Current Medications: ?Clobetasol Prop Emollient Base 0.05% last filled 08/23/2021 15 DS ?Losartan 25 mg last filled 06/16/2021 90 DS ?Atorvastatin 5 mg last filled 06/27/2021 90 DS ?Amlodipine 5 mg last filled 06/09/2021 90 DS ?Artificial Tear Solution ?Emollient Lotion ?Fluticasone 50 mcg/act nasal spray ?Ferrous Sulfate 325 mg every other day ?Vitamin C 1000 mg ?Glucosamine-Chondroitin PO takes every other day ?Vitamin B12 1000 mcg takes every other day ?Centrum Silver ? ?Patient Questions: ?Any changes in your medications or health? ?Patient states he had cancer but is currently in remission. He says he still has some side effects such as thick saliva. ? ?Any side effects from any medications? ?Patient denies having any side effects from any of his medications. ? ?Do you have any symptoms or problems not managed by your medications? ?Patient denies having any symptoms or problems that is not currently managed by his medications. ? ?Any concerns about your health right now? ?Patient states his back hurts all the time. ? ?Has your provider asked that you check blood pressure, blood sugar, or follow special diet at  home? ?Patient states he checks his blood pressure occasionally. He does not check blood sugar or follow any special diet. ? ?Do you get any type of exercise on a regular basis? ?Patient states he likes to walk some when he feels like it. His back  pain keeps him from doing too much. ? ?Can you think of a goal you would like to reach for your health? ?Patient states he would like to reduce some of his back pain. ? ?Do you have any problems getting your medications? ?Patient denies having any problems getting his medications. ? ?Is there anything that you would like to discuss during the appointment?  ?Patient states he doesn't have anything he would like to discuss. ? ?Please bring medications and supplements to appointment ? ?Care Gaps: ?Medicare Annual Wellness: Completed 04/12/2021 ?Hemoglobin A1C: 5.2% on 04/13/2021 ?Colonoscopy: Completed 2017 ? ?Future Appointments  ?Date Time Provider [place]  ?09/03/2021  8:30 AM [facility] CCM PHARMACIST [facility]  ?11/30/2021  8:40 AM Jeffrey Dresser, MD [facility]  ?01/07/2022 11:00 AM Jeffrey Gibson, MD [facility] None  ?02/18/2022 12:30 PM [facility] LAB [facility] None  ?02/18/2022  1:00 PM Jeffrey Pike, MD [facility] None  ?04/14/2022  1:00 PM Jeffrey Barrack, MD [facility]  ?04/25/2022 11:45 AM [facility] HEALTH COACH [facility]  ?08/24/2022 11:45 AM Jeffrey Monarch, MD [facility]  ? ? ?Star Rating Drugs: ?Losartan 25 mg last filled 06/16/2021 90 DS ?Atorvastatin 5 mg last filled 06/27/2021 90 DS ? ?April D Calhoun, [place] ?Clinical Pharmacist Assistant ?[phone]  ?

## 2021-09-01 ENCOUNTER — Encounter: Payer: Self-pay | Admitting: Dermatology

## 2021-09-01 NOTE — Progress Notes (Signed)
? ?  Follow-Up Visit ?  ?Subjective  ?Jeffrey [name] is a 81 y.o. male who presents for the following: Annual Exam (Pt here for annual screening. Pt has hx of bcc). ? ?Annual skin check, itchy rash on torso ?Location:  ?Duration:  ?Quality:  ?Associated Signs/Symptoms: ?Modifying Factors:  ?Severity:  ?Timing: ?Context:  ? ?Objective  ?Well appearing patient in no apparent distress; mood and affect are within normal limits. ?Waist up skin examination: No atypical pigmented lesions or nonmelanoma skin cancer. ? ?Mid Back ?Textured brown 5 mm flattopped papule ? 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 ? ? ? ? ? ? ? ? ?All skin waist up examined. ? ? ?Assessment & Plan  ? ? ?Screening exam for skin cancer ? ?Annual skin examination. ? ?Seborrheic keratosis ?Mid Back ? ?No intervention currently necessary ? ?Rash and other nonspecific skin eruption ?Chest (Upper Torso, Anterior); Torso - Posterior (Back) ? ?He has a history of sensitivity to topical triamcinolone so we will test an area on his arm to the clobetasol for 5 nights before using on his torso.  If no reaction apply daily to areas with rash for 3 to 4 weeks, then contact us for follow-up status report. ? ? ? ? ? ?I, Lavonna Monarch, MD, have reviewed all documentation for this visit.  The documentation on 09/01/21 for the exam, diagnosis, procedures, and orders are all accurate and complete. ?

## 2021-09-01 NOTE — Progress Notes (Signed)
? ?Chronic Care Management ?Pharmacy Note ? ?09/03/2021 ?Name:  Jeffrey [name] MRN:  [id number] DOB:  February 25, 1941 ? ?Summary: ?Initial visit with PharmD.  BP elevated past few OV.  He has not been checking at home.  Have asked him to start to monitor at home and record a few times per week.  Also having some dry mouth post chemo. ? ?Recommendations/Changes made from today's visit: ?Recommend Biotene mouthwash at night for dry mouth ?Monitor BP - could consider dose increase of losartan if BP remains elevated during home monitoring ? ?Plan: ?FU 6 months PharmD ?CMA BP check in 2 months ? ? ?Subjective: ?Jeffrey [name] is an 81 y.o. year old male who is a primary patient of [name], Algis Greenhouse, MD.  The CCM team was consulted for assistance with disease management and care coordination needs.   ? ?Engaged with patient by telephone for initial visit in response to provider referral for pharmacy case management and/or care coordination services.  ? ?Consent to Services:  ?The patient was given the following information about Chronic Care Management services today, agreed to services, and gave verbal consent: 1. CCM service includes personalized support from designated clinical staff supervised by the primary care provider, including individualized plan of care and coordination with other care providers 2. 24/7 contact phone numbers for assistance for urgent and routine care needs. 3. Service will only be billed when office clinical staff spend 20 minutes or more in a month to coordinate care. 4. Only one practitioner may furnish and bill the service in a calendar month. 5.The patient may stop CCM services at any time (effective at the end of the month) by phone call to the office staff. 6. The patient will be responsible for cost sharing (co-pay) of up to 20% of the service fee (after annual deductible is met). Patient agreed to services and consent obtained. ? ?Patient Care Team: ?Vivi Barrack, MD as PCP - General (Family  Medicine) ?Buford Dresser, MD as PCP - Cardiology (Cardiology) ?Chevis Pretty as Consulting Physician (Dentistry) ?[name], MD as Consulting Physician (Vascular Surgery) ?Starlyn Skeans as Librarian, academic (Dermatology) ?Malmfelt, Stephani Police, RN as Oncology Nurse Navigator ?Eppie Gibson, MD as Consulting Physician (Radiation Oncology) ?Leta Baptist, MD as Consulting Physician (Otolaryngology) ?Benay Pike, MD as Consulting Physician (Hematology and Oncology) ?Edythe Clarity, [facility] as Pharmacist (Pharmacist) ?Lavonna Monarch, MD as Consulting Physician (Dermatology) ? ?Recent office visits:  ?04/13/2021 OV (PCP) Vivi Barrack, MD; no medication changes indicated. ?  ?Recent consult visits:  ?08/23/2021 OV (Dermatology) Lavonna Monarch, MD; Clobetasol Prop Emollient Bas 0.05% cream, test the Rx cream on a little spot for 5 days to see if there is a reaction ?  ?08/19/2021 OV (Oncology) Benay Pike, MD; -Completed chemotherapy and radiation, not very well-tolerated. RTC in 6 months. ?  ?07/07/2021 OV (Oncology) Benay Pike, MD; -Completed chemotherapy and radiation, not very well-tolerated. Return to clinic in 4 to 6 weeks for lab check and follow up. ?  ?07/05/2021 OV (Vascular Surgeon) [name], MD; no medication changes indicated. ?  ?06/02/2021 VV (Cardiology) Buford Dresser, MD; no medication changes indicated. ?  ?05/28/2021 OV Gertie Fey) Milus Banister, MD; no medication changes indicated. ?  ?04/09/2021 OV (Radiation Oncology) Eppie Gibson, MD; no medication changes indicated. ?  ?03/03/2021 OV (Cardiology) Buford Dresser, MD; no medication changes indicated. ?  ?Hospital visits:  ?None in previous 6 months ? ? ?Objective: ? ?Lab Results  ?Component Value Date  ? CREATININE 1.01  08/19/2021  ? BUN 19 08/19/2021  ? GFR 71.21 05/11/2021  ? GFRNONAA >60 08/19/2021  ? GFRAA 75 02/03/2020  ? NA 128 (L) 08/19/2021  ? K 4.1 08/19/2021  ? CALCIUM 9.6  08/19/2021  ? CO2 24 08/19/2021  ? GLUCOSE 87 08/19/2021  ? ? ?Lab Results  ?Component Value Date/Time  ? HGBA1C 5.2 04/13/2021 02:19 PM  ? HGBA1C 4.7 04/09/2020 02:17 PM  ? GFR 71.21 05/11/2021 01:44 PM  ? GFR 68.77 04/13/2021 02:19 PM  ?  ?Last diabetic Eye exam: No results found for: HMDIABEYEEXA  ?Last diabetic Foot exam: No results found for: HMDIABFOOTEX  ? ?Lab Results  ?Component Value Date  ? CHOL 126 04/13/2021  ? HDL 71.50 04/13/2021  ? [place] 44 04/13/2021  ? TRIG 53.0 04/13/2021  ? CHOLHDL 2 04/13/2021  ? ? ?Hepatic Function Latest Ref Rng & Units 08/19/2021 05/11/2021 04/13/2021  ?Total Protein 6.5 - 8.1 g/dL 7.3 7.2 7.5  ?Albumin 3.5 - 5.0 g/dL 4.4 4.5 4.5  ?AST 15 - 41 U/L '18 22 20  ' ?ALT 0 - 44 U/L '13 12 12  ' ?Alk Phosphatase 38 - 126 U/L 64 56 62  ?Total Bilirubin 0.3 - 1.2 mg/dL 0.8 0.9 0.8  ? ? ?Lab Results  ?Component Value Date/Time  ? TSH 1.75 04/13/2021 02:19 PM  ? TSH 1.54 04/09/2020 02:17 PM  ? ? ?CBC Latest Ref Rng & Units 08/19/2021 08/19/2021 07/07/2021  ?WBC 4.0 - 10.5 K/uL 4.5 - 5.2  ?Hemoglobin 13.0 - 17.0 g/dL 9.6(L) - 9.6(L)  ?Hematocrit 37.5 - 51.0 % 29.0(L) 27.7(L) 27.6(L)  ?Platelets 150 - 400 K/uL 132(L) - 98(L)  ? ? ?No results found for: VD25OH ? ?Clinical ASCVD: Yes  ?The ASCVD Risk score (Arnett DK, et al., 2019) failed to calculate for the following reasons: ?  The 2019 ASCVD risk score is only valid for ages 39 to 56   ? ?Depression screen [facility] 2/9 04/12/2021 04/06/2020  ?Decreased Interest 0 0  ?Down, Depressed, Hopeless 0 0  ?PHQ - 2 Score 0 0  ?Some recent data might be hidden  ?  ? ?Social History  ? ?Tobacco Use  ?Smoking Status Former  ? Packs/day: 1.00  ? Years: 32.00  ? Pack years: 32.00  ? Types: Cigarettes  ? Quit date: 08/17/1990  ? Years since quitting: 31.0  ?Smokeless Tobacco Never  ? ?BP Readings from Last 3 Encounters:  ?08/19/21 (!) 145/82  ?07/07/21 (!) 146/73  ?07/05/21 (!) 149/78  ? ?Pulse Readings from Last 3 Encounters:  ?08/19/21 63  ?07/07/21 63  ?07/05/21 60   ? ?Wt Readings from Last 3 Encounters:  ?08/19/21 166 lb 11.2 oz (75.6 kg)  ?07/07/21 171 lb 14.4 oz (78 kg)  ?07/05/21 169 lb (76.7 kg)  ? ?BMI Readings from Last 3 Encounters:  ?08/19/21 24.62 kg/m?  ?07/07/21 25.39 kg/m?  ?07/05/21 24.96 kg/m?  ? ? ?Assessment/Interventions: Review of patient past medical history, allergies, medications, health status, including review of consultants reports, laboratory and other test data, was performed as part of comprehensive evaluation and provision of chronic care management services.  ? ?SDOH:  (Social Determinants of Health) assessments and interventions performed: Yes ? ?Financial Resource Strain: Low Risk   ? Difficulty of Paying Living Expenses: Not hard at all  ? ?Food Insecurity: No Food Insecurity  ? Worried About Charity fundraiser in the Last Year: Never true  ? Ran Out of Food in the Last Year: Never true  ? ? ?SDOH Screenings  ? ?  Alcohol Screen: Not on file  ?Depression (PHQ2-9): Low Risk   ? PHQ-2 Score: 0  ?Financial Resource Strain: Low Risk   ? Difficulty of Paying Living Expenses: Not hard at all  ?Food Insecurity: No Food Insecurity  ? Worried About Charity fundraiser in the Last Year: Never true  ? Ran Out of Food in the Last Year: Never true  ?Housing: Low Risk   ? Last Housing Risk Score: 0  ?Physical Activity: Inactive  ? Days of Exercise per Week: 0 days  ? Minutes of Exercise per Session: 0 min  ?Social Connections: Moderately Isolated  ? Frequency of Communication with Friends and Family: More than three times a week  ? Frequency of Social Gatherings with Friends and Family: Once a week  ? Attends Religious Services: Never  ? Active Member of Clubs or Organizations: Yes  ? Attends Archivist Meetings: Never  ? Marital Status: Divorced  ?Stress: No Stress Concern Present  ? Feeling of Stress : Not at all  ?Tobacco Use: Medium Risk  ? Smoking Tobacco Use: Former  ? Smokeless Tobacco Use: Never  ? Passive Exposure: Not on file   ?Transportation Needs: No Transportation Needs  ? Lack of Transportation (Medical): No  ? Lack of Transportation (Non-Medical): No  ? ? ?CCM Care Plan ? ?Allergies  ?Allergen Reactions  ? Ketotifen Fumarate Swelling

## 2021-09-03 ENCOUNTER — Ambulatory Visit (INDEPENDENT_AMBULATORY_CARE_PROVIDER_SITE_OTHER): Payer: Medicare Other | Admitting: Pharmacist

## 2021-09-03 DIAGNOSIS — M8580 Other specified disorders of bone density and structure, unspecified site: Secondary | ICD-10-CM

## 2021-09-03 DIAGNOSIS — E785 Hyperlipidemia, unspecified: Secondary | ICD-10-CM

## 2021-09-03 DIAGNOSIS — I10 Essential (primary) hypertension: Secondary | ICD-10-CM

## 2021-09-03 NOTE — Patient Instructions (Addendum)
Visit Information ? ? Goals Addressed   ? ?  ?  ?  ?  ? This Visit's Progress  ?  Track and Manage My Blood Pressure-Hypertension     ?  Timeframe:  Long-Range Goal ?Priority:  High ?Start Date:   09/03/21                          ?Expected End Date: 03/06/22                     ? ?Follow Up Date 12/04/21  ?  ?- check blood pressure weekly ?- choose a place to take my blood pressure (home, clinic or office, retail store) ?- write blood pressure results in a log or diary  ?  ?Why is this important?   ?You won't feel high blood pressure, but it can still hurt your blood vessels.  ?High blood pressure can cause heart or kidney problems. It can also cause a stroke.  ?Making lifestyle changes like losing a little weight or eating less salt will help.  ?Checking your blood pressure at home and at different times of the day can help to control blood pressure.  ?If the doctor prescribes medicine remember to take it the way the doctor ordered.  ?Call the office if you cannot afford the medicine or if there are questions about it.   ?  ?Notes:  ?  ? ?  ? ?Patient Care Plan: General Pharmacy (Adult)  ?  ? ?Problem Identified: HTN, CAD, Osteopenia, HLD, Cancer   ?Priority: High  ?Onset Date: 09/03/2021  ?  ? ?Long-Range Goal: Patient-Specific Goal   ?Start Date: 09/03/2021  ?Expected End Date: 03/06/2022  ?This Visit's Progress: On track  ?Priority: High  ?Note:   ?Current Barriers:  ?Elevated BP ? ?Pharmacist Clinical Goal(s):  ?Patient will achieve improvement in BP as evidenced by monitoring at home through collaboration with PharmD and provider.  ? ?Interventions: ?1:1 collaboration with Vivi Barrack, MD regarding development and update of comprehensive plan of care as evidenced by provider attestation and co-signature ?Inter-disciplinary care team collaboration (see longitudinal plan of care) ?Comprehensive medication review performed; medication list updated in electronic medical record ? ?Hypertension (BP goal  <140/90) ?-Not ideally controlled ?-Current treatment: ?Amlodipine '5mg'$  daily Appropriate, Query effective, ,  ?Losartan '25mg'$  daily Appropriate, Query effective, ,  ?-Medications previously tried: none notedf  ?-Current home readings: not checking as much lately, no home readings to discuss ?-Current dietary habits: getting better each day after chemo, taste buds starting to come back ?-Current exercise habits: some walking, limited by back pain ?-Denies hypotensive/hypertensive symptoms ?-Educated on BP goals and benefits of medications for prevention of heart attack, stroke and kidney damage; ?Exercise goal of 150 minutes per week; ?Importance of home blood pressure monitoring; ?Symptoms of hypotension and importance of maintaining adequate hydration; ?-Counseled to monitor BP at home a few times per week, document, and provide log at future appointments ?-Recommended to continue current medication ?Monitor BP at home, will have CMA call and check in a month or so to see how BP is running.  Room to increase doses of losartan and amlodipine if need be to keep BP at goal. ? ?Hyperlipidemia/CAD: (LDL goal < 70) ?-Controlled ?-Current treatment: ?Atorvastatin '40mg'$  Appropriate, Effective, Safe, Accessible ?-Medications previously tried: none noted  ?-Current dietary patterns: see HTN ?-Current exercise habits: see HTn ?-Educated on Cholesterol goals;  ?Benefits of statin for ASCVD risk reduction; ?Importance  of limiting foods high in cholesterol; ?-Recommended to continue current medication ?LDL controlled, adherent to med, no adverse effects ? ?Osteopenia (Goal Prevent fractures/falls) ?-Controlled ? ?-Current treatment  ?None ?-Medications previously tried: none noted  ?-Recommend [phone] units of vitamin D daily. Recommend 1200 mg of calcium daily from dietary and supplemental sources. Recommend weight-bearing and muscle strengthening exercises for building and maintaining bone density. ?-Recommend continue current  management, no changes at this time, ? ?Cancer (Goal: Continue in remission) ?-Controlled ?-Current treatment  ?None ?-Medications previously tried: none noted ?-He is currently having some severe dry mouth and thick saliva.  This is his biggest concern post chemo.  He will swish with water sometimes and this helps.  Waking up at night feeling like lips and gums are stuck together.  ?-Recommended he could try Biotene mouthwash at night before bed to see if this helps with symptoms.  Patient agreeable to try. ? ?Patient Goals/Self-Care Activities ?Patient will:  ?- focus on medication adherence by pill counts ?check blood pressure a few times per week, document, and provide at future appointments ? ?Follow Up Plan: The care management team will reach out to the patient again over the next 180 days.  ? ?  ? ? ?Mr. Routson was given information about Chronic Care Management services today including:  ?CCM service includes personalized support from designated clinical staff supervised by his physician, including individualized plan of care and coordination with other care providers ?24/7 contact phone numbers for assistance for urgent and routine care needs. ?Standard insurance, coinsurance, copays and deductibles apply for chronic care management only during months in which we provide at least 20 minutes of these services. Most insurances cover these services at 100%, however patients may be responsible for any copay, coinsurance and/or deductible if applicable. This service may help you avoid the need for more expensive face-to-face services. ?Only one practitioner may furnish and bill the service in a calendar month. ?The patient may stop CCM services at any time (effective at the end of the month) by phone call to the office staff. ? ?Patient agreed to services and verbal consent obtained.  ? ?The patient verbalized understanding of instructions, educational materials, and care plan provided today and agreed to  receive a mailed copy of patient instructions, educational materials, and care plan.  ?Telephone follow up appointment with pharmacy team member scheduled for: 6 months ? ?Edythe Clarity, [facility]  ?Beverly Milch, PharmD ?Clinical Pharmacist  ?[place] July ?[phone] ?

## 2021-09-17 DIAGNOSIS — E785 Hyperlipidemia, unspecified: Secondary | ICD-10-CM | POA: Diagnosis not present

## 2021-09-17 DIAGNOSIS — I10 Essential (primary) hypertension: Secondary | ICD-10-CM

## 2021-11-30 ENCOUNTER — Encounter (HOSPITAL_BASED_OUTPATIENT_CLINIC_OR_DEPARTMENT_OTHER): Payer: Self-pay | Admitting: Cardiology

## 2021-11-30 ENCOUNTER — Telehealth (INDEPENDENT_AMBULATORY_CARE_PROVIDER_SITE_OTHER): Payer: Medicare Other | Admitting: Cardiology

## 2021-11-30 VITALS — BP 136/79 | HR 60 | Ht 69.0 in | Wt 155.0 lb

## 2021-11-30 DIAGNOSIS — Z9889 Other specified postprocedural states: Secondary | ICD-10-CM | POA: Diagnosis not present

## 2021-11-30 DIAGNOSIS — I714 Abdominal aortic aneurysm, without rupture, unspecified: Secondary | ICD-10-CM

## 2021-11-30 DIAGNOSIS — E785 Hyperlipidemia, unspecified: Secondary | ICD-10-CM

## 2021-11-30 DIAGNOSIS — I10 Essential (primary) hypertension: Secondary | ICD-10-CM

## 2021-11-30 DIAGNOSIS — I739 Peripheral vascular disease, unspecified: Secondary | ICD-10-CM | POA: Diagnosis not present

## 2021-11-30 DIAGNOSIS — Z9221 Personal history of antineoplastic chemotherapy: Secondary | ICD-10-CM

## 2021-11-30 DIAGNOSIS — I2510 Atherosclerotic heart disease of native coronary artery without angina pectoris: Secondary | ICD-10-CM

## 2021-11-30 NOTE — Progress Notes (Signed)
Virtual Visit via Video Note   This visit type was conducted due to national recommendations for restrictions regarding the COVID-19 Pandemic (e.g. social distancing) in an effort to limit this patient's exposure and mitigate transmission in our community.  Due to his co-morbid illnesses, this patient is at least at moderate risk for complications without adequate follow up.  This format is felt to be most appropriate for this patient at this time.  All issues noted in this document were discussed and addressed.  A limited physical exam was performed with this format.  Please refer to the patient's chart for his consent to telehealth for [facility].      The patient was identified using 2 identifiers.  Patient Location: Home Provider Location: Office/Clinic   Date:  11/30/2021   ID:  Jeffrey [name], DOB [birth year], MRN [id number]  PCP:  Vivi Barrack, MD  Cardiologist:  Buford Dresser, MD PhD  Referring MD: Vivi Barrack, MD   Chief complaint: follow up  History of Present Illness:    Jeffrey [name] is a 81 y.o. male with a hx of AAA s/p EVAR by Dr. 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 I initially met him 02/02/18 as a new consult at the request of Dr. [name] for preoperative evaluation prior to vascular surgery.   Today: Doing well overall. Some fatigue, but he can go to the store, etc for a short time. Eating and working on gaining weight, trying to get to 160 lbs. He is in remission, follows with surveillance imaging.   Blood pressures have been well controlled, no high/lows except when he is in the doctor's office (always higher).  No severe bleeding, did notice some bleeding after tooth pulled last week but this stopped on its own. Reviewed labs from March.   Denies chest pain, shortness of breath at rest or with normal exertion. No PND,  orthopnea, LE edema or unexpected weight gain. No syncope or palpitations.   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   Right Buccal Cheek([facility])   Osteopenia     Past Surgical History:  Procedure Laterality Date   ABDOMINAL AORTIC ENDOVASCULAR FENESTRATED STENT GRAFT N/A 03/21/2018   Procedure: ABDOMINAL AORTIC ENDOVASCULAR FENESTRATED STENT GRAFT ; [place] CT PERFORMED;  Surgeon: [name], MD;  Location: [facility] OR;  Service: Vascular;  Laterality: N/A;   Basal Skin Cancer  2012   CATARACT EXTRACTION Bilateral    IR GASTROSTOMY TUBE MOD SED  11/06/2020   IR GASTROSTOMY TUBE REMOVAL  04/19/2021   IR IMAGING GUIDED PORT INSERTION  11/06/2020   IR REMOVAL TUN ACCESS W/ PORT W/O FL MOD SED  07/15/2021   MASS BIOPSY Right 09/25/2020   Procedure: OPEN NECK MASS BIOPSY;  Surgeon: Leta Baptist, MD;  Location: [facility];  Service: ENT;  Laterality: Right;   MASS EXCISION     x 2; throat and above lip   TONSILLECTOMY     TRANSURETHRAL RESECTION OF PROSTATE      Current Medications: Current Outpatient Medications on File Prior to  Visit  Medication Sig   amLODipine (NORVASC) 5 MG tablet Take 1 tablet (5 mg total) by mouth daily.   ARTIFICIAL TEAR SOLUTION OP Place 1 drop into both eyes daily as needed (dry eyes).   Ascorbic Acid (VITAMIN C) 1000 MG tablet Take 1,000 mg by mouth daily.   atorvastatin (LIPITOR) 40 MG tablet Take 1 tablet (40 mg total) by mouth daily at 6 PM.   Clobetasol Prop Emollient Base (CLOBETASOL PROPIONATE E) 0.05 % emollient cream Apply 1 application. topically daily.   Cyanocobalamin 1000 MCG TBCR Take 1,000 mcg by mouth daily.   Emollient (CERAVE) LOTN Apply 1 application  topically daily as needed (after shower care).    ferrous sulfate 325 (65 FE) MG tablet Take 325 mg by mouth every other day.   fluticasone (FLONASE) 50 MCG/ACT nasal spray Place 1 spray into both nostrils every other day.   GLUCOSAMINE-CHONDROITIN PO Take 1 tablet by mouth 2 (two) times daily.    losartan (COZAAR) 25 MG tablet TAKE 1 TABLET(25 MG) BY MOUTH TWICE DAILY   No current facility-administered medications on file prior to visit.     Allergies:   Ketotifen fumarate, Mixed ragweed, and Triamcinolone   Social History   Tobacco Use   Smoking status: Former    Packs/day: 1.00    Years: 32.00    Total pack years: 32.00    Types: Cigarettes    Quit date: 08/17/1990    Years since quitting: 31.3   Smokeless tobacco: Never  Vaping Use   Vaping Use: Never used  Substance Use Topics   Alcohol use: Not Currently    Comment: Occassionally, "when I feel like it"   Drug use: No    Family History: The patient's family history includes Alcohol abuse in his father; Colon cancer (age of onset: 66) in his paternal grandmother; Diabetes in his mother; Heart attack in his father; Heart disease in his father; Hyperlipidemia in his sister; Hypertension in his father, mother, and son; Pancreatic cancer in his brother and sister; Stroke in his mother. There is no history of Esophageal cancer, Stomach cancer, or Liver disease.  ROS:   Please see the history of present illness.  Additional pertinent ROS:  EKGs/Labs/Other Studies Reviewed:    The following studies were reviewed today: Echo 03/08/21  1. Left ventricular ejection fraction, by estimation, is 60 to 65%. The  left ventricle has normal function. The left ventricle has no regional  wall motion abnormalities. Left ventricular diastolic parameters were  normal.   2. Right ventricular systolic function is normal. The right ventricular  size is normal.   3. The mitral valve is normal in structure. Trivial mitral valve  regurgitation.    4. The aortic valve is normal in structure. Aortic valve regurgitation is  not visualized.  EKG:  EKG is personally reviewed today.   03/03/21 NSR at 72 bpm, nonspecific t wave pattern  Recent Labs: 04/13/2021: TSH 1.75 08/19/2021: ALT 13; BUN 19; Creatinine, Ser 1.01; Hemoglobin 9.6; Magnesium 1.8; Platelets 132; Potassium 4.1; Sodium 128  Recent Lipid Panel    Component Value Date/Time   CHOL 126 04/13/2021 1419   CHOL 117 02/03/2020 1134   TRIG 53.0 04/13/2021 1419   HDL 71.50 04/13/2021 1419   HDL 60 02/03/2020 1134   CHOLHDL 2 04/13/2021 1419   VLDL 10.6 04/13/2021 1419   LDLCALC 44 04/13/2021 1419   LDLCALC 43 04/09/2020 1417    Physical Exam:    VS:  BP 136/79  Pulse 60   Ht '5\' 9"'  (1.753 m)   Wt 155 lb (70.3 kg)   BMI 22.89 kg/m     Wt Readings from Last 3 Encounters:  11/30/21 155 lb (70.3 kg)  08/19/21 166 lb 11.2 oz (75.6 kg)  07/07/21 171 lb 14.4 oz (78 kg)    VITAL SIGNS:  reviewed GEN:  no acute distress EYES:  sclerae anicteric, EOMI - Extraocular Movements Intact RESPIRATORY:  normal respiratory effort, symmetric expansion CARDIOVASCULAR:  no visible JVD SKIN:  no rash, lesions or ulcers. MUSCULOSKELETAL:  no obvious deformities. NEURO:  alert and oriented x 3, no obvious focal deficit PSYCH:  normal affect   ASSESSMENT:    1. Abdominal aortic aneurysm (AAA) without rupture, unspecified part ([place])   2. History of stent insertion of renal artery   3. PVD (peripheral vascular disease) ([place])   4. Essential hypertension   5. Hx antineoplastic chemotherapy   6. Hyperlipidemia, unspecified hyperlipidemia type   7. Coronary artery calcification seen on CT scan      PLAN:    Squamous cell carcinoma of the oropharynx, s/p cisplatin and radiation -echo reassuring 02/2021 -no evidence of active disease on recent scans, PEG tube removed, port still in place  AAA s/p FEVAR and bilateral renal stents 2019: followed by Dr. Trula Slade -continue  statin -aspirin on hold given cytopenia, counts remain abnormal but improving/ Last platelets 132k, prior 90k. If counts over 150k and Hgb improving, would restart aspirin and monitor for bleeding. -last seen by Dr. Trula Slade 07/05/2021. 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 Limit salt. -recommend moderate walking, 3-5 times/week for 30-50 minutes each session. Aim for at least 150 minutes.week. Goal should be pace of 3 miles/hours, or walking 1.5 miles in 30 minutes -recommend avoidance of tobacco products. Avoid excess alcohol.  Plan for follow up: 6 mos to follow up labs/discuss aspirin, sooner as needed  Today, I have spent 17 minutes with the patient with telehealth technology discussing the above problems.  Additional time spent in chart review, documentation, and communication.   Medication Adjustments/Labs and Tests Ordered: Current medicines are reviewed at length with the patient today.  Concerns regarding medicines are outlined above.  No orders of the defined types were placed in this encounter.  No orders of the defined types were placed in this encounter.  Patient Instructions  Medication Instructions:  Your Physician recommend you continue on your current medication as directed.    *If you need a refill on your cardiac medications before your next appointment, please call your pharmacy*   Lab Work: None ordered today  Testing/Procedures: None ordered today    Follow-Up: At [facility], you and  your health needs are our priority.  As part of our continuing mission to provide you with exceptional heart care, we have created designated Provider Care Teams.  These Care Teams include your primary Cardiologist (physician) and Advanced Practice Providers (APPs -  Physician Assistants and Nurse Practitioners) who all work together to provide you with the care you need, when you need it.  We recommend signing up for the patient portal called "MyChart".  Sign up information is provided on this After Visit Summary.  MyChart is used to connect with patients for Virtual Visits (Telemedicine).  Patients are able to view lab/test results, encounter notes, upcoming appointments, etc.  Non-urgent messages can be sent to your provider as well.   To learn more about what you can do with MyChart, go to [email or web address].    Your next appointment:   6 month(s)  The format for your next appointment:   Virtual Visit  or In Person   Provider:   Buford Dresser, MD{  Other Instructions Heart Healthy Diet Recommendations: A low-salt diet is recommended. Meats should be grilled, baked, or boiled. Avoid fried foods. Focus on lean protein sources like fish or chicken with vegetables and fruits. The American Heart Association is a Microbiologist!  American Heart Association Diet and Lifeystyle Recommendations   Exercise recommendations: The American Heart Association recommends 150 minutes of moderate intensity exercise weekly. Try 30 minutes of moderate intensity exercise 4-5 times per week. This could include walking, jogging, or swimming.   Important Information About Sugar         Signed, Buford Dresser, MD PhD 11/30/2021    [place]

## 2021-11-30 NOTE — Patient Instructions (Signed)
Medication Instructions:  Your Physician recommend you continue on your current medication as directed.    *If you need a refill on your cardiac medications before your next appointment, please call your pharmacy*   Lab Work: None ordered today  Testing/Procedures: None ordered today    Follow-Up: At [facility], you and your health needs are our priority.  As part of our continuing mission to provide you with exceptional heart care, we have created designated Provider Care Teams.  These Care Teams include your primary Cardiologist (physician) and Advanced Practice Providers (APPs -  Physician Assistants and Nurse Practitioners) who all work together to provide you with the care you need, when you need it.  We recommend signing up for the patient portal called "MyChart".  Sign up information is provided on this After Visit Summary.  MyChart is used to connect with patients for Virtual Visits (Telemedicine).  Patients are able to view lab/test results, encounter notes, upcoming appointments, etc.  Non-urgent messages can be sent to your provider as well.   To learn more about what you can do with MyChart, go to [email or web address].    Your next appointment:   6 month(s)  The format for your next appointment:   Virtual Visit  or In Person   Provider:   Buford Dresser, MD{  Other Instructions Heart Healthy Diet Recommendations: A low-salt diet is recommended. Meats should be grilled, baked, or boiled. Avoid fried foods. Focus on lean protein sources like fish or chicken with vegetables and fruits. The American Heart Association is a Microbiologist!  American Heart Association Diet and Lifeystyle Recommendations   Exercise recommendations: The American Heart Association recommends 150 minutes of moderate intensity exercise weekly. Try 30 minutes of moderate intensity exercise 4-5 times per week. This could include walking, jogging, or swimming.   Important Information  About Sugar

## 2021-12-08 ENCOUNTER — Other Ambulatory Visit: Payer: Self-pay | Admitting: *Deleted

## 2021-12-08 DIAGNOSIS — I10 Essential (primary) hypertension: Secondary | ICD-10-CM

## 2021-12-08 MED ORDER — LOSARTAN POTASSIUM 25 MG PO TABS: 25.0000 mg | ORAL_TABLET | Freq: Two times a day (BID) | ORAL | 3 refills | Status: DC

## 2022-01-07 ENCOUNTER — Encounter: Payer: Self-pay | Admitting: Radiation Oncology

## 2022-01-07 ENCOUNTER — Other Ambulatory Visit: Payer: Self-pay

## 2022-01-07 ENCOUNTER — Ambulatory Visit
Admission: RE | Admit: 2022-01-07 | Discharge: 2022-01-07 | Disposition: A | Discharge status: Home / Self Care | Payer: Medicare Other | Source: Ambulatory Visit | Attending: Radiation Oncology | Admitting: Radiation Oncology

## 2022-01-07 VITALS — BP 148/72 | HR 58 | Temp 98.3°F | Resp 18 | Ht 69.0 in | Wt 163.5 lb

## 2022-01-07 DIAGNOSIS — Z79899 Other long term (current) drug therapy: Secondary | ICD-10-CM | POA: Diagnosis not present

## 2022-01-07 DIAGNOSIS — Z923 Personal history of irradiation: Secondary | ICD-10-CM | POA: Insufficient documentation

## 2022-01-07 DIAGNOSIS — Z85810 Personal history of malignant neoplasm of tongue: Secondary | ICD-10-CM | POA: Diagnosis not present

## 2022-01-07 DIAGNOSIS — C01 Malignant neoplasm of base of tongue: Secondary | ICD-10-CM

## 2022-01-07 NOTE — Progress Notes (Signed)
Radiation Oncology         [phone] ________________________________  Name: Jeffrey [name] MRN: [id number]  Date: 01/07/2022  DOB: 02/24/1941  Follow-Up Visit Note  CC: Jeffrey Barrack, MD  Jeffrey Baptist, MD  Diagnosis and Prior Radiotherapy:       ICD-10-CM   1. 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       Cancer Staging  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[place]) Staging form: Pharynx - HPV-Mediated Oropharynx, AJCC 8th Edition - Clinical stage from 10/21/2020: Stage I (cT1, cN1, cM0, p16+) - Signed by Jeffrey Pike, MD on 10/21/2020 Stage prefix: Initial diagnosis   CHIEF COMPLAINT:  Here for follow-up and surveillance of throat cancer  Narrative:   Jeffrey [name] presents today for follow-up after completing radiation to his oropharynx on 12/29/2020  Pain issues, if any: Patient denies Using a feeding tube?: N/A--removed 04/19/2022 Weight changes, if any:  Wt Readings from Last 3 Encounters:  01/07/22 163 lb 8 oz (74.2 kg)  11/30/21 155 lb (70.3 kg)  08/19/21 166 lb 11.2 oz (75.6 kg)   Swallowing issues, if any: Denies. Reports he has a healthy appetite and can eat/drink a wide variety Smoking or chewing tobacco? None Using fluoride trays daily? N/A--reports he sees a periodontist every 4 months Last ENT visit was on: 07/12/2021 Saw Dr. Leta [name]    Reports he's scheduled to see Dr. Benjamine [name] again on 01/17/22  Other notable issues, if any: Last saw medical oncologist Dr. Chryl [name] on 08/19/2021 He is here for follow up. He is doing well. No complaints. No concerns for recurrence on Physical exam. Continue surveillance with Dr Jeffrey [name] as recommended RTC with [place] in 6 months TSH will be done Oct 2023.  Continues to deal with dry mouth and thick saliva. Denies any ear or jaw pain, or difficulty opening his mouth. Denies any concerns of swelling under his chin/down his neck. Reports he's diligent about doing his PT stretches and exercises. Overall reports he feels good and is  doing well                   ALLERGIES:  is allergic to ketotifen fumarate, mixed ragweed, and triamcinolone.  Meds: Current Outpatient Medications  Medication Sig Dispense Refill   amLODipine (NORVASC) 5 MG tablet Take 1 tablet (5 mg total) by mouth daily. 90 tablet 3   ARTIFICIAL TEAR SOLUTION OP Place 1 drop into both eyes daily as needed (dry eyes).     Ascorbic Acid (VITAMIN C) 1000 MG tablet Take 1,000 mg by mouth daily.     atorvastatin (LIPITOR) 40 MG tablet Take 1 tablet (40 mg total) by mouth daily at 6 PM. 90 tablet 3   Clobetasol Prop Emollient Base (CLOBETASOL PROPIONATE E) 0.05 % emollient cream Apply 1 application. topically daily. 30 g 0   Cyanocobalamin 1000 MCG TBCR Take 1,000 mcg by mouth daily.     Emollient (CERAVE) LOTN Apply 1 application topically daily as needed (after shower care).      ferrous sulfate 325 (65 FE) MG tablet Take 325 mg by mouth every other day.     fluticasone (FLONASE) 50 MCG/ACT nasal spray Place 1 spray into both nostrils every other day.     GLUCOSAMINE-CHONDROITIN PO Take 1 tablet by mouth 2 (two) times daily.      losartan (COZAAR) 25 MG tablet Take 1 tablet (25 mg total) by mouth 2 (two) times daily. 180 tablet 3   No current facility-administered medications  for this encounter.    Physical Findings: The patient is in no acute distress. Patient is alert and oriented. Wt Readings from Last 3 Encounters:  01/07/22 163 lb 8 oz (74.2 kg)  11/30/21 155 lb (70.3 kg)  08/19/21 166 lb 11.2 oz (75.6 kg)    height is '5\' 9"'$  (1.753 m) and weight is 163 lb 8 oz (74.2 kg). His oral temperature is 98.3 F (36.8 C). His blood pressure is 148/72 (abnormal) and his pulse is 58 (abnormal). His respiration is 18 and oxygen saturation is 96%. .  General: Alert and oriented, in no acute distress HEENT: Head is normocephalic. Extraocular movements are intact. Oropharynx is notable for clear mucosa, no thrush Neck: Neck is notable for intact skin, no masses  palpated  Skin: Skin in treatment fields shows satisfactory healing   Lymphatics: see Neck Exam Psychiatric: Judgment and insight are intact. Affect is appropriate. Heart regular in rate and rhythm Chest clear to auscultation bilaterally Abd soft, NT ND Ext: no edema  Lab Findings: Lab Results  Component Value Date   WBC 4.5 08/19/2021   HGB 9.6 (L) 08/19/2021   HCT 29.0 (L) 08/19/2021   MCV 94.2 08/19/2021   PLT 132 (L) 08/19/2021    Lab Results  Component Value Date   TSH 1.75 04/13/2021    Radiographic Findings: No results found.  Impression/Plan:    1) Head and Neck Cancer Status: No evidence of disease.  He continues to be in remission.  Quality life is good.  2) Nutritional Status: Doing well  PEG tube: Has been removed  3)  Swallowing: doing well, continue speech-language pathology exercises - eating a wide variety of food  4) Dental: Encouraged to continue regular followup with dentistry, and dental hygiene including fluoride rinses.   He knows that any periodontist /oral surgeon should contact me if they want to perform extractions in the future so they have good understanding of where his radiation dose has been deposited in his mouth  5) Thyroid function: Continue to check annually in medical oncology or with PCP. Lab Results  Component Value Date   TSH 1.75 04/13/2021    6) We discussed over-the-counter supplements for his dry mouth including XyliMelts, Biotene products, and various lozenges   7) I will see him back on an as-needed basis.  He will continue to follow in otolaryngology and medical oncology.  I wished him the very best.    On date of service, in total, I spent 30 minutes on this encounter. Patient was seen in person. _____________________________________   Eppie Gibson, MD

## 2022-01-07 NOTE — Progress Notes (Signed)
Jeffrey [name] presents today for follow-up after completing radiation to his oropharynx on 12/29/2020  Pain issues, if any: Patient denies Using a feeding tube?: N/A--removed 04/19/2022 Weight changes, if any:  Wt Readings from Last 3 Encounters:  01/07/22 163 lb 8 oz (74.2 kg)  11/30/21 155 lb (70.3 kg)  08/19/21 166 lb 11.2 oz (75.6 kg)   Swallowing issues, if any: Denies. Reports he has a healthy appetite and can eat/drink a wide variety Smoking or chewing tobacco? None Using fluoride trays daily? N/A--reports he sees a periodontist every 4 months Last ENT visit was on: 07/12/2021 Saw Dr. Leta Baptist    Reports he's scheduled to see Dr. Benjamine Mola again on 01/17/22  Other notable issues, if any: Last saw medical oncologist Dr. Chryl Heck on 08/19/2021 He is here for follow up. He is doing well. No complaints. No concerns for recurrence on Physical exam. Continue surveillance with Dr Benjamine Mola as recommended RTC with [place] in 6 months TSH will be done Oct 2023.  Continues to deal with dry mouth and thick saliva. Denies any ear or jaw pain, or difficulty opening his mouth. Denies any concerns of swelling under his chin/down his neck. Reports he's diligent about doing his PT stretches and exercises. Overall reports he feels good and is doing well

## 2022-01-10 ENCOUNTER — Other Ambulatory Visit: Payer: Self-pay

## 2022-01-12 NOTE — Telephone Encounter (Signed)
Rx request sent to pharmacy.  

## 2022-01-17 DIAGNOSIS — Z85819 Personal history of malignant neoplasm of unspecified site of lip, oral cavity, and pharynx: Secondary | ICD-10-CM | POA: Diagnosis not present

## 2022-01-18 ENCOUNTER — Telehealth: Payer: Self-pay | Admitting: Hematology and Oncology

## 2022-01-18 NOTE — Telephone Encounter (Signed)
Rescheduled appointment per providers. Patient aware.  ? ?

## 2022-02-17 ENCOUNTER — Inpatient Hospital Stay: Payer: Medicare Other | Attending: Hematology and Oncology

## 2022-02-17 ENCOUNTER — Inpatient Hospital Stay: Payer: Medicare Other | Admitting: Hematology and Oncology

## 2022-02-17 ENCOUNTER — Encounter: Payer: Self-pay | Admitting: Hematology and Oncology

## 2022-02-17 ENCOUNTER — Other Ambulatory Visit: Payer: Self-pay

## 2022-02-17 VITALS — BP 138/73 | HR 60 | Temp 97.8°F | Resp 16 | Ht 69.0 in | Wt 161.1 lb

## 2022-02-17 DIAGNOSIS — C109 Malignant neoplasm of oropharynx, unspecified: Secondary | ICD-10-CM

## 2022-02-17 DIAGNOSIS — Z923 Personal history of irradiation: Secondary | ICD-10-CM | POA: Diagnosis not present

## 2022-02-17 DIAGNOSIS — Z9221 Personal history of antineoplastic chemotherapy: Secondary | ICD-10-CM | POA: Diagnosis not present

## 2022-02-17 DIAGNOSIS — Z85818 Personal history of malignant neoplasm of other sites of lip, oral cavity, and pharynx: Secondary | ICD-10-CM | POA: Insufficient documentation

## 2022-02-17 DIAGNOSIS — Z79899 Other long term (current) drug therapy: Secondary | ICD-10-CM | POA: Diagnosis not present

## 2022-02-17 LAB — CBC WITH DIFFERENTIAL/PLATELET
Abs Immature Granulocytes: 0.04 10*3/uL (ref 0.00–0.07)
Basophils Absolute: 0 10*3/uL (ref 0.0–0.1)
Basophils Relative: 1 %
Eosinophils Absolute: 0 10*3/uL (ref 0.0–0.5)
Eosinophils Relative: 0 %
HCT: 28.3 % — ABNORMAL LOW (ref 39.0–52.0)
Hemoglobin: 10.1 g/dL — ABNORMAL LOW (ref 13.0–17.0)
Immature Granulocytes: 1 %
Lymphocytes Relative: 11 %
Lymphs Abs: 0.5 10*3/uL — ABNORMAL LOW (ref 0.7–4.0)
MCH: 32.7 pg (ref 26.0–34.0)
MCHC: 35.7 g/dL (ref 30.0–36.0)
MCV: 91.6 fL (ref 80.0–100.0)
Monocytes Absolute: 0.8 10*3/uL (ref 0.1–1.0)
Monocytes Relative: 19 %
Neutro Abs: 2.8 10*3/uL (ref 1.7–7.7)
Neutrophils Relative %: 68 %
Platelets: 141 10*3/uL — ABNORMAL LOW (ref 150–400)
RBC: 3.09 MIL/uL — ABNORMAL LOW (ref 4.22–5.81)
RDW: 13.7 % (ref 11.5–15.5)
WBC: 4 10*3/uL (ref 4.0–10.5)
nRBC: 0 % (ref 0.0–0.2)

## 2022-02-17 LAB — COMPREHENSIVE METABOLIC PANEL
ALT: 12 U/L (ref 0–44)
AST: 18 U/L (ref 15–41)
Albumin: 4.4 g/dL (ref 3.5–5.0)
Alkaline Phosphatase: 78 U/L (ref 38–126)
Anion gap: 5 (ref 5–15)
BUN: 17 mg/dL (ref 8–23)
CO2: 27 mmol/L (ref 22–32)
Calcium: 9.7 mg/dL (ref 8.9–10.3)
Chloride: 97 mmol/L — ABNORMAL LOW (ref 98–111)
Creatinine, Ser: 1.06 mg/dL (ref 0.61–1.24)
GFR, Estimated: >60 mL/min (ref >60)
Glucose, Bld: 95 mg/dL (ref 70–99)
Potassium: 4 mmol/L (ref 3.5–5.1)
Sodium: 129 mmol/L — ABNORMAL LOW (ref 135–145)
Total Bilirubin: 0.8 mg/dL (ref 0.3–1.2)
Total Protein: 7.8 g/dL (ref 6.5–8.1)

## 2022-02-17 NOTE — Progress Notes (Signed)
[place]   Telephone:([phone]) [phone] Fax:([phone]) [phone]   Clinic Follow up Note   Patient Care Team: Vivi Barrack, MD as PCP - General (Family Medicine) Buford Dresser, MD as PCP - Cardiology (Cardiology) Chevis Pretty (Inactive) as Consulting Physician (Dentistry) [name], MD as Consulting Physician (Vascular Surgery) Warren Danes, PA-C as Physician Assistant (Dermatology) [place], Stephani Police, RN as Oncology Nurse Navigator Eppie Gibson, MD as Consulting Physician (Radiation Oncology) Leta Baptist, MD as Consulting Physician (Otolaryngology) Benay Pike, MD as Consulting Physician (Hematology and Oncology) Edythe Clarity, [facility] as Pharmacist (Pharmacist) Lavonna Monarch, MD as Consulting Physician (Dermatology)  Date of Service:  02/17/2022  CHIEF COMPLAINT: f/u of oropharyngeal cancer after completion of chemoradiation.  SUMMARY OF ONCOLOGIC HISTORY: Oncology History Overview Note  Patient first noticed to have neck mass about 2 months prior to presentation. He denies any complaints except for neck mass, no difficulty swallowing, pain at the base of the tongue. He went to see his PCP who recommended an [place] which demonstrated hypoechoic mass at the right level II a highly concerning for an enlarged pathologic LN. He was referred to ENT and CT neck was ordered. CT neck showed enlarged right level 2 node, asymmetric soft tissue at the right tongue base, favored to reflect asymmetric tonsillar issue. 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 PET CT showed primary mucosal neoplasm in the right tongue base area demonstrating hypermetabolism.    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HCC)  10/21/2020 Initial Diagnosis   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[place])   10/21/2020 Cancer Staging   Staging form: Pharynx - HPV-Mediated Oropharynx, AJCC 8th  Edition - Clinical stage from 10/21/2020: Stage I (cT1, cN1, cM0, p16+) - Signed by Benay Pike, MD on 10/21/2020 Stage prefix: Initial diagnosis   11/10/2020 - 12/24/2020 Chemotherapy   Patient is on Treatment Plan : HEAD/NECK Cisplatin q7d       CURRENT THERAPY:  None, completed chemotherapy. Surveillance  INTERVAL HISTORY:   Trevione Wert is here for a follow up of oropharyngeal cancer. He completed radiation on 12/29/2020.  He had 4 cycles of chemo, dose reduced, received a total dose of 100 mg per metered square. He couldn't tolerate any further chemotherapy End of treatment PET with complete response.  Since last visit, he has seen Dr. Isidore Moos on January 07, 2022, no evidence of disease. He last saw Dr Benjamine Mola a month ago, per patient, no disease. He still has some dry mouth, can swallow every thing. Weight is maintaining, may have lost a couple pounds, but eating well. No neuropathy, hearing changes.  Rest of the pertinent 10 point ROS reviewed and negative.  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   Right Buccal Cheek([facility])   Osteopenia     SURGICAL HISTORY: Past Surgical History:  Procedure Laterality Date   ABDOMINAL AORTIC ENDOVASCULAR FENESTRATED STENT GRAFT N/A 03/21/2018   Procedure: ABDOMINAL AORTIC ENDOVASCULAR FENESTRATED STENT GRAFT ; [place] CT PERFORMED;  Surgeon: [name], MD;  Location: [place];  Service: Vascular;  Laterality: N/A;  Basal Skin Cancer  2012   CATARACT EXTRACTION Bilateral    IR GASTROSTOMY TUBE MOD SED  11/06/2020   IR GASTROSTOMY TUBE REMOVAL  04/19/2021   IR IMAGING GUIDED PORT INSERTION  11/06/2020   IR REMOVAL TUN ACCESS W/ PORT W/O FL  MOD SED  07/15/2021   MASS BIOPSY Right 09/25/2020   Procedure: OPEN NECK MASS BIOPSY;  Surgeon: Leta Baptist, MD;  Location: [place];  Service: ENT;  Laterality: Right;   MASS EXCISION     x 2; throat and above lip   TONSILLECTOMY     TRANSURETHRAL RESECTION OF PROSTATE      ALLERGIES:  is allergic to ketotifen fumarate, mixed ragweed, and triamcinolone.  MEDICATIONS:  Current Outpatient Medications  Medication Sig Dispense Refill   amLODipine (NORVASC) 5 MG tablet TAKE 1 TABLET(5 MG) BY MOUTH DAILY 90 tablet 1   amLODipine (NORVASC) 5 MG tablet Take 1 tablet (5 mg total) by mouth daily. 90 tablet 3   ARTIFICIAL TEAR SOLUTION OP Place 1 drop into both eyes daily as needed (dry eyes).     Ascorbic Acid (VITAMIN C) 1000 MG tablet Take 1,000 mg by mouth daily.     atorvastatin (LIPITOR) 40 MG tablet Take 1 tablet (40 mg total) by mouth daily at 6 PM. 90 tablet 3   Clobetasol Prop Emollient Base (CLOBETASOL PROPIONATE E) 0.05 % emollient cream Apply 1 application. topically daily. 30 g 0   Cyanocobalamin 1000 MCG TBCR Take 1,000 mcg by mouth daily.     Emollient (CERAVE) LOTN Apply 1 application topically daily as needed (after shower care).      ferrous sulfate 325 (65 FE) MG tablet Take 325 mg by mouth every other day.     fluticasone (FLONASE) 50 MCG/ACT nasal spray Place 1 spray into both nostrils every other day.     GLUCOSAMINE-CHONDROITIN PO Take 1 tablet by mouth 2 (two) times daily.      losartan (COZAAR) 25 MG tablet Take 1 tablet (25 mg total) by mouth 2 (two) times daily. 180 tablet 3   No current facility-administered medications for this visit.    PHYSICAL EXAMINATION: ECOG PERFORMANCE STATUS: 2 - Symptomatic, <50% confined to bed  Vitals:   02/17/22 1301  BP: 138/73  Pulse: 60  Resp: 16  Temp: 97.8 F (36.6 C)  SpO2: 100%    Physical Exam Constitutional:      Appearance: Normal appearance.  HENT:     Head: Normocephalic and atraumatic.      Mouth/Throat:     Comments: No tonsillar enlargement. Cardiovascular:     Rate and Rhythm: Normal rate and regular rhythm.  Pulmonary:     Effort: Pulmonary effort is normal.     Breath sounds: Normal breath sounds.  Abdominal:     General: Abdomen is flat. Bowel sounds are normal.  Musculoskeletal:     Cervical back: Normal range of motion and neck supple.  Neurological:     General: No focal deficit present.     Mental Status: He is alert.  Psychiatric:        Mood and Affect: Mood normal.      LABORATORY DATA:  I have reviewed the data as listed    Latest Ref Rng & Units 02/17/2022   12:09 PM 08/19/2021   12:24 PM 08/19/2021   12:23 PM  CBC  WBC 4.0 - 10.5 K/uL 4.0   4.5   Hemoglobin 13.0 - 17.0 g/dL 10.1   9.6  Hematocrit 39.0 - 52.0 % 28.3  29.0  27.7   Platelets 150 - 400 K/uL 141   132         Latest Ref Rng & Units 02/17/2022   12:09 PM 08/19/2021   12:23 PM 07/07/2021   11:18 AM  CMP  Glucose 70 - 99 mg/dL 95  87  89   BUN 8 - 23 mg/dL '17  19  28   '$ Creatinine 0.61 - 1.24 mg/dL 1.06  1.01  1.12   Sodium 135 - 145 mmol/L 129  128  130   Potassium 3.5 - 5.1 mmol/L 4.0  4.1  4.3   Chloride 98 - 111 mmol/L 97  97  97   CO2 22 - 32 mmol/L '27  24  25   '$ Calcium 8.9 - 10.3 mg/dL 9.7  9.6  9.9   Total Protein 6.5 - 8.1 g/dL 7.8  7.3    Total Bilirubin 0.3 - 1.2 mg/dL 0.8  0.8    Alkaline Phos 38 - 126 U/L 78  64    AST 15 - 41 U/L 18  18    ALT 0 - 44 U/L 12  13      RADIOGRAPHIC STUDIES: I have personally reviewed the radiological images as listed and agreed with the findings in the report. No results found.   ASSESSMENT & PLAN:   Casmir Auguste is a 81 y.o. male with   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[place]), [id number], HPV(+) CT neck showed enlarged right level 2 node, asymmetric soft tissue at the right tongue base, favored to reflect asymmetric tonsillar issue. -Initial biopsy was inconclusive so he had an excisional biopsy which showed P16 positive right  neck poorly differentiated SCC. -PET CT showed primary mucosal neoplasm in the right tongue base area demonstrating hypermetabolism. -He was started on concurrent chemoradiation therapy with cisplatin on 11/09/20. -He developed worsening thrombocytopenia, anemia and leukopenia following cycle 2. Chemo has been held since 11/19/20. -He overall got 100 mg per metered square of cisplatin, multiple delays due to cytopenias in the last 2 cycles, he only got 20 mg per metered square.   End of treatment PET with no concern for residual disease.   -No concern for recurrence on exam today.  He will continue surveillance with ENT.  He will return to clinic with [place] in 1 year or sooner as needed.  2. Chemotherapy-induced cytopenia Hb improving, thrombocytopenia improving. We can continue to monitor.  No intervention needed  #3.  Weight loss, last weight July 2023 was 163 pounds, he may have lost about 2 pounds since his last visit.  He is however able to eat well, swallow everything well.  We .,  He may have lost about 2 pounds since his last visit.  He is however able to eat well, swallow everything wellcan continue to monitor this.  #4, TSH in October 2022 is normal, repeat recommended in 1 year.  This was not drawn on his labs from today hence have recommended that he get it tested at his PCPs office.  I have also sent an in basket message to his primary care physician if they can monitor his TSH annually.   Plan:  -Completed chemotherapy and radiation, not very well-tolerated. RTC in 59yr.    PBenay Pike MD 02/17/2022

## 2022-02-18 ENCOUNTER — Telehealth: Payer: Self-pay | Admitting: Hematology and Oncology

## 2022-02-18 ENCOUNTER — Ambulatory Visit: Payer: Medicare Other | Admitting: Hematology and Oncology

## 2022-02-18 ENCOUNTER — Other Ambulatory Visit: Payer: Medicare Other

## 2022-02-18 ENCOUNTER — Encounter: Payer: Self-pay | Admitting: Hematology and Oncology

## 2022-02-18 NOTE — Telephone Encounter (Signed)
Scheduled appt per 9/1 sch msg. Left msg with appt date/time.

## 2022-03-14 ENCOUNTER — Encounter: Payer: Self-pay | Admitting: *Deleted

## 2022-03-15 ENCOUNTER — Other Ambulatory Visit: Payer: Self-pay | Admitting: Cardiology

## 2022-03-15 ENCOUNTER — Encounter: Payer: Self-pay | Admitting: Family Medicine

## 2022-03-15 NOTE — Telephone Encounter (Signed)
Rx(s) sent to pharmacy electronically.  

## 2022-03-21 NOTE — Progress Notes (Deleted)
Chronic Care Management Pharmacy Note  03/21/2022 Name:  Jeffrey [name] MRN:  [id number] DOB:  1940-08-19  Summary: Initial visit with PharmD.  BP elevated past few OV.  He has not been checking at home.  Have asked him to start to monitor at home and record a few times per week.  Also having some dry mouth post chemo.  Recommendations/Changes made from today's visit: Recommend Biotene mouthwash at night for dry mouth Monitor BP - could consider dose increase of losartan if BP remains elevated during home monitoring  Plan: FU 6 months PharmD CMA BP check in 2 months   Subjective: Jeffrey [name] is an 81 y.o. year old male who is a primary patient of Vivi Barrack, MD.  The CCM team was consulted for assistance with disease management and care coordination needs.    Engaged with patient by telephone for initial visit in response to provider referral for pharmacy case management and/or care coordination services.   Consent to Services:  The patient was given the following information about Chronic Care Management services today, agreed to services, and gave verbal consent: 1. CCM service includes personalized support from designated clinical staff supervised by the primary care provider, including individualized plan of care and coordination with other care providers 2. 24/7 contact phone numbers for assistance for urgent and routine care needs. 3. Service will only be billed when office clinical staff spend 20 minutes or more in a month to coordinate care. 4. Only one practitioner may furnish and bill the service in a calendar month. 5.The patient may stop CCM services at any time (effective at the end of the month) by phone call to the office staff. 6. The patient will be responsible for cost sharing (co-pay) of up to 20% of the service fee (after annual deductible is met). Patient agreed to services and consent obtained.  Patient Care Team: Vivi Barrack, MD as PCP - General (Family  Medicine) Buford Dresser, MD as PCP - Cardiology (Cardiology) Chevis Pretty (Inactive) as Consulting Physician (Dentistry) [name], MD as Consulting Physician (Vascular Surgery) Warren Danes, PA-C as Physician Assistant (Dermatology) Malmfelt, Stephani Police, RN as Oncology Nurse Navigator Eppie Gibson, MD as Consulting Physician (Radiation Oncology) Leta Baptist, MD as Consulting Physician (Otolaryngology) Benay Pike, MD as Consulting Physician (Hematology and Oncology) Edythe Clarity, [facility] as Pharmacist (Pharmacist) Lavonna Monarch, MD (Inactive) as Consulting Physician (Dermatology)  Recent office visits:  04/13/2021 OV (PCP) Vivi Barrack, MD; no medication changes indicated.   Recent consult visits:  08/23/2021 OV (Dermatology) Lavonna Monarch, MD; Clobetasol Prop Emollient Bas 0.05% cream, test the Rx cream on a little spot for 5 days to see if there is a reaction   08/19/2021 OV (Oncology) Benay Pike, MD; -Completed chemotherapy and radiation, not very well-tolerated. RTC in 6 months.   07/07/2021 OV (Oncology) Benay Pike, MD; -Completed chemotherapy and radiation, not very well-tolerated. Return to clinic in 4 to 6 weeks for lab check and follow up.   07/05/2021 OV (Vascular Surgeon) [name], MD; no medication changes indicated.   06/02/2021 VV (Cardiology) Buford Dresser, MD; no medication changes indicated.   05/28/2021 OV Gertie Fey) Milus Banister, MD; no medication changes indicated.   04/09/2021 OV (Radiation Oncology) Eppie Gibson, MD; no medication changes indicated.   03/03/2021 OV (Cardiology) Buford Dresser, MD; no medication changes indicated.   Hospital visits:  None in previous 6 months   Objective:  Lab Results  Component Value Date  CREATININE 1.06 02/17/2022   BUN 17 02/17/2022   GFR 71.21 05/11/2021   GFRNONAA >60 02/17/2022   GFRAA 75 02/03/2020   NA 129 (L) 02/17/2022   K 4.0  02/17/2022   CALCIUM 9.7 02/17/2022   CO2 27 02/17/2022   GLUCOSE 95 02/17/2022    Lab Results  Component Value Date/Time   HGBA1C 5.2 04/13/2021 02:19 PM   HGBA1C 4.7 04/09/2020 02:17 PM   GFR 71.21 05/11/2021 01:44 PM   GFR 68.77 04/13/2021 02:19 PM    Last diabetic Eye exam: No results found for: "HMDIABEYEEXA"  Last diabetic Foot exam: No results found for: "HMDIABFOOTEX"   Lab Results  Component Value Date   CHOL 126 04/13/2021   HDL 71.50 04/13/2021   LDLCALC 44 04/13/2021   TRIG 53.0 04/13/2021   CHOLHDL 2 04/13/2021       Latest Ref Rng & Units 02/17/2022   12:09 PM 08/19/2021   12:23 PM 05/11/2021    1:44 PM  Hepatic Function  Total Protein 6.5 - 8.1 g/dL 7.8  7.3  7.2   Albumin 3.5 - 5.0 g/dL 4.4  4.4  4.5   AST 15 - 41 U/L '18  18  22   ' ALT 0 - 44 U/L '12  13  12   ' Alk Phosphatase 38 - 126 U/L 78  64  56   Total Bilirubin 0.3 - 1.2 mg/dL 0.8  0.8  0.9     Lab Results  Component Value Date/Time   TSH 1.75 04/13/2021 02:19 PM   TSH 1.54 04/09/2020 02:17 PM       Latest Ref Rng & Units 02/17/2022   12:09 PM 08/19/2021   12:24 PM 08/19/2021   12:23 PM  CBC  WBC 4.0 - 10.5 K/uL 4.0   4.5   Hemoglobin 13.0 - 17.0 g/dL 10.1   9.6   Hematocrit 39.0 - 52.0 % 28.3  29.0  27.7   Platelets 150 - 400 K/uL 141   132     No results found for: "VD25OH"  Clinical ASCVD: Yes  The ASCVD Risk score (Arnett DK, et al., 2019) failed to calculate for the following reasons:   The 2019 ASCVD risk score is only valid for ages 14 to 67       04/12/2021   11:12 AM 04/06/2020    2:05 PM 03/05/2019    9:34 AM  Depression screen PHQ 2/9  Decreased Interest 0 0 0  Down, Depressed, Hopeless 0 0 0  PHQ - 2 Score 0 0 0     Social History   Tobacco Use  Smoking Status Former   Packs/day: 1.00   Years: 32.00   Total pack years: 32.00   Types: Cigarettes   Quit date: 08/17/1990   Years since quitting: 31.6  Smokeless Tobacco Never   BP Readings from Last 3 Encounters:   02/17/22 138/73  01/07/22 (!) 148/72  11/30/21 136/79   Pulse Readings from Last 3 Encounters:  02/17/22 60  01/07/22 (!) 58  11/30/21 60   Wt Readings from Last 3 Encounters:  02/17/22 161 lb 1.6 oz (73.1 kg)  01/07/22 163 lb 8 oz (74.2 kg)  11/30/21 155 lb (70.3 kg)   BMI Readings from Last 3 Encounters:  02/17/22 23.79 kg/m  01/07/22 24.14 kg/m  11/30/21 22.89 kg/m    Assessment/Interventions: Review of patient past medical history, allergies, medications, health status, including review of consultants reports, laboratory and other test data, was performed as part of comprehensive  evaluation and provision of chronic care management services.   SDOH:  (Social Determinants of Health) assessments and interventions performed: Yes  Financial Resource Strain: Low Risk  (04/12/2021)   Overall Financial Resource Strain (CARDIA)    Difficulty of Paying Living Expenses: Not hard at all   Food Insecurity: No Food Insecurity (04/12/2021)   Hunger Vital Sign    Worried About Running Out of Food in the Last Year: Never true    Ran Out of Food in the Last Year: Never true    [place]: No Food Insecurity (04/12/2021)  Housing: Low Risk  (04/12/2021)  Transportation Needs: No Transportation Needs (04/12/2021)  Depression (PHQ2-9): Low Risk  (04/12/2021)  Financial Resource Strain: Low Risk  (04/12/2021)  Physical Activity: Inactive (04/12/2021)  Social Connections: Moderately Isolated (04/12/2021)  Stress: No Stress Concern Present (04/12/2021)  Tobacco Use: Medium Risk (02/17/2022)    CCM Care Plan  Allergies  Allergen Reactions   Ketotifen Fumarate Swelling and Other (See Comments)    Eye swelling   Mixed Ragweed     Hay fever   Triamcinolone Hives and Rash    Medications Reviewed Today     Reviewed by Benay Pike, MD (Physician) on 02/17/22 at 1359  Med List Status: <None>   Medication Order Taking? Sig Documenting Provider Last Dose  Status Informant  amLODipine (NORVASC) 5 MG tablet [id number]  TAKE 1 TABLET(5 MG) BY MOUTH DAILY Buford Dresser, MD  Active   amLODipine (NORVASC) 5 MG tablet [id number] No Take 1 tablet (5 mg total) by mouth daily. Buford Dresser, MD Taking Active   ARTIFICIAL TEAR SOLUTION OP [id number] No Place 1 drop into both eyes daily as needed (dry eyes). [provider] Taking Active Self  Ascorbic Acid (VITAMIN C) 1000 MG tablet [id number] No Take 1,000 mg by mouth daily. [provider] Taking Active   atorvastatin (LIPITOR) 40 MG tablet [id number] No Take 1 tablet (40 mg total) by mouth daily at 6 PM. Buford Dresser, MD Taking Active   Clobetasol Prop Emollient Base (CLOBETASOL PROPIONATE E) 0.05 % emollient cream [id number] No Apply 1 application. topically daily. Lavonna Monarch, MD Taking Active   Cyanocobalamin 1000 MCG TBCR [id number] No Take 1,000 mcg by mouth daily. [provider] Taking Active            Med Note Marlene Bast Mar 08, 2018  2:12 PM)    Emollient [facility]) [place] [id number] No Apply 1 application topically daily as needed (after shower care).  [provider] Taking Active Self  ferrous sulfate 325 (65 FE) MG tablet [id number] No Take 325 mg by mouth every other day. [provider] Taking Active Self  fluticasone (FLONASE) 50 MCG/ACT nasal spray [id number] No Place 1 spray into both nostrils every other day. [provider] Taking Active   GLUCOSAMINE-CHONDROITIN PO [id number] No Take 1 tablet by mouth 2 (two) times daily.  [provider] Taking Active Self           Med Note Modena Nunnery, Aldona Bar T   Thu Mar 08, 2018  2:12 PM)    losartan (COZAAR) 25 MG tablet [id number]  Take 1 tablet (25 mg total) by mouth 2 (two) times daily. 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   Hyperglycemia 04/09/2020   Chronic neck pain 04/09/2020   Coronary artery disease due to calcified coronary lesion 12/27/2018   Osteopenia 02/24/2017   Abdominal aortic aneurysm (AAA) without rupture ([place]) 07/06/2015   Basal cell carcinoma 07/06/2015   Essential hypertension 07/06/2015   History of colon polyps 07/06/2015   HLD (hyperlipidemia) 07/06/2015    Immunization History  Administered Date(s) Administered   Fluad Quad(high Dose 65+) 03/26/2019, 04/09/2020, 04/13/2021   Influenza, High Dose Seasonal PF 04/26/2017, 05/05/2018   PFIZER Comirnaty(Gray Top)Covid-19 Tri-Sucrose Vaccine 10/26/2020   PFIZER(Purple Top)SARS-COV-2 Vaccination 07/01/2019, 07/22/2019, 04/24/2020   Pneumococcal Conjugate-13 09/24/2014   Pneumococcal Polysaccharide-23 04/26/2017   Tdap 05/05/2018   Zoster Recombinat (Shingrix) 03/09/2019, 05/14/2019    Conditions to be addressed/monitored:  HTN, CAD, Osteopenia, HLD, Cancer  There are no care plans that you recently modified to display for this patient.     Medication Assistance: None required.  Patient affirms current coverage meets needs.  Compliance/Adherence/Medication fill history: Care Gaps: None  Star-Rating Drugs: Losartan 25 mg last filled 06/16/2021 90 DS Atorvastatin 5 mg last filled 06/27/2021 90 DS  Patient's preferred pharmacy is:  [id number] [address], [address] AT [facility] [address] [place] [address] Phone: [phone] Fax: [phone]  [facility] [address] [place] [address] Phone: [phone] Fax: [id number] - [place] -  [address] AT [place] [address] [place] [address] Phone: [phone] Fax: [phone]  Uses pill box? No - few meds does not need Pt endorses 100% compliance  We discussed: Benefits of medication synchronization, packaging and delivery as well as enhanced pharmacist oversight with [employer]. Patient decided to: Continue current medication management strategy  Care Plan and Follow Up Patient Decision:  Patient agrees to Care Plan and Follow-up.  Plan: The care management team will reach out to the patient again over the next 180 days.  Beverly Milch, PharmD Clinical Pharmacist  [facility] [phone]    Current Barriers:  Elevated BP  Pharmacist Clinical Goal(s):  Patient will achieve improvement in BP as evidenced by monitoring at home through collaboration with PharmD and provider.   Interventions: 1:1 collaboration with Vivi Barrack, MD regarding development and update of comprehensive plan of care as evidenced by provider attestation and co-signature Inter-disciplinary care team collaboration (see longitudinal plan of care) Comprehensive medication review performed; medication list updated in electronic medical record  Hypertension (BP goal <140/90) -Not ideally controlled -Current treatment: Amlodipine 63m daily Appropriate, Query effective, ,  Losartan 276mdaily Appropriate, Query effective, ,  -Medications previously tried: none notedf  -Current home readings: not checking as much lately, no home readings to discuss -Current dietary habits: getting better each day after chemo, taste buds starting to come back -Current exercise habits: some walking, limited by back pain -Denies hypotensive/hypertensive symptoms -Educated on BP goals and benefits of medications for prevention of heart attack, stroke and kidney damage; Exercise goal of 150 minutes per week; Importance of home blood pressure  monitoring; Symptoms of hypotension and importance of maintaining adequate hydration; -Counseled to monitor BP at home a few times  per week, document, and provide log at future appointments -Recommended to continue current medication Monitor BP at home, will have CMA call and check in a month or so to see how BP is running.  Room to increase doses of losartan and amlodipine if need be to keep BP at goal.  Hyperlipidemia/CAD: (LDL goal < 70) -Controlled -Current treatment: Atorvastatin 81m Appropriate, Effective, Safe, Accessible -Medications previously tried: none noted  -Current dietary patterns: see HTN -Current exercise habits: see HTn -Educated on Cholesterol goals;  Benefits of statin for ASCVD risk reduction; Importance of limiting foods high in cholesterol; -Recommended to continue current medication LDL controlled, adherent to med, no adverse effects  Osteopenia (Goal Prevent fractures/falls) -Controlled  -Current treatment  None -Medications previously tried: none noted  -Recommend [phone] units of vitamin D daily. Recommend 1200 mg of calcium daily from dietary and supplemental sources. Recommend weight-bearing and muscle strengthening exercises for building and maintaining bone density. -Recommend continue current management, no changes at this time,  Cancer (Goal: Continue in remission) -Controlled -Current treatment  None -Medications previously tried: none noted -He is currently having some severe dry mouth and thick saliva.  This is his biggest concern post chemo.  He will swish with water sometimes and this helps.  Waking up at night feeling like lips and gums are stuck together.  -Recommended he could try Biotene mouthwash at night before bed to see if this helps with symptoms.  Patient agreeable to try.  Patient Goals/Self-Care Activities Patient will:  - focus on medication adherence by pill counts check blood pressure a few times per week, document, and  provide at future appointments  Follow Up Plan: The care management team will reach out to the patient again over the next 180 days.

## 2022-03-28 ENCOUNTER — Telehealth: Payer: Medicare Other

## 2022-04-14 ENCOUNTER — Ambulatory Visit (INDEPENDENT_AMBULATORY_CARE_PROVIDER_SITE_OTHER): Payer: Medicare Other | Admitting: Family Medicine

## 2022-04-14 ENCOUNTER — Encounter: Payer: Self-pay | Admitting: Family Medicine

## 2022-04-14 VITALS — BP 138/77 | HR 57 | Temp 98.4°F | Ht 69.0 in | Wt 162.0 lb

## 2022-04-14 DIAGNOSIS — I10 Essential (primary) hypertension: Secondary | ICD-10-CM

## 2022-04-14 DIAGNOSIS — Z23 Encounter for immunization: Secondary | ICD-10-CM | POA: Diagnosis not present

## 2022-04-14 DIAGNOSIS — Z0001 Encounter for general adult medical examination with abnormal findings: Secondary | ICD-10-CM | POA: Diagnosis not present

## 2022-04-14 DIAGNOSIS — R739 Hyperglycemia, unspecified: Secondary | ICD-10-CM

## 2022-04-14 DIAGNOSIS — E785 Hyperlipidemia, unspecified: Secondary | ICD-10-CM

## 2022-04-14 LAB — HEMOGLOBIN A1C: Hgb A1c MFr Bld: 4.9 % (ref 4.6–6.5)

## 2022-04-14 LAB — LIPID PANEL
Cholesterol: 123 mg/dL (ref 0–200)
HDL: 70.2 mg/dL (ref >39.00)
LDL Cholesterol: 45 mg/dL (ref 0–99)
NonHDL: 52.49
Total CHOL/HDL Ratio: 2
Triglycerides: 38 mg/dL (ref 0.0–149.0)
VLDL: 7.6 mg/dL (ref 0.0–40.0)

## 2022-04-14 LAB — TSH: TSH: 2.93 u[IU]/mL (ref 0.35–5.50)

## 2022-04-14 NOTE — Assessment & Plan Note (Signed)
Check A1c. 

## 2022-04-14 NOTE — Assessment & Plan Note (Signed)
On Lipitor 40 mg daily.  Check labs.

## 2022-04-14 NOTE — Assessment & Plan Note (Addendum)
At goal on amlodipine 5 mg daily and losartan 25 mg twice daily

## 2022-04-14 NOTE — Patient Instructions (Signed)
It was very nice to see you today!  We will give your flu shot today.  We will check blood work.  I will see back in year for your next physical.  Come back sooner if needed.  Take care, Dr [name]  PLEASE NOTE:  If you had any lab tests please let us know if you have not heard back within a few days. You may see your results on mychart before we have a chance to review them but we will give you a call once they are reviewed by [place]. If we ordered any referrals today, please let us know if you have not heard from their office within the next week.   Please try these tips to maintain a healthy lifestyle:  Eat at least 3 REAL meals and 1-2 snacks per day.  Aim for no more than 5 hours between eating.  If you eat breakfast, please do so within one hour of getting up.   Each meal should contain half fruits/vegetables, one quarter protein, and one quarter carbs (no bigger than a computer mouse)  Cut down on sweet beverages. This includes juice, soda, and sweet tea.   Drink at least 1 glass of water with each meal and aim for at least 8 glasses per day  Exercise at least 150 minutes every week.    Preventive Care 32 Years and Older, Male Preventive care refers to lifestyle choices and visits with your health care provider that can promote health and wellness. Preventive care visits are also called wellness exams. What can I expect for my preventive care visit? Counseling During your preventive care visit, your health care provider may ask about your: Medical history, including: Past medical problems. Family medical history. History of falls. Current health, including: Emotional well-being. Home life and relationship well-being. Sexual activity. Memory and ability to understand (cognition). Lifestyle, including: Alcohol, nicotine or tobacco, and drug use. Access to firearms. Diet, exercise, and sleep habits. Work and work Statistician. Sunscreen use. Safety issues such as seatbelt  and bike helmet use. Physical exam Your health care provider will check your: Height and weight. These may be used to calculate your BMI (body mass index). BMI is a measurement that tells if you are at a healthy weight. Waist circumference. This measures the distance around your waistline. This measurement also tells if you are at a healthy weight and may help predict your risk of certain diseases, such as type 2 diabetes and high blood pressure. Heart rate and blood pressure. Body temperature. Skin for abnormal spots. What immunizations do I need?  Vaccines are usually given at various ages, according to a schedule. Your health care provider will recommend vaccines for you based on your age, medical history, and lifestyle or other factors, such as travel or where you work. What tests do I need? Screening Your health care provider may recommend screening tests for certain conditions. This may include: Lipid and cholesterol levels. Diabetes screening. This is done by checking your blood sugar (glucose) after you have not eaten for a while (fasting). Hepatitis C test. Hepatitis B test. HIV (human immunodeficiency virus) test. STI (sexually transmitted infection) testing, if you are at risk. Lung cancer screening. Colorectal cancer screening. Prostate cancer screening. Abdominal aortic aneurysm (AAA) screening. You may need this if you are a current or former smoker. Talk with your health care provider about your test results, treatment options, and if necessary, the need for more tests. Follow these instructions at home: Eating and drinking  Eat a diet that includes fresh fruits and vegetables, whole grains, lean protein, and low-fat dairy products. Limit your intake of foods with high amounts of sugar, saturated fats, and salt. Take vitamin and mineral supplements as recommended by your health care provider. Do not drink alcohol if your health care provider tells you not to drink. If  you drink alcohol: Limit how much you have to 0-2 drinks a day. Know how much alcohol is in your drink. In the [place] one drink equals one 12 oz bottle of beer (355 mL), one 5 oz glass of wine (148 mL), or one 1 oz glass of hard liquor (44 mL). Lifestyle Brush your teeth every morning and night with fluoride toothpaste. Floss one time each day. Exercise for at least 30 minutes 5 or more days each week. Do not use any products that contain nicotine or tobacco. These products include cigarettes, chewing tobacco, and vaping devices, such as e-cigarettes. If you need help quitting, ask your health care provider. Do not use drugs. If you are sexually active, practice safe sex. Use a condom or other form of protection to prevent STIs. Take aspirin only as told by your health care provider. Make sure that you understand how much to take and what form to take. Work with your health care provider to find out whether it is safe and beneficial for you to take aspirin daily. Ask your health care provider if you need to take a cholesterol-lowering medicine (statin). Find healthy ways to manage stress, such as: Meditation, yoga, or listening to music. Journaling. Talking to a trusted person. Spending time with friends and family. Safety Always wear your seat belt while driving or riding in a vehicle. Do not drive: If you have been drinking alcohol. Do not ride with someone who has been drinking. When you are tired or distracted. While texting. If you have been using any mind-altering substances or drugs. Wear a helmet and other protective equipment during sports activities. If you have firearms in your house, make sure you follow all gun safety procedures. Minimize exposure to UV radiation to reduce your risk of skin cancer. What's next? Visit your health care provider once a year for an annual wellness visit. Ask your health care provider how often you should have your eyes and teeth checked. Stay up  to date on all vaccines. This information is not intended to replace advice given to you by your health care provider. Make sure you discuss any questions you have with your health care provider. Document Revised: 12/02/2020 Document Reviewed: 12/02/2020 Elsevier Patient Education  [place].

## 2022-04-14 NOTE — Progress Notes (Signed)
Chief Complaint:  Jeffrey [name] is a 81 y.o. male who presents today for Jeffrey [name] annual comprehensive physical exam.    Assessment/Plan:  Chronic Problems Addressed Today: HLD (hyperlipidemia) On Lipitor 40 mg daily.  Check labs.  Essential hypertension At goal on amlodipine 5 mg daily and losartan 25 mg twice daily  Hyperglycemia Check A1c.  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  Has yearly surveillance.  No signs remission.  Preventative Healthcare: Flu shot given today.  Check labs.  Patient Counseling(The following topics were reviewed and/or handout was given):  -Nutrition: Stressed importance of moderation in sodium/caffeine intake, saturated fat and cholesterol, caloric balance, sufficient intake of fresh fruits, vegetables, and fiber.  -Stressed the importance of regular exercise.   -Substance Abuse: Discussed cessation/primary prevention of tobacco, alcohol, or other drug use; driving or other dangerous activities under the influence; availability of treatment for abuse.   -Injury prevention: Discussed safety belts, safety helmets, smoke detector, smoking near bedding or upholstery.   -Sexuality: Discussed sexually transmitted diseases, partner selection, use of condoms, avoidance of unintended pregnancy and contraceptive alternatives.   -Dental health: Discussed importance of regular tooth brushing, flossing, and dental visits.  -Health maintenance and immunizations reviewed. Please refer to Health maintenance section.  Return to care in 1 year for next preventative visit.     Subjective:  HPI:  Jeffrey [name] has no acute complaints today.   Lifestyle Diet: Balanced. Plenty of fruit and vegetables.  Exercise: Limited but tries to get in Jeffrey [name] steps.      04/14/2022    1:04 PM  Depression screen PHQ 2/9  Decreased Interest 0  Down, Depressed, Hopeless 0  PHQ - 2 Score 0    Health Maintenance Due  Topic Date Due   INFLUENZA VACCINE  01/18/2022   Medicare  Annual Wellness (AWV)  05/12/2022     ROS: Per HPI, otherwise a complete review of systems was negative.   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   Right Buccal Cheek([facility])   Osteopenia    Patient Active Problem List   Diagnosis Date Noted   Mucositis due to antineoplastic therapy 11/26/2020   Drug induced constipation 11/26/2020   Normocytic normochromic anemia 11/19/2020   Port-A-Cath in place 11/09/2020   History of drug allergy 11/09/2020   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   Hyperglycemia 04/09/2020   Chronic neck pain 04/09/2020   Coronary artery disease due to calcified coronary lesion 12/27/2018   Osteopenia 02/24/2017   Abdominal aortic aneurysm (AAA) without rupture ([place]) 07/06/2015   Basal cell carcinoma 07/06/2015   Essential hypertension 07/06/2015   History of colon polyps 07/06/2015   HLD (hyperlipidemia) 07/06/2015   Past Surgical History:  Procedure Laterality Date   ABDOMINAL AORTIC ENDOVASCULAR FENESTRATED STENT GRAFT N/A 03/21/2018   Procedure: ABDOMINAL AORTIC ENDOVASCULAR FENESTRATED STENT GRAFT ; [facility] PERFORMED;  Surgeon: [name], MD;  Location: [facility];  Service: Vascular;  Laterality: N/A;   Basal Skin Cancer  2012   CATARACT EXTRACTION Bilateral  IR GASTROSTOMY TUBE MOD SED  11/06/2020   IR GASTROSTOMY TUBE REMOVAL  04/19/2021   IR IMAGING GUIDED PORT INSERTION  11/06/2020   IR REMOVAL TUN ACCESS W/ PORT W/O FL MOD SED  07/15/2021   MASS BIOPSY Right 09/25/2020   Procedure: OPEN NECK MASS BIOPSY;  Surgeon: Leta Baptist, MD;  Location: [facility];  Service: ENT;  Laterality: Right;   MASS EXCISION     x 2; throat and above lip   TONSILLECTOMY     TRANSURETHRAL RESECTION OF PROSTATE      Family History  Problem Relation Age of Onset   Diabetes Mother    Hypertension Mother    Stroke Mother    Alcohol abuse Father    Heart attack Father    Heart disease Father    Hypertension Father    Hyperlipidemia Sister    Pancreatic cancer Sister    Pancreatic cancer Brother    Colon cancer Paternal Grandmother 41   Hypertension Son    Esophageal cancer Neg Hx    Stomach cancer Neg Hx    Liver disease Neg Hx     Medications- reviewed and updated Current Outpatient Medications  Medication Sig Dispense Refill   amLODipine (NORVASC) 5 MG tablet TAKE 1 TABLET(5 MG) BY MOUTH DAILY 90 tablet 1   ARTIFICIAL TEAR SOLUTION OP Place 1 drop into both eyes daily as needed (dry eyes).     Ascorbic Acid (VITAMIN C) 1000 MG tablet Take 1,000 mg by mouth daily.     atorvastatin (LIPITOR) 40 MG tablet TAKE 1 TABLET(40 MG) BY MOUTH DAILY AT 6 PM 90 tablet 2   Cyanocobalamin 1000 MCG TBCR Take 1,000 mcg by mouth daily.     Emollient (CERAVE) LOTN Apply 1 application topically daily as needed (after shower care).      ferrous sulfate 325 (65 FE) MG tablet Take 325 mg by mouth every other day.     GLUCOSAMINE-CHONDROITIN PO Take 1 tablet by mouth 2 (two) times daily.      losartan (COZAAR) 25 MG tablet Take 1 tablet (25 mg total) by mouth 2 (two) times daily. 180 tablet 3   No current facility-administered medications for this visit.    Allergies-reviewed and updated Allergies  Allergen Reactions   Ketotifen Fumarate Swelling and Other (See Comments)    Eye swelling   Mixed Ragweed     Hay fever   Triamcinolone Hives and Rash    Social History   Socioeconomic History   Marital status: Divorced    Spouse name: Not on file   Number of children: 1   Years of education: Not on file   Highest  education level: Not on file  Occupational History   Occupation: Retired    Fish farm manager: [employer]  Tobacco Use   Smoking status: Former    Packs/day: 1.00    Years: 32.00    Total pack years: 32.00    Types: Cigarettes    Quit date: 08/17/1990    Years since quitting: 31.6   Smokeless tobacco: Never  Vaping Use   Vaping Use: Never used  Substance and Sexual Activity   Alcohol use: Not Currently    Comment: Occassionally, "when I feel like it"   Drug use: No   Sexual activity: Yes    Partners: Female  Other Topics Concern   Not on file  Social History Narrative   Not on file   Social Determinants of Health   Financial  Resource Strain: Low Risk  (04/12/2021)   Overall Financial Resource Strain (CARDIA)    Difficulty of Paying Living Expenses: Not hard at all  Food Insecurity: No Food Insecurity (04/12/2021)   Hunger Vital Sign    Worried About Running Out of Food in the Last Year: Never true    Ran Out of Food in the Last Year: Never true  Transportation Needs: No Transportation Needs (04/12/2021)   PRAPARE - Hydrologist (Medical): No    Lack of Transportation (Non-Medical): No  Physical Activity: Inactive (04/12/2021)   Exercise Vital Sign    Days of Exercise per Week: 0 days    Minutes of Exercise per Session: 0 min  Stress: No Stress Concern Present (04/12/2021)   [place]    Feeling of Stress : Not at all  Social Connections: Moderately Isolated (04/12/2021)   Social Connection and Isolation Panel [NHANES]    Frequency of Communication with Friends and Family: More than three times a week    Frequency of Social Gatherings with Friends and Family: Once a week    Attends Religious Services: Never    Marine scientist or Organizations: Yes    Attends Archivist Meetings: Never    Marital Status: Divorced        Objective:  Physical Exam: BP  138/77   Pulse (!) 57   Temp 98.4 F (36.9 C) (Temporal)   Ht '5\' 9"'$  (1.753 m)   Wt 162 lb (73.5 kg)   SpO2 100%   BMI 23.92 kg/m   Body mass index is 23.92 kg/m. Wt Readings from Last 3 Encounters:  04/14/22 162 lb (73.5 kg)  02/17/22 161 lb 1.6 oz (73.1 kg)  01/07/22 163 lb 8 oz (74.2 kg)   Gen: NAD, resting comfortably HEENT: TMs normal bilaterally. OP clear. No thyromegaly noted.  CV: RRR with no murmurs appreciated Pulm: NWOB, CTAB with no crackles, wheezes, or rhonchi GI: Normal bowel sounds present. Soft, Nontender, Nondistended. MSK: no edema, cyanosis, or clubbing noted Skin: warm, dry Neuro: CN2-12 grossly intact. Strength 5/5 in upper and lower extremities. Reflexes symmetric and intact bilaterally.  Psych: Normal affect and thought content     Maybell Misenheimer M. [name], MD 04/14/2022 1:26 PM

## 2022-04-18 NOTE — Progress Notes (Signed)
Please inform patient of the following:  Labs are all stable.  Would like for him to keep up the good work and we can recheck in a year.

## 2022-04-25 ENCOUNTER — Ambulatory Visit (INDEPENDENT_AMBULATORY_CARE_PROVIDER_SITE_OTHER): Payer: Medicare Other

## 2022-04-25 VITALS — Wt 162.0 lb

## 2022-04-25 DIAGNOSIS — Z0000 Encounter for general adult medical examination without abnormal findings: Secondary | ICD-10-CM | POA: Diagnosis not present

## 2022-04-25 NOTE — Patient Instructions (Signed)
Jeffrey [name] , Thank you for taking time to come for your Medicare Wellness Visit. I appreciate your ongoing commitment to your health goals. Please review the following plan we discussed and let me know if I can assist you in the future.   These are the goals we discussed:  Goals      Patient Stated     Patient Stated     Live long     Track and Manage My Blood Pressure-Hypertension     Timeframe:  Long-Range Goal Priority:  High Start Date:   09/03/21                          Expected End Date: 03/06/22                      Follow Up Date 12/04/21    - check blood pressure weekly - choose a place to take my blood pressure (home, clinic or office, retail store) - write blood pressure results in a log or diary    Why is this important?   You won't feel high blood pressure, but it can still hurt your blood vessels.  High blood pressure can cause heart or kidney problems. It can also cause a stroke.  Making lifestyle changes like losing a little weight or eating less salt will help.  Checking your blood pressure at home and at different times of the day can help to control blood pressure.  If the doctor prescribes medicine remember to take it the way the doctor ordered.  Call the office if you cannot afford the medicine or if there are questions about it.     Notes:      Weight (lb) < 200 lb (90.7 kg)        This is a list of the screening recommended for you and due dates:  Health Maintenance  Topic Date Due   Medicare Annual Wellness Visit  04/12/2022   COVID-19 Vaccine (5 - Pfizer risk series) 04/30/2022*   Tetanus Vaccine  05/05/2028   Pneumonia Vaccine  Completed   Flu Shot  Completed   Zoster (Shingles) Vaccine  Completed   HPV Vaccine  Aged Out  *Topic was postponed. The date shown is not the original due date.    Advanced directives: Advance directive discussed with you today. Even though you declined this today please call our office should you change your mind  and we can give you the proper paperwork for you to fill out.  Conditions/risks identified: stay healthy and active   Next appointment: Follow up in one year for your annual wellness visit.   Preventive Care 16 Years and Older, Male  Preventive care refers to lifestyle choices and visits with your health care provider that can promote health and wellness. What does preventive care include? A yearly physical exam. This is also called an annual well check. Dental exams once or twice a year. Routine eye exams. Ask your health care provider how often you should have your eyes checked. Personal lifestyle choices, including: Daily care of your teeth and gums. Regular physical activity. Eating a healthy diet. Avoiding tobacco and drug use. Limiting alcohol use. Practicing safe sex. Taking low doses of aspirin every day. Taking vitamin and mineral supplements as recommended by your health care provider. What happens during an annual well check? The services and screenings done by your health care provider during your annual well check will depend  on your age, overall health, lifestyle risk factors, and family history of disease. Counseling  Your health care provider may ask you questions about your: Alcohol use. Tobacco use. Drug use. Emotional well-being. Home and relationship well-being. Sexual activity. Eating habits. History of falls. Memory and ability to understand (cognition). Work and work Statistician. Screening  You may have the following tests or measurements: Height, weight, and BMI. Blood pressure. Lipid and cholesterol levels. These may be checked every 5 years, or more frequently if you are over 28 years old. Skin check. Lung cancer screening. You may have this screening every year starting at age 60 if you have a 30-pack-year history of smoking and currently smoke or have quit within the past 15 years. Fecal occult blood test (FOBT) of the stool. You may have this  test every year starting at age 36. Flexible sigmoidoscopy or colonoscopy. You may have a sigmoidoscopy every 5 years or a colonoscopy every 10 years starting at age 53. Prostate cancer screening. Recommendations will vary depending on your family history and other risks. Hepatitis C blood test. Hepatitis B blood test. Sexually transmitted disease (STD) testing. Diabetes screening. This is done by checking your blood sugar (glucose) after you have not eaten for a while (fasting). You may have this done every 1-3 years. Abdominal aortic aneurysm (AAA) screening. You may need this if you are a current or former smoker. Osteoporosis. You may be screened starting at age 5 if you are at high risk. Talk with your health care provider about your test results, treatment options, and if necessary, the need for more tests. Vaccines  Your health care provider may recommend certain vaccines, such as: Influenza vaccine. This is recommended every year. Tetanus, diphtheria, and acellular pertussis (Tdap, Td) vaccine. You may need a Td booster every 10 years. Zoster vaccine. You may need this after age 81. Pneumococcal 13-valent conjugate (PCV13) vaccine. One dose is recommended after age 46. Pneumococcal polysaccharide (PPSV23) vaccine. One dose is recommended after age 76. Talk to your health care provider about which screenings and vaccines you need and how often you need them. This information is not intended to replace advice given to you by your health care provider. Make sure you discuss any questions you have with your health care provider. Document Released: 07/03/2015 Document Revised: 02/24/2016 Document Reviewed: 04/07/2015 Elsevier Interactive Patient Education  2017 [employer] Prevention in the Home Falls can cause injuries. They can happen to people of all ages. There are many things you can do to make your home safe and to help prevent falls. What can I do on the outside of my  home? Regularly fix the edges of walkways and driveways and fix any cracks. Remove anything that might make you trip as you walk through a door, such as a raised step or threshold. Trim any bushes or trees on the path to your home. Use bright outdoor lighting. Clear any walking paths of anything that might make someone trip, such as rocks or tools. Regularly check to see if handrails are loose or broken. Make sure that both sides of any steps have handrails. Any raised decks and porches should have guardrails on the edges. Have any leaves, snow, or ice cleared regularly. Use sand or salt on walking paths during winter. Clean up any spills in your garage right away. This includes oil or grease spills. What can I do in the bathroom? Use night lights. Install grab bars by the toilet and in the tub  and shower. Do not use towel bars as grab bars. Use non-skid mats or decals in the tub or shower. If you need to sit down in the shower, use a plastic, non-slip stool. Keep the floor dry. Clean up any water that spills on the floor as soon as it happens. Remove soap buildup in the tub or shower regularly. Attach bath mats securely with double-sided non-slip rug tape. Do not have throw rugs and other things on the floor that can make you trip. What can I do in the bedroom? Use night lights. Make sure that you have a light by your bed that is easy to reach. Do not use any sheets or blankets that are too big for your bed. They should not hang down onto the floor. Have a firm chair that has side arms. You can use this for support while you get dressed. Do not have throw rugs and other things on the floor that can make you trip. What can I do in the kitchen? Clean up any spills right away. Avoid walking on wet floors. Keep items that you use a lot in easy-to-reach places. If you need to reach something above you, use a strong step stool that has a grab bar. Keep electrical cords out of the way. Do  not use floor polish or wax that makes floors slippery. If you must use wax, use non-skid floor wax. Do not have throw rugs and other things on the floor that can make you trip. What can I do with my stairs? Do not leave any items on the stairs. Make sure that there are handrails on both sides of the stairs and use them. Fix handrails that are broken or loose. Make sure that handrails are as long as the stairways. Check any carpeting to make sure that it is firmly attached to the stairs. Fix any carpet that is loose or worn. Avoid having throw rugs at the top or bottom of the stairs. If you do have throw rugs, attach them to the floor with carpet tape. Make sure that you have a light switch at the top of the stairs and the bottom of the stairs. If you do not have them, ask someone to add them for you. What else can I do to help prevent falls? Wear shoes that: Do not have high heels. Have rubber bottoms. Are comfortable and fit you well. Are closed at the toe. Do not wear sandals. If you use a stepladder: Make sure that it is fully opened. Do not climb a closed stepladder. Make sure that both sides of the stepladder are locked into place. Ask someone to hold it for you, if possible. Clearly mark and make sure that you can see: Any grab bars or handrails. First and last steps. Where the edge of each step is. Use tools that help you move around (mobility aids) if they are needed. These include: Canes. Walkers. Scooters. Crutches. Turn on the lights when you go into a dark area. Replace any light bulbs as soon as they burn out. Set up your furniture so you have a clear path. Avoid moving your furniture around. If any of your floors are uneven, fix them. If there are any pets around you, be aware of where they are. Review your medicines with your doctor. Some medicines can make you feel dizzy. This can increase your chance of falling. Ask your doctor what other things that you can do to  help prevent falls. This information is not  intended to replace advice given to you by your health care provider. Make sure you discuss any questions you have with your health care provider. Document Released: 04/02/2009 Document Revised: 11/12/2015 Document Reviewed: 07/11/2014 Elsevier Interactive Patient Education  2017 [employer].

## 2022-04-25 NOTE — Progress Notes (Signed)
I connected with  Jeffrey [name] on 04/25/22 by a audio enabled telemedicine application and verified that I am speaking with the correct person using two identifiers.  Patient Location: Home  Provider Location: Office/Clinic  I discussed the limitations of evaluation and management by telemedicine. The patient expressed understanding and agreed to proceed.   Subjective:   Jeffrey [name] is a 81 y.o. male who presents for Medicare Annual/Subsequent preventive examination.  Review of Systems     Cardiac Risk Factors include: advanced age (>87mn, >>80women);hypertension;male gender;dyslipidemia     Objective:    Today's Vitals   04/25/22 1132  Weight: 162 lb (73.5 kg)   Body mass index is 23.92 kg/m.     04/25/2022   11:38 AM 01/07/2022   11:22 AM 05/03/2021   11:14 AM 04/12/2021   11:15 AM 01/11/2021    2:44 PM 12/03/2020   10:31 AM 12/03/2020    8:50 AM  Advanced Directives  Does Patient Have a Medical Advance Directive? No No No Yes No No No  Does patient want to make changes to medical advance directive?    Yes (MAU/Ambulatory/Procedural Areas - Information given)     Would patient like information on creating a medical advance directive? No - Patient declined No - Patient declined No - Patient declined  No - Patient declined No - Patient declined No - Patient declined    Current Medications (verified) Outpatient Encounter Medications as of 04/25/2022  Medication Sig   amLODipine (NORVASC) 5 MG tablet TAKE 1 TABLET(5 MG) BY MOUTH DAILY   ARTIFICIAL TEAR SOLUTION OP Place 1 drop into both eyes daily as needed (dry eyes).   Ascorbic Acid (VITAMIN C) 1000 MG tablet Take 1,000 mg by mouth daily.   atorvastatin (LIPITOR) 40 MG tablet TAKE 1 TABLET(40 MG) BY MOUTH DAILY AT 6 PM   Cyanocobalamin 1000 MCG TBCR Take 1,000 mcg by mouth daily.   Emollient (CERAVE) LOTN Apply 1 application topically daily as needed (after shower care).    ferrous sulfate 325 (65 FE) MG tablet Take  325 mg by mouth every other day.   GLUCOSAMINE-CHONDROITIN PO Take 1 tablet by mouth 2 (two) times daily.    losartan (COZAAR) 25 MG tablet Take 1 tablet (25 mg total) by mouth 2 (two) times daily.   No facility-administered encounter medications on file as of 04/25/2022.    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   Right Buccal Cheek([facility])   Osteopenia    Past Surgical History:  Procedure Laterality Date   ABDOMINAL AORTIC ENDOVASCULAR FENESTRATED STENT GRAFT N/A 03/21/2018   Procedure: ABDOMINAL AORTIC ENDOVASCULAR FENESTRATED STENT GRAFT ; DYNA CT PERFORMED;  Surgeon: BSerafina [name] MD;  Location: [facility] OR;  Service: Vascular;  Laterality: N/A;   Basal Skin Cancer  2012   CATARACT EXTRACTION Bilateral    IR GASTROSTOMY TUBE MOD SED  11/06/2020   IR GASTROSTOMY TUBE REMOVAL  04/19/2021   IR IMAGING GUIDED PORT INSERTION  11/06/2020   IR REMOVAL TUN ACCESS W/ PORT W/O FL MOD SED  07/15/2021   MASS BIOPSY Right 09/25/2020  Procedure: OPEN NECK MASS BIOPSY;  Surgeon: Leta Baptist, MD;  Location: [place];  Service: ENT;  Laterality: Right;   MASS EXCISION     x 2; throat and above lip   TONSILLECTOMY     TRANSURETHRAL RESECTION OF PROSTATE     Family History  Problem Relation Age of Onset   Diabetes Mother    Hypertension Mother    Stroke Mother    Alcohol abuse Father    Heart attack Father    Heart disease Father    Hypertension Father    Hyperlipidemia Sister    Pancreatic cancer Sister    Pancreatic cancer Brother    Colon cancer Paternal Grandmother 8   Hypertension Son    Esophageal  cancer Neg Hx    Stomach cancer Neg Hx    Liver disease Neg Hx    Social History   Socioeconomic History   Marital status: Divorced    Spouse name: Not on file   Number of children: 1   Years of education: Not on file   Highest education level: Not on file  Occupational History   Occupation: Retired    Fish farm manager: [employer]  Tobacco Use   Smoking status: Former    Packs/day: 1.00    Years: 32.00    Total pack years: 32.00    Types: Cigarettes    Quit date: 08/17/1990    Years since quitting: 31.7   Smokeless tobacco: Never  Vaping Use   Vaping Use: Never used  Substance and Sexual Activity   Alcohol use: Not Currently    Comment: Occassionally, "when I feel like it"   Drug use: No   Sexual activity: Yes    Partners: Female  Other Topics Concern   Not on file  Social History Narrative   Not on file   Social Determinants of Health   Financial Resource Strain: Low Risk  (04/25/2022)   Overall Financial Resource Strain (CARDIA)    Difficulty of Paying Living Expenses: Not hard at all  Food Insecurity: No Food Insecurity (04/25/2022)   Hunger Vital Sign    Worried About Running Out of Food in the Last Year: Never true    Ran Out of Food in the Last Year: Never true  Transportation Needs: No Transportation Needs (04/25/2022)   PRAPARE - Hydrologist (Medical): No    Lack of Transportation (Non-Medical): No  Physical Activity: Insufficiently Active (04/25/2022)   Exercise Vital Sign    Days of Exercise per Week: 5 days    Minutes of Exercise per Session: 20 min  Stress: No Stress Concern Present (04/25/2022)   [place]    Feeling of Stress : Not at all  Social Connections: Moderately Isolated (04/25/2022)   Social Connection and Isolation Panel [NHANES]    Frequency of Communication with Friends and Family: Three times a week    Frequency of Social Gatherings with Friends  and Family: Once a week    Attends Religious Services: Never    Marine scientist or Organizations: Yes    Attends Music therapist: 1 to 4 times per year    Marital Status: Divorced    Tobacco Counseling Counseling given: Not Answered   Clinical Intake:  Pre-visit preparation completed: Yes  Pain : No/denies pain     BMI - recorded: 23.92 Nutritional Status: BMI of 19-24  Normal Nutritional Risks: None Diabetes: No  How often do you need to have someone help you when you read instructions, pamphlets, or other written materials from your doctor or pharmacy?: 1 - Never  Diabetic?no  Interpreter Needed?: No  Information entered by :: Charlott Rakes, LPN   Activities of Daily Living    04/25/2022   11:39 AM 04/21/2022    9:22 AM  In your present state of health, do you have any difficulty performing the following activities:  Hearing? 0 0  Vision? 0 0  Difficulty concentrating or making decisions? 0 0  Walking or climbing stairs? 0 0  Dressing or bathing? 0 0  Doing errands, shopping? 0 0  Preparing Food and eating ? N N  Using the Toilet? N N  In the past six months, have you accidently leaked urine? N N  Do you have problems with loss of bowel control? N N  Managing your Medications? N N  Managing your Finances? N N  Housekeeping or managing your Housekeeping? N N    Patient Care Team: Vivi Barrack, MD as PCP - General (Family Medicine) Buford Dresser, MD as PCP - Cardiology (Cardiology) Chevis Pretty (Inactive) as Consulting Physician (Dentistry) Jeffrey [name], MD as Consulting Physician (Vascular Surgery) Warren Danes, PA-C as Physician Assistant (Dermatology) Mal[place], Stephani Police, RN as Oncology Nurse Navigator Eppie Gibson, MD as Consulting Physician (Radiation Oncology) Leta Baptist, MD as Consulting Physician (Otolaryngology) Benay Pike, MD as Consulting Physician (Hematology and Oncology) Edythe Clarity,  [facility] as Pharmacist (Pharmacist) Lavonna Monarch, MD (Inactive) as Consulting Physician (Dermatology)  Indicate any recent Medical Services you may have received from other than [facility] providers in the past year (date may be approximate).     Assessment:   This is a routine wellness examination for [facility].  Hearing/Vision screen Hearing Screening - Comments:: Pt denies any hearing issues  Vision Screening - Comments:: Pt follows  up with Dr Idolina Primer for annual eye exams   Dietary issues and exercise activities discussed: Current Exercise Habits: Home exercise routine, Type of exercise: Other - see comments, Time (Minutes): 15, Frequency (Times/Week): 5, Weekly Exercise (Minutes/Week): 75   Goals Addressed             This Visit's Progress    Patient Stated       Stay healthy and active        Depression Screen    04/25/2022   11:36 AM 04/14/2022    1:04 PM 04/12/2021   11:12 AM 04/06/2020    2:05 PM 03/05/2019    9:34 AM 02/28/2018    1:24 PM 02/27/2017   11:20 AM  PHQ 2/9 Scores  PHQ - 2 Score 0 0 0 0 0 0 0    Fall Risk    04/25/2022   11:39 AM 04/21/2022    9:22 AM 04/14/2022    1:04 PM 04/12/2021   11:17 AM 04/06/2020    2:10 PM  Fall Risk   Falls in the past year? 0 0 0 0 0  Number falls in past yr: 0 0 0 0 0  Injury with Fall? 0 0 0 0 0  Risk for fall due to : Impaired vision  No Fall Risks Impaired vision;Impaired balance/gait Impaired balance/gait;Impaired mobility;Impaired vision  Follow up Falls prevention discussed   Falls prevention discussed Falls prevention discussed    FALL RISK PREVENTION PERTAINING TO THE HOME:  Any stairs in or around the home? Yes  If so, are there any without handrails? No  Home  free of loose throw rugs in walkways, pet beds, electrical cords, etc? Yes  Adequate lighting in your home to reduce risk of falls? Yes   ASSISTIVE DEVICES UTILIZED TO PREVENT FALLS:  Life alert? No  Use of a cane, walker or w/c? No  Grab bars in the  bathroom? Yes  Shower chair or bench in shower? Yes  Elevated toilet seat or a handicapped toilet? No   TIMED UP AND GO:  Was the test performed? No .   Cognitive Function:    02/27/2017   11:25 AM  MMSE - Mini Mental State Exam  Orientation to time 5  Orientation to Place 5  Registration 3  Attention/ Calculation 3  Recall 2  Language- name 2 objects 2  Language- repeat 1  Language- follow 3 step command 3  Language- read & follow direction 1  Write a sentence 1  Copy design 1  Total score 27        04/25/2022   11:40 AM 04/12/2021   11:20 AM 04/06/2020    2:13 PM 03/05/2019    9:45 AM 02/28/2018    1:38 PM  6CIT Screen  What Year? 0 points 0 points 0 points 0 points 0 points  What month? 0 points 0 points 0 points 0 points 0 points  What time? 0 points 0 points  0 points 0 points  Count back from 20 0 points 0 points 0 points 0 points 0 points  Months in reverse 0 points 0 points 0 points 0 points 0 points  Repeat phrase 0 points 0 points 0 points 2 points 10 points  Total Score 0 points 0 points  2 points 10 points    Immunizations Immunization History  Administered Date(s) Administered   Fluad Quad(high Dose 65+) 03/26/2019, 04/09/2020, 04/13/2021, 04/14/2022   Influenza, High Dose Seasonal PF 04/26/2017, 05/05/2018   PFIZER Comirnaty(Gray Top)Covid-19 Tri-Sucrose Vaccine 10/26/2020   PFIZER(Purple Top)SARS-COV-2 Vaccination 07/01/2019, 07/22/2019, 04/24/2020   Pneumococcal Conjugate-13 09/24/2014   Pneumococcal Polysaccharide-23 04/26/2017   Tdap 05/05/2018   Zoster Recombinat (Shingrix) 03/09/2019, 05/14/2019    TDAP status: Up to date  Flu Vaccine status: Up to date  Pneumococcal vaccine status: Up to date  Covid-19 vaccine status: Completed vaccines  Qualifies for Shingles Vaccine? Yes   Zostavax completed Yes   Shingrix Completed?: Yes  Screening Tests Health Maintenance  Topic Date Due   COVID-19 Vaccine (5 - Pfizer risk series) 04/30/2022  (Originally 12/21/2020)   Medicare Annual Wellness (AWV)  04/26/2023   TETANUS/TDAP  05/05/2028   Pneumonia Vaccine 57+ Years old  Completed   INFLUENZA VACCINE  Completed   Zoster Vaccines- Shingrix  Completed   HPV VACCINES  Aged Out    Health Maintenance  There are no preventive care reminders to display for this patient.   Colorectal cancer screening: No longer required.    Additional Screening:   Vision Screening: Recommended annual ophthalmology exams for early detection of glaucoma and other disorders of the eye. Is the patient up to date with their annual eye exam?  Yes  Who is the provider or what is the name of the office in which the patient attends annual eye exams? Dr Idolina Primer If pt is not established with a provider, would they like to be referred to a provider to establish care? No .   Dental Screening: Recommended annual dental exams for proper oral hygiene  Community Resource Referral / Chronic Care Management: CRR required this visit?  No   CCM  required this visit?  No      Plan:     I have personally reviewed and noted the following in the patient's chart:   Medical and social history Use of alcohol, tobacco or illicit drugs  Current medications and supplements including opioid prescriptions. Patient is not currently taking opioid prescriptions. Functional ability and status Nutritional status Physical activity Advanced directives List of other physicians Hospitalizations, surgeries, and ER visits in previous 12 months Vitals Screenings to include cognitive, depression, and falls Referrals and appointments  In addition, I have reviewed and discussed with patient certain preventive protocols, quality metrics, and best practice recommendations. A written personalized care plan for preventive services as well as general preventive health recommendations were provided to patient.     Willette Brace, LPN   14/09/3152   Nurse Notes: none

## 2022-06-03 ENCOUNTER — Telehealth (INDEPENDENT_AMBULATORY_CARE_PROVIDER_SITE_OTHER): Payer: Medicare Other | Admitting: Cardiology

## 2022-06-03 ENCOUNTER — Encounter (HOSPITAL_BASED_OUTPATIENT_CLINIC_OR_DEPARTMENT_OTHER): Payer: Self-pay | Admitting: Cardiology

## 2022-06-03 VITALS — BP 117/72 | HR 62 | Ht 69.0 in | Wt 160.0 lb

## 2022-06-03 DIAGNOSIS — I714 Abdominal aortic aneurysm, without rupture, unspecified: Secondary | ICD-10-CM | POA: Diagnosis not present

## 2022-06-03 DIAGNOSIS — I739 Peripheral vascular disease, unspecified: Secondary | ICD-10-CM | POA: Diagnosis not present

## 2022-06-03 DIAGNOSIS — Z9221 Personal history of antineoplastic chemotherapy: Secondary | ICD-10-CM

## 2022-06-03 DIAGNOSIS — I10 Essential (primary) hypertension: Secondary | ICD-10-CM

## 2022-06-03 DIAGNOSIS — Z9889 Other specified postprocedural states: Secondary | ICD-10-CM

## 2022-06-03 MED ORDER — ASPIRIN 81 MG PO TBEC: 81.0000 mg | DELAYED_RELEASE_TABLET | Freq: Every day | ORAL | 3 refills | Status: DC

## 2022-06-03 NOTE — Patient Instructions (Signed)
Medication Instructions:  Your physician recommends that you continue on your current medications as directed. Please refer to the Current Medication list given to you today.   *If you need a refill on your cardiac medications before your next appointment, please call your pharmacy*  Lab Work: NONE  Testing/Procedures: NONE  Follow-Up: At [facility], you and your health needs are our priority.  As part of our continuing mission to provide you with exceptional heart care, we have created designated Provider Care Teams.  These Care Teams include your primary Cardiologist (physician) and Advanced Practice Providers (APPs -  Physician Assistants and Nurse Practitioners) who all work together to provide you with the care you need, when you need it.  We recommend signing up for the patient portal called "MyChart".  Sign up information is provided on this After Visit Summary.  MyChart is used to connect with patients for Virtual Visits (Telemedicine).  Patients are able to view lab/test results, encounter notes, upcoming appointments, etc.  Non-urgent messages can be sent to your provider as well.   To learn more about what you can do with MyChart, go to [email or web address].    Your next appointment:   12 month(s)  The format for your next appointment:   In Person  Provider:   Buford Dresser, MD    Important Information About Sugar

## 2022-06-03 NOTE — Progress Notes (Signed)
Virtual Visit via Video Note   Because of Jeffrey [name] co-morbid illnesses, he is at least at moderate risk for complications without adequate follow up.  This format is felt to be most appropriate for this patient at this time.  All issues noted in this document were discussed and addressed.  A limited physical exam was performed with this format.  Please refer to the patient's chart for his consent to telehealth for [facility].      The patient was identified using 2 identifiers.  Patient Location: Home Provider Location: Office/Clinic   Date:  06/03/2022   ID:  [name], DOB [birth year], MRN [id number]  PCP:  Vivi Barrack, MD  Cardiologist:  Buford Dresser, MD PhD  Referring MD: Vivi Barrack, MD   Chief complaint: follow up  History of Present Illness:    Jeffrey [name] is a 81 y.o. male with a hx of AAA s/p EVAR by Dr. 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 I initially met him 02/02/18 as a new consult at the request of Dr. [name] for preoperative evaluation prior to vascular surgery.   Today: Restarted aspirin 81 mg in September, no issues. Has been doing well overall. Cancer continues to be in remission. Weight stable. Blood pressures well controlled at home, always higher in the doctor's office. No significant claudication.  Reviewed recent labs, including lipids.  Denies chest pain, shortness of breath at rest or with normal exertion. No PND, orthopnea, LE edema or unexpected weight gain. No syncope or palpitations.   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   Right Buccal Cheek([facility])   Osteopenia     Past Surgical History:  Procedure Laterality Date   ABDOMINAL AORTIC ENDOVASCULAR FENESTRATED STENT GRAFT N/A 03/21/2018   Procedure: ABDOMINAL AORTIC ENDOVASCULAR FENESTRATED STENT GRAFT ; [facility] PERFORMED;  Surgeon: [name], MD;  Location: [facility] OR;  Service: Vascular;  Laterality: N/A;   Basal Skin Cancer  2012   CATARACT EXTRACTION Bilateral    IR GASTROSTOMY TUBE MOD SED  11/06/2020   IR GASTROSTOMY TUBE REMOVAL  04/19/2021   IR IMAGING GUIDED PORT INSERTION  11/06/2020   IR REMOVAL TUN ACCESS W/ PORT W/O FL MOD SED  07/15/2021   MASS BIOPSY Right 09/25/2020   Procedure: OPEN NECK MASS BIOPSY;  Surgeon: Leta Baptist, MD;  Location: [place];  Service: ENT;  Laterality: Right;   MASS EXCISION     x 2; throat and above lip   TONSILLECTOMY     TRANSURETHRAL RESECTION OF PROSTATE      Current Medications: Current Outpatient Medications on File Prior to Visit  Medication Sig   amLODipine (NORVASC) 5 MG tablet TAKE 1 TABLET(5 MG) BY MOUTH DAILY   ARTIFICIAL TEAR SOLUTION OP Place 1 drop into both eyes daily as needed (dry eyes).   Ascorbic Acid (VITAMIN C) 1000 MG tablet Take 1,000 mg by mouth daily.   atorvastatin (LIPITOR) 40 MG tablet TAKE 1 TABLET(40 MG) BY MOUTH DAILY AT 6 PM   Cyanocobalamin 1000  MCG TBCR Take 1,000 mcg by mouth daily.   Emollient (CERAVE) LOTN Apply 1 application topically daily as needed (after shower care).    ferrous sulfate 325 (65 FE) MG tablet Take 325 mg by mouth every other day.   GLUCOSAMINE-CHONDROITIN PO Take 1 tablet by mouth 2 (two) times daily.    losartan (COZAAR) 25 MG tablet Take 1 tablet (25 mg total) by mouth 2 (two) times daily.   No current facility-administered medications on file prior to visit.     Allergies:   Ketotifen fumarate, Mixed ragweed, and Triamcinolone   Social History   Tobacco Use   Smoking status: Former    Packs/day: 1.00     Years: 32.00    Total pack years: 32.00    Types: Cigarettes    Quit date: 08/17/1990    Years since quitting: 31.8   Smokeless tobacco: Never  Vaping Use   Vaping Use: Never used  Substance Use Topics   Alcohol use: Not Currently    Comment: Occassionally, "when I feel like it"   Drug use: No    Family History: The patient's family history includes Alcohol abuse in his father; Colon cancer (age of onset: 66) in his paternal grandmother; Diabetes in his mother; Heart attack in his father; Heart disease in his father; Hyperlipidemia in his sister; Hypertension in his father, mother, and son; Pancreatic cancer in his brother and sister; Stroke in his mother. There is no history of Esophageal cancer, Stomach cancer, or Liver disease.  ROS:   Please see the history of present illness.  Additional pertinent ROS:  EKGs/Labs/Other Studies Reviewed:    The following studies were reviewed today: Echo 03/08/21  1. Left ventricular ejection fraction, by estimation, is 60 to 65%. The  left ventricle has normal function. The left ventricle has no regional  wall motion abnormalities. Left ventricular diastolic parameters were  normal.   2. Right ventricular systolic function is normal. The right ventricular  size is normal.   3. The mitral valve is normal in structure. Trivial mitral valve  regurgitation.   4. The aortic valve is normal in structure. Aortic valve regurgitation is  not visualized.  EKG:  EKG is personally reviewed today.   03/03/21 NSR at 72 bpm, nonspecific t wave pattern  Recent Labs: 08/19/2021: Magnesium 1.8 02/17/2022: ALT 12; BUN 17; Creatinine, Ser 1.06; Hemoglobin 10.1; Platelets 141; Potassium 4.0; Sodium 129 04/14/2022: TSH 2.93  Recent Lipid Panel    Component Value Date/Time   CHOL 123 04/14/2022 1337   CHOL 117 02/03/2020 1134   TRIG 38.0 04/14/2022 1337   HDL 70.20 04/14/2022 1337   HDL 60 02/03/2020 1134   CHOLHDL 2 04/14/2022 1337   VLDL 7.6  04/14/2022 1337   LDLCALC 45 04/14/2022 1337   LDLCALC 43 04/09/2020 1417    Physical Exam:    VS:  BP 117/72   Pulse 62   Ht _0  (1.753 m)   Wt 160 lb (72.6 kg)   BMI 23.63 kg/m     Wt Readings from Last 3 Encounters:  06/03/22 160 lb (72.6 kg)  04/25/22 162 lb (73.5 kg)  04/14/22 162 lb (73.5 kg)    VITAL SIGNS:  reviewed GEN:  no acute distress EYES:  sclerae anicteric, EOMI - Extraocular Movements Intact RESPIRATORY:  normal respiratory effort, symmetric expansion CARDIOVASCULAR:  no visible JVD SKIN:  no rash, lesions or ulcers. MUSCULOSKELETAL:  no obvious deformities. NEURO:  alert and oriented x 3, no obvious focal  deficit PSYCH:  normal affect   ASSESSMENT:    1. Abdominal aortic aneurysm (AAA) without rupture, unspecified part ([place])   2. History of stent insertion of renal artery   3. PVD (peripheral vascular disease) ([place])   4. Essential hypertension   5. Hx antineoplastic chemotherapy     PLAN:    Squamous cell carcinoma of the oropharynx, s/p cisplatin and radiation -echo reassuring 02/2021 -no evidence of active disease on recent scans, PEG tube removed, port still in place  AAA s/p FEVAR and bilateral renal stents 2019: followed by Dr. Trula Slade -continue statin -restarted aspirin with improvement in blood counts 02/2022, no issues -last seen by Dr. Trula Slade 07/05/2021. 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 Limit salt. -recommend moderate walking, 3-5 times/week for 30-50 minutes each  session. Aim for at least 150 minutes.week. Goal should be pace of 3 miles/hours, or walking 1.5 miles in 30 minutes -recommend avoidance of tobacco products. Avoid excess alcohol.  Plan for follow up:  1 year or sooner as needed  Today, I have spent 11 minutes with the patient with telehealth technology discussing the above problems.  Additional time spent in chart review, documentation, and communication.   Medication Adjustments/Labs and Tests Ordered: Current medicines are reviewed at length with the patient today.  Concerns regarding medicines are outlined above.  No orders of the defined types were placed in this encounter.  Meds ordered this encounter  Medications   aspirin EC 81 MG tablet    Sig: Take 1 tablet (81 mg total) by mouth daily. Swallow whole.    Dispense:  90 tablet    Refill:  3   Patient Instructions  Medication Instructions:  Your physician recommends that you continue on your current medications as directed. Please refer to the Current Medication list given to you today.   *If you need a refill on your cardiac medications before your next appointment, please call your pharmacy*  Lab Work: NONE  Testing/Procedures: NONE  Follow-Up: At [facility], you and your health needs are our priority.  As part of our continuing mission to provide you with exceptional heart care, we have created designated Provider Care Teams.  These Care Teams include your primary Cardiologist (physician) and Advanced Practice Providers (APPs -  Physician Assistants and Nurse Practitioners) who all work together to provide you with the care you need, when you need it.  We recommend signing up for the patient portal called "MyChart".  Sign up information is provided on this After Visit Summary.  MyChart is used to connect with patients for Virtual Visits (Telemedicine).  Patients are able to view lab/test results, encounter notes, upcoming appointments, etc.  Non-urgent messages  can be sent to your provider as well.   To learn more about what you can do with MyChart, go to [email or web address].    Your next appointment:   12 month(s)  The format for your next appointment:   In Person  Provider:   Buford Dresser, MD    Important Information About Sugar        Signed, Buford Dresser, MD PhD 06/03/2022  [facility]

## 2022-06-20 ENCOUNTER — Encounter: Payer: Self-pay | Admitting: Family Medicine

## 2022-06-22 ENCOUNTER — Telehealth: Payer: Self-pay | Admitting: *Deleted

## 2022-06-22 ENCOUNTER — Encounter: Payer: Self-pay | Admitting: Hematology and Oncology

## 2022-06-22 ENCOUNTER — Encounter: Payer: Self-pay | Admitting: *Deleted

## 2022-06-22 NOTE — Patient Instructions (Signed)
Visit Information  Thank you for taking time to visit with me today. Please don't hesitate to contact me if I can be of assistance to you.   Following are the goals we discussed today:   Goals Addressed             This Visit's Progress    COMPLETED: Care coordination activity       Care Coordination Interventions: Reviewed medications with patient and discussed adherence with no needed refills Reviewed scheduled/upcoming provider appointments including sufficient transportation source Screening for signs and symptoms of depression related to chronic disease state  Assessed social determinant of health barriers Educated on care management services related to nurse care manager, Education officer, museum and pharmacy.          Please call the care guide team at [phone] if you need to cancel or reschedule your appointment.   If you are experiencing a Mental Health or [place] or need someone to talk to, please call the Suicide and Crisis Lifeline: 988  Patient verbalizes understanding of instructions and care plan provided today and agrees to view in [place]. Active MyChart status and patient understanding of how to access instructions and care plan via MyChart confirmed with patient.     No further follow up required: No follow up needs  Raina Mina, RN Care Management Coordinator [place] Office [phone]

## 2022-06-22 NOTE — Patient Outreach (Signed)
  Care Coordination   Initial Visit Note   06/22/2022 Name: Jeffrey [name] MRN: [id number] DOB: April 06, 1941  Jeffrey [name] is a 82 y.o. year old male who sees Vivi Barrack, MD for primary care. I spoke with  [name] by phone today.  What matters to the patients health and wellness today?  No needs    Goals Addressed             This Visit's Progress    COMPLETED: Care coordination activity       Care Coordination Interventions: Reviewed medications with patient and discussed adherence with no needed refills Reviewed scheduled/upcoming provider appointments including sufficient transportation source Screening for signs and symptoms of depression related to chronic disease state  Assessed social determinant of health barriers Educated on care management services related to nurse care manager, Education officer, museum and pharmacy.          SDOH assessments and interventions completed:  Yes  SDOH Interventions Today    Flowsheet Row Most Recent Value  SDOH Interventions   Food Insecurity Interventions Intervention Not Indicated  Housing Interventions Intervention Not Indicated  Transportation Interventions Intervention Not Indicated  Utilities Interventions Intervention Not Indicated        Care Coordination Interventions:  Yes, provided   Follow up plan: No further intervention required.   Encounter Outcome:  Pt. Visit Completed   Raina Mina, RN Care Management Coordinator [place] Office [phone]

## 2022-07-15 ENCOUNTER — Telehealth: Payer: Self-pay | Admitting: Pharmacist

## 2022-07-15 NOTE — Progress Notes (Unsigned)
Care Management & Coordination Services Pharmacy Team  Reason for Encounter: General adherence update   Contacted patient for general health update and medication adherence call.  Spoke with patient on 07/15/2022    What concerns do you have about your medications? None  The patient denies side effects with their medications.   How often do you forget or accidentally miss a dose? Never  Do you use a pillbox? No  Are you having any problems getting your medications from your pharmacy? No  Has the cost of your medications been a concern? No If yes, what medication and is patient assistance available or has it been applied for?  Since last visit with PharmD, no interventions have been made.   The patient has not had an ED visit since last contact.   The patient denies problems with their health.   Patient denies concerns or questions for Leata Mouse, PharmD at this time.   Counseled patient on: Access to carecoordination team for any cost, medication or pharmacy concerns.   Chart Updates:  Recent office visits:  04/14/2022 OV (PCP) Vivi Barrack, MD; no medication changes indicated.  Recent consult visits:  02/17/2022 OV (Oncology) Benay Pike, MD; no medication changes indicated.  Hospital visits:  None in previous 6 months  Medications: Outpatient Encounter Medications as of 07/15/2022  Medication Sig   amLODipine (NORVASC) 5 MG tablet TAKE 1 TABLET(5 MG) BY MOUTH DAILY   ARTIFICIAL TEAR SOLUTION OP Place 1 drop into both eyes daily as needed (dry eyes).   Ascorbic Acid (VITAMIN C) 1000 MG tablet Take 1,000 mg by mouth daily.   aspirin EC 81 MG tablet Take 1 tablet (81 mg total) by mouth daily. Swallow whole.   atorvastatin (LIPITOR) 40 MG tablet TAKE 1 TABLET(40 MG) BY MOUTH DAILY AT 6 PM   Cyanocobalamin 1000 MCG TBCR Take 1,000 mcg by mouth daily.   Emollient (CERAVE) LOTN Apply 1 application topically daily as needed (after shower care).    ferrous sulfate  325 (65 FE) MG tablet Take 325 mg by mouth every other day.   GLUCOSAMINE-CHONDROITIN PO Take 1 tablet by mouth 2 (two) times daily.    losartan (COZAAR) 25 MG tablet Take 1 tablet (25 mg total) by mouth 2 (two) times daily.   No facility-administered encounter medications on file as of 07/15/2022.    Recent vitals BP Readings from Last 3 Encounters:  06/03/22 117/72  04/14/22 138/77  02/17/22 138/73   Pulse Readings from Last 3 Encounters:  06/03/22 62  04/14/22 (!) 57  02/17/22 60   Wt Readings from Last 3 Encounters:  06/03/22 160 lb (72.6 kg)  04/25/22 162 lb (73.5 kg)  04/14/22 162 lb (73.5 kg)   BMI Readings from Last 3 Encounters:  06/03/22 23.63 kg/m  04/25/22 23.92 kg/m  04/14/22 23.92 kg/m    Recent lab results    Component Value Date/Time   NA 129 (L) 02/17/2022 1209   NA 135 02/03/2020 1134   K 4.0 02/17/2022 1209   CL 97 (L) 02/17/2022 1209   CO2 27 02/17/2022 1209   GLUCOSE 95 02/17/2022 1209   BUN 17 02/17/2022 1209   BUN 10 02/03/2020 1134   CREATININE 1.06 02/17/2022 1209   CREATININE 1.12 07/07/2021 1118   CREATININE 1.00 05/11/2020 1127   CALCIUM 9.7 02/17/2022 1209    Lab Results  Component Value Date   CREATININE 1.06 02/17/2022   GFR 71.21 05/11/2021   GFRNONAA >60 02/17/2022   GFRAA 75 02/03/2020  Lab Results  Component Value Date/Time   HGBA1C 4.9 04/14/2022 01:37 PM   HGBA1C 5.2 04/13/2021 02:19 PM    Lab Results  Component Value Date   CHOL 123 04/14/2022   HDL 70.20 04/14/2022   LDLCALC 45 04/14/2022   TRIG 38.0 04/14/2022   CHOLHDL 2 04/14/2022    Care Gaps: Annual wellness visit in last year? Yes  Star Rating Drugs:  Atorvastatin 40 mg last filled 06/12/2022 90 DS Losartan 25 mg last filled 06/03/2022 90 DS  Future Appointments  Date Time Provider [place]  02/21/2023 12:30 PM [facility] None  02/21/2023  1:00 PM Benay Pike, MD [facility] None  04/17/2023  1:00 PM Vivi Barrack,  MD [facility]  05/01/2023 11:30 AM [facility] HEALTH COACH [facility]   April D Calhoun, [place] Pharmacist Assistant [phone]

## 2022-07-22 DIAGNOSIS — R1312 Dysphagia, oropharyngeal phase: Secondary | ICD-10-CM | POA: Diagnosis not present

## 2022-07-25 DIAGNOSIS — K080 Exfoliation of teeth due to systemic causes: Secondary | ICD-10-CM | POA: Diagnosis not present

## 2022-08-20 ENCOUNTER — Encounter: Payer: Self-pay | Admitting: Hematology and Oncology

## 2022-08-22 ENCOUNTER — Other Ambulatory Visit: Payer: Self-pay | Admitting: *Deleted

## 2022-08-22 DIAGNOSIS — I739 Peripheral vascular disease, unspecified: Secondary | ICD-10-CM

## 2022-08-22 DIAGNOSIS — Z9889 Other specified postprocedural states: Secondary | ICD-10-CM

## 2022-08-24 ENCOUNTER — Ambulatory Visit: Payer: Medicare Other | Admitting: Dermatology

## 2022-08-25 ENCOUNTER — Ambulatory Visit (HOSPITAL_COMMUNITY)
Admission: RE | Admit: 2022-08-25 | Discharge: 2022-08-25 | Disposition: A | Discharge status: Home / Self Care | Payer: Medicare Other | Source: Ambulatory Visit | Attending: Vascular Surgery | Admitting: Vascular Surgery

## 2022-08-25 ENCOUNTER — Ambulatory Visit (INDEPENDENT_AMBULATORY_CARE_PROVIDER_SITE_OTHER)
Admission: RE | Admit: 2022-08-25 | Discharge: 2022-08-25 | Disposition: A | Discharge status: Home / Self Care | Payer: Medicare Other | Source: Ambulatory Visit | Attending: Vascular Surgery | Admitting: Vascular Surgery

## 2022-08-25 ENCOUNTER — Other Ambulatory Visit: Payer: Self-pay | Admitting: Cardiology

## 2022-08-25 DIAGNOSIS — Z9889 Other specified postprocedural states: Secondary | ICD-10-CM | POA: Diagnosis not present

## 2022-08-25 DIAGNOSIS — I739 Peripheral vascular disease, unspecified: Secondary | ICD-10-CM

## 2022-08-25 DIAGNOSIS — I10 Essential (primary) hypertension: Secondary | ICD-10-CM

## 2022-08-25 LAB — VAS US ABI WITH/WO TBI
Left ABI: 0.84
Right ABI: 1.19

## 2022-08-25 NOTE — Telephone Encounter (Signed)
Rx request sent to pharmacy.  

## 2022-09-05 ENCOUNTER — Ambulatory Visit (INDEPENDENT_AMBULATORY_CARE_PROVIDER_SITE_OTHER): Payer: Medicare Other | Admitting: Physician Assistant

## 2022-09-05 VITALS — BP 131/72 | HR 65 | Temp 97.8°F | Resp 16 | Ht 69.0 in | Wt 161.0 lb

## 2022-09-05 DIAGNOSIS — Z923 Personal history of irradiation: Secondary | ICD-10-CM

## 2022-09-05 DIAGNOSIS — I714 Abdominal aortic aneurysm, without rupture, unspecified: Secondary | ICD-10-CM | POA: Diagnosis not present

## 2022-09-05 DIAGNOSIS — Z9889 Other specified postprocedural states: Secondary | ICD-10-CM

## 2022-09-05 NOTE — Progress Notes (Signed)
Office Note     CC:  follow up Requesting Provider:  Vivi Barrack, MD  HPI: Jeffrey [name] is a 82 y.o. (May 14, 1941) male who presents for surveillance of FEVAR scalloped to the superior mesenteric artery and bilateral renal artery stents on March 21, 2018 by Dr. Trula Slade.  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  He experienced a silent occlusion of his left limb however only has mild claudication symptoms of the left lower extremity.  Claudication symptoms do not impact his day-to-day life.  He denies rest pain and tissue loss.  He also denies any new abdominal or back pain.  He denies any changes in his renal function based on lab work with his PCP and also denies any diagnosis of CKD.  In the past 1 to 2 years he has completed radiation for throat cancer however has not had his carotid arteries imaged.  He denies any diagnosis of TIA or CVA.  He also denies any strokelike symptoms including slurring speech, changes in vision, or one-sided weakness.  He takes an aspirin and a statin daily.  He denies tobacco use.  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   Right Buccal Cheek([facility])   Osteopenia     Past Surgical History:  Procedure Laterality Date   ABDOMINAL AORTIC ENDOVASCULAR FENESTRATED STENT GRAFT N/A 03/21/2018   Procedure: ABDOMINAL AORTIC ENDOVASCULAR FENESTRATED STENT GRAFT ; [place] CT PERFORMED;  Surgeon: [name], MD;  Location: [facility];  Service: Vascular;  Laterality: N/A;   Basal Skin Cancer  2012   CATARACT EXTRACTION Bilateral    IR GASTROSTOMY TUBE MOD SED  11/06/2020   IR GASTROSTOMY TUBE REMOVAL  04/19/2021   IR IMAGING GUIDED PORT INSERTION   11/06/2020   IR REMOVAL TUN ACCESS W/ PORT W/O FL MOD SED  07/15/2021   MASS BIOPSY Right 09/25/2020   Procedure: OPEN NECK MASS BIOPSY;  Surgeon: Leta Baptist, MD;  Location: [facility];  Service: ENT;  Laterality: Right;   MASS EXCISION     x 2; throat and above lip   TONSILLECTOMY     TRANSURETHRAL RESECTION OF PROSTATE      Social History   Socioeconomic History   Marital status: Divorced    Spouse name: Not on file   Number of children: 1   Years of education: Not on file   Highest education level: Not on file  Occupational History   Occupation: Retired    Fish farm manager: [employer]  Tobacco Use   Smoking status: Former    Packs/day: 1.00    Years: 32.00    Additional pack years: 0.00    Total pack years: 32.00    Types: Cigarettes    Quit date: 08/17/1990    Years since quitting: 32.0   Smokeless tobacco: Never  Vaping Use   Vaping Use: Never used  Substance and Sexual Activity   Alcohol use: Not Currently    Comment: Occassionally, "when I feel like it"   Drug use: No   Sexual activity: Yes    Partners: Female  Other Topics Concern   Not on file  Social History Narrative   Not on file   Social Determinants of Health   Financial Resource Strain: Low Risk  (04/25/2022)   Overall Financial Resource Strain (CARDIA)    Difficulty of Paying Living Expenses: Not hard at all  Food Insecurity: No Food Insecurity (06/22/2022)   Hunger Vital Sign    Worried About Running Out of Food in the Last Year: Never true    Ran Out of Food in the Last Year: Never true  Transportation Needs: No Transportation Needs (06/22/2022)   PRAPARE - Hydrologist (Medical): No    Lack of Transportation (Non-Medical): No  Physical Activity: Insufficiently Active (04/25/2022)   Exercise Vital Sign    Days of Exercise per Week: 5 days    Minutes of Exercise per Session: 20 min  Stress: No Stress Concern Present (04/25/2022)   [place]    Feeling of Stress : Not at all  Social Connections: Moderately Isolated (04/25/2022)   Social Connection and Isolation Panel [NHANES]    Frequency of Communication with Friends and Family: Three times a week    Frequency of Social Gatherings with Friends and Family: Once a week    Attends Religious Services: Never    Marine scientist or Organizations: Yes    Attends Archivist Meetings: 1 to 4 times per year    Marital Status: Divorced  Intimate Partner Violence: Not At Risk (04/25/2022)   Humiliation, Afraid, Rape, and Kick questionnaire    Fear of Current or Ex-Partner: No    Emotionally Abused: No    Physically Abused: No    Sexually Abused: No    Family History  Problem Relation Age of Onset   Diabetes Mother    Hypertension Mother    Stroke Mother    Alcohol abuse Father    Heart attack Father    Heart disease Father    Hypertension Father    Hyperlipidemia Sister    Pancreatic cancer Sister    Pancreatic cancer Brother    Colon cancer Paternal Grandmother 93   Hypertension Son    Esophageal cancer Neg Hx    Stomach cancer Neg Hx    Liver disease Neg Hx     Current Outpatient Medications  Medication Sig Dispense Refill   amLODipine (NORVASC) 5 MG tablet TAKE 1 TABLET(5 MG) BY MOUTH DAILY 90 tablet 1   ARTIFICIAL TEAR SOLUTION OP Place 1 drop into both eyes daily as needed (dry eyes).     Ascorbic Acid (VITAMIN C) 1000 MG tablet Take 1,000 mg by mouth daily.     aspirin EC 81 MG tablet Take 1 tablet (81 mg total) by mouth daily. Swallow whole. 90 tablet 3   atorvastatin (LIPITOR) 40 MG tablet TAKE 1 TABLET(40 MG) BY MOUTH DAILY AT 6 PM 90 tablet 2   Cyanocobalamin 1000 MCG TBCR Take 1,000 mcg by mouth daily.     Emollient (CERAVE) LOTN Apply 1 application topically daily as needed (after shower care).      ferrous sulfate 325 (65 FE) MG tablet Take 325 mg by mouth every other day.      GLUCOSAMINE-CHONDROITIN PO Take 1 tablet by mouth 2 (two) times daily.      losartan (COZAAR) 25 MG tablet Take 1 tablet (25 mg total) by mouth 2 (two) times daily. 180 tablet 3   No current facility-administered medications for this visit.    Allergies  Allergen Reactions   Ketotifen Fumarate Swelling and Other (See Comments)    Eye swelling   Mixed Ragweed     Hay fever   Triamcinolone Hives and Rash     REVIEW OF SYSTEMS:   [X]  denotes positive finding, [ ]  denotes negative finding Cardiac  Comments:  Chest pain or chest pressure:    Shortness of breath upon exertion:    Short of breath when lying flat:    Irregular heart rhythm:        Vascular    Pain in calf, thigh, or hip brought on by ambulation:    Pain in feet at night that wakes you up from your sleep:     Blood clot in your veins:    Leg swelling:         Pulmonary    Oxygen at home:    Productive cough:     Wheezing:         Neurologic    Sudden weakness in arms or legs:     Sudden numbness in arms or legs:     Sudden onset of difficulty speaking or slurred speech:    Temporary loss of vision in one eye:     Problems with dizziness:         Gastrointestinal    Blood in stool:     Vomited blood:         Genitourinary    Burning when urinating:     Blood in urine:        Psychiatric    Major depression:         Hematologic    Bleeding problems:    Problems with blood clotting too easily:        Skin    Rashes or ulcers:        Constitutional    Fever or chills:      PHYSICAL EXAMINATION:  Vitals:   09/05/22 1448  BP: 131/72  Pulse: 65  Resp: 16  Temp: 97.8 F (36.6 C)  TempSrc: Temporal  SpO2: 100%  Weight: 161 lb (73 kg)  Height: 5\' 9"  (1.753 m)    General:  WDWN in NAD; vital signs documented above Gait: Not observed HENT: WNL, normocephalic Pulmonary: normal non-labored breathing , without Rales, rhonchi,  wheezing Cardiac: regular HR Abdomen: soft, NT, no  masses Skin: without rashes Vascular Exam/Pulses: Absent left pedal pulses however foot is warm with motor and sensation intact; palpable right DP pulse Extremities: without ischemic changes, without Gangrene , without cellulitis; without open wounds;  Musculoskeletal: no muscle wasting or atrophy  Neurologic: A&O X 3;  No focal weakness or paresthesias are detected Psychiatric:  The pt has Normal affect.   Non-Invasive Vascular Imaging:   5.35 cm AAA sac without any evidence of endoleak; previous measurements have been in the 3 to 4 cm range Widely patent bilateral renal artery stents  ABI/TBIToday's ABIToday's TBIPrevious ABIPrevious TBI  +-------+-----------+-----------+------------+------------+  Right 1.19       0.86       1.18        0.99          +-------+-----------+-----------+------------+------------+  Left  0.84       0.52       0.73        0.38          +-------+-----------+-----------+------------+------------+     ASSESSMENT/PLAN:: 82 y.o. male here for follow up for surveillance of fenestrated endovascular repair of abdominal aortic aneurysm involving  bilateral renal artery stents.  He has a known left limb occlusion  -Despite left limb occlusion of EVAR, he only has mild claudication symptoms of the left lower extremity.  He is without rest pain or tissue loss.  No indication for revascularization currently. -EVAR duplex demonstrates a 5.3 cm residual AAA sac without any evidence of endoleak.  Previous surveillance duplex demonstrates the residual sac in the 3 to 4 cm range.  After discussion with Dr. Trula Slade we will check a CTA in 6 months time -Patient has also had radiation to the neck in the past 1 to 2 years due to throat cancer.  He has not had subsequent imaging to be able to evaluate carotid arteries.  He has not had any stroke symptoms or neuro logical events.  We will check a carotid duplex in 6 months as well. -Continue aspirin and statin  daily -Patient knows to call/return office sooner with any questions or concerns.  He knows to notify us if he develops any claudication, rest pain, or tissue loss especially of his left lower extremity.   Dagoberto Ligas, PA-C [facility] [phone]  Clinic MD:   Trula Slade

## 2022-09-06 ENCOUNTER — Other Ambulatory Visit: Payer: Self-pay

## 2022-09-06 DIAGNOSIS — Z923 Personal history of irradiation: Secondary | ICD-10-CM

## 2022-09-12 ENCOUNTER — Other Ambulatory Visit: Payer: Self-pay | Admitting: Cardiology

## 2022-09-12 NOTE — Telephone Encounter (Signed)
Rx request sent to pharmacy.  

## 2022-09-16 IMAGING — DX DG CERVICAL SPINE 2 OR 3 VIEWS
4 series · 4 of 4 positions shown · non-contrast
Comparison: None.

CLINICAL DATA: Chronic neck pain

EXAM:
CERVICAL SPINE - 2-3 VIEW

[c-spine lat]
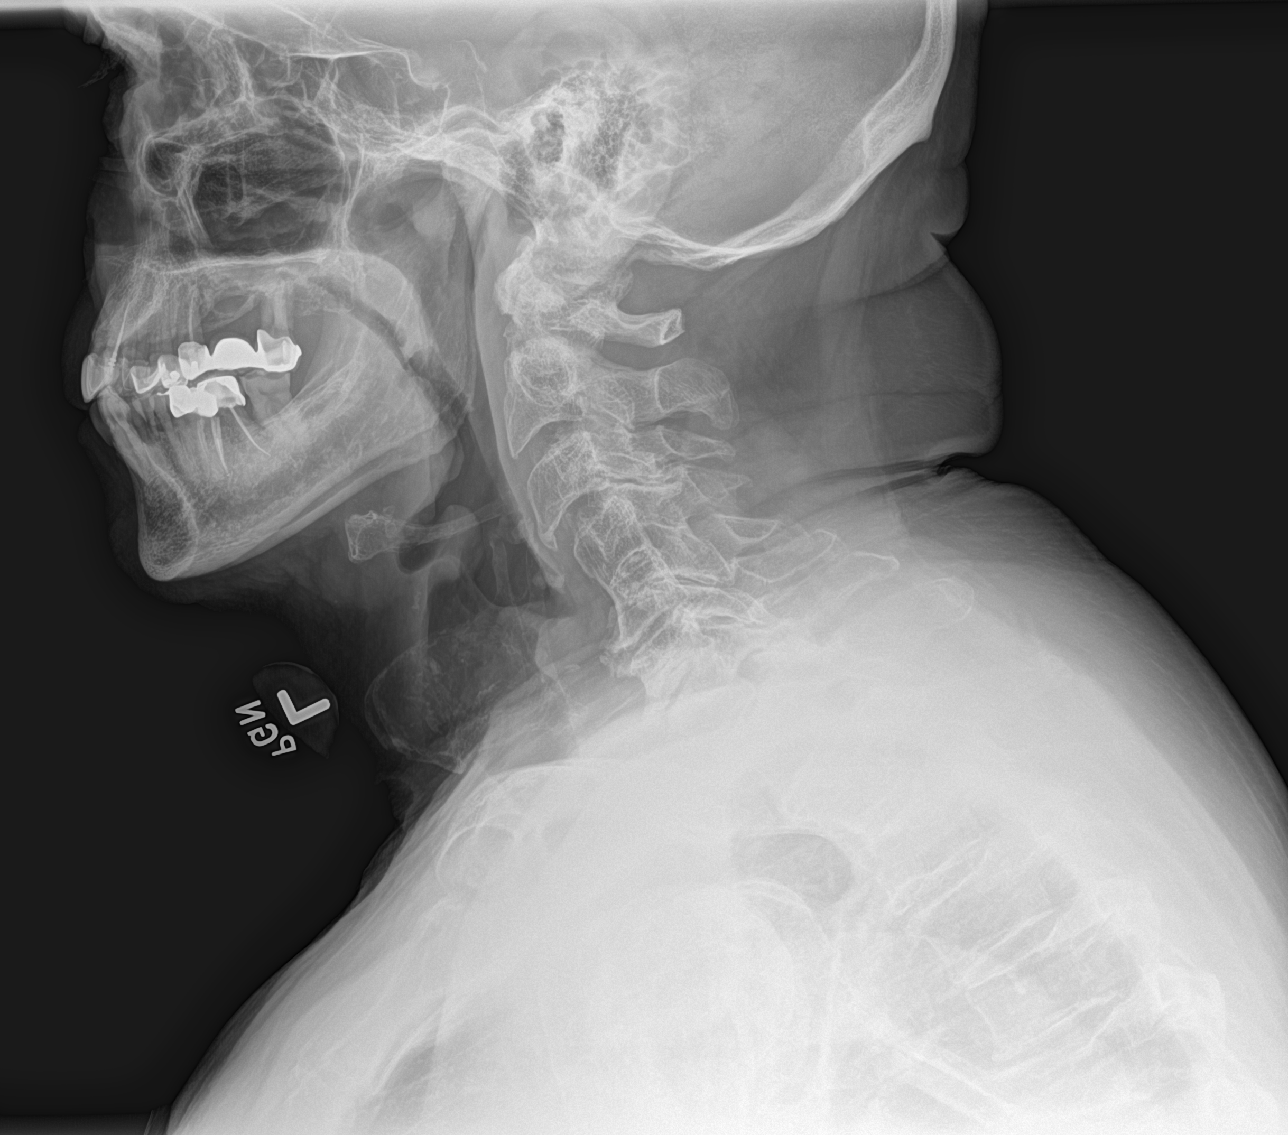

[c-spine ap]
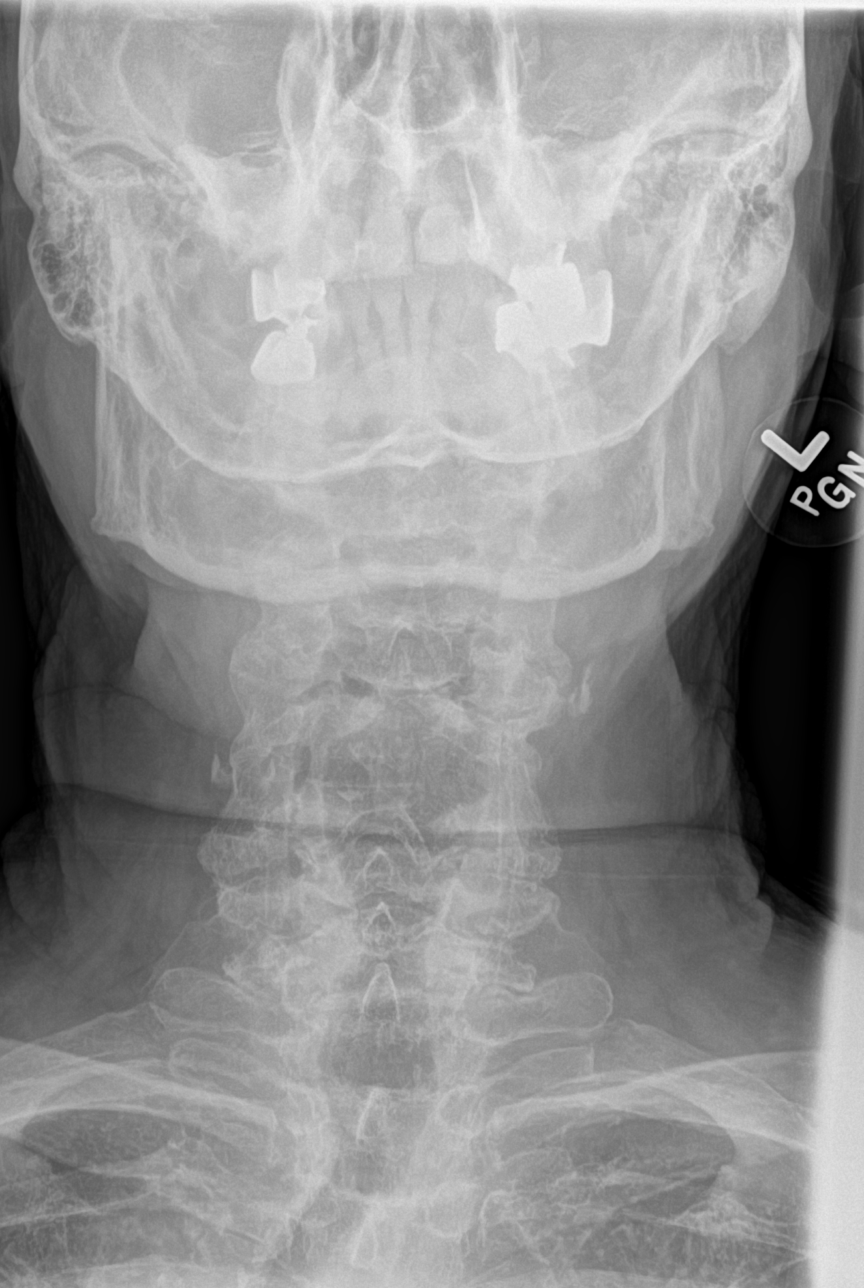

[c-spine open mouth (1 of 2)]
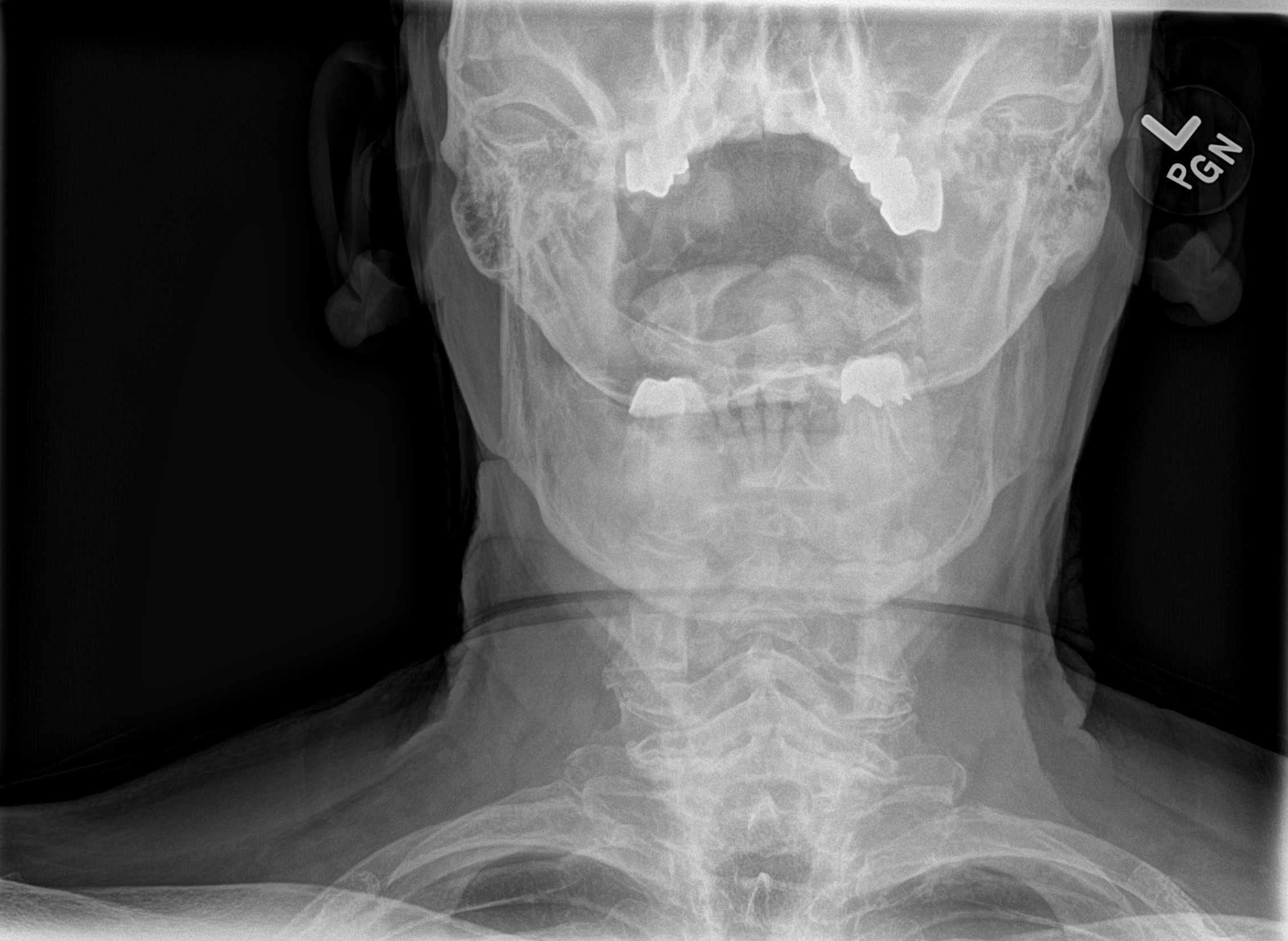

[c-spine open mouth (2 of 2)]
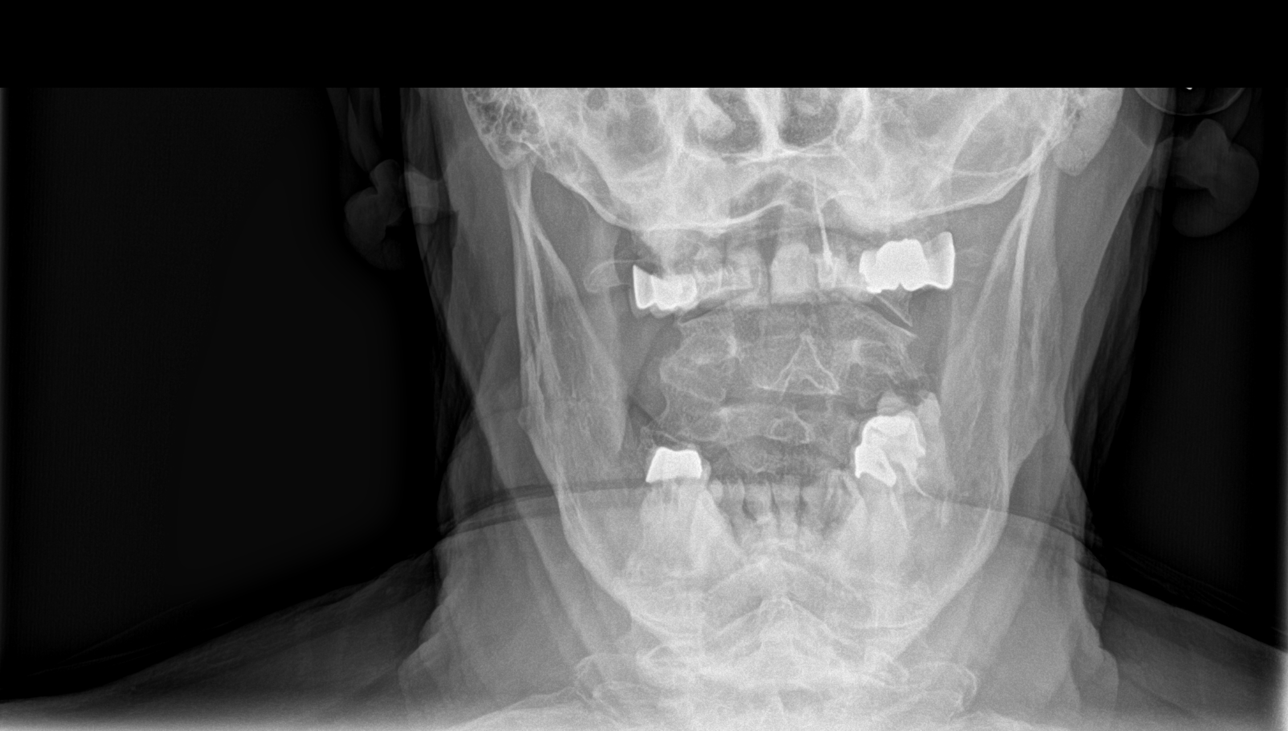

[4 of 4 positions shown; findings below may reference images not displayed]

FINDINGS: There is no prevertebral soft tissue swelling. Severe multilevel
degenerative changes are noted throughout the cervical spine
greatest at the C4-C5, C5-C6, and C6-C7 levels. There is no acute
compression fracture. No significant malalignment. Carotid artery
calcifications are noted. The odontoid view is nearly nondiagnostic
secondary to overlapping osseous structures and teeth. The lateral
masses of C1 appear to be appropriately positioned.
IMPRESSION: 1. No acute osseous abnormality. Severe multilevel degenerative
changes are noted of the cervical spine.
2. Carotid artery calcifications are noted.

## 2022-10-19 ENCOUNTER — Telehealth: Payer: Self-pay | Admitting: Pharmacist

## 2022-10-19 NOTE — Progress Notes (Signed)
Care Management & Coordination Services Pharmacy Team  Reason for Encounter: General adherence update   Contacted patient for general health update and medication adherence call.  Spoke with patient on 10/19/2022    What concerns do you have about your medications? None  The patient denies side effects with their medications.   How often do you forget or accidentally miss a dose? Never  Do you use a pillbox? No  Are you having any problems getting your medications from your pharmacy? No  Has the cost of your medications been a concern? No If yes, what medication and is patient assistance available or has it been applied for?  Since last visit with PharmD, no interventions have been made.   The patient has not had an ED visit since last contact.   The patient denies problems with their health.   Patient denies concerns or questions for Erskine Emery, PharmD at this time.   Counseled patient on: Access to carecoordination team for any cost, medication or pharmacy concerns.   Chart Updates:  Recent office visits:  None  Recent consult visits:  09/05/2022 OV (Vasc Surg) Emilie Rutter, PA-C; no medication changes indicated.  Hospital visits:  None in previous 6 months  Medications: Outpatient Encounter Medications as of 10/19/2022  Medication Sig   amLODipine (NORVASC) 5 MG tablet TAKE 1 TABLET(5 MG) BY MOUTH DAILY   ARTIFICIAL TEAR SOLUTION OP Place 1 drop into both eyes daily as needed (dry eyes).   Ascorbic Acid (VITAMIN C) 1000 MG tablet Take 1,000 mg by mouth daily.   aspirin EC 81 MG tablet Take 1 tablet (81 mg total) by mouth daily. Swallow whole.   atorvastatin (LIPITOR) 40 MG tablet TAKE 1 TABLET(40 MG) BY MOUTH DAILY AT 6 PM   Cyanocobalamin 1000 MCG TBCR Take 1,000 mcg by mouth daily.   Emollient (CERAVE) LOTN Apply 1 application topically daily as needed (after shower care).    ferrous sulfate 325 (65 FE) MG tablet Take 325 mg by mouth every other day.    GLUCOSAMINE-CHONDROITIN PO Take 1 tablet by mouth 2 (two) times daily.    losartan (COZAAR) 25 MG tablet Take 1 tablet (25 mg total) by mouth 2 (two) times daily.   No facility-administered encounter medications on file as of 10/19/2022.    Recent vitals BP Readings from Last 3 Encounters:  09/05/22 131/72  06/03/22 117/72  04/14/22 138/77   Pulse Readings from Last 3 Encounters:  09/05/22 65  06/03/22 62  04/14/22 (!) 57   Wt Readings from Last 3 Encounters:  09/05/22 161 lb (73 kg)  06/03/22 160 lb (72.6 kg)  04/25/22 162 lb (73.5 kg)   BMI Readings from Last 3 Encounters:  09/05/22 23.78 kg/m  06/03/22 23.63 kg/m  04/25/22 23.92 kg/m    Recent lab results    Component Value Date/Time   NA 129 (L) 02/17/2022 1209   NA 135 02/03/2020 1134   K 4.0 02/17/2022 1209   CL 97 (L) 02/17/2022 1209   CO2 27 02/17/2022 1209   GLUCOSE 95 02/17/2022 1209   BUN 17 02/17/2022 1209   BUN 10 02/03/2020 1134   CREATININE 1.06 02/17/2022 1209   CREATININE 1.12 07/07/2021 1118   CREATININE 1.00 05/11/2020 1127   CALCIUM 9.7 02/17/2022 1209    Lab Results  Component Value Date   CREATININE 1.06 02/17/2022   GFR 71.21 05/11/2021   GFRNONAA >60 02/17/2022   GFRAA 75 02/03/2020   Lab Results  Component Value Date/Time  HGBA1C 4.9 04/14/2022 01:37 PM   HGBA1C 5.2 04/13/2021 02:19 PM    Lab Results  Component Value Date   CHOL 123 04/14/2022   HDL 70.20 04/14/2022   LDLCALC 45 04/14/2022   TRIG 38.0 04/14/2022   CHOLHDL 2 04/14/2022    Care Gaps: Annual wellness visit in last year? Yes  Star Rating Drugs:  Atorvastatin 40 mg last filled 09/10/2022 90 DS Losartan 25 mg last filled 08/31/2022 90 DS   Future Appointments  Date Time Provider Department Center  02/21/2023 12:30 PM [facility] None  02/21/2023  1:00 PM Rachel Moulds, MD [facility] None  02/27/2023  1:00 PM [facility]ASC 3 - EM [facility]  02/27/2023  1:40 PM Nada Libman, MD [facility]  04/17/2023  1:00 PM Ardith Dark, MD [facility]  05/01/2023 11:30 AM [facility] ANNUAL WELLNESS VISIT 1 [facility]   April D Calhoun, [facility] Clinical Pharmacist Assistant [phone]

## 2022-10-25 DIAGNOSIS — R590 Localized enlarged lymph nodes: Secondary | ICD-10-CM | POA: Diagnosis not present

## 2022-10-25 DIAGNOSIS — R1312 Dysphagia, oropharyngeal phase: Secondary | ICD-10-CM | POA: Diagnosis not present

## 2022-10-31 ENCOUNTER — Other Ambulatory Visit: Payer: Self-pay | Admitting: Otolaryngology

## 2022-10-31 DIAGNOSIS — R221 Localized swelling, mass and lump, neck: Secondary | ICD-10-CM

## 2022-11-28 DIAGNOSIS — K080 Exfoliation of teeth due to systemic causes: Secondary | ICD-10-CM | POA: Diagnosis not present

## 2022-11-29 ENCOUNTER — Other Ambulatory Visit: Payer: Self-pay | Admitting: Family Medicine

## 2022-11-29 DIAGNOSIS — I10 Essential (primary) hypertension: Secondary | ICD-10-CM

## 2022-12-01 ENCOUNTER — Ambulatory Visit
Admission: RE | Admit: 2022-12-01 | Discharge: 2022-12-01 | Disposition: A | Discharge status: Home / Self Care | Payer: Medicare Other | Source: Ambulatory Visit | Attending: Otolaryngology | Admitting: Otolaryngology

## 2022-12-01 DIAGNOSIS — R221 Localized swelling, mass and lump, neck: Secondary | ICD-10-CM

## 2022-12-01 DIAGNOSIS — Z8501 Personal history of malignant neoplasm of esophagus: Secondary | ICD-10-CM | POA: Diagnosis not present

## 2022-12-01 MED ORDER — IOPAMIDOL (ISOVUE-300) INJECTION 61%
75.0000 mL | Freq: Once | INTRAVENOUS | Status: AC | PRN
  Administered 2022-12-01: 75 mL via INTRAVENOUS

## 2022-12-29 DIAGNOSIS — H5203 Hypermetropia, bilateral: Secondary | ICD-10-CM | POA: Diagnosis not present

## 2023-01-04 ENCOUNTER — Ambulatory Visit (INDEPENDENT_AMBULATORY_CARE_PROVIDER_SITE_OTHER): Payer: Medicare Other | Admitting: Dermatology

## 2023-01-04 ENCOUNTER — Encounter: Payer: Self-pay | Admitting: Dermatology

## 2023-01-04 VITALS — BP 140/91 | HR 63

## 2023-01-04 DIAGNOSIS — L578 Other skin changes due to chronic exposure to nonionizing radiation: Secondary | ICD-10-CM

## 2023-01-04 DIAGNOSIS — L821 Other seborrheic keratosis: Secondary | ICD-10-CM | POA: Diagnosis not present

## 2023-01-04 DIAGNOSIS — L570 Actinic keratosis: Secondary | ICD-10-CM

## 2023-01-04 DIAGNOSIS — Z85818 Personal history of malignant neoplasm of other sites of lip, oral cavity, and pharynx: Secondary | ICD-10-CM

## 2023-01-04 DIAGNOSIS — R221 Localized swelling, mass and lump, neck: Secondary | ICD-10-CM | POA: Diagnosis not present

## 2023-01-04 NOTE — Patient Instructions (Addendum)
Thank you for visiting my office today. I appreciate your commitment to addressing your health concerns and am grateful for the opportunity to contribute to your care.  Based on our discussion and examination, here are the key instructions and recommendations:  - Consultation with Oncologist: It is crucial to follow up with an oncologist to evaluate the recent CT scan results and discuss potential biopsy of the subcutaneous nodules observed.  - Treatment for Actinic Keratosis: We treated the hypertrophic actinic keratosis on your right posterior shoulder and another on the angle of the mandible using cryotherapy. We will monitor the effectiveness of this treatment and possibly retreat in six months.  - Skin Care: Continue to monitor your skin for any new lesions. Immediate consultation is advised if new symptoms appear.  - Follow-Up Appointment: Please return to our clinic in six months to assess the treated areas and discuss any new developments.   We have documented today's findings and recommendations and will share them with your other healthcare providers to ensure coordinated care.  Please do not hesitate to contact our office if you have any questions or need further assistance before your next appointment.    Cryotherapy Aftercare  Wash gently with soap and water everyday.   Apply Vaseline and Band-Aid daily until healed.    Due to recent changes in healthcare laws, you may see results of your pathology and/or laboratory studies on MyChart before the doctors have had a chance to review them. We understand that in some cases there may be results that are confusing or concerning to you. Please understand that not all results are received at the same time and often the doctors may need to interpret multiple results in order to provide you with the best plan of care or course of treatment. Therefore, we ask that you please give [place] 2 business days to thoroughly review all your results  before contacting the office for clarification. Should we see a critical lab result, you will be contacted sooner.   If You Need Anything After Your Visit  If you have any questions or concerns for your doctor, please call our main line at [phone] If no one answers, please leave a voicemail as directed and we will return your call as soon as possible. Messages left after 4 pm will be answered the following business day.   You may also send [place] a message via MyChart. We typically respond to MyChart messages within 1-2 business days.  For prescription refills, please ask your pharmacy to contact our office. Our fax number is [phone].  If you have an urgent issue when the clinic is closed that cannot wait until the next business day, you can page your doctor at the number below.    Please note that while we do our best to be available for urgent issues outside of office hours, we are not available 24/7.   If you have an urgent issue and are unable to reach [place], you may choose to seek medical care at your doctor's office, retail clinic, urgent care center, or emergency room.  If you have a medical emergency, please immediately call 911 or go to the emergency department. In the event of inclement weather, please call our main line at [phone] for an update on the status of any delays or closures.  Dermatology Medication Tips: Please keep the boxes that topical medications come in in order to help keep track of the instructions about where and how to use these. Pharmacies typically  print the medication instructions only on the boxes and not directly on the medication tubes.   If your medication is too expensive, please contact our office at [phone] or send [place] a message through MyChart.   We are unable to tell what your co-pay for medications will be in advance as this is different depending on your insurance coverage. However, we may be able to find a substitute medication at  lower cost or fill out paperwork to get insurance to cover a needed medication.   If a prior authorization is required to get your medication covered by your insurance company, please allow [place] 1-2 business days to complete this process.  Drug prices often vary depending on where the prescription is filled and some pharmacies may offer cheaper prices.  The website www.goodrx.com contains coupons for medications through different pharmacies. The prices here do not account for what the cost may be with help from insurance (it may be cheaper with your insurance), but the website can give you the price if you did not use any insurance.  - You can print the associated coupon and take it with your prescription to the pharmacy.  - You may also stop by our office during regular business hours and pick up a GoodRx coupon card.  - If you need your prescription sent electronically to a different pharmacy, notify our office through [facility] or by phone at [phone]

## 2023-01-04 NOTE — Progress Notes (Signed)
New Patient Visit   Subjective  Jeffrey [name] is a 82 y.o. male who presents for the following: lump on the right side of neck   Patient states he has lump located at the right side of neck that He would like to have examined. Patient reports the areas have been there for about 2-3 months. He reports the areas is bothersome. He states that he had it evaluated by surgeon who removed a different lump which turned out to be a SCC in 2023. That doctor ordered a CT scan that showed he had Asymmetric skin thickening over the lower right neck the supraclavicular region superiorly to the level the hyoid that is most concerning for a skin cancer or cellulitis. Patient reports he previously been evaluated for this area. Patient has Hx of skin cancer --bx in 2020 showed AK. Patient has no  family history of skin cancer(s).    The following portions of the chart were reviewed this encounter and updated as appropriate: medications, allergies, medical history  Review of Systems:  No other skin or systemic complaints except as noted in HPI or Assessment and Plan.  Objective  Well appearing patient in no apparent distress; mood and affect are within normal limits.   A focused examination was performed of the following areas: neck  Relevant exam findings are noted in the Assessment and Plan.   Exam: No cutaneous lesions involving left neck but there is a 3cm nodular and submental firm nodules on right neck  Waxy brown papules on torso  both sides of (2) Pink kerototic papules           Assessment & Plan   1. 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 New subcutaneous nodules present.    - Plan: Refer the patient to an oncologist for further evaluation and discussion of the CT scan results. A lymph node biopsy may be considered to  confirm the presence of squamous cell carcinoma or identify other pathology.  2. Asymmetric Thickening of the Skin    - Assessment: No visible skin issues noted during examination, but patient reports concerns of skin cancer or cellulitis due to subcutaneous nodules.    - Plan: Patient should follow up with an oncologist for further evaluation and management of the subcutaneous nodules.  3. 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    - Plan: Perform cryotherapy with liquid nitrogen on the identified lesions. Follow up in six months to evaluate the treated areas and determine if retreatment is necessary.  4. Seborrheic Keratosis    - Assessment: Patient has benign seborrheic keratosis lesions.    - Plan: Reassure the patient about the benign nature of the lesions. Treatment is not required unless they become symptomatic or there are cosmetic concerns.  5. Mild Actinic Damage    - Assessment: Patient exhibits mild actinic damage.    - Plan: Advise the patient to implement sun protection measures, including the use of sunscreen and protective clothing, to prevent further damage.  6. Smoking History    - Assessment: Patient has a history of smoking.    - Plan: Emphasize the importance of smoking cessation to reduce the risk of recurrence or development of new squamous cell carcinoma.  7. Follow-up    - Plan: Schedule a follow-up appointment in six months to monitor the treated actinic keratosis lesions. Instruct the patient to return sooner if new skin lesions are noticed or if there are changes in existing lesions.     PROCEDURE NOTE AK (actinic keratosis) (2) both sides of  Destruction of lesion - both sides of (2) Complexity: simple   Destruction method: cryotherapy   Informed consent: discussed and consent obtained   Timeout:  patient name, date of  birth, surgical site, and procedure verified Lesion destroyed using liquid nitrogen: Yes   Region frozen until ice ball extended beyond lesion: Yes   Outcome: patient tolerated procedure well with no complications   Post-procedure details: wound care instructions given     Seborrheic Keratosis   Return in about 6 months (around 07/07/2023) for AK f/u.  I, Tillie Fantasia, CMA, am acting as scribe for [employer], DO.   Documentation: I have reviewed the above documentation for accuracy and completeness, and I agree with the above.  Langston Reusing, DO

## 2023-01-05 ENCOUNTER — Encounter: Payer: Self-pay | Admitting: Family Medicine

## 2023-01-17 ENCOUNTER — Ambulatory Visit (INDEPENDENT_AMBULATORY_CARE_PROVIDER_SITE_OTHER): Payer: Medicare Other | Admitting: Family Medicine

## 2023-01-17 ENCOUNTER — Encounter: Payer: Self-pay | Admitting: Family Medicine

## 2023-01-17 VITALS — BP 136/79 | HR 69 | Temp 97.1°F | Ht 69.0 in | Wt 164.0 lb

## 2023-01-17 DIAGNOSIS — I10 Essential (primary) hypertension: Secondary | ICD-10-CM

## 2023-01-17 DIAGNOSIS — E785 Hyperlipidemia, unspecified: Secondary | ICD-10-CM

## 2023-01-17 DIAGNOSIS — R234 Changes in skin texture: Secondary | ICD-10-CM

## 2023-01-17 NOTE — Assessment & Plan Note (Signed)
On Lipitor 40 mg daily. Check lipids when he comes back in for CPE in about 3 months.

## 2023-01-17 NOTE — Progress Notes (Signed)
   Jeffrey [name] is a 82 y.o. male who presents today for an office visit.  Assessment/Plan:  New/Acute Problems: Skin Thickening  He has already seen ENT and dermatology for this.  CT scan did show asymmetric skin thickening without any recurrence of his head and neck cancer.  Likely has some scar tissue related to his previous surgeries and radiation therapy due to his head and neck cancer from a couple of years ago.  It is reassuring that symptoms have been stable for the last few weeks.  A CT scan did mention potential skin cancer or cellulitis however has no visual or clinical evidence of this on exam today nor with his dermatologist.  We did discuss referral to plastic surgery for excision however given his reassuring imaging and stable symptoms be reasonable to continue with watchful waiting for now.  We did discuss reasons to return to care or seek emergent care.  He will follow-up with me in about 3 months.  He will let us know if any symptoms arise in the meantime.  Chronic Problems Addressed Today: Essential hypertension Blood pressure at goal on amlodipine 5 mg daily and losartan 25 mg twice daily.  HLD (hyperlipidemia) On Lipitor 40 mg daily. Check lipids when he comes back in for CPE in about 3 months.     Subjective:  HPI:  See Assessment / plan for status of chronic conditions.  Patient here today for follow-up.  We last saw him about 9 months ago for annual physical.  At that time was doing well.  He has been following with oncology and ENT for prior history of head neck cancer.  His main concern today is thickening of the skin on the right side of his neck. He initially went to ENT for this about 6 weeks ago. He got a CT scan which showed no lymphadenopathy no recurrence of any cancer however did show thickening of the skin on the right side of his neck lateral to thyroid.  His ENT doctor then referred him to dermatology for further evaluation.  Dermatology told him there  were no signs of cellulitis or skin cancer and he was advised to follow back up with oncology.  He has not had any change symptoms over the last week or so.       Objective:  Physical Exam: BP 136/79   Pulse 69   Temp (!) 97.1 F (36.2 C) (Temporal)   Ht 5\' 9"  (1.753 m)   Wt 164 lb (74.4 kg)   SpO2 98%   BMI 24.22 kg/m   Gen: No acute distress, resting comfortably CV: Regular rate and rhythm with no murmurs appreciated Pulm: Normal work of breathing, clear to auscultation bilaterally with no crackles, wheezes, or rhonchi Skin: Subtle thickening of the skin on right lower neck lateral to thyroid.  No pain on palpation.  No erythema.  No warmth.  No induration.  No dermal lesions noted. Neuro: Grossly normal, moves all extremities Psych: Normal affect and thought content      Derral Colucci M. Jimmey Ralph, MD 01/17/2023 2:29 PM

## 2023-01-17 NOTE — Patient Instructions (Signed)
It was very nice to see you today!  I think you have thickening of the skin probably due to your cancer treatment.  You do not have any signs of skin cancer or cellulitis.  Will continue to monitor for now.  Please let us know if you develop any new symptoms.  Return if symptoms worsen or fail to improve.   Take care, Dr Jimmey Ralph  PLEASE NOTE:  If you had any lab tests, please let us know if you have not heard back within a few days. You may see your results on mychart before we have a chance to review them but we will give you a call once they are reviewed by [place].   If we ordered any referrals today, please let us know if you have not heard from their office within the next week.   If you had any urgent prescriptions sent in today, please check with the pharmacy within an hour of our visit to make sure the prescription was transmitted appropriately.   Please try these tips to maintain a healthy lifestyle:  Eat at least 3 REAL meals and 1-2 snacks per day.  Aim for no more than 5 hours between eating.  If you eat breakfast, please do so within one hour of getting up.   Each meal should contain half fruits/vegetables, one quarter protein, and one quarter carbs (no bigger than a computer mouse)  Cut down on sweet beverages. This includes juice, soda, and sweet tea.   Drink at least 1 glass of water with each meal and aim for at least 8 glasses per day  Exercise at least 150 minutes every week.

## 2023-01-17 NOTE — Assessment & Plan Note (Signed)
Blood pressure at goal on amlodipine 5 mg daily and losartan 25 mg twice daily.

## 2023-01-20 DIAGNOSIS — R590 Localized enlarged lymph nodes: Secondary | ICD-10-CM | POA: Diagnosis not present

## 2023-01-20 DIAGNOSIS — R1312 Dysphagia, oropharyngeal phase: Secondary | ICD-10-CM | POA: Diagnosis not present

## 2023-01-31 ENCOUNTER — Other Ambulatory Visit: Payer: Self-pay

## 2023-01-31 DIAGNOSIS — I714 Abdominal aortic aneurysm, without rupture, unspecified: Secondary | ICD-10-CM

## 2023-01-31 DIAGNOSIS — Z9889 Other specified postprocedural states: Secondary | ICD-10-CM

## 2023-02-16 ENCOUNTER — Other Ambulatory Visit: Payer: Self-pay | Admitting: Cardiology

## 2023-02-16 DIAGNOSIS — I10 Essential (primary) hypertension: Secondary | ICD-10-CM

## 2023-02-21 ENCOUNTER — Inpatient Hospital Stay: Payer: Medicare Other | Attending: Hematology and Oncology | Admitting: Hematology and Oncology

## 2023-02-21 ENCOUNTER — Inpatient Hospital Stay: Payer: Medicare Other

## 2023-02-21 ENCOUNTER — Other Ambulatory Visit: Payer: Self-pay | Admitting: *Deleted

## 2023-02-21 VITALS — BP 145/68 | HR 65 | Temp 97.2°F | Resp 18 | Ht 69.0 in | Wt 160.5 lb

## 2023-02-21 DIAGNOSIS — D696 Thrombocytopenia, unspecified: Secondary | ICD-10-CM | POA: Insufficient documentation

## 2023-02-21 DIAGNOSIS — Z9221 Personal history of antineoplastic chemotherapy: Secondary | ICD-10-CM | POA: Insufficient documentation

## 2023-02-21 DIAGNOSIS — C109 Malignant neoplasm of oropharynx, unspecified: Secondary | ICD-10-CM

## 2023-02-21 DIAGNOSIS — Z85818 Personal history of malignant neoplasm of other sites of lip, oral cavity, and pharynx: Secondary | ICD-10-CM | POA: Diagnosis not present

## 2023-02-21 DIAGNOSIS — R634 Abnormal weight loss: Secondary | ICD-10-CM | POA: Insufficient documentation

## 2023-02-21 DIAGNOSIS — Z923 Personal history of irradiation: Secondary | ICD-10-CM | POA: Diagnosis not present

## 2023-02-21 DIAGNOSIS — D649 Anemia, unspecified: Secondary | ICD-10-CM

## 2023-02-21 LAB — CBC WITH DIFFERENTIAL (CANCER CENTER ONLY)
Abs Immature Granulocytes: 0.06 10*3/uL (ref 0.00–0.07)
Basophils Absolute: 0 10*3/uL (ref 0.0–0.1)
Basophils Relative: 1 %
Eosinophils Absolute: 0 10*3/uL (ref 0.0–0.5)
Eosinophils Relative: 0 %
HCT: 29 % — ABNORMAL LOW (ref 39.0–52.0)
Hemoglobin: 9.8 g/dL — ABNORMAL LOW (ref 13.0–17.0)
Immature Granulocytes: 1 %
Lymphocytes Relative: 10 %
Lymphs Abs: 0.6 10*3/uL — ABNORMAL LOW (ref 0.7–4.0)
MCH: 30.9 pg (ref 26.0–34.0)
MCHC: 33.8 g/dL (ref 30.0–36.0)
MCV: 91.5 fL (ref 80.0–100.0)
Monocytes Absolute: 0.8 10*3/uL (ref 0.1–1.0)
Monocytes Relative: 14 %
Neutro Abs: 4 10*3/uL (ref 1.7–7.7)
Neutrophils Relative %: 74 %
Platelet Count: 146 10*3/uL — ABNORMAL LOW (ref 150–400)
RBC: 3.17 MIL/uL — ABNORMAL LOW (ref 4.22–5.81)
RDW: 14.2 % (ref 11.5–15.5)
WBC Count: 5.4 10*3/uL (ref 4.0–10.5)
nRBC: 0 % (ref 0.0–0.2)

## 2023-02-21 LAB — CMP (CANCER CENTER ONLY)
ALT: 9 U/L (ref 0–44)
AST: 14 U/L — ABNORMAL LOW (ref 15–41)
Albumin: 4.1 g/dL (ref 3.5–5.0)
Alkaline Phosphatase: 91 U/L (ref 38–126)
Anion gap: 7 (ref 5–15)
BUN: 23 mg/dL (ref 8–23)
CO2: 25 mmol/L (ref 22–32)
Calcium: 9.6 mg/dL (ref 8.9–10.3)
Chloride: 98 mmol/L (ref 98–111)
Creatinine: 1.11 mg/dL (ref 0.61–1.24)
GFR, Estimated: >60 mL/min (ref >60)
Glucose, Bld: 90 mg/dL (ref 70–99)
Potassium: 4.4 mmol/L (ref 3.5–5.1)
Sodium: 130 mmol/L — ABNORMAL LOW (ref 135–145)
Total Bilirubin: 0.9 mg/dL (ref 0.3–1.2)
Total Protein: 7.9 g/dL (ref 6.5–8.1)

## 2023-02-21 LAB — FERRITIN: Ferritin: 387 ng/mL — ABNORMAL HIGH (ref 24–336)

## 2023-02-21 NOTE — Progress Notes (Signed)
[facility]   [phone] [phone]   Clinic Follow up Note   Patient Care Team: Ardith Dark, MD as PCP - General (Family Medicine) Jodelle Red, MD as PCP - Cardiology (Cardiology) Robbie Lis (Inactive) as Consulting Physician (Dentistry) Nada Libman, MD as Consulting Physician (Vascular Surgery) Glyn Ade, PA-C as Physician Assistant (Dermatology) Malmfelt, Lise Auer, RN as Oncology Nurse Navigator Lonie Peak, MD as Consulting Physician (Radiation Oncology) Newman Pies, MD as Consulting Physician (Otolaryngology) Rachel Moulds, MD as Consulting Physician (Hematology and Oncology) Erroll Luna, [facility] (Inactive) as Pharmacist (Pharmacist) Janalyn Harder, MD (Inactive) as Consulting Physician (Dermatology)  Date of Service:  02/21/2023  CHIEF COMPLAINT: f/u of oropharyngeal cancer after completion of chemoradiation.  SUMMARY OF ONCOLOGIC HISTORY: Oncology History Overview Note  Patient first noticed Jeffrey [name] have neck mass about 2 months prior Jeffrey [name] presentation. He denies any complaints except for neck mass, no difficulty swallowing, pain at the base of the tongue. He went Jeffrey [name] see his PCP who recommended an [place] which demonstrated hypoechoic mass at the right level II a highly concerning for an enlarged pathologic LN. He was referred Jeffrey [name] ENT and CT neck was ordered. CT neck showed enlarged right level 2 node, asymmetric soft tissue at the right tongue base, favored Jeffrey [name] reflect asymmetric tonsillar issue. 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 PET CT showed primary mucosal neoplasm in the right tongue base area demonstrating hypermetabolism.    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HCC)  10/21/2020 Initial Diagnosis   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HCC)   10/21/2020 Cancer Staging   Staging form: Pharynx - HPV-Mediated  Oropharynx, AJCC 8th Edition - Clinical stage from 10/21/2020: Stage I (cT1, cN1, cM0, p16+) - Signed by Rachel Moulds, MD on 10/21/2020 Stage prefix: Initial diagnosis   11/10/2020 - 12/24/2020 Chemotherapy   Patient is on Treatment Plan : HEAD/NECK Cisplatin q7d       CURRENT THERAPY:  None, completed chemotherapy. Surveillance  INTERVAL HISTORY:   Jeffrey Jeffrey [name] is here for a follow up of oropharyngeal cancer. He completed radiation on 12/29/2020.  He had 4 cycles of chemo, dose reduced, received a total dose of 100 mg per metered square. He couldn't tolerate any further chemotherapy End of treatment PET with complete response. He recently has seen Dr Suszanne Conners, no evidence of disease. No neuropathy, hearing changes. He also  had a CT scan of neck, no concern for disease. She is doing quite well overall, no concerns today for recurrence.  No new cough or chest pain or shortness of breath Rest of the pertinent 10 point ROS reviewed and negative.  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   Right Buccal Cheek(MOHS)   Osteopenia     SURGICAL HISTORY: Past Surgical History:  Procedure Laterality Date   ABDOMINAL AORTIC ENDOVASCULAR FENESTRATED STENT GRAFT N/A 03/21/2018   Procedure: ABDOMINAL AORTIC ENDOVASCULAR FENESTRATED STENT GRAFT ; DYNA CT PERFORMED;  Surgeon: Nada Libman, MD;  Location: [facility] OR;  Service: Vascular;  Laterality: N/A;   Basal Skin Cancer  2012  CATARACT EXTRACTION Bilateral    IR GASTROSTOMY TUBE MOD SED  11/06/2020   IR GASTROSTOMY TUBE REMOVAL  04/19/2021   IR IMAGING GUIDED PORT INSERTION  11/06/2020   IR REMOVAL TUN ACCESS W/ PORT W/O FL MOD SED  07/15/2021    MASS BIOPSY Right 09/25/2020   Procedure: OPEN NECK MASS BIOPSY;  Surgeon: Newman Pies, MD;  Location: [facility];  Service: ENT;  Laterality: Right;   MASS EXCISION     x 2; throat and above lip   TONSILLECTOMY     TRANSURETHRAL RESECTION OF PROSTATE      ALLERGIES:  is allergic Jeffrey [name] ketotifen fumarate, mixed ragweed, and triamcinolone.  MEDICATIONS:  Current Outpatient Medications  Medication Sig Dispense Refill   amLODipine (NORVASC) 5 MG tablet TAKE 1 TABLET(5 MG) BY MOUTH DAILY 90 tablet 1   ARTIFICIAL TEAR SOLUTION OP Place 1 drop into both eyes daily as needed (dry eyes).     Ascorbic Acid (VITAMIN C) 1000 MG tablet Take 1,000 mg by mouth daily.     aspirin EC 81 MG tablet Take 1 tablet (81 mg total) by mouth daily. Swallow whole. 90 tablet 3   atorvastatin (LIPITOR) 40 MG tablet TAKE 1 TABLET(40 MG) BY MOUTH DAILY AT 6 PM 90 tablet 2   Cyanocobalamin 1000 MCG TBCR Take 1,000 mcg by mouth daily.     Emollient (CERAVE) LOTN Apply 1 application topically daily as needed (after shower care).      ferrous sulfate 325 (65 FE) MG tablet Take 325 mg by mouth every other day.     GLUCOSAMINE-CHONDROITIN PO Take 1 tablet by mouth 2 (two) times daily.      losartan (COZAAR) 25 MG tablet TAKE 1 TABLET(25 MG) BY MOUTH TWICE DAILY 180 tablet 3   No current facility-administered medications for this visit.    PHYSICAL EXAMINATION: ECOG PERFORMANCE STATUS: 2 - Symptomatic, <50% confined Jeffrey [name] bed  Vitals:   02/21/23 1253  BP: (!) 145/68  Pulse: 65  Resp: 18  Temp: (!) 97.2 F (36.2 C)  SpO2: 100%    Physical Exam Constitutional:      Appearance: Normal appearance.  HENT:     Head: Normocephalic and atraumatic.     Mouth/Throat:     Comments: No tonsillar enlargement. Cardiovascular:     Rate and Rhythm: Normal rate and regular rhythm.  Pulmonary:     Effort: Pulmonary effort is normal.     Breath sounds: Normal breath sounds.  Abdominal:     General: Abdomen is  flat. Bowel sounds are normal.  Musculoskeletal:     Cervical back: Normal range of motion and neck supple.  Neurological:     General: No focal deficit present.     Mental Status: He is alert.  Psychiatric:        Mood and Affect: Mood normal.      LABORATORY DATA:  I have reviewed the data as listed    Latest Ref Rng & Units 02/21/2023   12:45 PM 02/17/2022   12:09 PM 08/19/2021   12:24 PM  CBC  WBC 4.0 - 10.5 K/uL 5.4  4.0    Hemoglobin 13.0 - 17.0 g/dL 9.8  01.0    Hematocrit 39.0 - 52.0 % 29.0  28.3  29.0   Platelets 150 - 400 K/uL 146  141          Latest Ref Rng & Units 02/17/2022   12:09 PM 08/19/2021   12:23 PM 07/07/2021  11:18 AM  CMP  Glucose 70 - 99 mg/dL 95  87  89   BUN 8 - 23 mg/dL 17  19  28    Creatinine 0.61 - 1.24 mg/dL 1.61  0.96  0.45   Sodium 135 - 145 mmol/L 129  128  130   Potassium 3.5 - 5.1 mmol/L 4.0  4.1  4.3   Chloride 98 - 111 mmol/L 97  97  97   CO2 22 - 32 mmol/L 27  24  25    Calcium 8.9 - 10.3 mg/dL 9.7  9.6  9.9   Total Protein 6.5 - 8.1 g/dL 7.8  7.3    Total Bilirubin 0.3 - 1.2 mg/dL 0.8  0.8    Alkaline Phos 38 - 126 U/L 78  64    AST 15 - 41 U/L 18  18    ALT 0 - 44 U/L 12  13      RADIOGRAPHIC STUDIES: I have personally reviewed the radiological images as listed and agreed with the findings in the report. No results found.   ASSESSMENT & PLAN:   Jeffrey Jeffrey [name] is a 82 y.o. male with SCC oropharynx  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HCC), TxN1M0, HPV(+) CT neck showed enlarged right level 2 node, asymmetric soft tissue at the right tongue base, favored Jeffrey [name] reflect asymmetric tonsillar issue. -Initial biopsy was inconclusive so he had an excisional biopsy which showed P16 positive right neck poorly differentiated SCC. -PET CT showed primary mucosal neoplasm in the right tongue base area demonstrating hypermetabolism. -He was started on concurrent chemoradiation therapy with cisplatin on 11/09/20. -He developed worsening  thrombocytopenia, anemia and leukopenia following cycle 2. Chemo has been held since 11/19/20. -He overall got 100 mg per metered square of cisplatin, multiple delays due Jeffrey [name] cytopenias in the last 2 cycles, he only got 20 mg per metered square.   End of treatment PET with no concern for residual disease.   -No concern for recurrence on exam today.  He saw Dr. Rae Lips recently, we have no notes Jeffrey [name] review however he also had some imaging of the neck which did not show any concern for local recurrence.  He denies any new cough, chest pain or shortness of breath concerning for metastatic disease in the lung.  At this time we will continue Jeffrey [name] follow him annually or as needed.  2. Chemotherapy-induced cytopenia He continues Jeffrey [name] have moderate anemia with hemoglobin of 9.8, no macrocytosis.  Mild thrombocytopenia which has improved overall.  No evidence of iron deficiency.  Will continue Jeffrey [name] monitor his labs at every visit.  #3.  Weight loss, this has overall stabilized, TSH pending from this visit.   Plan:  -Completed chemotherapy and radiation, not very well-tolerated. RTC in 18yr .    Rachel Moulds, MD 02/21/2023

## 2023-02-22 ENCOUNTER — Other Ambulatory Visit: Payer: Self-pay | Admitting: *Deleted

## 2023-02-22 ENCOUNTER — Ambulatory Visit
Admission: RE | Admit: 2023-02-22 | Discharge: 2023-02-22 | Disposition: A | Discharge status: Home / Self Care | Payer: Medicare Other | Source: Ambulatory Visit | Attending: Surgery | Admitting: Surgery

## 2023-02-22 ENCOUNTER — Other Ambulatory Visit: Payer: Self-pay

## 2023-02-22 ENCOUNTER — Encounter: Payer: Self-pay | Admitting: Hematology and Oncology

## 2023-02-22 DIAGNOSIS — I700 Atherosclerosis of aorta: Secondary | ICD-10-CM | POA: Diagnosis not present

## 2023-02-22 DIAGNOSIS — Z9889 Other specified postprocedural states: Secondary | ICD-10-CM | POA: Diagnosis not present

## 2023-02-22 DIAGNOSIS — C109 Malignant neoplasm of oropharynx, unspecified: Secondary | ICD-10-CM

## 2023-02-22 DIAGNOSIS — I714 Abdominal aortic aneurysm, without rupture, unspecified: Secondary | ICD-10-CM

## 2023-02-22 DIAGNOSIS — K5790 Diverticulosis of intestine, part unspecified, without perforation or abscess without bleeding: Secondary | ICD-10-CM | POA: Diagnosis not present

## 2023-02-22 LAB — TSH: TSH: 3.719 u[IU]/mL (ref 0.350–4.500)

## 2023-02-22 MED ORDER — IOPAMIDOL (ISOVUE-370) INJECTION 76%
75.0000 mL | Freq: Once | INTRAVENOUS | Status: AC | PRN
  Administered 2023-02-22: 75 mL via INTRAVENOUS

## 2023-02-27 ENCOUNTER — Ambulatory Visit (HOSPITAL_COMMUNITY)
Admission: RE | Admit: 2023-02-27 | Discharge: 2023-02-27 | Disposition: A | Discharge status: Home / Self Care | Payer: Medicare Other | Source: Ambulatory Visit | Attending: Surgery | Admitting: Surgery

## 2023-02-27 ENCOUNTER — Ambulatory Visit: Payer: Medicare Other | Admitting: Surgery

## 2023-02-27 ENCOUNTER — Encounter: Payer: Self-pay | Admitting: Surgery

## 2023-02-27 VITALS — BP 144/85 | HR 61 | Temp 98.2°F | Resp 20 | Ht 69.0 in | Wt 158.0 lb

## 2023-02-27 DIAGNOSIS — I7143 Infrarenal abdominal aortic aneurysm, without rupture: Secondary | ICD-10-CM | POA: Diagnosis not present

## 2023-02-27 DIAGNOSIS — I70213 Atherosclerosis of native arteries of extremities with intermittent claudication, bilateral legs: Secondary | ICD-10-CM | POA: Diagnosis not present

## 2023-02-27 DIAGNOSIS — Z923 Personal history of irradiation: Secondary | ICD-10-CM | POA: Insufficient documentation

## 2023-02-27 NOTE — Progress Notes (Signed)
Vascular and Vein Specialist of [facility]  Patient name: Jeffrey [name] MRN: [id number] DOB: 10-28-1940 Sex: male   REASON FOR VISIT:    Follow up  HISOTRY OF PRESENT ILLNESS:   Jeffrey [name] is a 82 y.o. male who returns today for follow up  He is status post FEVAR with a scallop to the superior mesenteric artery and bilateral renal stents on March 21, 2018.  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  He did have some left foot numbness following the procedure which improved with time.  He had a relatively silent occlusion of his left limb of his graft.  He rarely gets left leg symptoms.      He continues to take a statin for hypercholesterolemia.  He is on an ARB for hypertension.  He does take an aspirin.  He is a former smoker.  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   Right Buccal Cheek(MOHS)   Osteopenia      FAMILY HISTORY:   Family History  Problem Relation Age of Onset   Diabetes Mother    Hypertension Mother    Stroke Mother    Alcohol abuse Father    Heart attack Father    Heart disease Father    Hypertension Father    Hyperlipidemia Sister    Pancreatic cancer Sister    Pancreatic cancer Brother    Colon cancer Paternal Grandmother 66   Hypertension Son    Esophageal cancer Neg Hx    Stomach cancer Neg Hx    Liver disease Neg Hx     SOCIAL HISTORY:   Social History   Tobacco Use   Smoking status: Former    Current packs/day: 0.00    Average packs/day: 1 pack/day for 32.0 years (32.0 ttl pk-yrs)    Types: Cigarettes    Start date: 08/17/1958     Quit date: 08/17/1990    Years since quitting: 32.5   Smokeless tobacco: Never  Substance Use Topics   Alcohol use: Not Currently    Comment: Occassionally, "when I feel like it"     ALLERGIES:   Allergies  Allergen Reactions   Ketotifen Fumarate Swelling and Other (See Comments)    Eye swelling   Mixed Ragweed     Hay fever   Triamcinolone Hives and Rash     CURRENT MEDICATIONS:   Current Outpatient Medications  Medication Sig Dispense Refill   amLODipine (NORVASC) 5 MG tablet TAKE 1 TABLET(5 MG) BY MOUTH DAILY 90 tablet 1   ARTIFICIAL TEAR SOLUTION OP Place 1 drop into both eyes daily as needed (dry eyes).     Ascorbic Acid (VITAMIN C) 1000 MG tablet Take 1,000 mg by mouth daily.     aspirin EC 81 MG tablet Take 1 tablet (81 mg total) by mouth daily. Swallow whole. 90 tablet 3   atorvastatin (LIPITOR) 40 MG tablet TAKE 1 TABLET(40 MG) BY MOUTH DAILY AT 6 PM 90 tablet 2   Cyanocobalamin 1000 MCG TBCR Take 1,000 mcg by mouth daily.     Emollient (CERAVE)  LOTN Apply 1 application topically daily as needed (after shower care).      ferrous sulfate 325 (65 FE) MG tablet Take 325 mg by mouth every other day.     GLUCOSAMINE-CHONDROITIN PO Take 1 tablet by mouth 2 (two) times daily.      losartan (COZAAR) 25 MG tablet TAKE 1 TABLET(25 MG) BY MOUTH TWICE DAILY 180 tablet 3   No current facility-administered medications for this visit.    REVIEW OF SYSTEMS:   [X]  denotes positive finding, [ ]  denotes negative finding Cardiac  Comments:  Chest pain or chest pressure:    Shortness of breath upon exertion:    Short of breath when lying flat:    Irregular heart rhythm:        Vascular    Pain in calf, thigh, or hip brought on by ambulation:    Pain in feet at night that wakes you up from your sleep:     Blood clot in your veins:    Leg swelling:         Pulmonary    Oxygen at home:    Productive cough:     Wheezing:         Neurologic    Sudden weakness in arms or  legs:     Sudden numbness in arms or legs:     Sudden onset of difficulty speaking or slurred speech:    Temporary loss of vision in one eye:     Problems with dizziness:         Gastrointestinal    Blood in stool:     Vomited blood:         Genitourinary    Burning when urinating:     Blood in urine:        Psychiatric    Major depression:         Hematologic    Bleeding problems:    Problems with blood clotting too easily:        Skin    Rashes or ulcers:        Constitutional    Fever or chills:      PHYSICAL EXAM:   Vitals:   02/27/23 1333 02/27/23 1334  BP: (!) 153/87 (!) 144/85  Pulse: 61   Resp: 20   Temp: 98.2 F (36.8 C)   SpO2: 98%   Weight: 158 lb (71.7 kg)   Height: 5\' 9"  (1.753 m)     GENERAL: The patient is a well-nourished male, in no acute distress. The vital signs are documented above. CARDIAC: There is a regular rate and rhythm.  PULMONARY: Non-labored respirations ABDOMEN: Soft and non-tender with normal pitched bowel sounds.  MUSCULOSKELETAL: There are no major deformities or cyanosis. NEUROLOGIC: No focal weakness or paresthesias are detected. SKIN: There are no ulcers or rashes noted. PSYCHIATRIC: The patient has a normal affect.  STUDIES:   I have reviewed the following: CTA:  1. 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 2. 5 cm infrarenal abdominal aortic aneurysm (previously 4.1 cm). I telephoned the critical test results to Dr. Myra Gianotti at the time of interpretation. 3. Cholelithiasis. 4. Colonic diverticulosis. 5. Chronic thoracic vertebral compression deformities. 6. Aortic Atherosclerosis (ICD10-I70.0).  Carotid: Right Carotid: Velocities in the right ICA are consistent with a 1-39%  stenosis.   Left Carotid: Velocities in the left ICA are consistent with a 1-39%  stenosis.   Vertebrals: Bilateral vertebral arteries demonstrate antegrade flow.  Subclavians:  Normal flow hemodynamics were seen in bilateral subclavian               arteries.  MEDICAL ISSUES:   Carotid: Ultrasound today shows no significant disease  Aneurysm: There has been a increase in size of his aneurysm.  Maximum diameter is 5 cm with a type II endoleak.  I discussed this with the patient.  We will continue to monitor this for now.  If the aneurysm continues to enlarge we we will consider embolization.  I will get a CT scan in 6 months  The patient has a known left iliac limb occlusion.  He has minimal interruption to his daily activities with this.  We discussed monitoring his leg for progressive symptoms or nonhealing wounds and to contact me should these occur.    Charlena Cross, MD, FACS [facility] Specialists of [facility] [phone] Pager [phone]

## 2023-03-30 ENCOUNTER — Encounter: Payer: Self-pay | Admitting: Family Medicine

## 2023-04-04 ENCOUNTER — Encounter: Payer: Self-pay | Admitting: Hematology and Oncology

## 2023-04-04 NOTE — Telephone Encounter (Signed)
Telephone call  

## 2023-04-10 DIAGNOSIS — K080 Exfoliation of teeth due to systemic causes: Secondary | ICD-10-CM | POA: Diagnosis not present

## 2023-04-11 IMAGING — XA IR IMAGING GUIDED PORT INSERTION
1 series · 1 of 1 positions shown · non-contrast
Comparison: none

CLINICAL DATA: Head neck squamous cell carcinoma, access for
chemotherapy

[Series 2: fl (-) angio · 1 of 1 slices shown]
[im 1/1]
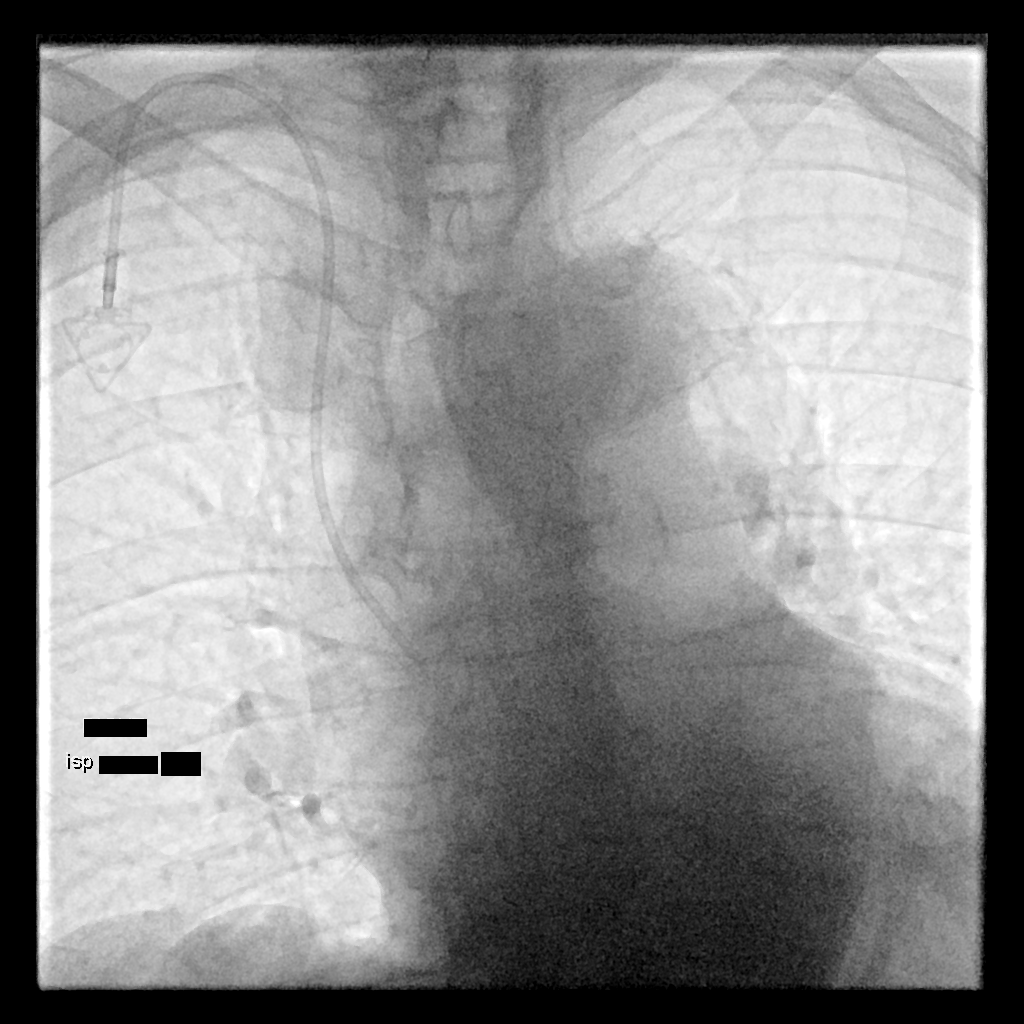

[1 of 1 positions shown; findings below may reference images not displayed]

EXAM:
RIGHT INTERNAL JUGULAR SINGLE LUMEN POWER PORT CATHETER INSERTION

Radiologist:  Blain, Oglev

Guidance:  Ultrasound and fluoroscopic

MEDICATIONS:
Ancef 2 g for both procedures; The antibiotic was administered
within an appropriate time interval prior to skin puncture.

ANESTHESIA/SEDATION:
Versed 1.0 mg IV; Fentanyl 50 mcg IV;

Moderate Sedation Time:  27 minutes

The patient was continuously monitored during the procedure by the
interventional radiology nurse under my direct supervision.

FLUOROSCOPY TIME:  0 minutes, 54 seconds (5 mGy)

COMPLICATIONS:
None immediate.

CONTRAST:  None.

PROCEDURE:
Informed consent was obtained from the patient following explanation
of the procedure, risks, benefits and alternatives. The patient
understands, agrees and consents for the procedure. All questions
were addressed. A time out was performed.

Maximal barrier sterile technique utilized including caps, mask,
sterile gowns, sterile gloves, large sterile drape, hand hygiene,
and 2% chlorhexidine scrub.

Under sterile conditions and local anesthesia, right internal
jugular micropuncture venous access was performed. Access was
performed with ultrasound. Images were obtained for documentation of
the patent right internal jugular vein. A guide wire was inserted
followed by a transitional dilator. This allowed insertion of a
guide wire and catheter into the IVC. Measurements were obtained
from the SVC / RA junction back to the right IJ venotomy site. In
the right infraclavicular chest, a subcutaneous pocket was created
over the second anterior rib. This was done under sterile conditions
and local anesthesia. 1% lidocaine with epinephrine was utilized for
this. A 2.5 cm incision was made in the skin. Blunt dissection was
performed to create a subcutaneous pocket over the right pectoralis
major muscle. The pocket was flushed with saline vigorously. There
was adequate hemostasis. The port catheter was assembled and checked
for leakage. The port catheter was secured in the pocket with two
retention sutures. The tubing was tunneled subcutaneously to the
right venotomy site and inserted into the SVC/RA junction through a
valved peel-away sheath. Position was confirmed with fluoroscopy.
Images were obtained for documentation. The patient tolerated the
procedure well. No immediate complications. Incisions were closed in
a two layer fashion with 4 - 0 Vicryl suture. Dermabond was applied
to the skin. The port catheter was accessed, blood was aspirated
followed by saline and heparin flushes. Needle was removed. A dry
sterile dressing was applied.
IMPRESSION: Ultrasound and fluoroscopically guided right internal jugular single
lumen power port catheter insertion. Tip in the SVC/RA junction.
Catheter ready for use.

## 2023-04-17 ENCOUNTER — Encounter: Payer: Self-pay | Admitting: Family Medicine

## 2023-04-17 ENCOUNTER — Ambulatory Visit (INDEPENDENT_AMBULATORY_CARE_PROVIDER_SITE_OTHER): Payer: Medicare Other | Admitting: Family Medicine

## 2023-04-17 VITALS — BP 126/80 | HR 60 | Temp 98.0°F | Ht 69.0 in | Wt 160.2 lb

## 2023-04-17 DIAGNOSIS — I10 Essential (primary) hypertension: Secondary | ICD-10-CM | POA: Diagnosis not present

## 2023-04-17 DIAGNOSIS — Z0001 Encounter for general adult medical examination with abnormal findings: Secondary | ICD-10-CM

## 2023-04-17 DIAGNOSIS — I714 Abdominal aortic aneurysm, without rupture, unspecified: Secondary | ICD-10-CM

## 2023-04-17 DIAGNOSIS — Z23 Encounter for immunization: Secondary | ICD-10-CM | POA: Diagnosis not present

## 2023-04-17 DIAGNOSIS — E785 Hyperlipidemia, unspecified: Secondary | ICD-10-CM | POA: Diagnosis not present

## 2023-04-17 DIAGNOSIS — I739 Peripheral vascular disease, unspecified: Secondary | ICD-10-CM

## 2023-04-17 DIAGNOSIS — R739 Hyperglycemia, unspecified: Secondary | ICD-10-CM | POA: Diagnosis not present

## 2023-04-17 LAB — COMPREHENSIVE METABOLIC PANEL
ALT: 12 U/L (ref 0–53)
AST: 16 U/L (ref 0–37)
Albumin: 4 g/dL (ref 3.5–5.2)
Alkaline Phosphatase: 99 U/L (ref 39–117)
BUN: 23 mg/dL (ref 6–23)
CO2: 25 meq/L (ref 19–32)
Calcium: 9.3 mg/dL (ref 8.4–10.5)
Chloride: 96 meq/L (ref 96–112)
Creatinine, Ser: 1.06 mg/dL (ref 0.40–1.50)
GFR: 65.51 mL/min (ref >60.00)
Glucose, Bld: 90 mg/dL (ref 70–99)
Potassium: 4 meq/L (ref 3.5–5.1)
Sodium: 128 meq/L — ABNORMAL LOW (ref 135–145)
Total Bilirubin: 0.8 mg/dL (ref 0.2–1.2)
Total Protein: 7.5 g/dL (ref 6.0–8.3)

## 2023-04-17 LAB — LIPID PANEL
Cholesterol: 114 mg/dL (ref 0–200)
HDL: 62.9 mg/dL (ref >39.00)
LDL Cholesterol: 44 mg/dL (ref 0–99)
NonHDL: 51.42
Total CHOL/HDL Ratio: 2
Triglycerides: 38 mg/dL (ref 0.0–149.0)
VLDL: 7.6 mg/dL (ref 0.0–40.0)

## 2023-04-17 LAB — TSH: TSH: 3.05 u[IU]/mL (ref 0.35–5.50)

## 2023-04-17 LAB — HEMOGLOBIN A1C: Hgb A1c MFr Bld: 4.8 % (ref 4.6–6.5)

## 2023-04-17 LAB — CBC
HCT: 29.1 % — ABNORMAL LOW (ref 39.0–52.0)
Hemoglobin: 9.8 g/dL — ABNORMAL LOW (ref 13.0–17.0)
MCHC: 33.9 g/dL (ref 30.0–36.0)
MCV: 92.2 fL (ref 78.0–100.0)
Platelets: 157 10*3/uL (ref 150.0–400.0)
RBC: 3.15 Mil/uL — ABNORMAL LOW (ref 4.22–5.81)
RDW: 14.7 % (ref 11.5–15.5)
WBC: 5.7 10*3/uL (ref 4.0–10.5)

## 2023-04-17 NOTE — Assessment & Plan Note (Signed)
On Lipitor 40 mg daily.  Check lipids today.

## 2023-04-17 NOTE — Assessment & Plan Note (Signed)
Initially elevated but at goal on recheck.  Continue amlodipine 5 mg daily and losartan 25 mg twice daily.

## 2023-04-17 NOTE — Progress Notes (Signed)
Chief Complaint:  Jeffrey [name] is a 82 y.o. male who presents today for his annual comprehensive physical exam.    Assessment/Plan:  Chronic Problems Addressed Today: Essential hypertension Initially elevated but at goal on recheck.  Continue amlodipine 5 mg daily and losartan 25 mg twice daily.  HLD (hyperlipidemia) On Lipitor 40 mg daily.  Check lipids today.  Hyperglycemia Check A1c.   Abdominal aortic aneurysm (AAA) without rupture (HCC) Continue management per vascular surgery.  He has been having some issues with intermittent claudication.  Currently on aspirin and statin.    Intermittent claudication (HCC) Continue management per vascular surgery.  On aspirin and statin.  We discussed routine exercise.  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  Preventative Healthcare: Flu shot given. Check labs.   Patient Counseling(The following topics were reviewed and/or handout was given):  -Nutrition: Stressed importance of moderation in sodium/caffeine intake, saturated fat and cholesterol, caloric balance, sufficient intake of fresh fruits, vegetables, and fiber.  -Stressed the importance of regular exercise.   -Substance Abuse: Discussed cessation/primary prevention of tobacco, alcohol, or other drug use; driving or other dangerous activities under the influence; availability of treatment for abuse.   -Injury prevention: Discussed safety belts, safety helmets, smoke detector, smoking near bedding or upholstery.   -Sexuality: Discussed sexually transmitted diseases, partner selection, use of condoms, avoidance of unintended pregnancy and contraceptive alternatives.   -Dental health: Discussed importance of regular tooth brushing, flossing, and dental visits.  -Health maintenance and immunizations reviewed. Please refer to Health maintenance section.  Return to care in 1 year for next preventative visit.     Subjective:  HPI:  He has no acute complaints  today. See Assessment / plan for status of chronic conditions.   Lifestyle Diet: None specific.  Exercise: None specific.      04/17/2023    1:01 PM  Depression screen PHQ 2/9  Decreased Interest 0  Down, Depressed, Hopeless 0  PHQ - 2 Score 0    Health Maintenance Due  Topic Date Due   Medicare Annual Wellness (AWV)  04/26/2023     ROS: Per HPI, otherwise a complete review of systems was negative.   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   Right Buccal Cheek(MOHS)   Osteopenia    Patient Active Problem List   Diagnosis Date Noted   Intermittent claudication (HCC) 04/17/2023   Mucositis due to antineoplastic therapy 11/26/2020   Drug induced constipation 11/26/2020   Normocytic normochromic anemia 11/19/2020   Port-A-Cath in place 11/09/2020   History of drug allergy 11/09/2020   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   Hyperglycemia 04/09/2020   Chronic neck pain 04/09/2020   Coronary artery disease due to calcified coronary lesion 12/27/2018   Osteopenia 02/24/2017   Abdominal aortic aneurysm (AAA) without rupture (HCC) 07/06/2015   Basal cell carcinoma 07/06/2015   Essential hypertension 07/06/2015   History of colon polyps 07/06/2015   HLD (hyperlipidemia) 07/06/2015   Past Surgical History:  Procedure Laterality Date   ABDOMINAL AORTIC ENDOVASCULAR FENESTRATED  STENT GRAFT N/A 03/21/2018   Procedure: ABDOMINAL AORTIC ENDOVASCULAR FENESTRATED STENT GRAFT ; DYNA CT PERFORMED;  Surgeon: Nada Libman, MD;  Location: [facility] OR;  Service: Vascular;   Laterality: N/A;   Basal Skin Cancer  2012   CATARACT EXTRACTION Bilateral    IR GASTROSTOMY TUBE MOD SED  11/06/2020   IR GASTROSTOMY TUBE REMOVAL  04/19/2021   IR IMAGING GUIDED PORT INSERTION  11/06/2020   IR REMOVAL TUN ACCESS W/ PORT W/O FL MOD SED  07/15/2021   MASS BIOPSY Right 09/25/2020   Procedure: OPEN NECK MASS BIOPSY;  Surgeon: Newman Pies, MD;  Location: [facility];  Service: ENT;  Laterality: Right;   MASS EXCISION     x 2; throat and above lip   TONSILLECTOMY     TRANSURETHRAL RESECTION OF PROSTATE      Family History  Problem Relation Age of Onset   Diabetes Mother    Hypertension Mother    Stroke Mother    Alcohol abuse Father    Heart attack Father    Heart disease Father    Hypertension Father    Hyperlipidemia Sister    Pancreatic cancer Sister    Pancreatic cancer Brother    Colon cancer Paternal Grandmother 14   Hypertension Son    Esophageal cancer Neg Hx    Stomach cancer Neg Hx    Liver disease Neg Hx     Medications- reviewed and updated Current Outpatient Medications  Medication Sig Dispense Refill   amLODipine (NORVASC) 5 MG tablet TAKE 1 TABLET(5 MG) BY MOUTH DAILY 90 tablet 1   ARTIFICIAL TEAR SOLUTION OP Place 1 drop into both eyes daily as needed (dry eyes).     aspirin EC 81 MG tablet Take 1 tablet (81 mg total) by mouth daily. Swallow whole. 90 tablet 3   atorvastatin (LIPITOR) 40 MG tablet TAKE 1 TABLET(40 MG) BY MOUTH DAILY AT 6 PM 90 tablet 2   Emollient (CERAVE) LOTN Apply 1 application topically daily as needed (after shower care).      losartan (COZAAR) 25 MG tablet TAKE 1 TABLET(25 MG) BY MOUTH TWICE DAILY 180 tablet 3   No current facility-administered medications for this visit.    Allergies-reviewed and updated Allergies  Allergen Reactions   Ketotifen Fumarate Swelling and Other (See Comments)    Eye swelling   Mixed Ragweed     Hay fever   Triamcinolone Hives and Rash    Social History    Socioeconomic History   Marital status: Divorced    Spouse name: Not on file   Number of children: 1   Years of education: Not on file   Highest education level: Associate degree: academic program  Occupational History   Occupation: Retired    Associate Professor: Technical sales [employer]  Tobacco Use   Smoking status: Former    Current packs/day: 0.00    Average packs/day: 1 pack/day for 32.0 years (32.0 ttl pk-yrs)    Types: Cigarettes    Start date: 08/17/1958    Quit date: 08/17/1990    Years since quitting: 32.6   Smokeless tobacco: Never  Vaping Use   Vaping status: Never Used  Substance and Sexual Activity   Alcohol use: Not Currently    Comment: Occassionally, "when I feel like it"   Drug use: No   Sexual activity: Yes    Partners: Female  Other Topics Concern   Not on file  Social History Narrative   Not on file  Social Determinants of Health   Financial Resource Strain: Low Risk  (01/13/2023)   Overall Financial Resource Strain (CARDIA)    Difficulty of Paying Living Expenses: Not hard at all  Food Insecurity: No Food Insecurity (01/13/2023)   Hunger Vital Sign    Worried About Running Out of Food in the Last Year: Never true    Ran Out of Food in the Last Year: Never true  Transportation Needs: No Transportation Needs (01/13/2023)   PRAPARE - Administrator, Civil Service (Medical): No    Lack of Transportation (Non-Medical): No  Physical Activity: Insufficiently Active (01/13/2023)   Exercise Vital Sign    Days of Exercise per Week: 2 days    Minutes of Exercise per Session: 10 min  Stress: No Stress Concern Present (01/13/2023)   [employer] of Occupational Health - Occupational Stress Questionnaire    Feeling of Stress : Not at all  Social Connections: Socially Isolated (01/13/2023)   Social Connection and Isolation Panel [NHANES]    Frequency of Communication with Friends and Family: Once a week    Frequency of Social Gatherings with Friends and Family:  Once a week    Attends Religious Services: Never    Database administrator or Organizations: No    Attends Engineer, structural: 1 to 4 times per year    Marital Status: Divorced        Objective:  Physical Exam: BP 126/80   Pulse 60   Temp 98 F (36.7 C) (Temporal)   Ht 5\' 9"  (1.753 m)   Wt 160 lb 3.2 oz (72.7 kg)   SpO2 95%   BMI 23.66 kg/m   Body mass index is 23.66 kg/m. Wt Readings from Last 3 Encounters:  04/17/23 160 lb 3.2 oz (72.7 kg)  02/27/23 158 lb (71.7 kg)  02/21/23 160 lb 8 oz (72.8 kg)  Gen: NAD, resting comfortably HEENT: TMs normal bilaterally. OP clear. No thyromegaly noted.  CV: RRR with no murmurs appreciated Pulm: NWOB, CTAB with no crackles, wheezes, or rhonchi GI: Normal bowel sounds present. Soft, Nontender, Nondistended. MSK: no edema, cyanosis, or clubbing noted Skin: warm, dry Neuro: CN2-12 grossly intact. Strength 5/5 in upper and lower extremities. Reflexes symmetric and intact bilaterally.  Psych: Normal affect and thought content     Syesha Thaw M. Jimmey Ralph, MD 04/17/2023 1:43 PM

## 2023-04-17 NOTE — Assessment & Plan Note (Signed)
Continue management per vascular surgery.  On aspirin and statin.  We discussed routine exercise.

## 2023-04-17 NOTE — Assessment & Plan Note (Signed)
Check A1c. 

## 2023-04-17 NOTE — Assessment & Plan Note (Addendum)
Continue management per vascular surgery.  He has been having some issues with intermittent claudication.  Currently on aspirin and statin.

## 2023-04-17 NOTE — Patient Instructions (Signed)
It was very nice to see you today!  We we will check blood work today.  Give your flu shot today.  Please continue to work on diet and exercise.  Return in about 1 year (around 04/16/2024) for Annual Physical.   Take care, Dr Jimmey Ralph  PLEASE NOTE:  If you had any lab tests, please let us know if you have not heard back within a few days. You may see your results on mychart before we have a chance to review them but we will give you a call once they are reviewed by [place].   If we ordered any referrals today, please let us know if you have not heard from their office within the next week.   If you had any urgent prescriptions sent in today, please check with the pharmacy within an hour of our visit to make sure the prescription was transmitted appropriately.   Please try these tips to maintain a healthy lifestyle:  Eat at least 3 REAL meals and 1-2 snacks per day.  Aim for no more than 5 hours between eating.  If you eat breakfast, please do so within one hour of getting up.   Each meal should contain half fruits/vegetables, one quarter protein, and one quarter carbs (no bigger than a computer mouse)  Cut down on sweet beverages. This includes juice, soda, and sweet tea.   Drink at least 1 glass of water with each meal and aim for at least 8 glasses per day  Exercise at least 150 minutes every week.    Preventive Care 62 Years and Older, Male Preventive care refers to lifestyle choices and visits with your health care provider that can promote health and wellness. Preventive care visits are also called wellness exams. What can I expect for my preventive care visit? Counseling During your preventive care visit, your health care provider may ask about your: Medical history, including: Past medical problems. Family medical history. History of falls. Current health, including: Emotional well-being. Home life and relationship well-being. Sexual activity. Memory and ability to  understand (cognition). Lifestyle, including: Alcohol, nicotine or tobacco, and drug use. Access to firearms. Diet, exercise, and sleep habits. Work and work Astronomer. Sunscreen use. Safety issues such as seatbelt and bike helmet use. Physical exam Your health care provider will check your: Height and weight. These may be used to calculate your BMI (body mass index). BMI is a measurement that tells if you are at a healthy weight. Waist circumference. This measures the distance around your waistline. This measurement also tells if you are at a healthy weight and may help predict your risk of certain diseases, such as type 2 diabetes and high blood pressure. Heart rate and blood pressure. Body temperature. Skin for abnormal spots. What immunizations do I need?  Vaccines are usually given at various ages, according to a schedule. Your health care provider will recommend vaccines for you based on your age, medical history, and lifestyle or other factors, such as travel or where you work. What tests do I need? Screening Your health care provider may recommend screening tests for certain conditions. This may include: Lipid and cholesterol levels. Diabetes screening. This is done by checking your blood sugar (glucose) after you have not eaten for a while (fasting). Hepatitis C test. Hepatitis B test. HIV (human immunodeficiency virus) test. STI (sexually transmitted infection) testing, if you are at risk. Lung cancer screening. Colorectal cancer screening. Prostate cancer screening. Abdominal aortic aneurysm (AAA) screening. You may need this if  you are a current or former smoker. Talk with your health care provider about your test results, treatment options, and if necessary, the need for more tests. Follow these instructions at home: Eating and drinking  Eat a diet that includes fresh fruits and vegetables, whole grains, lean protein, and low-fat dairy products. Limit your intake of  foods with high amounts of sugar, saturated fats, and salt. Take vitamin and mineral supplements as recommended by your health care provider. Do not drink alcohol if your health care provider tells you not to drink. If you drink alcohol: Limit how much you have to 0-2 drinks a day. Know how much alcohol is in your drink. In the [place] one drink equals one 12 oz bottle of beer (355 mL), one 5 oz glass of wine (148 mL), or one 1 oz glass of hard liquor (44 mL). Lifestyle Brush your teeth every morning and night with fluoride toothpaste. Floss one time each day. Exercise for at least 30 minutes 5 or more days each week. Do not use any products that contain nicotine or tobacco. These products include cigarettes, chewing tobacco, and vaping devices, such as e-cigarettes. If you need help quitting, ask your health care provider. Do not use drugs. If you are sexually active, practice safe sex. Use a condom or other form of protection to prevent STIs. Take aspirin only as told by your health care provider. Make sure that you understand how much to take and what form to take. Work with your health care provider to find out whether it is safe and beneficial for you to take aspirin daily. Ask your health care provider if you need to take a cholesterol-lowering medicine (statin). Find healthy ways to manage stress, such as: Meditation, yoga, or listening to music. Journaling. Talking to a trusted person. Spending time with friends and family. Safety Always wear your seat belt while driving or riding in a vehicle. Do not drive: If you have been drinking alcohol. Do not ride with someone who has been drinking. When you are tired or distracted. While texting. If you have been using any mind-altering substances or drugs. Wear a helmet and other protective equipment during sports activities. If you have firearms in your house, make sure you follow all gun safety procedures. Minimize exposure to UV  radiation to reduce your risk of skin cancer. What's next? Visit your health care provider once a year for an annual wellness visit. Ask your health care provider how often you should have your eyes and teeth checked. Stay up to date on all vaccines. This information is not intended to replace advice given to you by your health care provider. Make sure you discuss any questions you have with your health care provider. Document Revised: 12/02/2020 Document Reviewed: 12/02/2020 Elsevier Patient Education  2024 [employer].

## 2023-04-18 NOTE — Progress Notes (Signed)
His sodium is low but stable compared to previous values.  Blood counts are also low but stable compared to previous.  The rest of his labs are all at goal.  Do not need to make any changes to his treatment plan at this time.  We can recheck in 6 to 12 months.

## 2023-05-29 ENCOUNTER — Encounter: Payer: Self-pay | Admitting: Family Medicine

## 2023-05-29 NOTE — Telephone Encounter (Signed)
Please advise 

## 2023-05-29 NOTE — Telephone Encounter (Signed)
Ok to refer to orthopedics however recommend he come in for an office visit if he is having significant pain still.  Katina Degree. Jimmey Ralph, MD 05/29/2023 12:56 PM

## 2023-05-30 NOTE — Telephone Encounter (Signed)
Patient is scheduled for OV 05/31/23

## 2023-05-31 ENCOUNTER — Encounter: Payer: Self-pay | Admitting: Family Medicine

## 2023-05-31 ENCOUNTER — Ambulatory Visit (INDEPENDENT_AMBULATORY_CARE_PROVIDER_SITE_OTHER): Payer: Medicare Other | Admitting: Family Medicine

## 2023-05-31 VITALS — BP 126/77 | HR 67 | Temp 98.4°F | Ht 69.0 in | Wt 158.6 lb

## 2023-05-31 DIAGNOSIS — M549 Dorsalgia, unspecified: Secondary | ICD-10-CM | POA: Diagnosis not present

## 2023-05-31 DIAGNOSIS — I10 Essential (primary) hypertension: Secondary | ICD-10-CM | POA: Diagnosis not present

## 2023-05-31 MED ORDER — MELOXICAM 15 MG PO TABS: 15.0000 mg | ORAL_TABLET | Freq: Every day | ORAL | 0 refills | Status: DC

## 2023-05-31 MED ORDER — TRAMADOL HCL 50 MG PO TABS
50.0000 mg | ORAL_TABLET | Freq: Three times a day (TID) | ORAL | 0 refills | Status: AC | PRN
Start: 2023-05-31 — End: 2023-06-05

## 2023-05-31 NOTE — Assessment & Plan Note (Signed)
Blood pressure goal today on amlodipine 5 mg daily and losartan 25 mg twice daily.

## 2023-05-31 NOTE — Assessment & Plan Note (Addendum)
Patient with recurrent back pain for the past several years however has had a severe flare within the last few days.  He has seen sports medicine for this previously and was eventually referred to see a spinal specialist however did not follow-up with them.    Unclear etiology for his recent worsening though concern for possible compression fracture given his prior history of this.  He does have point tenderness over lower thoracic spinous processes today.    We did discuss obtaining plain films however we will be urgently referring to a spine specialist and they can obtain x-rays at their office as we would likely not get a read back on imaging by the time he sees them.  His exam today is overall reassuring without any signs of nerve involvement or radicular symptoms.  We are treating his acute pain with meloxicam 15 mg daily and tramadol 50 mg every 8 hours as needed.  He can follow-up with [place] in a few days if he is not seeing the spine specialist by then.  He will let us know if he has any change in symptoms especially worsening pain or development of any neurologic symptoms.

## 2023-05-31 NOTE — Progress Notes (Signed)
   Jeffrey [name] is a 82 y.o. male who presents today for an office visit.  Assessment/Plan:  Chronic Problems Addressed Today: Back pain Patient with recurrent back pain for the past several years however has had a severe flare within the last few days.  He has seen sports medicine for this previously and was eventually referred to see a spinal specialist however did not follow-up with them.    Unclear etiology for his recent worsening though concern for possible compression fracture given his prior history of this.  He does have point tenderness over lower thoracic spinous processes today.    We did discuss obtaining plain films however we will be urgently referring to a spine specialist and they can obtain x-rays at their office as we would likely not get a read back on imaging by the time he sees them.  His exam today is overall reassuring without any signs of nerve involvement or radicular symptoms.  We are treating his acute pain with meloxicam 15 mg daily and tramadol 50 mg every 8 hours as needed.  He can follow-up with [place] in a few days if he is not seeing the spine specialist by then.  He will let us know if he has any change in symptoms especially worsening pain or development of any neurologic symptoms.  Essential hypertension Blood pressure goal today on amlodipine 5 mg daily and losartan 25 mg twice daily.     Subjective:  HPI:  See A/P for status of chronic conditions.  Patient is here today with 4 days of back pain. Pain is located in his "entire spine" but mostly in low mid back. This has been happening intermittently for the last 18 years since suffering an accident in which he fell out of a tree. He has previously seen sports medicine and xrays from a couple of years ago that showed compression fractures and degenerative changes.  Symptoms do seem to be worse with motion and activity.  No other obvious aggravating or alleviating factors.  No recent injuries.  Pain does not  radiate.  No weakness or numbness.  No paresthesias.       Objective:  Physical Exam: BP 126/77   Pulse 67   Temp 98.4 F (36.9 C) (Temporal)   Ht 5\' 9"  (1.753 m)   Wt 158 lb 9.6 oz (71.9 kg)   SpO2 98%   BMI 23.42 kg/m   Gen: No acute distress, resting comfortably MUSCULOSKELETAL: - Back: Back without obvious abnormalities.  Tender to palpation along the lower thoracic and upper lumbar spinous processes.  Neurovascular intact distally. Neuro: Grossly normal, moves all extremities Psych: Normal affect and thought content      Ginnette Gates M. Jimmey Ralph, MD 05/31/2023 10:18 AM

## 2023-05-31 NOTE — Patient Instructions (Signed)
It was very nice to see you today!  Please start the meloxicam and tramadol.  I will urgently refer you to see a back doctor.  Please let us know if you have any change in symptoms or if your pain is not controlled with these medications.  Return if symptoms worsen or fail to improve.   Take care, Dr Jimmey Ralph  PLEASE NOTE:  If you had any lab tests, please let us know if you have not heard back within a few days. You may see your results on mychart before we have a chance to review them but we will give you a call once they are reviewed by [place].   If we ordered any referrals today, please let us know if you have not heard from their office within the next week.   If you had any urgent prescriptions sent in today, please check with the pharmacy within an hour of our visit to make sure the prescription was transmitted appropriately.   Please try these tips to maintain a healthy lifestyle:  Eat at least 3 REAL meals and 1-2 snacks per day.  Aim for no more than 5 hours between eating.  If you eat breakfast, please do so within one hour of getting up.   Each meal should contain half fruits/vegetables, one quarter protein, and one quarter carbs (no bigger than a computer mouse)  Cut down on sweet beverages. This includes juice, soda, and sweet tea.   Drink at least 1 glass of water with each meal and aim for at least 8 glasses per day  Exercise at least 150 minutes every week.

## 2023-06-07 DIAGNOSIS — M545 Low back pain, unspecified: Secondary | ICD-10-CM | POA: Diagnosis not present

## 2023-06-07 DIAGNOSIS — M546 Pain in thoracic spine: Secondary | ICD-10-CM | POA: Diagnosis not present

## 2023-06-11 DIAGNOSIS — M546 Pain in thoracic spine: Secondary | ICD-10-CM | POA: Diagnosis not present

## 2023-06-23 DIAGNOSIS — M545 Low back pain, unspecified: Secondary | ICD-10-CM | POA: Diagnosis not present

## 2023-06-23 DIAGNOSIS — M4854XD Collapsed vertebra, not elsewhere classified, thoracic region, subsequent encounter for fracture with routine healing: Secondary | ICD-10-CM | POA: Diagnosis not present

## 2023-06-27 ENCOUNTER — Other Ambulatory Visit: Payer: Self-pay | Admitting: Family Medicine

## 2023-07-06 ENCOUNTER — Encounter: Payer: Self-pay | Admitting: Dermatology

## 2023-07-06 ENCOUNTER — Ambulatory Visit: Payer: Medicare Other | Admitting: Dermatology

## 2023-07-06 VITALS — BP 133/86

## 2023-07-06 DIAGNOSIS — D492 Neoplasm of unspecified behavior of bone, soft tissue, and skin: Secondary | ICD-10-CM

## 2023-07-06 DIAGNOSIS — C44719 Basal cell carcinoma of skin of left lower limb, including hip: Secondary | ICD-10-CM

## 2023-07-06 DIAGNOSIS — D485 Neoplasm of uncertain behavior of skin: Secondary | ICD-10-CM

## 2023-07-06 DIAGNOSIS — R221 Localized swelling, mass and lump, neck: Secondary | ICD-10-CM

## 2023-07-06 DIAGNOSIS — Z85818 Personal history of malignant neoplasm of other sites of lip, oral cavity, and pharynx: Secondary | ICD-10-CM | POA: Diagnosis not present

## 2023-07-06 MED ORDER — MUPIROCIN 2 % EX OINT: 1.0000 | TOPICAL_OINTMENT | Freq: Every day | CUTANEOUS | 0 refills | Status: DC

## 2023-07-06 NOTE — Progress Notes (Signed)
Follow-Up Visit   Subjective  Jeffrey [name] is a 83 y.o. male who presents for the following: Spot of left lower leg that popped up a couple of months ago. It is not painful.  Patient with a history of oropharyngeal squamous cell carcinoma presents for follow-up of a nodule on the right side of the neck. A CT scan in June showed thickening of the skin, a fluid collection, and a small nodule. The patient reports the nodule remains unchanged since last seen in July. They consulted their PCP, who believed it to be thick skin and suggested seeing a plastic surgeon for removal if desired.  Patient also presents with a new spot on the leg that has been present for more than three months. It was initially squishy, then popped but never fully healed. The patient reports taking aspirin or Aleve within the last week.  Patient has a history of vertebral fractures from falling out of a tree years ago, with one vertebra collapsing.    The following portions of the chart were reviewed this encounter and updated as appropriate: medications, allergies, medical history  Review of Systems:  No other skin or systemic complaints except as noted in HPI or Assessment and Plan.  Objective  Well appearing patient in no apparent distress; mood and affect are within normal limits.   A focused examination was performed of the following areas:   Relevant exam findings are noted in the Assessment and Plan.  Left Lower Leg - Anterior 2.5 cm pink crusted papule   Assessment & Plan   Right-sided Neck Nodule Assessment: Patient with a history of oropharyngeal squamous cell carcinoma presents for follow-up of a right-sided neck nodule. Previous CT scan in June showed skin thickening, fluid collection, and a small nodule. Current physical exam reveals a palpable deep nodule, likely scar tissue, with no visible skin changes. The patient reports the nodule remains unchanged since the last visit in July.  Plan: We  will Message oncologist (Dr. Reuel Boom) regarding today's visit and current assessment.   The oncologist to determine the need for further imaging or biopsy.   The patient to follow up with the oncologist in March as scheduled.  Leg Lesion Assessment: 2.5 cm pink crusted papule on the leg present for over 3 months. Differential diagnosis includes squamous cell carcinoma (SCC).  Plan: Performed shave biopsy with local anesthesia and cauterization. Prescribed mupirocin ointment for wound care. Instruct patient to apply mupirocin once daily with a Band-Aid. Follow up via phone call in 1 week for biopsy results. If biopsy confirms skin cancer, refer to Dr. Caralyn Guile for Mohs surgery.  NEOPLASM OF UNCERTAIN BEHAVIOR OF SKIN Left Lower Leg - Anterior Skin / nail biopsy Type of biopsy: tangential   Informed consent: discussed and consent obtained   Timeout: patient name, date of birth, surgical site, and procedure verified   Procedure prep:  Patient was prepped and draped in usual sterile fashion Prep type:  Isopropyl alcohol Anesthesia: the lesion was anesthetized in a standard fashion   Anesthetic:  1% lidocaine w/ epinephrine 1-100,000 buffered w/ 8.4% NaHCO3 Instrument used: flexible razor blade   Hemostasis achieved with: pressure, aluminum chloride and electrodesiccation   Outcome: patient tolerated procedure well   Post-procedure details: sterile dressing applied and wound care instructions given   Dressing type: bandage and petrolatum   Specimen 1 - Surgical pathology Differential Diagnosis: R/O SCC  Check Margins: No Mupirocin oint daily with dressing changes.  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 He is seeing Dr Rachel Moulds in March 2025.    Return in about 6 months (around 01/03/2024).  I, Joanie Coddington, CMA, am acting as scribe for [employer],  DO .   Documentation: I have reviewed the above documentation for accuracy and completeness, and I agree with the above.  Langston Reusing, DO

## 2023-07-06 NOTE — Patient Instructions (Addendum)
Hello Jeffrey [name],  Thank you for visiting my office today. Below is a summary of the key points from our consultation today:  Subcutaneous Nodules on Right Neck: Since the overlying skin is completely normal and there is only small bumps felts under the skin, there isn't anything for me to biopsy in this area today.  This may be scar tissue, lymph nodes or a tumor.  A fine needle biopsy would be required if the area was to be sampled.  I will reach out to with your oncologist, Dr. Al Pimple  and discuss my findings and how they correlate with the CT scan you had in June 2024 whocih noted fluid collection and a mass in that area.    Biopsy of Leg Lesion: Given the persistence of the spot on your leg for more than three months, a biopsy was conducted today to exclude the possibility of skin cancer. The area was numbed to minimize discomfort, and the base was cauterized post-biopsy for better healing.  Post-Biopsy Care:   Application: Apply mupirocin ointment once daily and cover with a Band-Aid to prevent infection and promote healing.  Medication: Mupirocin ointment has been prescribed and is available for pickup at [facility] on [address].  Follow-Up: Our team will reach out to you with the biopsy results in approximately one week. Depending on the findings, a referral to Dr. Caralyn Guile for Mohs surgery may be considered.  Next Appointment: A follow-up appointment is scheduled for six months from now, though an earlier visit may be necessary based on the biopsy results.  Should you have any questions or concerns before your next visit, please do not hesitate to contact our office.  Warm regards,  Dr. Langston Reusing [facility]   Patient Handout: Wound Care for Skin Biopsy Site  Taking Care of Your Skin Biopsy Site  Proper care of the biopsy site is essential for promoting healing and minimizing scarring. This handout provides instructions on how to care for your biopsy site to ensure optimal  recovery.  1. Cleaning the Wound:  Clean the biopsy site daily with gentle soap and water. Gently pat the area dry with a clean, soft towel. Avoid harsh scrubbing or rubbing the area, as this can irritate the skin and delay healing.  2. Applying Aquaphor and Bandage:  After cleaning the wound, apply a thin layer of Aquaphor ointment to the biopsy site. Cover the area with a sterile bandage to protect it from dirt, bacteria, and friction. Change the bandage daily or as needed if it becomes soiled or wet.  3. Continued Care for One Week:  Repeat the cleaning, Aquaphor application, and bandaging process daily for one week following the biopsy procedure. Keeping the wound clean and moist during this initial healing period will help prevent infection and promote optimal healing.  4. Massaging Aquaphor into the Area:  ---After one week, discontinue the use of bandages but continue to apply Aquaphor to the biopsy site. ----Gently massage the Aquaphor into the area using circular motions. ---Massaging the skin helps to promote circulation and prevent the formation of scar tissue.   Additional Tips:  Avoid exposing the biopsy site to direct sunlight during the healing process, as this can cause hyperpigmentation or worsen scarring. If you experience any signs of infection, such as increased redness, swelling, warmth, or drainage from the wound, contact your healthcare provider immediately. Follow any additional instructions provided by your healthcare provider for caring for the biopsy site and managing any discomfort. Conclusion:  Taking proper care  of your skin biopsy site is crucial for ensuring optimal healing and minimizing scarring. By following these instructions for cleaning, applying Aquaphor, and massaging the area, you can promote a smooth and successful recovery. If you have any questions or concerns about caring for your biopsy site, don't hesitate to contact your healthcare  provider for guidance.     Important Information  Due to recent changes in healthcare laws, you may see results of your pathology and/or laboratory studies on MyChart before the doctors have had a chance to review them. We understand that in some cases there may be results that are confusing or concerning to you. Please understand that not all results are received at the same time and often the doctors may need to interpret multiple results in order to provide you with the best plan of care or course of treatment. Therefore, we ask that you please give [place] 2 business days to thoroughly review all your results before contacting the office for clarification. Should we see a critical lab result, you will be contacted sooner.   If You Need Anything After Your Visit  If you have any questions or concerns for your doctor, please call our main line at [phone] If no one answers, please leave a voicemail as directed and we will return your call as soon as possible. Messages left after 4 pm will be answered the following business day.   You may also send [place] a message via MyChart. We typically respond to MyChart messages within 1-2 business days.  For prescription refills, please ask your pharmacy to contact our office. Our fax number is [phone].  If you have an urgent issue when the clinic is closed that cannot wait until the next business day, you can page your doctor at the number below.    Please note that while we do our best to be available for urgent issues outside of office hours, we are not available 24/7.   If you have an urgent issue and are unable to reach [place], you may choose to seek medical care at your doctor's office, retail clinic, urgent care center, or emergency room.  If you have a medical emergency, please immediately call 911 or go to the emergency department. In the event of inclement weather, please call our main line at [phone] for an update on the status of any  delays or closures.  Dermatology Medication Tips: Please keep the boxes that topical medications come in in order to help keep track of the instructions about where and how to use these. Pharmacies typically print the medication instructions only on the boxes and not directly on the medication tubes.   If your medication is too expensive, please contact our office at [phone] or send [place] a message through MyChart.   We are unable to tell what your co-pay for medications will be in advance as this is different depending on your insurance coverage. However, we may be able to find a substitute medication at lower cost or fill out paperwork to get insurance to cover a needed medication.   If a prior authorization is required to get your medication covered by your insurance company, please allow [place] 1-2 business days to complete this process.  Drug prices often vary depending on where the prescription is filled and some pharmacies may offer cheaper prices.  The website www.goodrx.com contains coupons for medications through different pharmacies. The prices here do not account for what the cost may be with help from insurance (it  may be cheaper with your insurance), but the website can give you the price if you did not use any insurance.  - You can print the associated coupon and take it with your prescription to the pharmacy.  - You may also stop by our office during regular business hours and pick up a GoodRx coupon card.  - If you need your prescription sent electronically to a different pharmacy, notify our office through [facility] or by phone at [phone]

## 2023-07-07 LAB — SURGICAL PATHOLOGY

## 2023-07-12 ENCOUNTER — Encounter: Payer: Self-pay | Admitting: Dermatology

## 2023-07-12 ENCOUNTER — Telehealth: Payer: Self-pay

## 2023-07-12 NOTE — Telephone Encounter (Signed)
-----   Message from Langston Reusing sent at 07/12/2023  9:23 AM EST ----- Lujean Rave,  Please call patient and review results and refer for Mohs with Dr Caralyn Guile for positive skin cancer(s)  Thanks!

## 2023-07-12 NOTE — Progress Notes (Signed)
Hi Clydie Braun,  Please call patient and review results and refer for Mohs with Dr Caralyn Guile for positive skin cancer(s)  Thanks!

## 2023-07-12 NOTE — Telephone Encounter (Signed)
 Spoke with pt gave him bx results and treatment recommendations

## 2023-07-13 ENCOUNTER — Ambulatory Visit: Payer: Medicare Other

## 2023-07-13 VITALS — Wt 158.0 lb

## 2023-07-13 DIAGNOSIS — Z0000 Encounter for general adult medical examination without abnormal findings: Secondary | ICD-10-CM | POA: Diagnosis not present

## 2023-07-13 NOTE — Patient Instructions (Signed)
Mr. Jeffrey [name] , Thank you for taking time to come for your Medicare Wellness Visit. I appreciate your ongoing commitment to your health goals. Please review the following plan we discussed and let me know if I can assist you in the future.   Referrals/Orders/Follow-Ups/Clinician Recommendations: Each day, aim for 6 glasses of water, plenty of protein in your diet and try to get up and walk/ stretch every hour for 5-10 minutes at a time.    This is a list of the screening recommended for you and due dates:  Health Maintenance  Topic Date Due   COVID-19 Vaccine (5 - 2024-25 season) 02/19/2023   Medicare Annual Wellness Visit  04/26/2023   DTaP/Tdap/Td vaccine (2 - Td or Tdap) 05/05/2028   Pneumonia Vaccine  Completed   Flu Shot  Completed   Zoster (Shingles) Vaccine  Completed   HPV Vaccine  Aged Out    Advanced directives: (Copy Requested) Please bring a copy of your health care power of attorney and living will to the office to be added to your chart at your convenience.  Next Medicare Annual Wellness Visit scheduled for next year: Yes

## 2023-07-13 NOTE — Progress Notes (Signed)
Subjective:   Jeffrey [name] is a 83 y.o. male who presents for Medicare Annual/Subsequent preventive examination.  Visit Complete: Virtual I connected with  Jeffrey [name] on 07/13/23 by a audio enabled telemedicine application and verified that I am speaking with the correct person using two identifiers.  Patient Location: Home  Provider Location: Office/Clinic  I discussed the limitations of evaluation and management by telemedicine. The patient expressed understanding and agreed to proceed.  Vital Signs: Because this visit was a virtual/telehealth visit, some criteria may be missing or patient reported. Any vitals not documented were not able to be obtained and vitals that have been documented are patient reported.  Patient Medicare AWV questionnaire was completed by the patient on 07/09/23; I have confirmed that all information answered by patient is correct and no changes since this date.  Cardiac Risk Factors include: advanced age (>4men, >70 women);dyslipidemia;hypertension;male gender     Objective:    Today's Vitals   07/13/23 1130  Weight: 158 lb (71.7 kg)   Body mass index is 23.33 kg/m.     07/13/2023   11:34 AM 04/25/2022   11:38 AM 01/07/2022   11:22 AM 05/03/2021   11:14 AM 04/12/2021   11:15 AM 01/11/2021    2:44 PM 12/03/2020   10:31 AM  Advanced Directives  Does Patient Have a Medical Advance Directive? Yes No No No Yes No No  Type of Estate agent of [place];Living will        Does patient want to make changes to medical advance directive?     Yes (MAU/Ambulatory/Procedural Areas - Information given)    Copy of Healthcare Power of Attorney in Chart? No - copy requested        Would patient like information on creating a medical advance directive?  No - Patient declined No - Patient declined No - Patient declined  No - Patient declined No - Patient declined    Current Medications (verified) Outpatient Encounter Medications as of  07/13/2023  Medication Sig   amLODipine (NORVASC) 5 MG tablet TAKE 1 TABLET(5 MG) BY MOUTH DAILY   ARTIFICIAL TEAR SOLUTION OP Place 1 drop into both eyes daily as needed (dry eyes).   aspirin EC 81 MG tablet Take 1 tablet (81 mg total) by mouth daily. Swallow whole.   atorvastatin (LIPITOR) 40 MG tablet TAKE 1 TABLET(40 MG) BY MOUTH DAILY AT 6 PM   cyanocobalamin (VITAMIN B12) 1000 MCG tablet Take 1,000 mcg by mouth daily.   losartan (COZAAR) 25 MG tablet TAKE 1 TABLET(25 MG) BY MOUTH TWICE DAILY   meloxicam (MOBIC) 15 MG tablet TAKE 1 TABLET(15 MG) BY MOUTH DAILY   mupirocin ointment (BACTROBAN) 2 % Apply 1 Application topically daily. With dressing changes   [DISCONTINUED] Emollient (CERAVE) LOTN Apply 1 application topically daily as needed (after shower care).    No facility-administered encounter medications on file as of 07/13/2023.    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   Right Buccal Cheek(MOHS)   Osteopenia    Past Surgical History:  Procedure Laterality Date   ABDOMINAL AORTIC ENDOVASCULAR  FENESTRATED STENT GRAFT N/A 03/21/2018   Procedure: ABDOMINAL AORTIC ENDOVASCULAR FENESTRATED STENT GRAFT ; DYNA CT PERFORMED;  Surgeon: Nada Libman, MD;  Location: [facility] OR;  Service: Vascular;  Laterality: N/A;   Basal Skin Cancer  2012   CATARACT EXTRACTION Bilateral    IR GASTROSTOMY TUBE MOD SED  11/06/2020   IR GASTROSTOMY TUBE REMOVAL  04/19/2021   IR IMAGING GUIDED PORT INSERTION  11/06/2020   IR REMOVAL TUN ACCESS W/ PORT W/O FL MOD SED  07/15/2021   MASS BIOPSY Right 09/25/2020   Procedure: OPEN NECK MASS  BIOPSY;  Surgeon: Newman Pies, MD;  Location: [facility];  Service: ENT;  Laterality: Right;   MASS EXCISION     x 2; throat and above lip   TONSILLECTOMY     TRANSURETHRAL RESECTION OF PROSTATE     Family History  Problem Relation Age of Onset   Diabetes Mother    Hypertension Mother    Stroke Mother    Alcohol abuse Father    Heart attack Father    Heart disease Father    Hypertension Father    Hyperlipidemia Sister    Pancreatic cancer Sister    Pancreatic cancer Brother    Colon cancer Paternal Grandmother 22   Hypertension Son    Esophageal cancer Neg Hx    Stomach cancer Neg Hx    Liver disease Neg Hx    Social History   Socioeconomic History   Marital status: Divorced    Spouse name: Not on file   Number of children: 1   Years of education: Not on file   Highest education level: Associate degree: academic program  Occupational History   Occupation: Retired    Associate Professor: [employer]  Tobacco Use   Smoking status: Former    Current packs/day: 0.00    Average packs/day: 1 pack/day for 32.0 years (32.0 ttl pk-yrs)    Types: Cigarettes    Start date: 08/17/1958    Quit date: 08/17/1990    Years since quitting: 32.9   Smokeless tobacco: Never  Vaping Use   Vaping status: Never Used  Substance and Sexual Activity   Alcohol use: Not Currently    Comment: Occassionally, "when I feel like it"   Drug use: No   Sexual activity: Yes    Partners: Female  Other Topics Concern   Not on file  Social History Narrative   Not on file   Social Drivers of Health   Financial Resource Strain: Low Risk  (07/13/2023)   Overall Financial Resource Strain (CARDIA)    Difficulty of Paying Living Expenses: Not hard at all  Food Insecurity: No Food Insecurity (07/13/2023)   Hunger Vital Sign    Worried About Running Out of Food in the Last Year: Never true    Ran Out of Food in the Last Year: Never true  Transportation Needs: No Transportation Needs (07/13/2023)    PRAPARE - Administrator, Civil Service (Medical): No    Lack of Transportation (Non-Medical): No  Physical Activity: Inactive (07/13/2023)   Exercise Vital Sign    Days of Exercise per Week: 0 days    Minutes of Exercise per Session: 0 min  Stress: No Stress Concern Present (07/13/2023)   [employer] of Occupational Health - Occupational Stress Questionnaire    Feeling of Stress : Not at all  Social Connections: Moderately Integrated (07/13/2023)   Social Connection and Isolation Panel [NHANES]    Frequency of  Communication with Friends and Family: Once a week    Frequency of Social Gatherings with Friends and Family: More than three times a week    Attends Religious Services: More than 4 times per year    Active Member of [employer] or Organizations: Yes    Attends Banker Meetings: 1 to 4 times per year    Marital Status: Divorced  Recent Concern: Social Connections - Socially Isolated (05/30/2023)   Social Connection and Isolation Panel [NHANES]    Frequency of Communication with Friends and Family: Once a week    Frequency of Social Gatherings with Friends and Family: Once a week    Attends Religious Services: Never    Database administrator or Organizations: No    Attends Engineer, structural: Not on file    Marital Status: Divorced    Tobacco Counseling Counseling given: Not Answered   Clinical Intake:  Pre-visit preparation completed: Yes  Pain : No/denies pain     BMI - recorded: 23.33 Nutritional Status: BMI of 19-24  Normal Nutritional Risks: None Diabetes: No  How often do you need to have someone help you when you read instructions, pamphlets, or other written materials from your doctor or pharmacy?: 1 - Never  Interpreter Needed?: No  Information entered by :: Lanier Ensign, LPN   Activities of Daily Living    07/09/2023    8:45 AM  In your present state of health, do you have any difficulty performing the following  activities:  Hearing? 0  Vision? 0  Difficulty concentrating or making decisions? 0  Walking or climbing stairs? 1  Comment due to back issues  Dressing or bathing? 0  Doing errands, shopping? 0  Preparing Food and eating ? N  Using the Toilet? N  In the past six months, have you accidently leaked urine? N  Do you have problems with loss of bowel control? N  Managing your Medications? N  Managing your Finances? N  Housekeeping or managing your Housekeeping? N    Patient Care Team: Ardith Dark, MD as PCP - General (Family Medicine) Jodelle Red, MD as PCP - Cardiology (Cardiology) Robbie Lis (Inactive) as Consulting Physician (Dentistry) Nada Libman, MD as Consulting Physician (Vascular Surgery) Glyn Ade, PA-C as Physician Assistant (Dermatology) Malmfelt, Lise Auer, RN as Oncology Nurse Navigator Lonie Peak, MD as Consulting Physician (Radiation Oncology) Newman Pies, MD as Consulting Physician (Otolaryngology) Rachel Moulds, MD as Consulting Physician (Hematology and Oncology) Erroll Luna, [facility] (Inactive) as Pharmacist (Pharmacist) Janalyn Harder, MD (Inactive) as Consulting Physician (Dermatology)  Indicate any recent Medical Services you may have received from other than [facility] providers in the past year (date may be approximate).     Assessment:   This is a routine wellness examination for [facility].  Hearing/Vision screen Hearing Screening - Comments:: Pt denies any hearing issues  Vision Screening - Comments:: Pt follows up with Dr Shea Evans for annul eye exams    Goals Addressed             This Visit's Progress    Patient Stated       Maintain health and activity        Depression Screen    07/13/2023   11:35 AM 05/31/2023    9:49 AM 04/17/2023    1:01 PM 01/17/2023    1:44 PM 06/22/2022   12:02 PM 04/25/2022   11:36 AM 04/14/2022    1:04 PM  PHQ 2/9 Scores  PHQ -  2 Score 0 0 0 0 0 0 0    Fall Risk    07/09/2023     8:45 AM 05/31/2023    9:50 AM 04/17/2023    1:01 PM 01/17/2023    1:44 PM 04/25/2022   11:39 AM  Fall Risk   Falls in the past year? 0 0 0 0 0  Number falls in past yr: 0 0 0 0 0  Injury with Fall? 0 0 0 0 0  Risk for fall due to : Impaired mobility No Fall Risks No Fall Risks No Fall Risks Impaired vision  Follow up Falls prevention discussed    Falls prevention discussed    MEDICARE RISK AT HOME: Medicare Risk at Home Any stairs in or around the home?: (Patient-Rptd) Yes If so, are there any without handrails?: (Patient-Rptd) No Home free of loose throw rugs in walkways, pet beds, electrical cords, etc?: (Patient-Rptd) Yes Adequate lighting in your home to reduce risk of falls?: (Patient-Rptd) Yes Life alert?: (Patient-Rptd) No Use of a cane, walker or w/c?: (Patient-Rptd) Yes Grab bars in the bathroom?: (Patient-Rptd) Yes Shower chair or bench in shower?: (Patient-Rptd) Yes Elevated toilet seat or a handicapped toilet?: (Patient-Rptd) No  TIMED UP AND GO:  Was the test performed?  No    Cognitive Function:    02/27/2017   11:25 AM  MMSE - Mini Mental State Exam  Orientation to time 5  Orientation to Place 5  Registration 3  Attention/ Calculation 3  Recall 2  Language- name 2 objects 2  Language- repeat 1  Language- follow 3 step command 3  Language- read & follow direction 1  Write a sentence 1  Copy design 1  Total score 27        07/13/2023   11:37 AM 04/25/2022   11:40 AM 04/12/2021   11:20 AM 04/06/2020    2:13 PM 03/05/2019    9:45 AM  6CIT Screen  What Year? 0 points 0 points 0 points 0 points 0 points  What month? 0 points 0 points 0 points 0 points 0 points  What time? 0 points 0 points 0 points  0 points  Count back from 20 0 points 0 points 0 points 0 points 0 points  Months in reverse 0 points 0 points 0 points 0 points 0 points  Repeat phrase 0 points 0 points 0 points 0 points 2 points  Total Score 0 points 0 points 0 points  2 points     Immunizations Immunization History  Administered Date(s) Administered   Fluad Quad(high Dose 65+) 03/26/2019, 04/09/2020, 04/13/2021, 04/14/2022   Fluad Trivalent(High Dose 65+) 04/17/2023   Influenza, High Dose Seasonal PF 04/26/2017, 05/05/2018   PFIZER Comirnaty(Gray Top)Covid-19 Tri-Sucrose Vaccine 10/26/2020   PFIZER(Purple Top)SARS-COV-2 Vaccination 07/01/2019, 07/22/2019, 04/24/2020   Pneumococcal Conjugate-13 09/24/2014   Pneumococcal Polysaccharide-23 04/26/2017   Tdap 05/05/2018   Zoster Recombinant(Shingrix) 03/09/2019, 05/14/2019    TDAP status: Up to date  Flu Vaccine status: Up to date  Pneumococcal vaccine status: Up to date  Covid-19 vaccine status: Information provided on how to obtain vaccines.   Qualifies for Shingles Vaccine? Yes   Zostavax completed Yes   Shingrix Completed?: Yes  Screening Tests Health Maintenance  Topic Date Due   COVID-19 Vaccine (5 - 2024-25 season) 02/19/2023   Medicare Annual Wellness (AWV)  07/12/2024   DTaP/Tdap/Td (2 - Td or Tdap) 05/05/2028   Pneumonia Vaccine 12+ Years old  Completed   INFLUENZA VACCINE  Completed  Zoster Vaccines- Shingrix  Completed   HPV VACCINES  Aged Out    Health Maintenance  Health Maintenance Due  Topic Date Due   COVID-19 Vaccine (5 - 2024-25 season) 02/19/2023    Colorectal cancer screening: No longer required.    Additional Screening:  Vision Screening: Recommended annual ophthalmology exams for early detection of glaucoma and other disorders of the eye. Is the patient up to date with their annual eye exam?  Yes  Who is the provider or what is the name of the office in which the patient attends annual eye exams? Dr Shea Evans  If pt is not established with a provider, would they like to be referred to a provider to establish care? No .   Dental Screening: Recommended annual dental exams for proper oral hygiene   Community Resource Referral / Chronic Care Management: CRR required  this visit?  No   CCM required this visit?  No     Plan:     I have personally reviewed and noted the following in the patient's chart:   Medical and social history Use of alcohol, tobacco or illicit drugs  Current medications and supplements including opioid prescriptions. Patient is not currently taking opioid prescriptions. Functional ability and status Nutritional status Physical activity Advanced directives List of other physicians Hospitalizations, surgeries, and ER visits in previous 12 months Vitals Screenings to include cognitive, depression, and falls Referrals and appointments  In addition, I have reviewed and discussed with patient certain preventive protocols, quality metrics, and best practice recommendations. A written personalized care plan for preventive services as well as general preventive health recommendations were provided to patient.     Marzella Schlein, LPN   [id number]/82/[id number]   After Visit Summary: (MyChart) Due to this being a telephonic visit, the after visit summary with patients personalized plan was offered to patient via MyChart   Nurse Notes: none

## 2023-07-26 ENCOUNTER — Other Ambulatory Visit: Payer: Self-pay | Admitting: Family Medicine

## 2023-07-27 ENCOUNTER — Telehealth (INDEPENDENT_AMBULATORY_CARE_PROVIDER_SITE_OTHER): Payer: Self-pay | Admitting: Otolaryngology

## 2023-07-27 NOTE — Telephone Encounter (Signed)
 Reminder Call: Date: 07/28/2023 Status: Sch  Time: 11:50 AM [address] [place], [place] [address]  Confirmed time and location w/patient.

## 2023-07-28 ENCOUNTER — Encounter (INDEPENDENT_AMBULATORY_CARE_PROVIDER_SITE_OTHER): Payer: Self-pay

## 2023-07-28 ENCOUNTER — Ambulatory Visit (INDEPENDENT_AMBULATORY_CARE_PROVIDER_SITE_OTHER): Payer: Medicare Other

## 2023-07-28 VITALS — HR 70 | Ht 69.0 in | Wt 158.0 lb

## 2023-07-28 DIAGNOSIS — H6123 Impacted cerumen, bilateral: Secondary | ICD-10-CM

## 2023-07-28 DIAGNOSIS — Z85819 Personal history of malignant neoplasm of unspecified site of lip, oral cavity, and pharynx: Secondary | ICD-10-CM

## 2023-07-28 DIAGNOSIS — Z08 Encounter for follow-up examination after completed treatment for malignant neoplasm: Secondary | ICD-10-CM | POA: Diagnosis not present

## 2023-07-30 DIAGNOSIS — H6123 Impacted cerumen, bilateral: Secondary | ICD-10-CM | POA: Insufficient documentation

## 2023-07-30 DIAGNOSIS — Z85819 Personal history of malignant neoplasm of unspecified site of lip, oral cavity, and pharynx: Secondary | ICD-10-CM | POA: Insufficient documentation

## 2023-07-30 NOTE — Progress Notes (Signed)
 Patient ID: Jeffrey [name], male   DOB: November 20, 1940, 83 y.o.   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  The patient has a history of tongue base squamous cell carcinoma, with right neck metastasis.  He completed his chemoradiation treatment in July 2022.  According to the patient, he is currently doing well.  He is tolerating oral intake well.  He denies any dysphagia, odynophagia, or dyspnea.  He has not noted any new mass or lesion in his head and neck region.  Exam: General: Communicates without difficulty, well nourished, no acute distress. Head: Normocephalic, no evidence injury, no tenderness, facial buttresses intact without stepoff. Face/sinus: No tenderness to palpation and percussion. Facial movement is normal and symmetric. Eyes: PERRL, EOMI. No scleral icterus, conjunctivae clear. Neuro: CN II exam reveals vision grossly intact.  No nystagmus at any point of gaze. Ears: Auricles well formed without lesions.  Bilateral cerumen impaction.  Nose: External evaluation reveals normal support and skin without lesions.  Dorsum is intact.  Anterior rhinoscopy reveals congested mucosa over anterior aspect of inferior turbinates and intact septum.  No purulence noted. Oral:  Oral cavity and oropharynx are intact, symmetric, without erythema or edema.  Mucosa is moist without lesions.  Indirect laryngoscopy shows no suspicious lesion.  Neck: Full range of motion without pain.  There is no significant lymphadenopathy.  No masses palpable.  His neck incision is well-healed.  Trachea is midline. Neuro:  CN 2-12 grossly intact.   Procedure: Bilateral cerumen disimpaction Anesthesia: None Description: Under the operating microscope, the cerumen is carefully removed with a combination of cerumen currette, alligator forceps, and suction catheters.  After the cerumen is removed,  the TMs are noted to be normal.  No mass, erythema, or lesions. The patient tolerated the procedure well.    Assessment: 1.  Incidental finding of bilateral cerumen impaction.  After the cerumen disimpaction procedure, both tympanic membranes and middle ear spaces are noted to be normal. 2.  The patient is doing well status post chemoradiation treatment of his tongue base squamous cell carcinoma.  His right neck incision is well-healed. 3.  There is no evidence of residual or recurrent disease on his indirect laryngoscopy examination.  Plan: 1.  The physical exam findings are reviewed with the patient. 2.  Otomicroscopy with bilateral cerumen disimpaction. 3.  The patient will return for reevaluation in 6 months.

## 2023-07-31 ENCOUNTER — Other Ambulatory Visit: Payer: Self-pay

## 2023-07-31 DIAGNOSIS — I7143 Infrarenal abdominal aortic aneurysm, without rupture: Secondary | ICD-10-CM

## 2023-08-12 ENCOUNTER — Other Ambulatory Visit: Payer: Self-pay | Admitting: Cardiology

## 2023-08-12 DIAGNOSIS — I10 Essential (primary) hypertension: Secondary | ICD-10-CM

## 2023-08-14 ENCOUNTER — Other Ambulatory Visit: Payer: Self-pay | Admitting: Cardiology

## 2023-08-14 DIAGNOSIS — I10 Essential (primary) hypertension: Secondary | ICD-10-CM

## 2023-08-16 ENCOUNTER — Encounter: Payer: Self-pay | Admitting: Dermatology

## 2023-08-17 ENCOUNTER — Encounter: Payer: Self-pay | Admitting: Dermatology

## 2023-08-17 ENCOUNTER — Ambulatory Visit: Payer: Medicare Other | Admitting: Dermatology

## 2023-08-17 VITALS — BP 128/69 | HR 70 | Temp 98.2°F

## 2023-08-17 DIAGNOSIS — C44719 Basal cell carcinoma of skin of left lower limb, including hip: Secondary | ICD-10-CM | POA: Diagnosis not present

## 2023-08-17 DIAGNOSIS — C4491 Basal cell carcinoma of skin, unspecified: Secondary | ICD-10-CM

## 2023-08-17 DIAGNOSIS — L814 Other melanin hyperpigmentation: Secondary | ICD-10-CM

## 2023-08-17 DIAGNOSIS — L579 Skin changes due to chronic exposure to nonionizing radiation, unspecified: Secondary | ICD-10-CM | POA: Diagnosis not present

## 2023-08-17 NOTE — Progress Notes (Unsigned)
 Follow-Up Visit   Subjective  Jeffrey [name] is a 83 y.o. male who presents for the following: Mohs of a nodular Basal Cell Carcinoma on the left shin, biopsied by Dr. Onalee Hua.  The following portions of the chart were reviewed this encounter and updated as appropriate: medications, allergies, medical history  Review of Systems:  No other skin or systemic complaints except as noted in HPI or Assessment and Plan.  Objective  Well appearing patient in no apparent distress; mood and affect are within normal limits.  A focused examination was performed of the following areas: Left lower leg, anterior Relevant physical exam findings are noted in the Assessment and Plan.     Assessment & Plan   BASAL CELL CARCINOMA (BCC), UNSPECIFIED SITE Left Lower Leg - Anterior Mohs surgery  Consent obtained: written  Anticoagulation: Is the patient taking prescription anticoagulant and/or aspirin prescribed/recommended by a physician? No   Was the anticoagulation regimen changed prior to Mohs? No    Anesthesia: Anesthesia method: local infiltration Local anesthetic: lidocaine 1% WITH epi  Procedure Details: Timeout: pre-procedure verification complete Procedure Prep: patient was prepped and draped in usual sterile fashion Prep type: isopropyl alcohol Pre-Op diagnosis: basal cell carcinoma BCC subtype: nodular MohsAIQ Surgical site (if tumor spans multiple areas, please select predominant area): lower limb (including hip) Surgery side: left Surgical site (from skin exam): Left Lower Leg - Anterior Pre-operative length (cm): 1.5 Pre-operative width (cm): 1.3 Indications for Mohs surgery: anatomic location where tissue conservation is critical  Micrographic Surgery Details: Post-operative length (cm): 2.3 Post-operative width (cm): 1.9 Number of Mohs stages: 1   Return in about 4 weeks (around 09/14/2023) for wound check.   08/18/2023  HISTORY OF PRESENT ILLNESS  Jeffrey [name] is  seen in consultation at the request of Dr. Onalee Hua for biopsy-proven Nodular Basal Cell Carcinoma on the left lower leg. They note that the area has been present for about 2 months increasing in size with time.  There is no history of previous treatment.  Reports no other new or changing lesions and has no other complaints today.  Medications and allergies: see patient chart.  Review of systems: Reviewed 8 systems and notable for the above skin cancer.  All other systems reviewed are unremarkable/negative, unless noted in the HPI. Past medical history, surgical history, family history, social history were also reviewed and are noted in the chart/questionnaire.    PHYSICAL EXAMINATION  General: Well-appearing, in no acute distress, alert and oriented x 4. Vitals reviewed in chart (if available).   Skin: Exam reveals a 1.5 x 1.3 cm erythematous papule and biopsy scar on the left lower leg. There are rhytids, telangiectasias, and lentigines, consistent with photodamage.  Biopsy report(s) reviewed, confirming the diagnosis.   ASSESSMENT  1) Nodular Basal Cell Carcinoma on the left lower leg 2) photodamage 3) solar lentigines   PLAN   1. Due to location, size, histology, or recurrence and the likelihood of subclinical extension as well as the need to conserve normal surrounding tissue, the patient was deemed acceptable for Mohs micrographic surgery (MMS).  The nature and purpose of the procedure, associated benefits and risks including recurrence and scarring, possible complications such as pain, infection, and bleeding, and alternative methods of treatment if appropriate were discussed with the patient during consent. The lesion location was verified by the patient, by reviewing previous notes, pathology reports, and by photographs as well as angulation measurements if available.  Informed consent was reviewed and signed by the patient,  and timeout was performed at 8:30 AM. See op note below.  2.  For the photodamage and solar lentigines, sun protection discussed/information given on OTC sunscreens, and we recommend continued regular follow-up with primary dermatologist every 6 months or sooner for any growing, bleeding, or changing lesions. 3. Prognosis and future surveillance discussed. 4. Letter with treatment outcome sent to referring provider. 5. Pain acetaminophen/ibuprofen   MOHS MICROGRAPHIC SURGERY AND RECONSTRUCTION  Initial size:   1.5 x 1.3 cm Surgical defect/wound size: 2.3 x 1.9 cm Anesthesia:    0.33% lidocaine with 1:200,000 epinephrine EBL:    <5 mL Complications:  None Repair type:   Secondary Intention   Stages: 1  STAGE I: Anesthesia achieved with 0.5% lidocaine with 1:200,000 epinephrine. ChloraPrep applied. 1 section(s) excised using Mohs technique (this includes total peripheral and deep tissue margin excision and evaluation with frozen sections, excised and interpreted by the same physician). The tumor was first debulked and then excised with an approx. 2mm margin.  Hemostasis was achieved with electrocautery as needed.  The specimen was then oriented, subdivided/relaxed, inked, and processed using Mohs technique.    Frozen section analysis revealed a clear deep and peripheral margin.  Reconstruction  Patient was notified of results and repair options were discussed, including second intention healing. After reviewing the advantages and disadvantages of each, we agreed on second intention healing as appropriate.   The surgical site was then lightly scrubbed with sterile, saline-soaked gauze.  The area was bandaged using Vaseline ointment, non-adherent gauze, gauze pads, and tape to provide an adequate pressure dressing.   The patient tolerated the procedure well, was given detailed written and verbal wound care instructions, and was discharged in good condition.  The patient will follow-up in 4 weeks and as scheduled with primary  dermatologist.   Documentation: I have reviewed the above documentation for accuracy and completeness, and I agree with the above.  Gwenith Daily, MD

## 2023-08-17 NOTE — Patient Instructions (Signed)

## 2023-08-23 ENCOUNTER — Other Ambulatory Visit: Payer: Self-pay | Admitting: Dermatology

## 2023-08-23 ENCOUNTER — Other Ambulatory Visit: Payer: Self-pay | Admitting: Family Medicine

## 2023-08-24 ENCOUNTER — Ambulatory Visit
Admission: RE | Admit: 2023-08-24 | Discharge: 2023-08-24 | Disposition: A | Discharge status: Home / Self Care | Payer: Medicare Other | Source: Ambulatory Visit | Attending: Surgery | Admitting: Surgery

## 2023-08-24 DIAGNOSIS — I7143 Infrarenal abdominal aortic aneurysm, without rupture: Secondary | ICD-10-CM

## 2023-08-24 MED ORDER — IOPAMIDOL (ISOVUE-370) INJECTION 76%
75.0000 mL | Freq: Once | INTRAVENOUS | Status: AC | PRN
  Administered 2023-08-24: 75 mL via INTRAVENOUS

## 2023-08-28 ENCOUNTER — Ambulatory Visit: Payer: Medicare Other | Admitting: Surgery

## 2023-08-28 ENCOUNTER — Encounter: Payer: Self-pay | Admitting: Surgery

## 2023-08-28 VITALS — BP 145/99 | HR 67 | Temp 98.5°F | Ht 69.0 in | Wt 151.5 lb

## 2023-08-28 DIAGNOSIS — I7142 Juxtarenal abdominal aortic aneurysm, without rupture: Secondary | ICD-10-CM

## 2023-08-28 NOTE — Progress Notes (Signed)
 [facility]  Patient name: Jeffrey [name] MRN: [id number] DOB: [birth year] Sex: male   REASON FOR VISIT:    Follow up  HISOTRY OF PRESENT ILLNESS:   Jeffrey [name] is a 83 y.o. male who returns today for follow up  He is status post FEVAR with a scalloped to the superior mesenteric artery and bilateral renal stents on March 21, 2018.  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  He did have some left foot numbness following the procedure which improved with time.  He had a relatively silent occlusion of his left limb of his graft.  He rarely gets left leg symptoms.  On his most recent CT scan 6 months ago maximum size of his aorta was 5 cm, which was an increase.  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  He is back today for follow-up      He continues to take a statin for hypercholesterolemia.  He is on an ARB for hypertension.  He does take an aspirin.  He is a former smoker.  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   Right Buccal Cheek(MOHS)   Nodular basal cell carcinoma (BCC)    Left lower leg, anterior treated 08/17/23 w Mohs   Osteopenia      FAMILY HISTORY:   Family History  Problem Relation Age of Onset   Diabetes Mother    Hypertension Mother    Stroke Mother    Alcohol abuse Father    Heart attack Father    Heart disease Father    Hypertension Father    Hyperlipidemia Sister    Pancreatic cancer Sister    Pancreatic cancer Brother    Colon cancer Paternal Grandmother 49   Hypertension Son    Esophageal cancer Neg Hx    Stomach cancer  Neg Hx    Liver disease Neg Hx     SOCIAL HISTORY:   Social History   Tobacco Use   Smoking status: Former    Current packs/day: 0.00    Average packs/day: 1 pack/day for 32.0 years (32.0 ttl pk-yrs)    Types: Cigarettes    Start date: 08/17/1958    Quit date: 08/17/1990    Years since quitting: 33.0   Smokeless tobacco: Never  Substance Use Topics   Alcohol use: Not Currently    Comment: Occassionally, "when I feel like it"     ALLERGIES:   Allergies  Allergen Reactions   Ketotifen Fumarate Swelling and Other (See Comments)    Eye swelling   Mixed Ragweed     Hay fever   Triamcinolone Hives and Rash     CURRENT MEDICATIONS:   Current Outpatient Medications  Medication Sig Dispense Refill   amLODipine (NORVASC) 5 MG tablet TAKE 1 TABLET(5 MG) BY MOUTH DAILY 30 tablet 0   ARTIFICIAL TEAR SOLUTION OP Place 1 drop into both eyes daily as needed (dry eyes).     aspirin EC 81 MG tablet Take 1 tablet (81 mg total) by mouth daily. Swallow whole. 90 tablet  3   atorvastatin (LIPITOR) 40 MG tablet TAKE 1 TABLET(40 MG) BY MOUTH DAILY AT 6 PM 90 tablet 2   cyanocobalamin (VITAMIN B12) 1000 MCG tablet Take 1,000 mcg by mouth daily.     losartan (COZAAR) 25 MG tablet TAKE 1 TABLET(25 MG) BY MOUTH TWICE DAILY 180 tablet 3   meloxicam (MOBIC) 15 MG tablet TAKE 1 TABLET(15 MG) BY MOUTH DAILY 30 tablet 0   mupirocin ointment (BACTROBAN) 2 % APPLY TOPICALLY TO THE AFFECTED AREA DAILY WITH DRESSING CHANGES 30 g 2   No current facility-administered medications for this visit.    REVIEW OF SYSTEMS:   [X]  denotes positive finding, [ ]  denotes negative finding Cardiac  Comments:  Chest pain or chest pressure:    Shortness of breath upon exertion:    Short of breath when lying flat:    Irregular heart rhythm:        Vascular    Pain in calf, thigh, or hip brought on by ambulation:    Pain in feet at night that wakes you up from your sleep:     Blood clot in your veins:    Leg  swelling:         Pulmonary    Oxygen at home:    Productive cough:     Wheezing:         Neurologic    Sudden weakness in arms or legs:     Sudden numbness in arms or legs:     Sudden onset of difficulty speaking or slurred speech:    Temporary loss of vision in one eye:     Problems with dizziness:         Gastrointestinal    Blood in stool:     Vomited blood:         Genitourinary    Burning when urinating:     Blood in urine:        Psychiatric    Major depression:         Hematologic    Bleeding problems:    Problems with blood clotting too easily:        Skin    Rashes or ulcers:        Constitutional    Fever or chills:      PHYSICAL EXAM:   There were no vitals filed for this visit.  GENERAL: The patient is a well-nourished male, in no acute distress. The vital signs are documented above. CARDIAC: There is a regular rate and rhythm.  VASCULAR: Bilateral edema PULMONARY: Non-labored respirations ABDOMEN: Soft and non-tender  MUSCULOSKELETAL: There are no major deformities or cyanosis. NEUROLOGIC: No focal weakness or paresthesias are detected. SKIN: There are no ulcers or rashes noted. PSYCHIATRIC: The patient has a normal affect.  STUDIES:   I have reviewed the following CTA: VASCULAR   1. Abdominal aortic aneurysm status post fenestrated endograft repair demonstrates a persistent delayed type 2 endoleak. The excluded aneurysm sac remains stable in size at 5 cm in diameter. Recommend continued attention on follow-up imaging. 2. 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 3. Widely patent SMA fenestration and bilateral renal artery fenestrations with stents in place. 4. No new or acute vascular abnormality.   NON-VASCULAR   1. Ancillary findings as above without significant interval change.  MEDICAL ISSUES:   AAA: no significant change in aneurysm diameter, the maximum being 5 cm.  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  We discussed  what this  meant today.  We will continue to monitor this.  I will get an ultrasound in 1 year.  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    Charlena Cross, MD, FACS [facility] [phone] Pager [phone]

## 2023-09-13 ENCOUNTER — Other Ambulatory Visit: Payer: Self-pay | Admitting: Cardiology

## 2023-09-13 ENCOUNTER — Encounter: Payer: Self-pay | Admitting: Dermatology

## 2023-09-13 ENCOUNTER — Ambulatory Visit: Admitting: Dermatology

## 2023-09-13 VITALS — BP 137/69 | HR 69

## 2023-09-13 DIAGNOSIS — T1490XD Injury, unspecified, subsequent encounter: Secondary | ICD-10-CM

## 2023-09-13 DIAGNOSIS — L539 Erythematous condition, unspecified: Secondary | ICD-10-CM | POA: Diagnosis not present

## 2023-09-13 DIAGNOSIS — I10 Essential (primary) hypertension: Secondary | ICD-10-CM

## 2023-09-13 DIAGNOSIS — Z85828 Personal history of other malignant neoplasm of skin: Secondary | ICD-10-CM | POA: Diagnosis not present

## 2023-09-13 DIAGNOSIS — C4491 Basal cell carcinoma of skin, unspecified: Secondary | ICD-10-CM

## 2023-09-13 MED ORDER — MUPIROCIN 2 % EX OINT: 1.0000 | TOPICAL_OINTMENT | Freq: Two times a day (BID) | CUTANEOUS | 1 refills | Status: DC

## 2023-09-13 NOTE — Progress Notes (Signed)
   Follow Up Visit   Subjective  Jeffrey [name] is a 83 y.o. male who presents for the following: follow up from Mohs surgery   The patient presents for follow up from Mohs surgery for a BCC on the left shin, treated on 08/17/23, repaired with second intention. The patient has been bandaging the wound as directed. The endorse the following concerns: No questions or concerns at this time.  The following portions of the chart were reviewed this encounter and updated as appropriate: medications, allergies, medical history  Review of Systems:  No other skin or systemic complaints except as noted in HPI or Assessment and Plan.  Objective  Well appearing patient in no apparent distress; mood and affect are within normal limits.  A full examination was performed including scalp, head, face and left lower leg. All findings within normal limits unless otherwise noted below.  Healing wound with mild erythema   Relevant physical exam findings are noted in the Assessment and Plan.   Assessment & Plan   Healing s/p Mohs for [facility], treated on 08/17/23, repaired with second intention - Reassured that wound is healing well - No evidence of infection - No swelling, induration, purulence, dehiscence, or tenderness out of proportion to the clinical exam, see photo above - Discussed that scars take up to 12 months to mature from the date of surgery - Recommend SPF 30+ to scar daily to prevent purple color from UV exposure during scar maturation process - Discussed that erythema and raised appearance of scar will fade over the next 4-6 months - OK to start scar massage at 4-6 weeks post-op - Can consider silicone based products for scar healing starting at 6 weeks post-op - Ok to continue ointment daily to wound under a bandage for another 4 weeks  HISTORY OF BASAL CELL CARCINOMA OF THE SKIN - No evidence of recurrence today - Recommend regular full body skin exams - Recommend daily broad spectrum sunscreen  SPF 30+ to sun-exposed areas, reapply every 2 hours as needed.  - Call if any new or changing lesions are noted between office visits  Return in about 4 weeks (around 10/11/2023) for wound check.  I, Manual Meier, Surg Tech III, am acting as scribe for Gwenith Daily, MD.   Documentation: I have reviewed the above documentation for accuracy and completeness, and I agree with the above.  Gwenith Daily, MD

## 2023-09-13 NOTE — Patient Instructions (Signed)

## 2023-09-14 ENCOUNTER — Ambulatory Visit: Payer: Medicare Other | Admitting: Dermatology

## 2023-09-21 ENCOUNTER — Other Ambulatory Visit: Payer: Self-pay | Admitting: *Deleted

## 2023-09-21 MED ORDER — MELOXICAM 15 MG PO TABS: 15.0000 mg | ORAL_TABLET | Freq: Every day | ORAL | 0 refills | Status: DC

## 2023-10-11 ENCOUNTER — Other Ambulatory Visit: Payer: Self-pay | Admitting: Cardiology

## 2023-10-11 DIAGNOSIS — I10 Essential (primary) hypertension: Secondary | ICD-10-CM

## 2023-10-12 ENCOUNTER — Encounter: Payer: Self-pay | Admitting: Dermatology

## 2023-10-12 ENCOUNTER — Ambulatory Visit: Admitting: Dermatology

## 2023-10-12 ENCOUNTER — Other Ambulatory Visit: Payer: Self-pay | Admitting: Cardiology

## 2023-10-12 VITALS — BP 123/67 | HR 76

## 2023-10-12 DIAGNOSIS — T1490XD Injury, unspecified, subsequent encounter: Secondary | ICD-10-CM

## 2023-10-12 DIAGNOSIS — C4491 Basal cell carcinoma of skin, unspecified: Secondary | ICD-10-CM

## 2023-10-12 DIAGNOSIS — L929 Granulomatous disorder of the skin and subcutaneous tissue, unspecified: Secondary | ICD-10-CM

## 2023-10-12 DIAGNOSIS — L539 Erythematous condition, unspecified: Secondary | ICD-10-CM | POA: Diagnosis not present

## 2023-10-12 DIAGNOSIS — Z85828 Personal history of other malignant neoplasm of skin: Secondary | ICD-10-CM | POA: Diagnosis not present

## 2023-10-12 DIAGNOSIS — I10 Essential (primary) hypertension: Secondary | ICD-10-CM

## 2023-10-12 NOTE — Progress Notes (Addendum)
   Follow Up Visit   Subjective  Jeffrey [name] is a 83 y.o. male who presents for the following: follow up from Mohs surgery   The patient presents for follow up from Mohs surgery for a BCC on the left shin, treated on 08/17/23, repaired with 2nd intention. The patient has been bandaging the wound as directed. The endorse the following concerns: none. Patient is accompanied by his wife.   The following portions of the chart were reviewed this encounter and updated as appropriate: medications, allergies, medical history  Review of Systems:  No other skin or systemic complaints except as noted in HPI or Assessment and Plan.  Objective  Well appearing patient in no apparent distress; mood and affect are within normal limits.  A full examination was performed including scalp, head, face and left shin. All findings within normal limits unless otherwise noted below.  Healing wound with mild erythema  Relevant physical exam findings are noted in the Assessment and Plan.     Assessment & Plan    Healing s/p Mohs for [facility] on left lower leg, treated on 08/17/23, repaired with 2nd intention. - Reassured that wound is healing well - No evidence of infection - No swelling, induration, purulence, dehiscence, or tenderness out of proportion to the clinical exam, see photo above - Discussed that scars take up to 12 months to mature from the date of surgery - Recommend SPF 30+ to scar daily to prevent purple color from UV exposure during scar maturation process - Discussed that erythema and raised appearance of scar will fade over the next 4-6 months - OK to start scar massage at 4-6 weeks post-op - Can consider silicone based products for scar healing starting at 6 weeks post-op - Ok to continue ointment daily to wound under a bandage for another few weeks  HISTORY OF BASAL CELL CARCINOMA OF THE SKIN - No evidence of recurrence today - Recommend regular full body skin exams - Recommend daily broad  spectrum sunscreen SPF 30+ to sun-exposed areas, reapply every 2 hours as needed.  - Call if any new or changing lesions are noted between office visits   Return if symptoms worsen or fail to improve.  I, Wilson Hasten, CMA, am acting as scribe for Deneise Finlay, MD.   Documentation: I have reviewed the above documentation for accuracy and completeness, and I agree with the above.  Deneise Finlay, MD

## 2023-10-12 NOTE — Patient Instructions (Signed)

## 2023-10-20 ENCOUNTER — Other Ambulatory Visit: Payer: Self-pay | Admitting: *Deleted

## 2023-10-20 MED ORDER — MELOXICAM 15 MG PO TABS: 15.0000 mg | ORAL_TABLET | Freq: Every day | ORAL | 0 refills | Status: DC

## 2023-11-16 ENCOUNTER — Encounter: Payer: Self-pay | Admitting: Dermatology

## 2023-11-29 ENCOUNTER — Other Ambulatory Visit: Payer: Self-pay | Admitting: *Deleted

## 2023-11-29 MED ORDER — MELOXICAM 15 MG PO TABS: 15.0000 mg | ORAL_TABLET | Freq: Every day | ORAL | 0 refills | Status: DC

## 2023-12-07 ENCOUNTER — Other Ambulatory Visit: Payer: Self-pay | Admitting: Cardiology

## 2023-12-11 ENCOUNTER — Other Ambulatory Visit: Payer: Self-pay | Admitting: *Deleted

## 2023-12-11 DIAGNOSIS — I10 Essential (primary) hypertension: Secondary | ICD-10-CM

## 2023-12-11 MED ORDER — LOSARTAN POTASSIUM 25 MG PO TABS: 25.0000 mg | ORAL_TABLET | Freq: Two times a day (BID) | ORAL | 1 refills | Status: DC

## 2024-01-01 ENCOUNTER — Other Ambulatory Visit: Payer: Self-pay

## 2024-01-01 DIAGNOSIS — M549 Dorsalgia, unspecified: Secondary | ICD-10-CM

## 2024-01-01 DIAGNOSIS — H5203 Hypermetropia, bilateral: Secondary | ICD-10-CM | POA: Diagnosis not present

## 2024-01-01 MED ORDER — MELOXICAM 15 MG PO TABS: 15.0000 mg | ORAL_TABLET | Freq: Every day | ORAL | 0 refills | Status: DC

## 2024-01-04 ENCOUNTER — Encounter: Payer: Self-pay | Admitting: Dermatology

## 2024-01-04 ENCOUNTER — Ambulatory Visit: Payer: Medicare Other | Admitting: Dermatology

## 2024-01-04 VITALS — BP 121/80 | HR 64

## 2024-01-04 DIAGNOSIS — Z85828 Personal history of other malignant neoplasm of skin: Secondary | ICD-10-CM | POA: Diagnosis not present

## 2024-01-04 DIAGNOSIS — Z1283 Encounter for screening for malignant neoplasm of skin: Secondary | ICD-10-CM

## 2024-01-04 DIAGNOSIS — D229 Melanocytic nevi, unspecified: Secondary | ICD-10-CM

## 2024-01-04 DIAGNOSIS — L814 Other melanin hyperpigmentation: Secondary | ICD-10-CM

## 2024-01-04 DIAGNOSIS — L570 Actinic keratosis: Secondary | ICD-10-CM

## 2024-01-04 DIAGNOSIS — D485 Neoplasm of uncertain behavior of skin: Secondary | ICD-10-CM

## 2024-01-04 DIAGNOSIS — D492 Neoplasm of unspecified behavior of bone, soft tissue, and skin: Secondary | ICD-10-CM

## 2024-01-04 DIAGNOSIS — D1801 Hemangioma of skin and subcutaneous tissue: Secondary | ICD-10-CM

## 2024-01-04 DIAGNOSIS — C44519 Basal cell carcinoma of skin of other part of trunk: Secondary | ICD-10-CM

## 2024-01-04 DIAGNOSIS — C4491 Basal cell carcinoma of skin, unspecified: Secondary | ICD-10-CM

## 2024-01-04 DIAGNOSIS — L905 Scar conditions and fibrosis of skin: Secondary | ICD-10-CM | POA: Diagnosis not present

## 2024-01-04 HISTORY — DX: Basal cell carcinoma of skin, unspecified: C44.91

## 2024-01-04 NOTE — Progress Notes (Signed)
 Follow-Up Visit   Subjective  Jeffrey [name] is a 83 y.o. male who presents for the following: Ak & UBSE  Patient present today for follow up visit for Ak. Patient was last evaluated on 01/04/23. At this visit patient had cryo Therapy completed while in office (B/L Cheeks). Patient reports sxs are better. Patient denies medication changes.  The patient has spots, moles and lesions to be evaluated, some may be new or changing and the patient has concerns that these could be cancer. Patient has 2 new spots of concern. He reports both areas have been there for a few months. He reports the areas are not symptomatic.  The following portions of the chart were reviewed this encounter and updated as appropriate: medications, allergies, medical history  Review of Systems:  No other skin or systemic complaints except as noted in HPI or Assessment and Plan.  Objective  Well appearing patient in no apparent distress; mood and affect are within normal limits.  A focused examination was performed of the following areas: Left abdomen and Right Abdomen  Relevant exam findings are noted in the Assessment and Plan.         Left Anterior Mandible, Left Temple Erythematous thin papules/macules with gritty scale.  Right Abdomen (side) - Upper 6 mm pink papule  Assessment & Plan   Hx of BCC Status post MOhs - Assessment: Well-healed scar on the right anterior tibia status post excision of basal cell carcinoma. Excision performed by Dr. Corey with confirmed clear margins. Site healed very well - Plan:    Continue monitoring for proper healing    No further intervention required at this time  2. Actinic keratoses - Assessment: Previously prescribed topical cream for precancerous lesions (actinic keratoses). Patient reports good tolerability. Current examination reveals good overall skin condition with only a few areas requiring cryotherapy. - Plan:    Perform cryotherapy on left temple and right  mandible for actinic keratoses    Continue monitoring for new lesions  3. Suspected basal cell carcinoma - Assessment: 6 mm pink papule identified on the right abdomen, suspicious for basal cell carcinoma. Not present on previous examinations, patient noticed it a couple of weeks ago. - Plan:    Perform shave biopsy of 6 mm pink papule on right abdomen     - Apply local anesthesia prior to procedure     - Send specimen for pathological examination    Patient education:     - Instruct on wound care:       - Keep area covered with Vaseline and a band-aid       - Keep area dry    If pathology confirms skin cancer, refer back to Dr. Paci for further management    Await pathology results before determining need for further intervention  4. Routine skin surveillance - Assessment: Comprehensive skin examination performed. Multiple benign lesions noted, including seborrheic keratoses on the back and a dilated pore. Overall, patient's skin in good condition with only a few areas requiring intervention. - Plan:    Schedule follow-up appointment in 6 months (January) for routine skin surveillance    Advise patient to return sooner if any suspicious lesions are noticed AK (ACTINIC KERATOSIS) (2) Left Anterior Mandible, Left Temple Destruction of lesion - Left Anterior Mandible, Left Temple Complexity: simple   Destruction method: cryotherapy   Informed consent: discussed and consent obtained   Timeout:  patient name, date of birth, surgical site, and procedure verified Lesion destroyed using liquid  nitrogen: Yes   Cryotherapy cycles:  2 Post-procedure details: wound care instructions given    NEOPLASM OF UNCERTAIN BEHAVIOR OF SKIN Right Abdomen (side) - Upper Epidermal / dermal shaving  Lesion diameter (cm):  0.6 Informed consent: discussed and consent obtained   Timeout: patient name, date of birth, surgical site, and procedure verified   Procedure prep:  Patient was prepped and draped in  usual sterile fashion Prep type:  Isopropyl alcohol Anesthesia: the lesion was anesthetized in a standard fashion   Anesthetic:  1% lidocaine  w/ epinephrine  1-100,000 buffered w/ 8.4% NaHCO3 Instrument used: DermaBlade   Hemostasis achieved with: pressure, aluminum chloride and electrodesiccation   Outcome: patient tolerated procedure well   Post-procedure details: sterile dressing applied and wound care instructions given   Dressing type: bandage and petrolatum    Specimen 1 - Surgical pathology Differential Diagnosis: R/O BCC  Check Margins: No  Return in about 6 months (around 07/06/2024) for AK F/U.  I, Jetta Ager, am acting as Neurosurgeon for [employer], DO.  Documentation: I have reviewed the above documentation for accuracy and completeness, and I agree with the above.  Delon Lenis, DO

## 2024-01-04 NOTE — Patient Instructions (Addendum)

## 2024-01-05 LAB — SURGICAL PATHOLOGY

## 2024-01-08 ENCOUNTER — Ambulatory Visit: Payer: Self-pay | Admitting: Dermatology

## 2024-01-08 NOTE — Progress Notes (Signed)
 Please call pt and notify their bx results were positive for a skin CA that will be excised by Dr. Corey  Diagnosis Skin , right abdomen (side) - upper BASAL CELL CARCINOMA, NODULAR PATTERN

## 2024-01-09 ENCOUNTER — Encounter: Payer: Self-pay | Admitting: Dermatology

## 2024-01-09 NOTE — Telephone Encounter (Signed)
-----   Message from Delon Lenis sent at 01/08/2024  3:14 PM EDT ----- Please call pt and notify their bx results were positive for a skin CA that will be excised by Dr. Corey  Diagnosis Skin , right abdomen (side) - upper BASAL CELL CARCINOMA, NODULAR PATTERN ----- Message ----- From: Interface, Lab In [facility] Sent: 01/05/2024   6:09 PM EDT To: Delon LOISE Lenis, DO

## 2024-01-09 NOTE — Telephone Encounter (Signed)
Left message for patient to call office for results/hd 

## 2024-01-10 NOTE — Telephone Encounter (Signed)
-----   Message from Delon Lenis sent at 01/08/2024  3:14 PM EDT ----- Please call pt and notify their bx results were positive for a skin CA that will be excised by Dr. Corey  Diagnosis Skin , right abdomen (side) - upper BASAL CELL CARCINOMA, NODULAR PATTERN ----- Message ----- From: Interface, Lab In [facility] Sent: 01/05/2024   6:09 PM EDT To: Delon LOISE Lenis, DO

## 2024-01-10 NOTE — Telephone Encounter (Signed)
Left message for patient to call office for results/hd 

## 2024-01-11 NOTE — Telephone Encounter (Signed)
-----   Message from Delon Lenis sent at 01/08/2024  3:14 PM EDT ----- Please call pt and notify their bx results were positive for a skin CA that will be excised by Dr. Corey  Diagnosis Skin , right abdomen (side) - upper BASAL CELL CARCINOMA, NODULAR PATTERN ----- Message ----- From: Interface, Lab In [facility] Sent: 01/05/2024   6:09 PM EDT To: Delon LOISE Lenis, DO

## 2024-01-11 NOTE — Telephone Encounter (Signed)
 Advised patient of results and sent information to the schedulers to be scheduled for excision with Dr Paci/hd

## 2024-01-23 DIAGNOSIS — K080 Exfoliation of teeth due to systemic causes: Secondary | ICD-10-CM | POA: Diagnosis not present

## 2024-01-25 DIAGNOSIS — H1132 Conjunctival hemorrhage, left eye: Secondary | ICD-10-CM | POA: Diagnosis not present

## 2024-01-26 ENCOUNTER — Encounter (INDEPENDENT_AMBULATORY_CARE_PROVIDER_SITE_OTHER): Payer: Self-pay | Admitting: Otolaryngology

## 2024-01-26 ENCOUNTER — Ambulatory Visit (INDEPENDENT_AMBULATORY_CARE_PROVIDER_SITE_OTHER): Payer: Medicare Other | Admitting: Otolaryngology

## 2024-01-26 VITALS — BP 155/89 | HR 70 | Ht 68.0 in | Wt 145.0 lb

## 2024-01-26 DIAGNOSIS — H6123 Impacted cerumen, bilateral: Secondary | ICD-10-CM | POA: Diagnosis not present

## 2024-01-26 DIAGNOSIS — Z85810 Personal history of malignant neoplasm of tongue: Secondary | ICD-10-CM

## 2024-01-28 DIAGNOSIS — Z85810 Personal history of malignant neoplasm of tongue: Secondary | ICD-10-CM | POA: Insufficient documentation

## 2024-01-28 NOTE — Progress Notes (Signed)
 Patient ID: Jeffrey [name], male   DOB: [birth year], 83 y.o.   MRN: [id number]  Follow-up: Base of tongue squamous cell carcinoma  HPI: The patient is an 83 year old male who returns today for his follow-up evaluation.  The patient has a history of tongue base squamous cell carcinoma, with right neck metastasis.  He completed his chemoradiation treatment in July 2022.  The patient returns today reporting no difficulty since his last visit 6 months ago.  He denies any significant dysphagia, odynophagia, or dyspnea.  He complains today of clogging sensation in his ears.  Exam: General: Communicates without difficulty, well nourished, no acute distress. Head: Normocephalic, no evidence injury, no tenderness, facial buttresses intact without stepoff. Face/sinus: No tenderness to palpation and percussion. Facial movement is normal and symmetric. Eyes: PERRL, EOMI. No scleral icterus, conjunctivae clear. Neuro: CN II exam reveals vision grossly intact.  No nystagmus at any point of gaze. Ears: Auricles well formed without lesions.  Bilateral cerumen impaction.  Nose: External evaluation reveals normal support and skin without lesions.  Dorsum is intact.  Anterior rhinoscopy reveals congested mucosa over anterior aspect of inferior turbinates and intact septum.  No purulence noted. Oral:  Oral cavity and oropharynx are intact, symmetric, without erythema or edema.  Mucosa is moist without lesions.  Indirect laryngoscopy shows no suspicious lesion.  Neck: Full range of motion without pain.  There is no significant lymphadenopathy.  No masses palpable.  His neck incision is well-healed.  Trachea is midline. Neuro:  CN 2-12 grossly intact.    Procedure: Bilateral cerumen disimpaction Anesthesia: None Description: Under the operating microscope, the cerumen is carefully removed with a combination of cerumen currette, alligator forceps, and suction catheters.  After the cerumen is removed, the TMs are noted to be  normal.  No mass, erythema, or lesions. The patient tolerated the procedure well.     Assessment: 1.  Bilateral recurrent cerumen impaction.  After the disimpaction procedure, both tympanic membranes and middle ear spaces are noted to be normal. 2.  History of tongue base squamous cell carcinoma.  There is no evidence of residual or recurrent disease.  Plan: 1.  The physical exam findings are reviewed with the patient. 2.  Otomicroscopy with bilateral cerumen disimpaction. 3.  The patient will return for reevaluation in 6 months, sooner if needed.

## 2024-02-01 DIAGNOSIS — H1132 Conjunctival hemorrhage, left eye: Secondary | ICD-10-CM | POA: Diagnosis not present

## 2024-02-05 ENCOUNTER — Other Ambulatory Visit: Payer: Self-pay | Admitting: *Deleted

## 2024-02-05 DIAGNOSIS — M549 Dorsalgia, unspecified: Secondary | ICD-10-CM

## 2024-02-05 MED ORDER — MELOXICAM 15 MG PO TABS: 15.0000 mg | ORAL_TABLET | Freq: Every day | ORAL | 0 refills | Status: DC

## 2024-02-07 ENCOUNTER — Encounter: Payer: Self-pay | Admitting: Dermatology

## 2024-02-08 DIAGNOSIS — H1132 Conjunctival hemorrhage, left eye: Secondary | ICD-10-CM | POA: Diagnosis not present

## 2024-02-13 ENCOUNTER — Ambulatory Visit (INDEPENDENT_AMBULATORY_CARE_PROVIDER_SITE_OTHER): Admitting: Dermatology

## 2024-02-13 ENCOUNTER — Encounter: Payer: Self-pay | Admitting: Dermatology

## 2024-02-13 VITALS — BP 138/69 | HR 70

## 2024-02-13 DIAGNOSIS — C44519 Basal cell carcinoma of skin of other part of trunk: Secondary | ICD-10-CM | POA: Diagnosis not present

## 2024-02-13 DIAGNOSIS — C4491 Basal cell carcinoma of skin, unspecified: Secondary | ICD-10-CM

## 2024-02-13 DIAGNOSIS — L988 Other specified disorders of the skin and subcutaneous tissue: Secondary | ICD-10-CM | POA: Diagnosis not present

## 2024-02-13 NOTE — Patient Instructions (Signed)

## 2024-02-13 NOTE — Progress Notes (Signed)
 Follow-Up Visit   Subjective  Carmine Carrozza is a 83 y.o. male who presents for the following: Excision of a Nodular Basal Cell Carcinoma On the right abdomen--upper, referred by Dr. Alm.  The following portions of the chart were reviewed this encounter and updated as appropriate: medications, allergies, medical history  Review of Systems:  No other skin or systemic complaints except as noted in HPI or Assessment and Plan.  Objective  Well appearing patient in no apparent distress; mood and affect are within normal limits.  A focused examination was performed of the following areas: Right abdomen--upper Relevant physical exam findings are noted in the Assessment and Plan.   Right Abdomen - Upper Pink pearly papule or plaque with arborizing vessels.      Assessment & Plan   BASAL CELL CARCINOMA (BCC), UNSPECIFIED SITE Right Abdomen - Upper Skin excision  Excision method:  elliptical Lesion length (cm):  1.5 Lesion width (cm):  0.7 Margin per side (cm):  0.5 Total excision diameter (cm):  2.5 Informed consent: discussed and consent obtained   Timeout: patient name, date of birth, surgical site, and procedure verified   Procedure prep:  Patient was prepped and draped in usual sterile fashion Prep type:  Chlorhexidine  Anesthesia: the lesion was anesthetized in a standard fashion   Anesthetic:  1% lidocaine  w/ epinephrine  1-100,000 buffered w/ 8.4% NaHCO3 Instrument used: #15 blade   Hemostasis achieved with: suture, pressure and electrodesiccation   Hemostasis achieved with comment:  3.0 PDS with dermabond and steri strips Outcome: patient tolerated procedure well with no complications   Post-procedure details: sterile dressing applied and wound care instructions given   Dressing type: petrolatum and bandage   Additional details:  Final length 4.5  Skin repair Complexity:  Complex Final length (cm):  4.5 Informed consent: discussed and consent obtained   Timeout:  patient name, date of birth, surgical site, and procedure verified   Procedure prep:  Patient was prepped and draped in usual sterile fashion Prep type:  Chlorhexidine  Anesthesia: the lesion was anesthetized in a standard fashion   Anesthetic:  1% lidocaine  w/ epinephrine  1-100,000 buffered w/ 8.4% NaHCO3 Reason for type of repair: reduce tension to allow closure, preserve normal anatomical and functional relationships, avoid adjacent structures and allow side-to-side closure without requiring a flap or graft   Undermining: area extensively undermined   Subcutaneous layers (deep stitches):  Suture size:  3-0 Suture type: PDS (polydioxanone)   Stitches:  Buried vertical mattress Fine/surface layer approximation (top stitches):  Suture type: cyanoacrylate tissue glue   Hemostasis achieved with: suture, pressure and electrodesiccation Outcome: patient tolerated procedure well with no complications   Post-procedure details: sterile dressing applied and wound care instructions given   Dressing type: petrolatum and pressure dressing    Specimen 1 - Surgical pathology Differential Diagnosis: BCC [id number] Check Margins: No  The surgical wound was then cleaned, prepped, and re-anesthetized as above. Wound edges were undermined extensively along at least one entire edge and at a distance equal to or greater than the width of the defect (see wound defect size above) in order to achieve closure and decrease wound tension and anatomic distortion. Redundant tissue repair including standing cone removal was performed. Hemostasis was achieved with electrocautery. Subcutaneous and epidermal tissues were approximated with the above sutures. The surgical site was then lightly scrubbed with sterile, saline-soaked gauze. Steri-strips were applied, and the area was then bandaged using Vaseline ointment, non-adherent gauze, gauze pads, and tape to provide an adequate pressure  dressing. The patient tolerated  the procedure well, was given detailed written and verbal wound care instructions, and was discharged in good condition.   The patient will follow-up: PRN.  Return if symptoms worsen or fail to improve.  I, Darice Smock, CMA, am acting as scribe for RUFUS CHRISTELLA HOLY, MD.   Documentation: I have reviewed the above documentation for accuracy and completeness, and I agree with the above.  RUFUS CHRISTELLA HOLY, MD

## 2024-02-15 LAB — SURGICAL PATHOLOGY

## 2024-02-16 ENCOUNTER — Ambulatory Visit: Payer: Self-pay | Admitting: Dermatology

## 2024-02-22 ENCOUNTER — Inpatient Hospital Stay: Payer: Medicare Other | Attending: Hematology and Oncology | Admitting: Hematology and Oncology

## 2024-02-22 VITALS — BP 166/102 | HR 69 | Temp 98.3°F | Resp 17 | Wt 140.6 lb

## 2024-02-22 DIAGNOSIS — Z85818 Personal history of malignant neoplasm of other sites of lip, oral cavity, and pharynx: Secondary | ICD-10-CM | POA: Diagnosis not present

## 2024-02-22 DIAGNOSIS — Z923 Personal history of irradiation: Secondary | ICD-10-CM | POA: Diagnosis not present

## 2024-02-22 DIAGNOSIS — C109 Malignant neoplasm of oropharynx, unspecified: Secondary | ICD-10-CM

## 2024-02-22 DIAGNOSIS — Z9221 Personal history of antineoplastic chemotherapy: Secondary | ICD-10-CM | POA: Diagnosis not present

## 2024-02-22 NOTE — Progress Notes (Signed)
 [facility]   [phone] [phone]   Clinic Follow up Note   Patient Care Team: Jeffrey Worth HERO, MD as PCP - General (Family Medicine) Jeffrey Slain, MD as PCP - Cardiology (Cardiology) Jeffrey [name] (Inactive) as Consulting Physician (Dentistry) Jeffrey Gaile ORN, MD as Consulting Physician (Vascular Surgery) [name], Andrez SAUNDERS, PA-C (Inactive) as Physician Assistant (Dermatology) Malmfelt, Delon CROME, RN as Oncology Nurse Navigator Jeffrey Domino, MD as Consulting Physician (Radiation Oncology) Jeffrey Clunes, MD as Consulting Physician (Otolaryngology) Jeffrey Ash, MD as Consulting Physician (Hematology and Oncology) Jeffrey [name], [facility] (Inactive) as Pharmacist (Pharmacist) Livingston Rigg, MD as Consulting Physician (Dermatology)  Date of Service:  02/22/2024  CHIEF COMPLAINT: f/u of oropharyngeal cancer after completion of chemoradiation.  SUMMARY OF ONCOLOGIC HISTORY: Oncology History Overview Note  Patient first noticed to have neck mass about 2 months prior to presentation. He denies any complaints except for neck mass, no difficulty swallowing, pain at the base of the tongue. He went to see his PCP who recommended an US  which demonstrated hypoechoic mass at the right level II a highly concerning for an enlarged pathologic LN. He was referred to ENT and CT neck was ordered. CT neck showed enlarged right level 2 node, asymmetric soft tissue at the right tongue base, favored to reflect asymmetric tonsillar issue. 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 PET CT showed primary mucosal neoplasm in the right tongue base area demonstrating hypermetabolism.    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HCC)  10/21/2020 Initial Diagnosis   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HCC)   10/21/2020 Cancer Staging   Staging form: Pharynx - HPV-Mediated  Oropharynx, AJCC 8th Edition - Clinical stage from 10/21/2020: Stage I (cT1, cN1, cM0, p16+) - Signed by Jeffrey Ash, MD on 10/21/2020 Stage prefix: Initial diagnosis   11/10/2020 - 12/24/2020 Chemotherapy   Patient is on Treatment Plan : HEAD/NECK Cisplatin  q7d       CURRENT THERAPY:  None, completed chemotherapy. Surveillance  [name] is here for a follow up of oropharyngeal cancer. He completed radiation on 12/29/2020.  He had 4 cycles of chemo, dose reduced, received a total dose of 100 mg per metered square. He couldn't tolerate any further chemotherapy End of treatment PET with complete response.   Discussed the use of AI scribe software for clinical note transcription with the patient, who gave verbal consent to proceed.  History of Present Illness Jeffrey [name] is an 83 year old male who presents with a history of eye bleeding related to Advil use.  He experienced subconjunctival hemorrhage which was attributed to blood thinning from daily Advil use for back pain. Since discontinuing Advil, the eye condition has significantly improved.  He has a history of back pain stemming from a fall out of a tree approximately 20 years ago, resulting in fractured vertebrae. One vertebra eventually collapsed, and he consulted a spine surgeon who considered a procedure to fill the vertebra with cement but was concerned about a vein wrapped around the vertebra.  He reports that his recent laryngoscopy looked good and that he was told there was no cancer.  He takes blood pressure medication once daily. No issues with breathing, cough, chest pain, shortness of breath, changes in bowel habits, or urination.   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   Right Buccal Cheek(MOHS)   Nodular basal cell carcinoma (BCC)    Left lower leg, anterior treated 08/17/23 w Mohs   Osteopenia     SURGICAL HISTORY: Past Surgical History:  Procedure Laterality Date   ABDOMINAL AORTIC ENDOVASCULAR FENESTRATED STENT GRAFT N/A 03/21/2018   Procedure: ABDOMINAL AORTIC ENDOVASCULAR FENESTRATED STENT GRAFT ; DYNA CT PERFORMED;  Surgeon: Jeffrey Gaile ORN, MD;  Location: [facility] OR;  Service: Vascular;  Laterality: N/A;   Basal Skin Cancer  2012   CATARACT EXTRACTION Bilateral    IR GASTROSTOMY TUBE MOD SED  11/06/2020   IR GASTROSTOMY TUBE REMOVAL  04/19/2021   IR IMAGING GUIDED PORT INSERTION  11/06/2020   IR REMOVAL TUN ACCESS W/ PORT W/O FL MOD SED  07/15/2021   MASS BIOPSY Right 09/25/2020   Procedure: OPEN NECK MASS BIOPSY;  Surgeon: Jeffrey Clunes, MD;  Location: [facility];  Service: ENT;  Laterality: Right;   MASS EXCISION     x 2; throat and above lip   TONSILLECTOMY     TRANSURETHRAL RESECTION OF PROSTATE      ALLERGIES:  is allergic to ketotifen fumarate, mixed ragweed, and triamcinolone.  MEDICATIONS:  Current Outpatient Medications  Medication Sig Dispense Refill   aspirin  EC 81 MG tablet Take 1 tablet (81 mg total) by mouth daily. Swallow whole. 90 tablet 3   atorvastatin  (LIPITOR) 40 MG tablet TAKE 1 TABLET(40 MG) BY MOUTH DAILY AT 6 PM 90 tablet 0   losartan  (COZAAR ) 25 MG tablet Take 1 tablet (25 mg total) by mouth 2 (two) times daily. 180 tablet 1   meloxicam  (MOBIC ) 15 MG tablet Take 1 tablet (15 mg total) by mouth daily. 30 tablet 0   amLODipine  (NORVASC ) 5 MG tablet Take 1 tablet (5 mg total) by mouth daily. 1st attempt. Pt needs yearly appointment for # 90 day supply or any future refills. Please call office to schedule appointment. (Patient not taking: Reported on 02/22/2024) 30 tablet 0    ARTIFICIAL TEAR SOLUTION OP Place 1 drop into both eyes daily as needed (dry eyes). (Patient not taking: Reported on 02/22/2024)     cyanocobalamin  (VITAMIN B12) 1000 MCG tablet Take 1,000 mcg by mouth daily.     mupirocin  ointment (BACTROBAN ) 2 % APPLY TOPICALLY TO THE AFFECTED AREA DAILY WITH DRESSING CHANGES (Patient not taking: Reported on 02/22/2024) 30 g 2   mupirocin  ointment (BACTROBAN ) 2 % Apply 1 Application topically 2 (two) times daily. (Patient not taking: Reported on 02/22/2024) 22 g 1   No current facility-administered medications for this visit.    PHYSICAL EXAMINATION: ECOG PERFORMANCE STATUS: 2 - Symptomatic, <50% confined to bed  Vitals:   02/22/24 1304  BP: (!) 186/89  Pulse: 69  Resp: 17  Temp: 98.3 F (36.8 C)  SpO2: 100%    Physical Exam Constitutional:      Appearance: Normal appearance.  HENT:     Head: Normocephalic and atraumatic.     Mouth/Throat:     Comments: BOT looks normal. Cardiovascular:     Rate and Rhythm: Normal rate and regular rhythm.  Pulmonary:     Effort: Pulmonary effort is normal.  Breath sounds: Normal breath sounds.  Abdominal:     General: Abdomen is flat. Bowel sounds are normal.  Musculoskeletal:     Cervical back: Normal range of motion and neck supple.  Neurological:     General: No focal deficit present.     Mental Status: He is alert.  Psychiatric:        Mood and Affect: Mood normal.      LABORATORY DATA:  I have reviewed the data as listed    Latest Ref Rng & Units 04/17/2023    1:48 PM 02/21/2023   12:45 PM 02/17/2022   12:09 PM  CBC  WBC 4.0 - 10.5 K/uL 5.7  5.4  4.0   Hemoglobin 13.0 - 17.0 g/dL 9.8  9.8  89.8   Hematocrit 39.0 - 52.0 % 29.1  29.0  28.3   Platelets 150.0 - 400.0 K/uL 157.0  146  141         Latest Ref Rng & Units 04/17/2023    1:48 PM 02/21/2023   12:45 PM 02/17/2022   12:09 PM  CMP  Glucose 70 - 99 mg/dL 90  90  95   BUN 6 - 23 mg/dL 23  23  17    Creatinine 0.40 - 1.50 mg/dL  8.93  8.88  8.93   Sodium 135 - 145 mEq/L 128  130  129   Potassium 3.5 - 5.1 mEq/L 4.0  4.4  4.0   Chloride 96 - 112 mEq/L 96  98  97   CO2 19 - 32 mEq/L 25  25  27    Calcium  8.4 - 10.5 mg/dL 9.3  9.6  9.7   Total Protein 6.0 - 8.3 g/dL 7.5  7.9  7.8   Total Bilirubin 0.2 - 1.2 mg/dL 0.8  0.9  0.8   Alkaline Phos 39 - 117 U/L 99  91  78   AST 0 - 37 U/L 16  14  18    ALT 0 - 53 U/L 12  9  12      RADIOGRAPHIC STUDIES: I have personally reviewed the radiological images as listed and agreed with the findings in the report. No results found.   ASSESSMENT & PLAN:   Jeffrey [name] is a 83 y.o. male with SCC oropharynx  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HCC), TxN1M0, HPV(+) CT neck showed enlarged right level 2 node, asymmetric soft tissue at the right tongue base, favored to reflect asymmetric tonsillar issue. -Initial biopsy was inconclusive so he had an excisional biopsy which showed P16 positive right neck poorly differentiated SCC. -PET CT showed primary mucosal neoplasm in the right tongue base area demonstrating hypermetabolism. -He was started on concurrent chemoradiation therapy with cisplatin  on 11/09/20. -He developed worsening thrombocytopenia, anemia and leukopenia following cycle 2. Chemo has been held since 11/19/20. -He overall got 100 mg per metered square of cisplatin , multiple delays due to cytopenias in the last 2 cycles, he only got 20 mg per metered square.   End of treatment PET with no concern for residual disease.   -No concern for recurrence on exam today.  He saw Dr. Karis recently, laryngoscopy looked normal. No concern for recurrence. Last TSH in Oct normal. He should continue annual TSH testing at his PCP's office.    Plan:  -Completed chemotherapy and radiation, not very well-tolerated. RTC in 69yr .    Amber Stalls, MD 02/22/2024

## 2024-03-04 ENCOUNTER — Encounter: Payer: Self-pay | Admitting: Family Medicine

## 2024-03-04 ENCOUNTER — Ambulatory Visit (INDEPENDENT_AMBULATORY_CARE_PROVIDER_SITE_OTHER): Admitting: Family Medicine

## 2024-03-04 VITALS — BP 166/93 | HR 60 | Temp 97.2°F | Ht 68.0 in | Wt 140.6 lb

## 2024-03-04 DIAGNOSIS — Z23 Encounter for immunization: Secondary | ICD-10-CM | POA: Diagnosis not present

## 2024-03-04 DIAGNOSIS — I10 Essential (primary) hypertension: Secondary | ICD-10-CM | POA: Diagnosis not present

## 2024-03-04 MED ORDER — AMLODIPINE BESYLATE 5 MG PO TABS: 5.0000 mg | ORAL_TABLET | Freq: Every day | ORAL | 3 refills | Status: AC

## 2024-03-04 NOTE — Patient Instructions (Signed)
 It was very nice to see you today!  VISIT SUMMARY: During your visit, we discussed your concerns about weight loss and elevated blood pressure. We reviewed your current medications and made some adjustments to help manage your blood pressure more effectively.  YOUR PLAN: ESSENTIAL HYPERTENSION: Your blood pressure is elevated, likely due to stopping one of your medications. -Restart taking amlodipine  5 mg daily. -Continue taking losartan  25 mg twice daily. -Monitor your blood pressure at home regularly. -Come back for a blood pressure check at your next appointment.  UNINTENTIONAL WEIGHT LOSS, NOW STABILIZED: Your weight loss seems to have stabilized, but we need to ensure you are getting enough calories and nutrients. -Increase your caloric and protein intake through your diet. -Consider using nutritional supplements like Boost or Ensure. -Monitor your weight and dietary intake. -We will reassess your weight and diet at your next follow-up appointment.  Return in about 6 weeks (around 04/15/2024) for Annual Physical.   Take care, Dr Kennyth  PLEASE NOTE:  If you had any lab tests, please let us  know if you have not heard back within a few days. You may see your results on mychart before we have a chance to review them but we will give you a call once they are reviewed by us .   If we ordered any referrals today, please let us  know if you have not heard from their office within the next week.   If you had any urgent prescriptions sent in today, please check with the pharmacy within an hour of our visit to make sure the prescription was transmitted appropriately.   Please try these tips to maintain a healthy lifestyle:  Eat at least 3 REAL meals and 1-2 snacks per day.  Aim for no more than 5 hours between eating.  If you eat breakfast, please do so within one hour of getting up.   Each meal should contain half fruits/vegetables, one quarter protein, and one quarter carbs (no bigger  than a computer mouse)  Cut down on sweet beverages. This includes juice, soda, and sweet tea.   Drink at least 1 glass of water with each meal and aim for at least 8 glasses per day  Exercise at least 150 minutes every week.

## 2024-03-04 NOTE — Progress Notes (Signed)
   Tannor Pyon is a 83 y.o. male who presents today for an office visit.  Assessment/Plan:  New/Acute Problems: Weight Loss  Patient's weight has stabilized since increasing his caloric intake.  Sounds like this was likely dietary mediated.  We did discuss additional workup today including labs however hold off on this for now.  Recently saw oncology and ENT without any signs of recurrence.  He will follow-up with me in 4 to 6 weeks for CPE and we can recheck weight at that time.  Will check labs at that time as well.  Chronic Problems Addressed Today: Essential hypertension Blood pressure elevated today and has been elevated at recent office visits with his other doctors.  He has been off of amlodipine  for several months.  Will restart today.  5 mg daily.  Will also continue his losartan  25 mg daily.  He will monitor at home and follow-up with us  in a few weeks if persistently elevated.  Also has upcoming appointment with cardiology as well.  Will recheck in office here in 4 to 6 weeks at his CPE.  Flu vaccine given today.     Subjective:  HPI:  See assessment / plan for status of chronic conditions.    Discussed the use of AI scribe software for clinical note transcription with the patient, who gave verbal consent to proceed.  History of Present Illness Adarian Bur is an 83 year old male who presents with concerns about weight loss and elevated blood pressure.  He has experienced recent unintentional weight loss, which he attributes to changes in eating habits and a lack of appetite.  Recently, he has been trying to eat larger portions and not leave the restaurant until he finishes his meal. He has also started incorporating snacks like cheese and pecans soaked in maple syrup to increase his calorie intake. He thinks his weight has stabilized over the past few weeks.  He reports elevated blood pressure readings, first noted several weeks ago. He acknowledges that his blood  pressure tends to rise in medical settings. He has resumed monitoring his blood pressure at home and is consistent with taking losartan  twice daily. He previously stopped taking amlodipine  for the last several weeks to months         Objective:  Physical Exam: BP (!) 166/93   Pulse 60   Temp (!) 97.2 F (36.2 C) (Temporal)   Ht 5' 8 (1.727 m)   Wt 140 lb 9.6 oz (63.8 kg)   SpO2 99%   BMI 21.38 kg/m   Wt Readings from Last 3 Encounters:  03/04/24 140 lb 9.6 oz (63.8 kg)  02/22/24 140 lb 9.6 oz (63.8 kg)  01/26/24 145 lb (65.8 kg)  Gen: No acute distress, resting comfortably CV: Regular rate and rhythm with no murmurs appreciated Pulm: Normal work of breathing, clear to auscultation bilaterally with no crackles, wheezes, or rhonchi Neuro: Grossly normal, moves all extremities Psych: Normal affect and thought content      Aerie Donica M. Kennyth, MD 03/04/2024 10:45 AM

## 2024-03-04 NOTE — Assessment & Plan Note (Signed)
 Blood pressure elevated today and has been elevated at recent office visits with his other doctors.  He has been off of amlodipine  for several months.  Will restart today.  5 mg daily.  Will also continue his losartan  25 mg daily.  He will monitor at home and follow-up with us  in a few weeks if persistently elevated.  Also has upcoming appointment with cardiology as well.  Will recheck in office here in 4 to 6 weeks at his CPE.

## 2024-03-05 ENCOUNTER — Other Ambulatory Visit: Payer: Self-pay | Admitting: Family Medicine

## 2024-03-05 ENCOUNTER — Encounter: Payer: Self-pay | Admitting: Family Medicine

## 2024-03-05 ENCOUNTER — Telehealth: Payer: Self-pay | Admitting: *Deleted

## 2024-03-05 DIAGNOSIS — M549 Dorsalgia, unspecified: Secondary | ICD-10-CM

## 2024-03-05 NOTE — Telephone Encounter (Signed)
 Copied from CRM [id number]. Topic: Clinical - Prescription Issue >> Mar 05, 2024  9:10 AM Pinkey ORN wrote: Reason for CRM: amLODipine  (NORVASC ) 5 MG tablet   Rx send in 03/04/2024 [facility]

## 2024-03-06 NOTE — Progress Notes (Signed)
 Cardiology Office Note:  .   Date:  03/07/2024  ID:  Jeffrey [name], DOB 1941/03/30, MRN [id number] PCP: Kennyth Worth HERO, MD  [facility] Providers Cardiologist:  Shelda Bruckner, MD {  History of Present Illness: .   Jeffrey [name] is a 83 y.o. male with a hx of AAA s/p EVAR by Dr. Serene, PAD, hypertension, coronary artery calcification and then squamous cell carcinoma of oropharynx s/p radiation and chemo who is seen for follow up today. I initially met him 02/02/18 as a new consult at the request of Dr. Kennyth for preoperative evaluation prior to vascular surgery.   Today: Noted to have elevated blood pressure at recent visit with Dr. Kennyth, amlodipine  restarted. Recently saw Dr. Loretha, no clinical recurrence of his cancer.  Just started increased dose of amlodipine  2 days ago, home blood pressure readings at home have been improving. Was 132/70 at home this morning. Tolerating well.  Trying to maintain weight, eating ice cream/cheese/nuts. Walks around the house with his cane.   ROS: Denies chest pain, shortness of breath at rest or with normal exertion. No PND, orthopnea, LE edema or unexpected weight gain. No syncope or palpitations. ROS otherwise negative except as noted.   Studies Reviewed: SABRA    EKG:  EKG Interpretation Date/Time:  Thursday March 07 2024 11:12:16 EDT Ventricular Rate:  67 PR Interval:  162 QRS Duration:  98 QT Interval:  408 QTC Calculation: 431 R Axis:   -32  Text Interpretation: Normal sinus rhythm Left axis deviation Cannot rule out Anterior infarct , age undetermined Confirmed by Bruckner Shelda [phone]) on 03/07/2024 11:48:20 AM    Physical Exam:   VS:  BP 132/70 Comment: home  Pulse 67   Ht 5' 9 (1.753 m)   Wt 143 lb (64.9 kg)   SpO2 96%   BMI 21.12 kg/m    Wt Readings from Last 3 Encounters:  03/07/24 143 lb (64.9 kg)  03/04/24 140 lb 9.6 oz (63.8 kg)  02/22/24 140 lb 9.6 oz (63.8 kg)    GEN: Well nourished, well  developed in no acute distress HEENT: Normal, moist mucous membranes NECK: No JVD CARDIAC: regular rhythm, normal S1 and S2, no rubs or gallops. No murmur. VASCULAR: Radial and DP pulses 2+ bilaterally. No carotid bruits RESPIRATORY:  Clear to auscultation without rales, wheezing or rhonchi  ABDOMEN: Soft, non-tender, non-distended MUSCULOSKELETAL:  Ambulates independently SKIN: Warm and dry, no edema NEUROLOGIC:  Alert and oriented x 3. No focal neuro deficits noted. PSYCHIATRIC:  Normal affect    ASSESSMENT AND PLAN: .    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 Serene -continue statin -restarted aspirin  with improvement in blood counts 02/2022, no issues -last seen by Dr. Serene [phone]. 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 Kennyth with restart of amlodipine  -continue 25 mg losartan  BID -continue amlodipine  5 mg daily   Coronary calcification on imaging Hyperlipidemia -LDL goal <70 give AAA, coronary calcification. Last LDL 44 -tolerating high intensity statin, atorvastatin  40 mg daily -aspirin  as above  Dispo: 1 year or sooner as needed  Signed, Shelda Bruckner, MD   Shelda Bruckner, MD, PhD, [facility]  [facility]  [place]  [facility] at [facility] at [facility]  [address] [place], [place] [address] [phone]

## 2024-03-07 ENCOUNTER — Encounter (HOSPITAL_BASED_OUTPATIENT_CLINIC_OR_DEPARTMENT_OTHER): Payer: Self-pay | Admitting: Cardiology

## 2024-03-07 ENCOUNTER — Ambulatory Visit (HOSPITAL_BASED_OUTPATIENT_CLINIC_OR_DEPARTMENT_OTHER): Admitting: Cardiology

## 2024-03-07 VITALS — BP 132/70 | HR 67 | Ht 69.0 in | Wt 143.0 lb

## 2024-03-07 DIAGNOSIS — Z9889 Other specified postprocedural states: Secondary | ICD-10-CM

## 2024-03-07 DIAGNOSIS — I10 Essential (primary) hypertension: Secondary | ICD-10-CM

## 2024-03-07 DIAGNOSIS — I739 Peripheral vascular disease, unspecified: Secondary | ICD-10-CM

## 2024-03-07 DIAGNOSIS — I2510 Atherosclerotic heart disease of native coronary artery without angina pectoris: Secondary | ICD-10-CM

## 2024-03-07 DIAGNOSIS — E7800 Pure hypercholesterolemia, unspecified: Secondary | ICD-10-CM

## 2024-03-07 MED ORDER — ATORVASTATIN CALCIUM 40 MG PO TABS: 40.0000 mg | ORAL_TABLET | Freq: Every day | ORAL | 3 refills | Status: AC

## 2024-03-07 NOTE — Patient Instructions (Signed)
 Medication Instructions:   TAKE ATORVASTATIN  (LIPITOR) 40 MG BY MOUTH DAILY  *If you need a refill on your cardiac medications before your next appointment, please call your pharmacy*    Follow-Up: At [facility], you and your health needs are our priority.  As part of our continuing mission to provide you with exceptional heart care, our providers are all part of one team.  This team includes your primary Cardiologist (physician) and Advanced Practice Providers or APPs (Physician Assistants and Nurse Practitioners) who all work together to provide you with the care you need, when you need it.  Your next appointment:   1 year(s)  Provider:   Shelda Bruckner, MD, Rosaline Bane, NP, or Reche Finder, NP

## 2024-03-08 ENCOUNTER — Other Ambulatory Visit: Payer: Self-pay | Admitting: Cardiology

## 2024-03-08 DIAGNOSIS — E7800 Pure hypercholesterolemia, unspecified: Secondary | ICD-10-CM

## 2024-03-11 ENCOUNTER — Other Ambulatory Visit: Payer: Self-pay | Admitting: Family Medicine

## 2024-03-11 DIAGNOSIS — M549 Dorsalgia, unspecified: Secondary | ICD-10-CM

## 2024-03-13 ENCOUNTER — Other Ambulatory Visit: Payer: Self-pay | Admitting: Family Medicine

## 2024-03-13 DIAGNOSIS — I10 Essential (primary) hypertension: Secondary | ICD-10-CM

## 2024-04-17 DIAGNOSIS — K080 Exfoliation of teeth due to systemic causes: Secondary | ICD-10-CM | POA: Diagnosis not present

## 2024-04-20 ENCOUNTER — Other Ambulatory Visit: Payer: Self-pay | Admitting: Family Medicine

## 2024-04-20 DIAGNOSIS — M549 Dorsalgia, unspecified: Secondary | ICD-10-CM

## 2024-05-21 ENCOUNTER — Other Ambulatory Visit: Payer: Self-pay | Admitting: Family Medicine

## 2024-05-21 DIAGNOSIS — M549 Dorsalgia, unspecified: Secondary | ICD-10-CM

## 2024-06-13 ENCOUNTER — Encounter: Payer: Self-pay | Admitting: Family Medicine

## 2024-06-14 NOTE — Telephone Encounter (Signed)
 Patient notified form ready to be pick up  Patient stated will pick up on Monday on appt time

## 2024-06-17 ENCOUNTER — Ambulatory Visit: Admitting: Family Medicine

## 2024-06-17 ENCOUNTER — Encounter: Payer: Self-pay | Admitting: Family Medicine

## 2024-06-17 ENCOUNTER — Ambulatory Visit

## 2024-06-17 VITALS — BP 138/88 | HR 76 | Temp 98.4°F | Ht 69.0 in | Wt 143.8 lb

## 2024-06-17 DIAGNOSIS — M8580 Other specified disorders of bone density and structure, unspecified site: Secondary | ICD-10-CM

## 2024-06-17 DIAGNOSIS — I10 Essential (primary) hypertension: Secondary | ICD-10-CM | POA: Diagnosis not present

## 2024-06-17 DIAGNOSIS — E785 Hyperlipidemia, unspecified: Secondary | ICD-10-CM

## 2024-06-17 DIAGNOSIS — Z0001 Encounter for general adult medical examination with abnormal findings: Secondary | ICD-10-CM

## 2024-06-17 DIAGNOSIS — S22050D Wedge compression fracture of T5-T6 vertebra, subsequent encounter for fracture with routine healing: Secondary | ICD-10-CM | POA: Diagnosis not present

## 2024-06-17 DIAGNOSIS — M549 Dorsalgia, unspecified: Secondary | ICD-10-CM

## 2024-06-17 DIAGNOSIS — R739 Hyperglycemia, unspecified: Secondary | ICD-10-CM | POA: Diagnosis not present

## 2024-06-17 DIAGNOSIS — S22000A Wedge compression fracture of unspecified thoracic vertebra, initial encounter for closed fracture: Secondary | ICD-10-CM | POA: Insufficient documentation

## 2024-06-17 LAB — COMPREHENSIVE METABOLIC PANEL WITH GFR
ALT: 9 U/L (ref 3–53)
AST: 16 U/L (ref 5–37)
Albumin: 4.2 g/dL (ref 3.5–5.2)
Alkaline Phosphatase: 80 U/L (ref 39–117)
BUN: 41 mg/dL — ABNORMAL HIGH (ref 6–23)
CO2: 24 meq/L (ref 19–32)
Calcium: 9.3 mg/dL (ref 8.4–10.5)
Chloride: 96 meq/L (ref 96–112)
Creatinine, Ser: 1.51 mg/dL — ABNORMAL HIGH (ref 0.40–1.50)
GFR: 42.49 mL/min — ABNORMAL LOW
Glucose, Bld: 91 mg/dL (ref 70–99)
Potassium: 4.3 meq/L (ref 3.5–5.1)
Sodium: 130 meq/L — ABNORMAL LOW (ref 135–145)
Total Bilirubin: 1 mg/dL (ref 0.2–1.2)
Total Protein: 7.7 g/dL (ref 6.0–8.3)

## 2024-06-17 LAB — LIPID PANEL
Cholesterol: 105 mg/dL (ref 28–200)
HDL: 63.5 mg/dL
LDL Cholesterol: 33 mg/dL (ref 10–99)
NonHDL: 41.56
Total CHOL/HDL Ratio: 2
Triglycerides: 41 mg/dL (ref 10.0–149.0)
VLDL: 8.2 mg/dL (ref 0.0–40.0)

## 2024-06-17 LAB — CBC
HCT: 29.1 % — ABNORMAL LOW (ref 39.0–52.0)
Hemoglobin: 9.9 g/dL — ABNORMAL LOW (ref 13.0–17.0)
MCHC: 34.1 g/dL (ref 30.0–36.0)
MCV: 87.3 fl (ref 78.0–100.0)
Platelets: 131 K/uL — ABNORMAL LOW (ref 150.0–400.0)
RBC: 3.33 Mil/uL — ABNORMAL LOW (ref 4.22–5.81)
RDW: 15.7 % — ABNORMAL HIGH (ref 11.5–15.5)
WBC: 6 K/uL (ref 4.0–10.5)

## 2024-06-17 LAB — HEMOGLOBIN A1C: Hgb A1c MFr Bld: 4.6 % (ref 4.6–6.5)

## 2024-06-17 LAB — TSH: TSH: 3.52 u[IU]/mL (ref 0.35–5.50)

## 2024-06-17 MED ORDER — TRAMADOL HCL 50 MG PO TABS: 50.0000 mg | ORAL_TABLET | Freq: Three times a day (TID) | ORAL | 0 refills | Status: AC | PRN

## 2024-06-17 NOTE — Assessment & Plan Note (Signed)
Blood pressure at goal today on amlodipine 5 mg daily and losartan 25 mg daily.

## 2024-06-17 NOTE — Assessment & Plan Note (Signed)
 Checking A1c with labs.

## 2024-06-17 NOTE — Assessment & Plan Note (Signed)
 Patient with history of thoracic compression fracture at T6 and T7 previously.  His previous neurosurgeon elected against kyphoplasty due to risk associated with this.  He is concerned about recurrence of compression fracture given his acute onset low back pain several days ago.  We are working this is up as above though he may need to be referred back to neurosurgery for second opinion depending on results of x-ray.

## 2024-06-17 NOTE — Assessment & Plan Note (Signed)
 No red flags.  Patient with significant flareup of back pain several days ago.  He is concerned about recurrent compression fracture.  Does have a previous history for this and has seen neurosurgery however they told him there was not much else they could offer at his most recent visit with him.  We will check plain film today to further evaluate.  Will also give a prescription for tramadol  for pain control.  He has done well with this previously.  Depending on results of x-ray may need to have him follow back up with a different neurosurgeon for second opinion.  We are also addressing his compression fractures as below.

## 2024-06-17 NOTE — Assessment & Plan Note (Signed)
 We ordered repeat DEXA scan a few years ago however this was not completed.  Due to compression fractures would meet criteria for osteoporosis however we will update DEXA scan and likely refer to osteoporosis clinic depending on results.

## 2024-06-17 NOTE — Patient Instructions (Signed)
 It was very nice to see you today!  VISIT SUMMARY: During your visit, we addressed your severe back pain, constipation, and general health maintenance. We discussed potential new vertebral compression fractures and osteopenia, and we have a plan to manage your symptoms and improve your overall health.  YOUR PLAN: SEVERE BACK PAIN: You have severe lower back pain, likely due to a new vertebral compression fracture. -An x-ray of your back is ordered to check for a new fracture. -Tramadol  is prescribed for additional pain relief. -If the x-ray confirms a new fracture, we will consider referring you to a back specialist. -We will discuss a bone density scan to assess your risk of future fractures.  OSTEOPENIA: You may have osteopenia, which is a condition where your bones are weaker than normal. -A bone density scan is ordered to evaluate your bone health and risk of future fractures.  CONSTIPATION: You have chronic constipation, which you have been managing with laxatives and enemas. -Start taking a daily fiber supplement such as Metamucil or Benefiber. -Use Miralax  daily or every other day to maintain regular bowel movements.  GENERAL HEALTH MAINTENANCE: We discussed routine health maintenance, including blood work and vaccinations. -Blood work is ordered for preventative care. -Your vaccinations are confirmed to be up to date.  Return in about 1 year (around 06/17/2025), or if symptoms worsen or fail to improve, for Annual Physical.   Take care, Dr Kennyth  PLEASE NOTE:  If you had any lab tests, please let us  know if you have not heard back within a few days. You may see your results on mychart before we have a chance to review them but we will give you a call once they are reviewed by us .   If we ordered any referrals today, please let us  know if you have not heard from their office within the next week.   If you had any urgent prescriptions sent in today, please check with the  pharmacy within an hour of our visit to make sure the prescription was transmitted appropriately.   Please try these tips to maintain a healthy lifestyle:  Eat at least 3 REAL meals and 1-2 snacks per day.  Aim for no more than 5 hours between eating.  If you eat breakfast, please do so within one hour of getting up.   Each meal should contain half fruits/vegetables, one quarter protein, and one quarter carbs (no bigger than a computer mouse)  Cut down on sweet beverages. This includes juice, soda, and sweet tea.   Drink at least 1 glass of water with each meal and aim for at least 8 glasses per day  Exercise at least 150 minutes every week.    Preventive Care 59 Years and Older, Male Preventive care refers to lifestyle choices and visits with your health care provider that can promote health and wellness. Preventive care visits are also called wellness exams. What can I expect for my preventive care visit? Counseling During your preventive care visit, your health care provider may ask about your: Medical history, including: Past medical problems. Family medical history. History of falls. Current health, including: Emotional well-being. Home life and relationship well-being. Sexual activity. Memory and ability to understand (cognition). Lifestyle, including: Alcohol, nicotine or tobacco, and drug use. Access to firearms. Diet, exercise, and sleep habits. Work and work astronomer. Sunscreen use. Safety issues such as seatbelt and bike helmet use. Physical exam Your health care provider will check your: Height and weight. These may be used to  calculate your BMI (body mass index). BMI is a measurement that tells if you are at a healthy weight. Waist circumference. This measures the distance around your waistline. This measurement also tells if you are at a healthy weight and may help predict your risk of certain diseases, such as type 2 diabetes and high blood pressure. Heart  rate and blood pressure. Body temperature. Skin for abnormal spots. What immunizations do I need?  Vaccines are usually given at various ages, according to a schedule. Your health care provider will recommend vaccines for you based on your age, medical history, and lifestyle or other factors, such as travel or where you work. What tests do I need? Screening Your health care provider may recommend screening tests for certain conditions. This may include: Lipid and cholesterol levels. Diabetes screening. This is done by checking your blood sugar (glucose) after you have not eaten for a while (fasting). Hepatitis C test. Hepatitis B test. HIV (human immunodeficiency virus) test. STI (sexually transmitted infection) testing, if you are at risk. Lung cancer screening. Colorectal cancer screening. Prostate cancer screening. Abdominal aortic aneurysm (AAA) screening. You may need this if you are a current or former smoker. Talk with your health care provider about your test results, treatment options, and if necessary, the need for more tests. Follow these instructions at home: Eating and drinking  Eat a diet that includes fresh fruits and vegetables, whole grains, lean protein, and low-fat dairy products. Limit your intake of foods with high amounts of sugar, saturated fats, and salt. Take vitamin and mineral supplements as recommended by your health care provider. Do not drink alcohol if your health care provider tells you not to drink. If you drink alcohol: Limit how much you have to 0-2 drinks a day. Know how much alcohol is in your drink. In the [place] one drink equals one 12 oz bottle of beer (355 mL), one 5 oz glass of wine (148 mL), or one 1 oz glass of hard liquor (44 mL). Lifestyle Brush your teeth every morning and night with fluoride  toothpaste. Floss one time each day. Exercise for at least 30 minutes 5 or more days each week. Do not use any products that contain nicotine or  tobacco. These products include cigarettes, chewing tobacco, and vaping devices, such as e-cigarettes. If you need help quitting, ask your health care provider. Do not use drugs. If you are sexually active, practice safe sex. Use a condom or other form of protection to prevent STIs. Take aspirin  only as told by your health care provider. Make sure that you understand how much to take and what form to take. Work with your health care provider to find out whether it is safe and beneficial for you to take aspirin  daily. Ask your health care provider if you need to take a cholesterol-lowering medicine (statin). Find healthy ways to manage stress, such as: Meditation, yoga, or listening to music. Journaling. Talking to a trusted person. Spending time with friends and family. Safety Always wear your seat belt while driving or riding in a vehicle. Do not drive: If you have been drinking alcohol. Do not ride with someone who has been drinking. When you are tired or distracted. While texting. If you have been using any mind-altering substances or drugs. Wear a helmet and other protective equipment during sports activities. If you have firearms in your house, make sure you follow all gun safety procedures. Minimize exposure to UV radiation to reduce your risk of skin cancer. What's  next? Visit your health care provider once a year for an annual wellness visit. Ask your health care provider how often you should have your eyes and teeth checked. Stay up to date on all vaccines. This information is not intended to replace advice given to you by your health care provider. Make sure you discuss any questions you have with your health care provider. Document Revised: 12/02/2020 Document Reviewed: 12/02/2020 Elsevier Patient Education  2024 [employer].

## 2024-06-17 NOTE — Assessment & Plan Note (Signed)
 Check lipids with labs.  He is on Lipitor 40 mg daily.

## 2024-06-17 NOTE — Progress Notes (Signed)
 "  Chief Complaint:  Jeffrey [name] is a 83 y.o. male who presents today for his annual comprehensive physical exam.    Assessment/Plan:  New/Acute Problems: Constipation  No red flags.  Symptoms are improving with recent use of laxatives.  We discussed importance of maintaining adequate oral hydration.  Also discussed fiber supplementation and MiraLAX  as needed to have at least 1-2 soft bowel movements daily.  Chronic Problems Addressed Today: Back pain No red flags.  Patient with significant flareup of back pain several days ago.  He is concerned about recurrent compression fracture.  Does have a previous history for this and has seen neurosurgery however they told him there was not much else they could offer at his most recent visit with him.  We will check plain film today to further evaluate.  Will also give a prescription for tramadol  for pain control.  He has done well with this previously.  Depending on results of x-ray may need to have him follow back up with a different neurosurgeon for second opinion.  We are also addressing his compression fractures as below.  Essential hypertension Blood pressure at goal today on amlodipine  5 mg daily and losartan  25 mg daily.  HLD (hyperlipidemia) Check lipids with labs.  He is on Lipitor 40 mg daily.  Hyperglycemia Checking A1c with labs.  Thoracic compression fracture [facility]) Patient with history of thoracic compression fracture at T6 and T7 previously.  His previous neurosurgeon elected against kyphoplasty due to risk associated with this.  He is concerned about recurrence of compression fracture given his acute onset low back pain several days ago.  We are working this is up as above though he may need to be referred back to neurosurgery for second opinion depending on results of x-ray.  Osteopenia We ordered repeat DEXA scan a few years ago however this was not completed.  Due to compression fractures would meet criteria for osteoporosis  however we will update DEXA scan and likely refer to osteoporosis clinic depending on results.   Preventative Healthcare: Check labs.  Up-to-date on vaccines.    Patient Counseling(The following topics were reviewed and/or handout was given):  -Nutrition: Stressed importance of moderation in sodium/caffeine intake, saturated fat and cholesterol, caloric balance, sufficient intake of fresh fruits, vegetables, and fiber.  -Stressed the importance of regular exercise.   -Substance Abuse: Discussed cessation/primary prevention of tobacco, alcohol, or other drug use; driving or other dangerous activities under the influence; availability of treatment for abuse.   -Injury prevention: Discussed safety belts, safety helmets, smoke detector, smoking near bedding or upholstery.   -Sexuality: Discussed sexually transmitted diseases, partner selection, use of condoms, avoidance of unintended pregnancy and contraceptive alternatives.   -Dental health: Discussed importance of regular tooth brushing, flossing, and dental visits.  -Health maintenance and immunizations reviewed. Please refer to Health maintenance section.  Return to care in 1 year for next preventative visit.     Subjective:  HPI:  He has no acute complaints today. Patient is here today for his annual physical.  See assessment / plan for status of chronic conditions.  Discussed the use of AI scribe software for clinical note transcription with the patient, who gave verbal consent to proceed.  History of Present Illness Jeffrey [name] is a 83 year old male who presents with severe back pain and constipation.  He has been experiencing severe back pain that began two Saturdays ago when he woke up and sat up in bed, describing it as 'incredible pain.'  The pain is located in the lower back and radiates around the area. He has a history of two fractured vertebrae from a fall 20 years ago. He recalls that after x-rays and MRI, the back  specialist told him that it was too dangerous to try to give him the concrete filler for his collapsed vertebrae, and that it should get better, but it did not improve. He is currently taking meloxicam  for pain, which provides minimal relief. He recalls being prescribed tramadol  last year but does not remember its effectiveness.  He has been dealing with constipation, which he managed with laxatives and enemas, resulting in a 'halfway decent bowel movement' at 4 AM today. He typically uses over-the-counter medications like Colace or Docusate, which usually work for him. He has not tried fiber supplements like Metamucil or Benefiber.  His current medications include meloxicam  for pain, amlodipine  for blood pressure, and atorvastatin  for cholesterol. He is cautious about flu exposure, using hand sanitizer after touching public surfaces and avoiding close contact with others.      06/17/2024   11:03 AM  Depression screen PHQ 2/9  Decreased Interest 0  Down, Depressed, Hopeless 0  PHQ - 2 Score 0    Health Maintenance Due  Topic Date Due   Medicare Annual Wellness (AWV)  07/12/2024     ROS: Per HPI, otherwise a complete review of systems was negative.   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   Right Buccal Cheek(MOHS)   Nodular basal cell carcinoma (BCC)    Left lower leg, anterior treated 08/17/23 w Mohs   Osteopenia    Patient Active Problem List   Diagnosis Date Noted   Thoracic compression fracture (HCC) 06/17/2024   Personal history of malignant neoplasm of tongue  01/28/2024   Impacted cerumen of both ears 07/30/2023   Personal history of malignant neoplasm of unspecified site of lip, oral cavity, and pharynx 07/30/2023   Back pain 05/31/2023   Intermittent claudication 04/17/2023   Mucositis due to antineoplastic therapy 11/26/2020   Drug induced constipation 11/26/2020   Normocytic normochromic anemia 11/19/2020   Port-A-Cath in place 11/09/2020   History of drug allergy 11/09/2020   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   Hyperglycemia 04/09/2020   Chronic neck pain 04/09/2020   Coronary artery disease due to calcified coronary lesion 12/27/2018   Osteopenia 02/24/2017   Abdominal aortic aneurysm (AAA) without rupture 07/06/2015   Basal cell carcinoma 07/06/2015   Essential hypertension 07/06/2015   History of colon polyps 07/06/2015   HLD (hyperlipidemia) 07/06/2015   Past Surgical History:  Procedure Laterality Date   ABDOMINAL AORTIC ENDOVASCULAR FENESTRATED STENT GRAFT N/A 03/21/2018   Procedure: ABDOMINAL AORTIC ENDOVASCULAR FENESTRATED STENT GRAFT ; DYNA CT PERFORMED;  Surgeon: Serene Gaile ORN, MD;  Location: [facility] OR;  Service: Vascular;  Laterality: N/A;   Basal Skin Cancer  2012   CATARACT EXTRACTION Bilateral    IR GASTROSTOMY TUBE MOD SED  11/06/2020   IR GASTROSTOMY TUBE REMOVAL  04/19/2021   IR IMAGING GUIDED  PORT INSERTION  11/06/2020   IR REMOVAL TUN ACCESS W/ PORT W/O FL MOD SED  07/15/2021   MASS BIOPSY Right 09/25/2020   Procedure: OPEN NECK MASS BIOPSY;  Surgeon: Karis Clunes, MD;  Location: [facility];  Service: ENT;  Laterality: Right;   MASS EXCISION     x 2; throat and above lip   TONSILLECTOMY     TRANSURETHRAL RESECTION OF PROSTATE      Family History  Problem Relation Age of Onset   Diabetes Mother    Hypertension Mother    Stroke Mother    Alcohol abuse Father    Heart attack Father    Heart disease Father    Hypertension Father    Hyperlipidemia Sister    Pancreatic cancer  Sister    Pancreatic cancer Brother    Colon cancer Paternal Grandmother 54   Hypertension Son    Esophageal cancer Neg Hx    Stomach cancer Neg Hx    Liver disease Neg Hx     Medications- reviewed and updated Current Outpatient Medications  Medication Sig Dispense Refill   amLODipine  (NORVASC ) 5 MG tablet Take 1 tablet (5 mg total) by mouth daily. 1st attempt. Pt needs yearly appointment for # 90 day supply or any future refills. Please call office to schedule appointment. 90 tablet 3   atorvastatin  (LIPITOR) 40 MG tablet Take 1 tablet (40 mg total) by mouth daily. 90 tablet 3   losartan  (COZAAR ) 25 MG tablet TAKE 1 TABLET(25 MG) BY MOUTH TWICE DAILY 180 tablet 1   meloxicam  (MOBIC ) 15 MG tablet TAKE 1 TABLET(15 MG) BY MOUTH DAILY 30 tablet 0   traMADol  (ULTRAM ) 50 MG tablet Take 1 tablet (50 mg total) by mouth every 8 (eight) hours as needed for up to 5 days. 15 tablet 0   No current facility-administered medications for this visit.    Allergies-reviewed and updated Allergies[1]  Social History   Socioeconomic History   Marital status: Divorced    Spouse name: Not on file   Number of children: 1   Years of education: Not on file   Highest education level: Associate degree: academic program  Occupational History   Occupation: Retired    Associate Professor: [employer]  Tobacco Use   Smoking status: Former    Current packs/day: 0.00    Average packs/day: 1 pack/day for 32.0 years (32.0 ttl pk-yrs)    Types: Cigarettes    Start date: 08/17/1958    Quit date: 08/17/1990    Years since quitting: 33.8    Passive exposure: Never   Smokeless tobacco: Never  Vaping Use   Vaping status: Never Used  Substance and Sexual Activity   Alcohol use: Not Currently    Comment: Occassionally, when I feel like it   Drug use: No   Sexual activity: Yes    Partners: Female  Other Topics Concern   Not on file  Social History Narrative   Not on file   Social Drivers of Health   Tobacco  Use: Medium Risk (06/17/2024)   Patient History    Smoking Tobacco Use: Former    Smokeless Tobacco Use: Never    Passive Exposure: Never  Physicist, Medical Strain: Low Risk (06/13/2024)   Overall Financial Resource Strain (CARDIA)    Difficulty of Paying Living Expenses: Not hard at all  Food Insecurity: No Food Insecurity (06/13/2024)   Epic    Worried About Running Out of Food in the Last Year: Never true  Ran Out of Food in the Last Year: Never true  Transportation Needs: No Transportation Needs (06/13/2024)   Epic    Lack of Transportation (Medical): No    Lack of Transportation (Non-Medical): No  Physical Activity: Inactive (06/13/2024)   Exercise Vital Sign    Days of Exercise per Week: 0 days    Minutes of Exercise per Session: Not on file  Stress: No Stress Concern Present (06/13/2024)   [employer] of Occupational Health - Occupational Stress Questionnaire    Feeling of Stress: Only a little  Social Connections: Socially Isolated (06/13/2024)   Social Connection and Isolation Panel    Frequency of Communication with Friends and Family: Twice a week    Frequency of Social Gatherings with Friends and Family: Once a week    Attends Religious Services: Never    Database Administrator or Organizations: No    Attends Engineer, Structural: Not on file    Marital Status: Divorced  Depression (PHQ2-9): Low Risk (06/17/2024)   Depression (PHQ2-9)    PHQ-2 Score: 0  Alcohol Screen: Low Risk (06/13/2024)   Alcohol Screen    Last Alcohol Screening Score (AUDIT): 1  Housing: Low Risk (06/13/2024)   Epic    Unable to Pay for Housing in the Last Year: No    Number of Times Moved in the Last Year: 0    Homeless in the Last Year: No  Utilities: Not At Risk (07/13/2023)   AHC Utilities    Threatened with loss of utilities: No  Health Literacy: Adequate Health Literacy (07/13/2023)   B1300 Health Literacy    Frequency of need for help with medical instructions:  Never        Objective:  Physical Exam: BP 138/88   Pulse 76   Temp 98.4 F (36.9 C) (Temporal)   Ht 5' 9 (1.753 m)   Wt 143 lb 12.8 oz (65.2 kg)   SpO2 99%   BMI 21.24 kg/m   Body mass index is 21.24 kg/m. Wt Readings from Last 3 Encounters:  06/17/24 143 lb 12.8 oz (65.2 kg)  03/07/24 143 lb (64.9 kg)  03/04/24 140 lb 9.6 oz (63.8 kg)   Gen: NAD, resting comfortably HEENT: TMs normal bilaterally. OP clear. No thyromegaly noted.  CV: RRR with no murmurs appreciated Pulm: NWOB, CTAB with no crackles, wheezes, or rhonchi GI: Normal bowel sounds present. Soft, Nontender, Nondistended. MSK: no edema, cyanosis, or clubbing noted - Back: Tenderness palpation along upper lumbar area.  Neurovascular intact distally. Skin: warm, dry Neuro: CN2-12 grossly intact. Strength 5/5 in upper and lower extremities. Reflexes symmetric and intact bilaterally.  Psych: Normal affect and thought content     Manolo Bosket M. Kennyth, MD 06/17/2024 11:31 AM     [1]  Allergies Allergen Reactions   Ketotifen Fumarate Swelling and Other (See Comments)    Eye swelling   Mixed Ragweed     Hay fever   Triamcinolone Hives and Rash   "

## 2024-06-18 ENCOUNTER — Ambulatory Visit: Payer: Self-pay | Admitting: Family Medicine

## 2024-06-18 DIAGNOSIS — E860 Dehydration: Secondary | ICD-10-CM

## 2024-06-18 NOTE — Progress Notes (Signed)
 His xray confirms new compression fractures in his thoracic spine. Recommend referral to orthopedics to discuss management options.

## 2024-06-18 NOTE — Progress Notes (Signed)
 His kidney numbers are up a bit. This indicated dehydration. Recommend he push fluids and come back to recheck in a week  The rest of his blood work is all stable. We can recheck in 6-12 months.

## 2024-06-20 ENCOUNTER — Other Ambulatory Visit: Payer: Self-pay | Admitting: Family Medicine

## 2024-06-20 DIAGNOSIS — M549 Dorsalgia, unspecified: Secondary | ICD-10-CM

## 2024-06-21 NOTE — Addendum Note (Signed)
 Addended by: Anaily Ashbaugh, CREE A on: 06/21/2024 10:01 AM   Modules accepted: Orders

## 2024-06-21 NOTE — Telephone Encounter (Signed)
 Patient has been scheduled lab only appt on 06/25/24 @ 9:45 am. Please adjust lab to be placed as future. Thanks.

## 2024-06-24 ENCOUNTER — Encounter: Payer: Self-pay | Admitting: Family Medicine

## 2024-06-24 DIAGNOSIS — M549 Dorsalgia, unspecified: Secondary | ICD-10-CM

## 2024-06-24 NOTE — Telephone Encounter (Signed)
 We can refer him over to the osteoporosis clinic with Ronal Dragon Persons.

## 2024-06-25 ENCOUNTER — Other Ambulatory Visit

## 2024-06-25 DIAGNOSIS — E860 Dehydration: Secondary | ICD-10-CM

## 2024-06-25 LAB — COMPREHENSIVE METABOLIC PANEL WITH GFR
ALT: 12 U/L (ref 3–53)
AST: 23 U/L (ref 5–37)
Albumin: 4 g/dL (ref 3.5–5.2)
Alkaline Phosphatase: 87 U/L (ref 39–117)
BUN: 33 mg/dL — ABNORMAL HIGH (ref 6–23)
CO2: 26 meq/L (ref 19–32)
Calcium: 9.1 mg/dL (ref 8.4–10.5)
Chloride: 93 meq/L — ABNORMAL LOW (ref 96–112)
Creatinine, Ser: 1.44 mg/dL (ref 0.40–1.50)
GFR: 44.98 mL/min — ABNORMAL LOW
Glucose, Bld: 94 mg/dL (ref 70–99)
Potassium: 4.6 meq/L (ref 3.5–5.1)
Sodium: 126 meq/L — ABNORMAL LOW (ref 135–145)
Total Bilirubin: 0.9 mg/dL (ref 0.2–1.2)
Total Protein: 7.6 g/dL (ref 6.0–8.3)

## 2024-06-26 ENCOUNTER — Ambulatory Visit: Payer: Self-pay | Admitting: Family Medicine

## 2024-06-26 ENCOUNTER — Other Ambulatory Visit: Payer: Self-pay | Admitting: *Deleted

## 2024-06-26 DIAGNOSIS — M8580 Other specified disorders of bone density and structure, unspecified site: Secondary | ICD-10-CM

## 2024-06-26 DIAGNOSIS — R799 Abnormal finding of blood chemistry, unspecified: Secondary | ICD-10-CM

## 2024-06-26 MED ORDER — MELOXICAM 15 MG PO TABS: 15.0000 mg | ORAL_TABLET | Freq: Every day | ORAL | 0 refills | Status: DC

## 2024-06-26 MED ORDER — TRAMADOL HCL 50 MG PO TABS: 50.0000 mg | ORAL_TABLET | Freq: Three times a day (TID) | ORAL | 5 refills | Status: AC | PRN

## 2024-06-26 NOTE — Telephone Encounter (Signed)
 osteoporosis clinic order placed

## 2024-06-26 NOTE — Telephone Encounter (Signed)
 Please advise

## 2024-06-26 NOTE — Progress Notes (Signed)
 Kidney numbers are improving though still not quite back to his baseline.  He should continue to push fluids and we can recheck again in 1 month.  Please place future order for bmet.

## 2024-07-04 ENCOUNTER — Other Ambulatory Visit: Payer: Self-pay | Admitting: Physician Assistant

## 2024-07-04 ENCOUNTER — Ambulatory Visit: Admitting: Physician Assistant

## 2024-07-04 ENCOUNTER — Encounter: Payer: Self-pay | Admitting: Physician Assistant

## 2024-07-04 VITALS — Ht 67.0 in | Wt 130.8 lb

## 2024-07-04 DIAGNOSIS — M810 Age-related osteoporosis without current pathological fracture: Secondary | ICD-10-CM

## 2024-07-04 NOTE — Progress Notes (Signed)
 "  Office Visit Note   Patient: Jeffrey [name]           Date of Birth: [birth year]           MRN: [id number] Visit Date: 07/04/2024              Requested by: Kennyth Worth HERO, MD [address] [place],  [place] [address] PCP: Kennyth Worth HERO, MD   Assessment & Plan: Visit Diagnoses:  1. 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 Kennyth for evaluation of osteoporosis.  Unfortunately he has sustained several compression fractures in his thoracic spine the most recent being in sometime before December 2025.  This was diagnosed as a T12 compression fracture but also has fractures of T8-9 and 10.  He has lost height and has had significant pain.  Had no trauma preceding these.  He does not take any medications for osteoporosis.  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  He is currently completed treatment and just goes for annual checkups.  Does does have some decreased kidney function with a GFR of in the low 40s.  No history of ulcers or gastric bypass surgery no reflux.  No history of epilepsy or seizures he does not know how much calcium  he takes as he takes a generalized Centrum vitamin.  He thinks there is vitamin D  in that as well.  He was a former smoker for 30 years he does not consume alcoholic beverages he was walking daily but because of his back has been unable to do so.  No major dental work no history of family fragility fracture.  By definition he is osteoporotic.  His thoracic spine has multiple compression fractures he also was noted to have osteopenia even on the x-ray findings.  He does have a bone density scan answered but this would be just a baseline as by definition he has osteoporosis.  We talked about lifestyle changes he eats out quite a bit and thinks tracking his calcium  would be difficult.  Will look into making sure he is getting 1200 mg of  calcium  a day we will see how much is in his supplement and then supplement from there.  With regards to vitamin D  we will draw vitamin D  today then we can get a baseline and see what he needs to take.  As far as activity this is difficult as well.  He said he used to do tai chi he enjoyed that we could he could look into a Silver  sneakers program for that or a general Silver  sneakers program.  I have also offered him physical therapy to help with his balance as he is having issues a cane right now.  Reviewed the risks of medication as well as the side effects.  From a treatment standpoint really the only medication for him would be Prolia or Jubbonti.  He is not really a candidate for any other medication.  We will go forward and try and get this authorized.  I encouraged him to contact me with any questions as he felt a bit overwhelmed with all the information I provided to him today.  45 minutes was spent reviewing his chart and speaking with him today.  We will draw vitamin D  today.  Given his multiple fractures in his spine we will also draw a SPEP.  Will contact him with any results  that need to follow-up  Follow-Up Instructions: No follow-ups on file.   Orders:  No orders of the defined types were placed in this encounter.  No orders of the defined types were placed in this encounter.     Procedures: No procedures performed   Clinical Data: No additional findings.   Subjective: No chief complaint on file.   HPI pleasant 84 year old gentleman referred by Dr. Kennyth for evaluation of osteoporosis  Review of Systems  All other systems reviewed and are negative.    Objective: Vital Signs: There were no vitals taken for this visit.  Physical Exam Constitutional:      Appearance: Normal appearance.  Pulmonary:     Effort: Pulmonary effort is normal.  Skin:    General: Skin is warm and dry.  Neurological:     General: No focal deficit present.     Mental Status: He is alert  and oriented to person, place, and time.       Specialty Comments:  No specialty comments available.  Imaging: No results found.   PMFS History: Patient Active Problem List   Diagnosis Date Noted   Age-related osteoporosis without current pathological fracture 07/04/2024   Thoracic compression fracture (HCC) 06/17/2024   Personal history of malignant neoplasm of tongue 01/28/2024   Impacted cerumen of both ears 07/30/2023   Personal history of malignant neoplasm of unspecified site of lip, oral cavity, and pharynx 07/30/2023   Back pain 05/31/2023   Intermittent claudication 04/17/2023   Mucositis due to antineoplastic therapy 11/26/2020   Drug induced constipation 11/26/2020   Normocytic normochromic anemia 11/19/2020   Port-A-Cath in place 11/09/2020   History of drug allergy 11/09/2020   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   Hyperglycemia 04/09/2020   Chronic neck pain 04/09/2020   Coronary artery disease due to calcified coronary lesion 12/27/2018   Osteopenia 02/24/2017   Abdominal aortic aneurysm (AAA) without rupture 07/06/2015   Basal cell carcinoma 07/06/2015   Essential hypertension 07/06/2015   History of colon polyps 07/06/2015   HLD (hyperlipidemia) 07/06/2015   Past Medical History:  Diagnosis Date   AAA (abdominal aortic aneurysm)    Allergy    Arthritis    Basal cell carcinoma 01/04/2024   Right abdomen(side) - needs excision   Colon polyps    Coronary artery calcification    Eczematous dermatitis    GERD (gastroesophageal reflux disease)    Hypertension    Iliac artery stenosis, left    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   Right Buccal Cheek(MOHS)   Nodular basal cell carcinoma (BCC)    Left lower leg, anterior treated 08/17/23 w Mohs   Osteopenia     Family History  Problem Relation Age of Onset   Diabetes Mother    Hypertension Mother    Stroke Mother    Alcohol abuse Father    Heart attack Father     Heart disease Father    Hypertension Father    Hyperlipidemia Sister    Pancreatic cancer Sister    Pancreatic cancer Brother    Colon cancer Paternal Grandmother 60   Hypertension Son    Esophageal cancer Neg Hx    Stomach cancer Neg Hx    Liver disease Neg Hx     Past Surgical History:  Procedure Laterality Date   ABDOMINAL AORTIC ENDOVASCULAR FENESTRATED STENT GRAFT N/A 03/21/2018   Procedure: ABDOMINAL AORTIC ENDOVASCULAR FENESTRATED STENT GRAFT ; DYNA CT PERFORMED;  Surgeon: Serene Gaile ORN, MD;  Location:  [facility] OR;  Service: Vascular;  Laterality: N/A;   Basal Skin Cancer  2012   CATARACT EXTRACTION Bilateral    IR GASTROSTOMY TUBE MOD SED  11/06/2020   IR GASTROSTOMY TUBE REMOVAL  04/19/2021   IR IMAGING GUIDED PORT INSERTION  11/06/2020   IR REMOVAL TUN ACCESS W/ PORT W/O FL MOD SED  07/15/2021   MASS BIOPSY Right 09/25/2020   Procedure: OPEN NECK MASS BIOPSY;  Surgeon: Karis Clunes, MD;  Location: [facility];  Service: ENT;  Laterality: Right;   MASS EXCISION     x 2; throat and above lip   TONSILLECTOMY     TRANSURETHRAL RESECTION OF PROSTATE     Social History   Occupational History   Occupation: Retired    Associate Professor: [employer]  Tobacco Use   Smoking status: Former    Current packs/day: 0.00    Average packs/day: 1 pack/day for 32.0 years (32.0 ttl pk-yrs)    Types: Cigarettes    Start date: 08/17/1958    Quit date: 08/17/1990    Years since quitting: 33.9    Passive exposure: Never   Smokeless tobacco: Never  Vaping Use   Vaping status: Never Used  Substance and Sexual Activity   Alcohol use: Not Currently    Comment: Occassionally, when I feel like it   Drug use: No   Sexual activity: Yes    Partners: Female        "

## 2024-07-09 LAB — PROTEIN ELECTROPHORESIS, SERUM
Albumin ELP: 3.9 g/dL (ref 3.8–4.8)
Alpha 1: 0.4 g/dL — ABNORMAL HIGH (ref 0.2–0.3)
Alpha 2: 0.8 g/dL (ref 0.5–0.9)
Beta 2: 0.4 g/dL (ref 0.2–0.5)
Beta Globulin: 0.4 g/dL (ref 0.4–0.6)
Gamma Globulin: 1.7 g/dL (ref 0.8–1.7)
Total Protein: 7.6 g/dL (ref 6.1–8.1)

## 2024-07-09 LAB — VITAMIN D 25 HYDROXY (VIT D DEFICIENCY, FRACTURES): Vit D, 25-Hydroxy: 32 ng/mL (ref 30–100)

## 2024-07-09 LAB — HOUSE ACCOUNT TRACKING

## 2024-07-11 ENCOUNTER — Ambulatory Visit: Admitting: Dermatology

## 2024-07-11 VITALS — BP 136/80

## 2024-07-11 DIAGNOSIS — D1801 Hemangioma of skin and subcutaneous tissue: Secondary | ICD-10-CM | POA: Diagnosis not present

## 2024-07-11 DIAGNOSIS — Z1283 Encounter for screening for malignant neoplasm of skin: Secondary | ICD-10-CM | POA: Diagnosis not present

## 2024-07-11 DIAGNOSIS — L820 Inflamed seborrheic keratosis: Secondary | ICD-10-CM | POA: Diagnosis not present

## 2024-07-11 DIAGNOSIS — W908XXA Exposure to other nonionizing radiation, initial encounter: Secondary | ICD-10-CM | POA: Diagnosis not present

## 2024-07-11 DIAGNOSIS — L814 Other melanin hyperpigmentation: Secondary | ICD-10-CM

## 2024-07-11 DIAGNOSIS — D485 Neoplasm of uncertain behavior of skin: Secondary | ICD-10-CM

## 2024-07-11 DIAGNOSIS — Z85828 Personal history of other malignant neoplasm of skin: Secondary | ICD-10-CM

## 2024-07-11 DIAGNOSIS — L578 Other skin changes due to chronic exposure to nonionizing radiation: Secondary | ICD-10-CM

## 2024-07-11 DIAGNOSIS — D0439 Carcinoma in situ of skin of other parts of face: Secondary | ICD-10-CM

## 2024-07-11 DIAGNOSIS — L821 Other seborrheic keratosis: Secondary | ICD-10-CM

## 2024-07-11 NOTE — Patient Instructions (Signed)

## 2024-07-11 NOTE — Progress Notes (Signed)
 "  Follow-Up Visit   Subjective  Jeffrey [name] is a 84 y.o. male who presents for the following: Skin Cancer Screening and Upper Body Skin Exam Last OV 01/03/2025 - [place] follow up of left temple and left mandble treated with LN2 at last appointment. Biopsy proven BCC of right abdomen (biopsy at last appointment) - Excised by Dr Corey 02/13/2024. He has a spot that he would like checked on his left sideburn that was cut at his last haircut. -   The patient presents for Upper Body Skin Exam (UBSE) for skin cancer screening and mole check. The patient has spots, moles and lesions to be evaluated, some may be new or changing and the patient may have concern these could be cancer.    The following portions of the chart were reviewed this encounter and updated as appropriate: medications, allergies, medical history  Review of Systems:  No other skin or systemic complaints except as noted in HPI or Assessment and Plan.  Objective  Well appearing patient in no apparent distress; mood and affect are within normal limits.  All skin waist up examined. Relevant physical exam findings are noted in the Assessment and Plan.  Left sidburn 1.0 cm crusted papule  Left Posterior Mandible Erythematous stuck-on, waxy papule or plaque  Assessment & Plan   Neoplasm of uncertain behavior of skin, left temple A 1 cm crusted papule on the left temple requires biopsy to rule out skin cancer due to his history of skin cancer. - Performed biopsy of the left temple lesion - Will call with biopsy results - If biopsy indicates skin cancer, will refer to Dr. Steffan for further management  Inflamed seborrheic keratosis A benign seborrheic keratosis on the jawline requires cryotherapy to prevent snagging. - Advised to keep the area covered with Aquaphor or Vaseline and a Band-Aid for one week to aid healing NEOPLASM OF UNCERTAIN BEHAVIOR OF SKIN Left sidburn - Skin / nail biopsy Type of biopsy: tangential   Informed  consent: discussed and consent obtained   Timeout: patient name, date of birth, surgical site, and procedure verified   Procedure prep:  Patient was prepped and draped in usual sterile fashion Prep type:  Isopropyl alcohol Anesthesia: the lesion was anesthetized in a standard fashion   Anesthetic:  1% lidocaine  w/ epinephrine  1-100,000 buffered w/ 8.4% NaHCO3 Instrument used: flexible razor blade   Hemostasis achieved with: pressure, aluminum chloride and electrodesiccation   Outcome: patient tolerated procedure well   Post-procedure details: sterile dressing applied and wound care instructions given   Dressing type: bandage and petrolatum    Specimen 1 - Surgical pathology Differential Diagnosis: R/O NMSC  Check Margins: No INFLAMED SEBORRHEIC KERATOSIS Left Posterior Mandible Symptomatic, irritating, patient would like treated.  Benign-appearing.  Call clinic for new or changing lesions.   - Destruction of lesion - Left Posterior Mandible Complexity: simple   Destruction method: cryotherapy   Informed consent: discussed and consent obtained   Timeout:  patient name, date of birth, surgical site, and procedure verified Lesion destroyed using liquid nitrogen: Yes   Region frozen until ice ball extended beyond lesion: Yes   Outcome: patient tolerated procedure well with no complications   Post-procedure details: wound care instructions given    Skin cancer screening performed today.  Actinic Damage - Chronic condition, secondary to cumulative UV/sun exposure - diffuse scaly erythematous macules with underlying dyspigmentation - Recommend daily broad spectrum sunscreen SPF 30+ to sun-exposed areas, reapply every 2 hours as needed.  -  Staying in the shade or wearing long sleeves, sun glasses (UVA+UVB protection) and wide brim hats (4-inch brim around the entire circumference of the hat) are also recommended for sun protection.  - Call for new or changing lesions.  Lentigines,  Seborrheic Keratoses, Hemangiomas - Benign normal skin lesions - Benign-appearing - Call for any changes  Melanocytic Nevi - Tan-brown and/or pink-flesh-colored symmetric macules and papules - Benign appearing on exam today - Observation - Call clinic for new or changing moles - Recommend daily use of broad spectrum spf 30+ sunscreen to sun-exposed areas.   HISTORY OF BASAL CELL CARCINOMA OF THE SKIN - No evidence of recurrence today - Recommend regular full body skin exams - Recommend daily broad spectrum sunscreen SPF 30+ to sun-exposed areas, reapply every 2 hours as needed.  - Call if any new or changing lesions are noted between office visits   Return in about 10 months (around 05/11/2025).  I, Roseline Hutchinson, CMA, am acting as scribe for [employer], DO .   Documentation: I have reviewed the above documentation for accuracy and completeness, and I agree with the above.  Delon Lenis, DO    "

## 2024-07-11 NOTE — Progress Notes (Deleted)
" ° °  Follow-Up Visit   Subjective  Jeffrey [name] is a 84 y.o. male who presents for the following: Last OV 01/03/2025 - [place] follow up of left temple and left mandble treated with LN2 at last appointment. Biopsy proven BCC of right abdomen (biopsy at last appointment) - Excised by Dr Corey 02/13/2024. He has a spot that he would like checked on his left sideburn that was cut at his last haircut.   The following portions of the chart were reviewed this encounter and updated as appropriate: medications, allergies, medical history  Review of Systems:  No other skin or systemic complaints except as noted in HPI or Assessment and Plan.  Objective  Well appearing patient in no apparent distress; mood and affect are within normal limits.  ***A full examination was performed including scalp, head, eyes, ears, nose, lips, neck, chest, axillae, abdomen, back, buttocks, bilateral upper extremities, bilateral lower extremities, hands, feet, fingers, toes, fingernails, and toenails. All findings within normal limits unless otherwise noted below.  ***A focused examination was performed of the following areas: ***  Relevant exam findings are noted in the Assessment and Plan.    Assessment & Plan       No follow-ups on file.  ***  Documentation: I have reviewed the above documentation for accuracy and completeness, and I agree with the above.  Delon Lenis, DO    "

## 2024-07-12 LAB — SURGICAL PATHOLOGY

## 2024-07-15 ENCOUNTER — Encounter: Payer: Self-pay | Admitting: Dermatology

## 2024-07-17 ENCOUNTER — Encounter: Payer: Self-pay | Admitting: Dermatology

## 2024-07-17 ENCOUNTER — Ambulatory Visit: Payer: Self-pay | Admitting: Dermatology

## 2024-07-17 DIAGNOSIS — D099 Carcinoma in situ, unspecified: Secondary | ICD-10-CM | POA: Insufficient documentation

## 2024-07-17 NOTE — Progress Notes (Signed)
 Hi Jeffrey [name],  Please call pt to inform of results and treatment plan.   He can be scheduled in a regular apt slot  Thanks   1. Skin, left sideburn :  ED&C and 5-FU      SQUAMOUS CELL CARCINOMA IN SITU, ASSOCIATED WITH VERRUCA

## 2024-07-23 ENCOUNTER — Other Ambulatory Visit: Payer: Self-pay | Admitting: Family Medicine

## 2024-07-23 DIAGNOSIS — M549 Dorsalgia, unspecified: Secondary | ICD-10-CM

## 2024-08-02 ENCOUNTER — Ambulatory Visit (INDEPENDENT_AMBULATORY_CARE_PROVIDER_SITE_OTHER): Admitting: Otolaryngology

## 2024-08-09 ENCOUNTER — Ambulatory Visit: Admitting: Dermatology

## 2025-02-21 ENCOUNTER — Ambulatory Visit: Admitting: Hematology and Oncology

## 2025-02-21 ENCOUNTER — Other Ambulatory Visit

## 2025-05-01 ENCOUNTER — Ambulatory Visit: Admitting: Dermatology

## 2025-06-26 ENCOUNTER — Encounter: Admitting: Family Medicine
